# Patient Record
Sex: Male | Born: 1950 | Race: White | Hispanic: No | Marital: Married | Smoking: Never smoker
Health system: Southern US, Community
[De-identification: ages and names within clinical notes are randomized; demographics above are authoritative.]

## PROBLEM LIST (undated history)

## (undated) DIAGNOSIS — S069X9A Unspecified intracranial injury with loss of consciousness of unspecified duration, initial encounter: Secondary | ICD-10-CM

## (undated) DIAGNOSIS — H209 Unspecified iridocyclitis: Secondary | ICD-10-CM

## (undated) DIAGNOSIS — N138 Other obstructive and reflux uropathy: Principal | ICD-10-CM

## (undated) DIAGNOSIS — G3184 Mild cognitive impairment, so stated: Secondary | ICD-10-CM

## (undated) DIAGNOSIS — I219 Acute myocardial infarction, unspecified: Secondary | ICD-10-CM

## (undated) DIAGNOSIS — E782 Mixed hyperlipidemia: Secondary | ICD-10-CM

## (undated) DIAGNOSIS — N529 Male erectile dysfunction, unspecified: Secondary | ICD-10-CM

## (undated) DIAGNOSIS — Z79899 Other long term (current) drug therapy: Secondary | ICD-10-CM

## (undated) DIAGNOSIS — R41844 Frontal lobe and executive function deficit: Secondary | ICD-10-CM

## (undated) DIAGNOSIS — R6882 Decreased libido: Secondary | ICD-10-CM

## (undated) DIAGNOSIS — F068 Other specified mental disorders due to known physiological condition: Secondary | ICD-10-CM

## (undated) DIAGNOSIS — N509 Disorder of male genital organs, unspecified: Secondary | ICD-10-CM

## (undated) DIAGNOSIS — M659 Unspecified synovitis and tenosynovitis, unspecified site: Secondary | ICD-10-CM

## (undated) DIAGNOSIS — H2013 Chronic iridocyclitis, bilateral: Secondary | ICD-10-CM

## (undated) DIAGNOSIS — N508 Other specified disorders of male genital organs: Secondary | ICD-10-CM

## (undated) DIAGNOSIS — R569 Unspecified convulsions: Secondary | ICD-10-CM

## (undated) DIAGNOSIS — I7300 Raynaud's syndrome without gangrene: Secondary | ICD-10-CM

## (undated) DIAGNOSIS — S069X0S Unspecified intracranial injury without loss of consciousness, sequela: Secondary | ICD-10-CM

## (undated) DIAGNOSIS — R0600 Dyspnea, unspecified: Secondary | ICD-10-CM

## (undated) DIAGNOSIS — N400 Enlarged prostate without lower urinary tract symptoms: Secondary | ICD-10-CM

## (undated) DIAGNOSIS — H338 Other retinal detachments: Secondary | ICD-10-CM

## (undated) DIAGNOSIS — K219 Gastro-esophageal reflux disease without esophagitis: Secondary | ICD-10-CM

## (undated) DIAGNOSIS — F329 Major depressive disorder, single episode, unspecified: Secondary | ICD-10-CM

## (undated) DIAGNOSIS — E785 Hyperlipidemia, unspecified: Secondary | ICD-10-CM

## (undated) DIAGNOSIS — R296 Repeated falls: Secondary | ICD-10-CM

## (undated) DIAGNOSIS — I2510 Atherosclerotic heart disease of native coronary artery without angina pectoris: Secondary | ICD-10-CM

## (undated) DIAGNOSIS — I10 Essential (primary) hypertension: Secondary | ICD-10-CM

## (undated) DIAGNOSIS — N411 Chronic prostatitis: Secondary | ICD-10-CM

## (undated) DIAGNOSIS — E291 Testicular hypofunction: Secondary | ICD-10-CM

## (undated) DIAGNOSIS — I639 Cerebral infarction, unspecified: Secondary | ICD-10-CM

## (undated) DIAGNOSIS — F0390 Unspecified dementia without behavioral disturbance: Secondary | ICD-10-CM

## (undated) DIAGNOSIS — M25512 Pain in left shoulder: Secondary | ICD-10-CM

## (undated) DIAGNOSIS — M419 Scoliosis, unspecified: Secondary | ICD-10-CM

## (undated) DIAGNOSIS — S069XAA Unspecified intracranial injury with loss of consciousness status unknown, initial encounter: Secondary | ICD-10-CM

## (undated) DIAGNOSIS — E349 Endocrine disorder, unspecified: Secondary | ICD-10-CM

## (undated) DIAGNOSIS — H9190 Unspecified hearing loss, unspecified ear: Secondary | ICD-10-CM

## (undated) DIAGNOSIS — I509 Heart failure, unspecified: Secondary | ICD-10-CM

## (undated) DIAGNOSIS — R519 Headache, unspecified: Secondary | ICD-10-CM

## (undated) DIAGNOSIS — R51 Headache: Secondary | ICD-10-CM

## (undated) DIAGNOSIS — S0630AA Unspecified focal traumatic brain injury with loss of consciousness status unknown, initial encounter: Secondary | ICD-10-CM

## (undated) DIAGNOSIS — F339 Major depressive disorder, recurrent, unspecified: Secondary | ICD-10-CM

## (undated) DIAGNOSIS — Z961 Presence of intraocular lens: Secondary | ICD-10-CM

## (undated) DIAGNOSIS — F845 Asperger's syndrome: Secondary | ICD-10-CM

## (undated) DIAGNOSIS — F429 Obsessive-compulsive disorder, unspecified: Secondary | ICD-10-CM

## (undated) DIAGNOSIS — N401 Enlarged prostate with lower urinary tract symptoms: Principal | ICD-10-CM

## (undated) DIAGNOSIS — F32A Depression, unspecified: Secondary | ICD-10-CM

## (undated) DIAGNOSIS — G8929 Other chronic pain: Secondary | ICD-10-CM

## (undated) HISTORY — PX: OTHER SURGICAL HISTORY: SHX169

## (undated) HISTORY — DX: Chronic iridocyclitis, bilateral: H20.13

## (undated) HISTORY — DX: Major depressive disorder, single episode, unspecified: F32.9

## (undated) HISTORY — DX: Hyperlipidemia, unspecified: E78.5

## (undated) HISTORY — DX: Unspecified iridocyclitis: H20.9

## (undated) HISTORY — DX: Presence of intraocular lens: Z96.1

## (undated) HISTORY — PX: FOOT SURGERY: SHX648

## (undated) HISTORY — DX: Endocrine disorder, unspecified: E34.9

## (undated) HISTORY — DX: Disorder of male genital organs, unspecified: N50.9

## (undated) HISTORY — DX: Chronic prostatitis: N41.1

## (undated) HISTORY — DX: Other obstructive and reflux uropathy: N13.8

## (undated) HISTORY — DX: Other chronic pain: G89.29

## (undated) HISTORY — DX: Other specified disorders of male genital organs: N50.8

## (undated) HISTORY — DX: Other specified mental disorders due to known physiological condition: F06.8

## (undated) HISTORY — DX: Pain in left shoulder: M25.512

## (undated) HISTORY — DX: Acute myocardial infarction, unspecified: I21.9

## (undated) HISTORY — DX: Testicular hypofunction: E29.1

## (undated) HISTORY — PX: EYE SURGERY: SHX253

## (undated) HISTORY — DX: Decreased libido: R68.82

## (undated) HISTORY — DX: Gastro-esophageal reflux disease without esophagitis: K21.9

## (undated) HISTORY — DX: Frontal lobe and executive function deficit: R41.844

## (undated) HISTORY — DX: Essential (primary) hypertension: I10

## (undated) HISTORY — DX: Other long term (current) drug therapy: Z79.899

## (undated) HISTORY — DX: Unspecified hearing loss, unspecified ear: H91.90

## (undated) HISTORY — DX: Depression, unspecified: F32.A

## (undated) HISTORY — DX: Unspecified dementia, unspecified severity, without behavioral disturbance, psychotic disturbance, mood disturbance, and anxiety: F03.90

## (undated) HISTORY — DX: Mixed hyperlipidemia: E78.2

## (undated) HISTORY — DX: Atherosclerotic heart disease of native coronary artery without angina pectoris: I25.10

## (undated) HISTORY — PX: CORONARY ANGIOPLASTY: SHX604

## (undated) HISTORY — DX: Unspecified intracranial injury without loss of consciousness, sequela: S06.9X0S

## (undated) HISTORY — DX: Other retinal detachments: H33.8

## (undated) HISTORY — DX: Major depressive disorder, recurrent, unspecified: F33.9

## (undated) HISTORY — DX: Benign prostatic hyperplasia with lower urinary tract symptoms: N40.1

## (undated) HISTORY — DX: Male erectile dysfunction, unspecified: N52.9

---
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
## 1982-04-21 HISTORY — PX: CYSTOSCOPY: SUR368

## 2002-04-21 HISTORY — PX: SHOULDER SURGERY: SHX246

## 2004-05-17 ENCOUNTER — Emergency Department: Payer: Self-pay | Admitting: Emergency Medicine

## 2004-11-25 ENCOUNTER — Ambulatory Visit: Payer: Self-pay | Admitting: Ophthalmology

## 2005-02-10 ENCOUNTER — Ambulatory Visit: Payer: Self-pay | Admitting: Unknown Physician Specialty

## 2005-08-20 ENCOUNTER — Ambulatory Visit: Payer: Self-pay | Admitting: Gastroenterology

## 2006-04-21 HISTORY — PX: TONSILLECTOMY: SUR1361

## 2007-04-22 HISTORY — PX: TOE SURGERY: SHX1073

## 2009-04-21 HISTORY — PX: BACK SURGERY: SHX140

## 2009-12-31 ENCOUNTER — Ambulatory Visit: Payer: Self-pay | Admitting: Physical Medicine & Rehabilitation

## 2010-01-02 ENCOUNTER — Inpatient Hospital Stay (HOSPITAL_COMMUNITY): Admission: EM | Admit: 2010-01-02 | Discharge: 2010-01-10 | Payer: Self-pay

## 2010-01-07 ENCOUNTER — Ambulatory Visit: Payer: Self-pay | Admitting: Physical Medicine & Rehabilitation

## 2010-01-10 ENCOUNTER — Inpatient Hospital Stay (HOSPITAL_COMMUNITY)
Admission: RE | Admit: 2010-01-10 | Discharge: 2010-01-23 | Payer: Self-pay | Admitting: Physical Medicine & Rehabilitation

## 2010-01-23 ENCOUNTER — Encounter: Payer: Self-pay | Admitting: Internal Medicine

## 2010-02-12 ENCOUNTER — Encounter
Admission: RE | Admit: 2010-02-12 | Discharge: 2010-02-12 | Payer: Self-pay | Source: Home / Self Care | Admitting: Neurosurgery

## 2010-02-26 ENCOUNTER — Encounter
Admission: RE | Admit: 2010-02-26 | Discharge: 2010-04-05 | Payer: Self-pay | Source: Home / Self Care | Attending: Physical Medicine & Rehabilitation | Admitting: Physical Medicine & Rehabilitation

## 2010-03-05 ENCOUNTER — Ambulatory Visit: Payer: Self-pay | Admitting: Neurosurgery

## 2010-04-05 ENCOUNTER — Ambulatory Visit: Payer: Self-pay | Admitting: Physical Medicine & Rehabilitation

## 2010-04-19 ENCOUNTER — Ambulatory Visit: Payer: Self-pay | Admitting: Neurosurgery

## 2010-05-10 ENCOUNTER — Encounter
Admission: RE | Admit: 2010-05-10 | Discharge: 2010-05-21 | Payer: Self-pay | Source: Home / Self Care | Attending: Physical Medicine & Rehabilitation | Admitting: Physical Medicine & Rehabilitation

## 2010-05-15 ENCOUNTER — Encounter: Payer: Self-pay | Admitting: Neurosurgery

## 2010-05-17 ENCOUNTER — Ambulatory Visit: Admit: 2010-05-17 | Payer: Self-pay | Admitting: Physical Medicine & Rehabilitation

## 2010-05-17 ENCOUNTER — Ambulatory Visit
Admission: RE | Admit: 2010-05-17 | Discharge: 2010-05-17 | Payer: Self-pay | Source: Home / Self Care | Attending: Physical Medicine & Rehabilitation | Admitting: Physical Medicine & Rehabilitation

## 2010-05-22 ENCOUNTER — Encounter: Payer: Self-pay | Admitting: Neurosurgery

## 2010-06-20 ENCOUNTER — Encounter: Payer: Self-pay | Admitting: Neurosurgery

## 2010-07-04 LAB — COMPREHENSIVE METABOLIC PANEL
Albumin: 2.6 g/dL — ABNORMAL LOW (ref 3.5–5.2)
BUN: 7 mg/dL (ref 6–23)
Creatinine, Ser: 0.73 mg/dL (ref 0.4–1.5)
Total Protein: 5.8 g/dL — ABNORMAL LOW (ref 6.0–8.3)

## 2010-07-04 LAB — CULTURE, BLOOD (ROUTINE X 2)
Culture  Setup Time: 201109191738
Culture: NO GROWTH

## 2010-07-04 LAB — CARDIAC PANEL(CRET KIN+CKTOT+MB+TROPI)
CK, MB: 21.4 ng/mL (ref 0.3–4.0)
Relative Index: 1.9 (ref 0.0–2.5)
Troponin I: 0.02 ng/mL (ref 0.00–0.06)

## 2010-07-04 LAB — CBC
HCT: 24.5 % — ABNORMAL LOW (ref 39.0–52.0)
HCT: 25.5 % — ABNORMAL LOW (ref 39.0–52.0)
Hemoglobin: 11 g/dL — ABNORMAL LOW (ref 13.0–17.0)
Hemoglobin: 8.5 g/dL — ABNORMAL LOW (ref 13.0–17.0)
Hemoglobin: 8.6 g/dL — ABNORMAL LOW (ref 13.0–17.0)
MCH: 30.5 pg (ref 26.0–34.0)
MCH: 31.1 pg (ref 26.0–34.0)
MCH: 31.7 pg (ref 26.0–34.0)
MCHC: 33.2 g/dL (ref 30.0–36.0)
MCHC: 34 g/dL (ref 30.0–36.0)
MCHC: 34.5 g/dL (ref 30.0–36.0)
MCHC: 34.6 g/dL (ref 30.0–36.0)
MCV: 88.3 fL (ref 78.0–100.0)
MCV: 91.1 fL (ref 78.0–100.0)
MCV: 93.1 fL (ref 78.0–100.0)
MCV: 93.4 fL (ref 78.0–100.0)
Platelets: 182 10*3/uL (ref 150–400)
Platelets: 211 10*3/uL (ref 150–400)
Platelets: 480 10*3/uL — ABNORMAL HIGH (ref 150–400)
RBC: 2.73 MIL/uL — ABNORMAL LOW (ref 4.22–5.81)
RBC: 3.47 MIL/uL — ABNORMAL LOW (ref 4.22–5.81)
RBC: 3.64 MIL/uL — ABNORMAL LOW (ref 4.22–5.81)
RDW: 12.7 % (ref 11.5–15.5)
RDW: 12.9 % (ref 11.5–15.5)
RDW: 13 % (ref 11.5–15.5)
RDW: 13.4 % (ref 11.5–15.5)
WBC: 7.3 10*3/uL (ref 4.0–10.5)
WBC: 7.7 10*3/uL (ref 4.0–10.5)
WBC: 8.6 10*3/uL (ref 4.0–10.5)

## 2010-07-04 LAB — URINALYSIS, ROUTINE W REFLEX MICROSCOPIC
Bilirubin Urine: NEGATIVE
Glucose, UA: NEGATIVE mg/dL
Hgb urine dipstick: NEGATIVE
Ketones, ur: NEGATIVE mg/dL
Protein, ur: NEGATIVE mg/dL
Protein, ur: NEGATIVE mg/dL
Specific Gravity, Urine: 1.022 (ref 1.005–1.030)
Urobilinogen, UA: 1 mg/dL (ref 0.0–1.0)
pH: 6 (ref 5.0–8.0)

## 2010-07-04 LAB — BASIC METABOLIC PANEL
BUN: 7 mg/dL (ref 6–23)
CO2: 30 mEq/L (ref 19–32)
Calcium: 8.1 mg/dL — ABNORMAL LOW (ref 8.4–10.5)
Chloride: 107 mEq/L (ref 96–112)
Creatinine, Ser: 0.83 mg/dL (ref 0.4–1.5)
Creatinine, Ser: 0.89 mg/dL (ref 0.4–1.5)
Creatinine, Ser: 0.96 mg/dL (ref 0.4–1.5)
GFR calc Af Amer: >60 mL/min (ref >60)
GFR calc non Af Amer: >60 mL/min (ref >60)
GFR calc non Af Amer: >60 mL/min (ref >60)
Glucose, Bld: 112 mg/dL — ABNORMAL HIGH (ref 70–99)
Glucose, Bld: 139 mg/dL — ABNORMAL HIGH (ref 70–99)
Potassium: 3.9 mEq/L (ref 3.5–5.1)

## 2010-07-04 LAB — DIFFERENTIAL
Basophils Relative: 1 % (ref 0–1)
Eosinophils Absolute: 0.4 10*3/uL (ref 0.0–0.7)
Eosinophils Relative: 5 % (ref 0–5)
Lymphocytes Relative: 19 % (ref 12–46)
Neutrophils Relative %: 59 % (ref 43–77)

## 2010-07-04 LAB — URINALYSIS, MICROSCOPIC ONLY
Glucose, UA: NEGATIVE mg/dL
Ketones, ur: NEGATIVE mg/dL
Leukocytes, UA: NEGATIVE
pH: 5.5 (ref 5.0–8.0)

## 2010-07-04 LAB — LIPASE, BLOOD: Lipase: 24 U/L (ref 11–59)

## 2010-07-04 LAB — URINE CULTURE
Colony Count: NO GROWTH
Culture  Setup Time: 201110031358
Culture: NO GROWTH

## 2010-07-04 LAB — POCT I-STAT 4, (NA,K, GLUC, HGB,HCT)
Glucose, Bld: 124 mg/dL — ABNORMAL HIGH (ref 70–99)
Potassium: 4.3 meq/L (ref 3.5–5.1)
Sodium: 141 meq/L (ref 135–145)

## 2010-07-04 LAB — MRSA PCR SCREENING: MRSA by PCR: NEGATIVE

## 2010-07-04 LAB — PATHOLOGIST SMEAR REVIEW

## 2011-06-01 IMAGING — CR DG FEMUR 2+V*R*
4 series · 4 of 4 positions shown · non-contrast
Comparison: None.

CLINICAL DATA: Traumatic brain injury.  Evaluate for fracture.

RIGHT FEMUR - 2 VIEW

[t femur with hip  ap right]
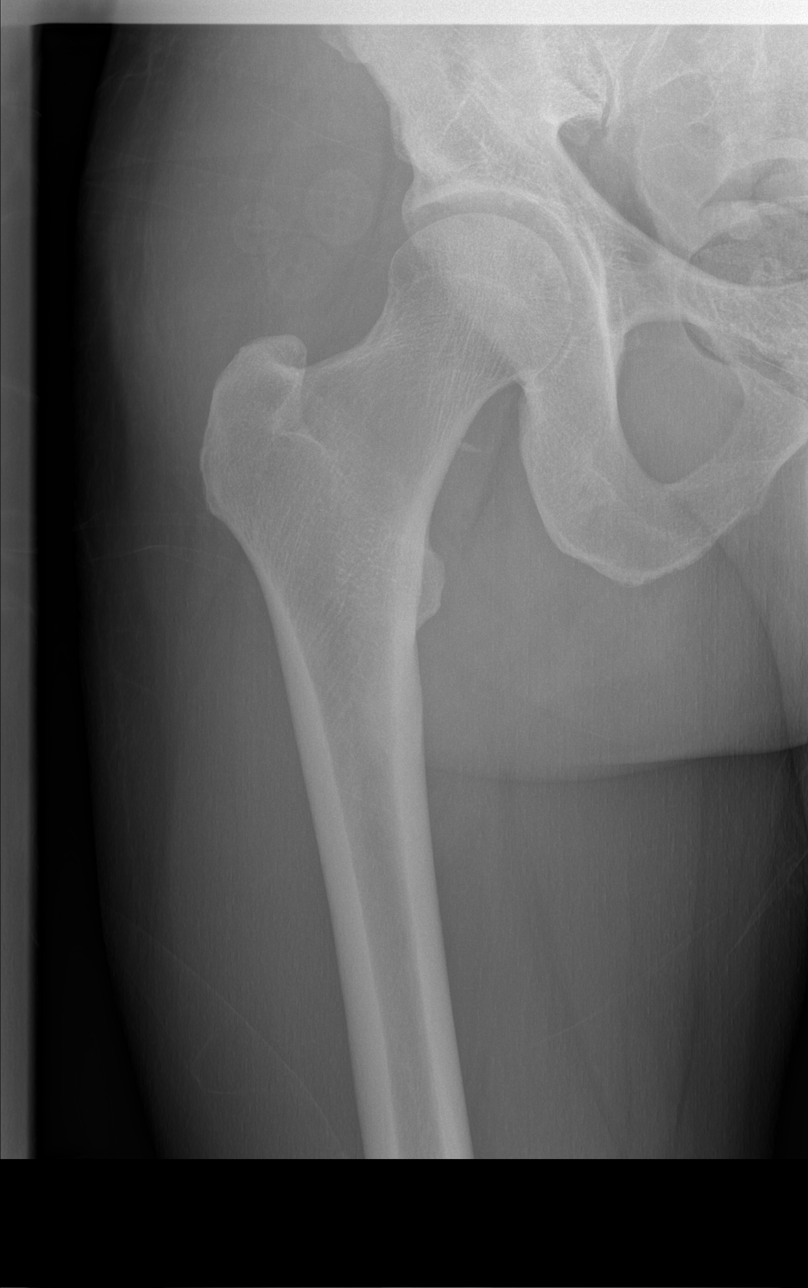

[t femur with knee ap right]
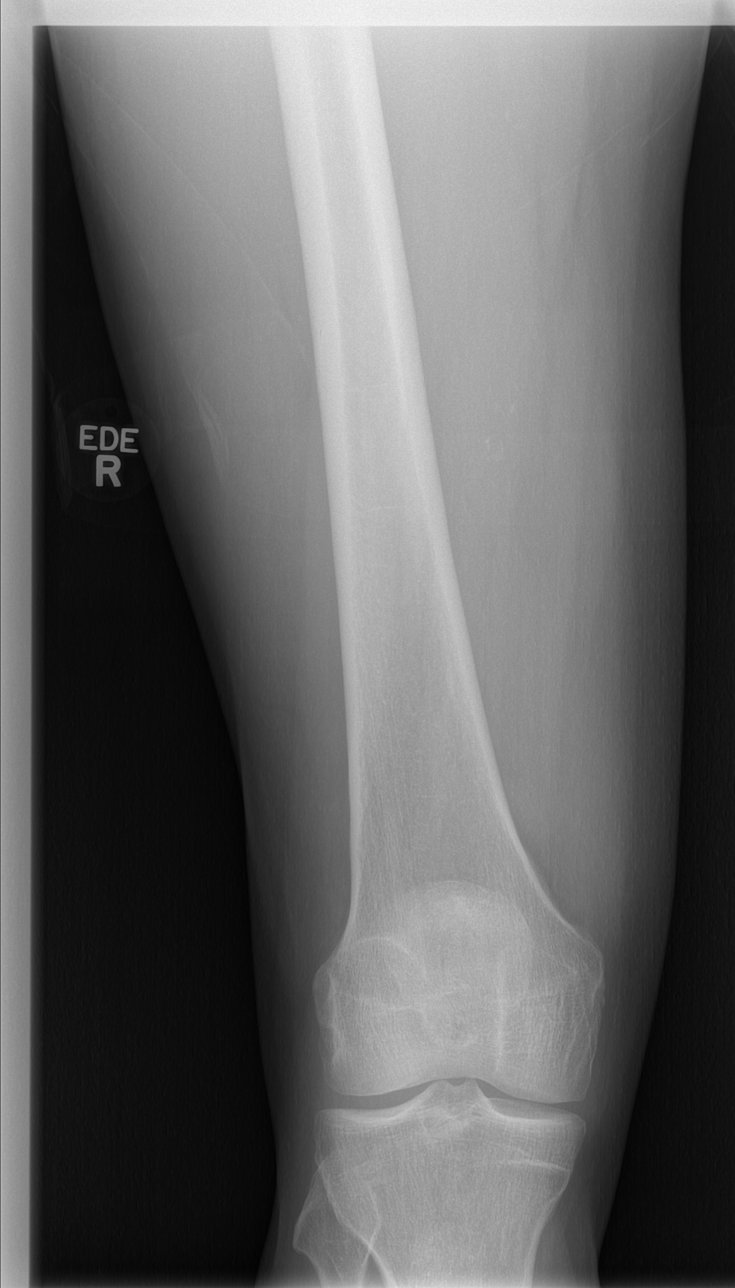

[t femur with hip lat right]
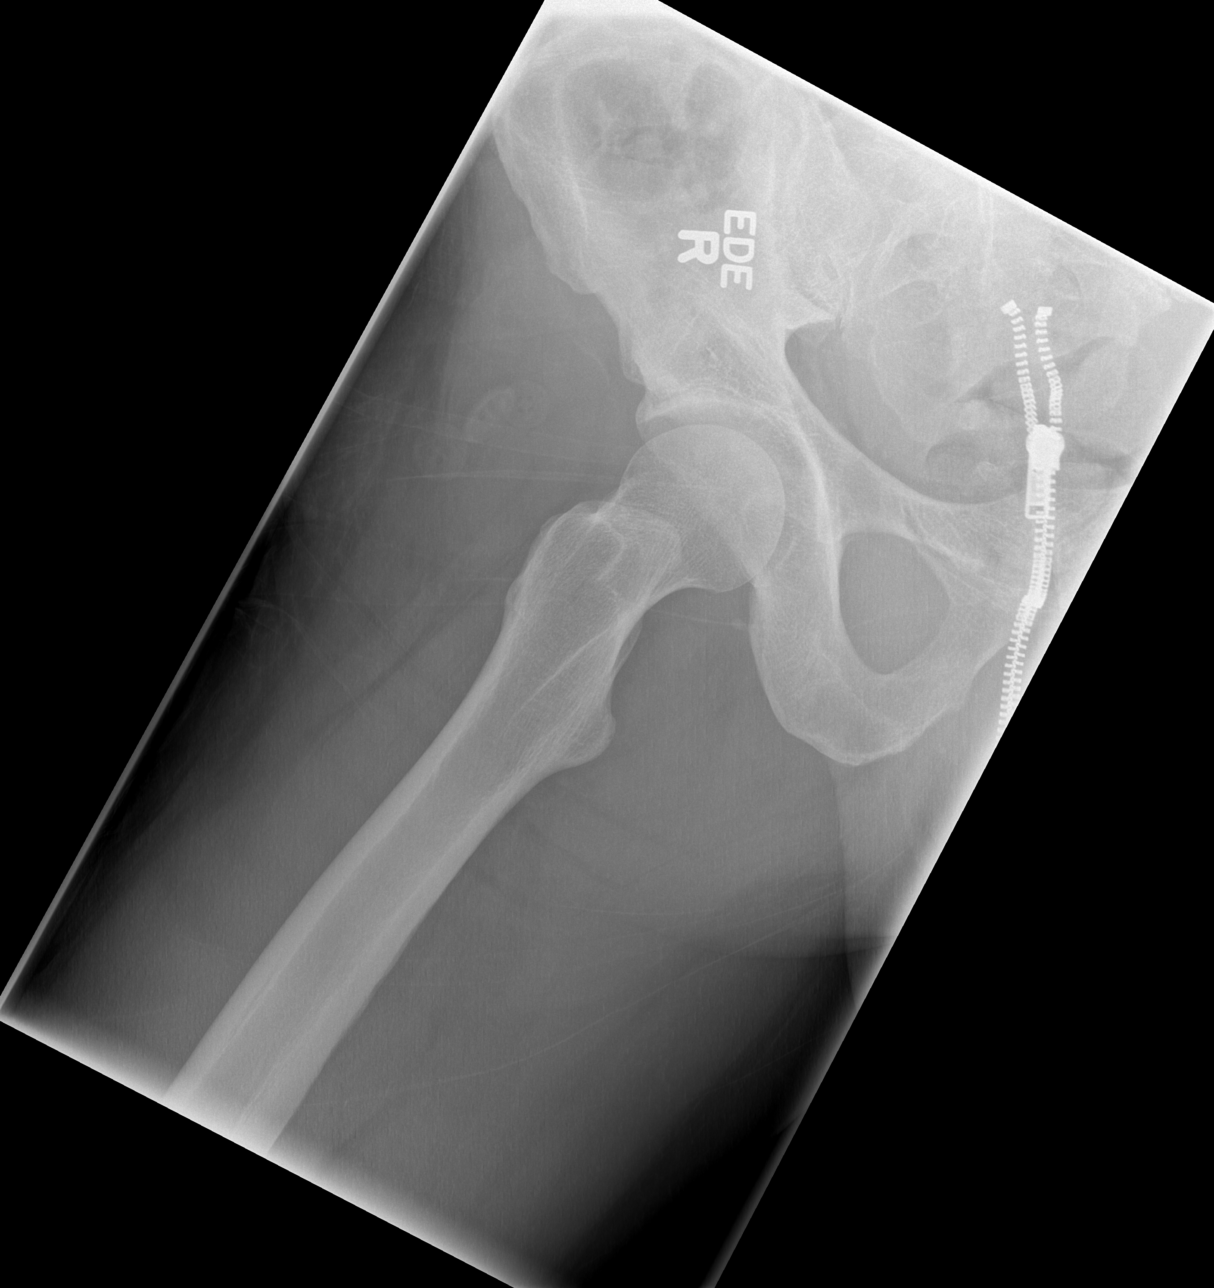

[t femur with knee lat right]
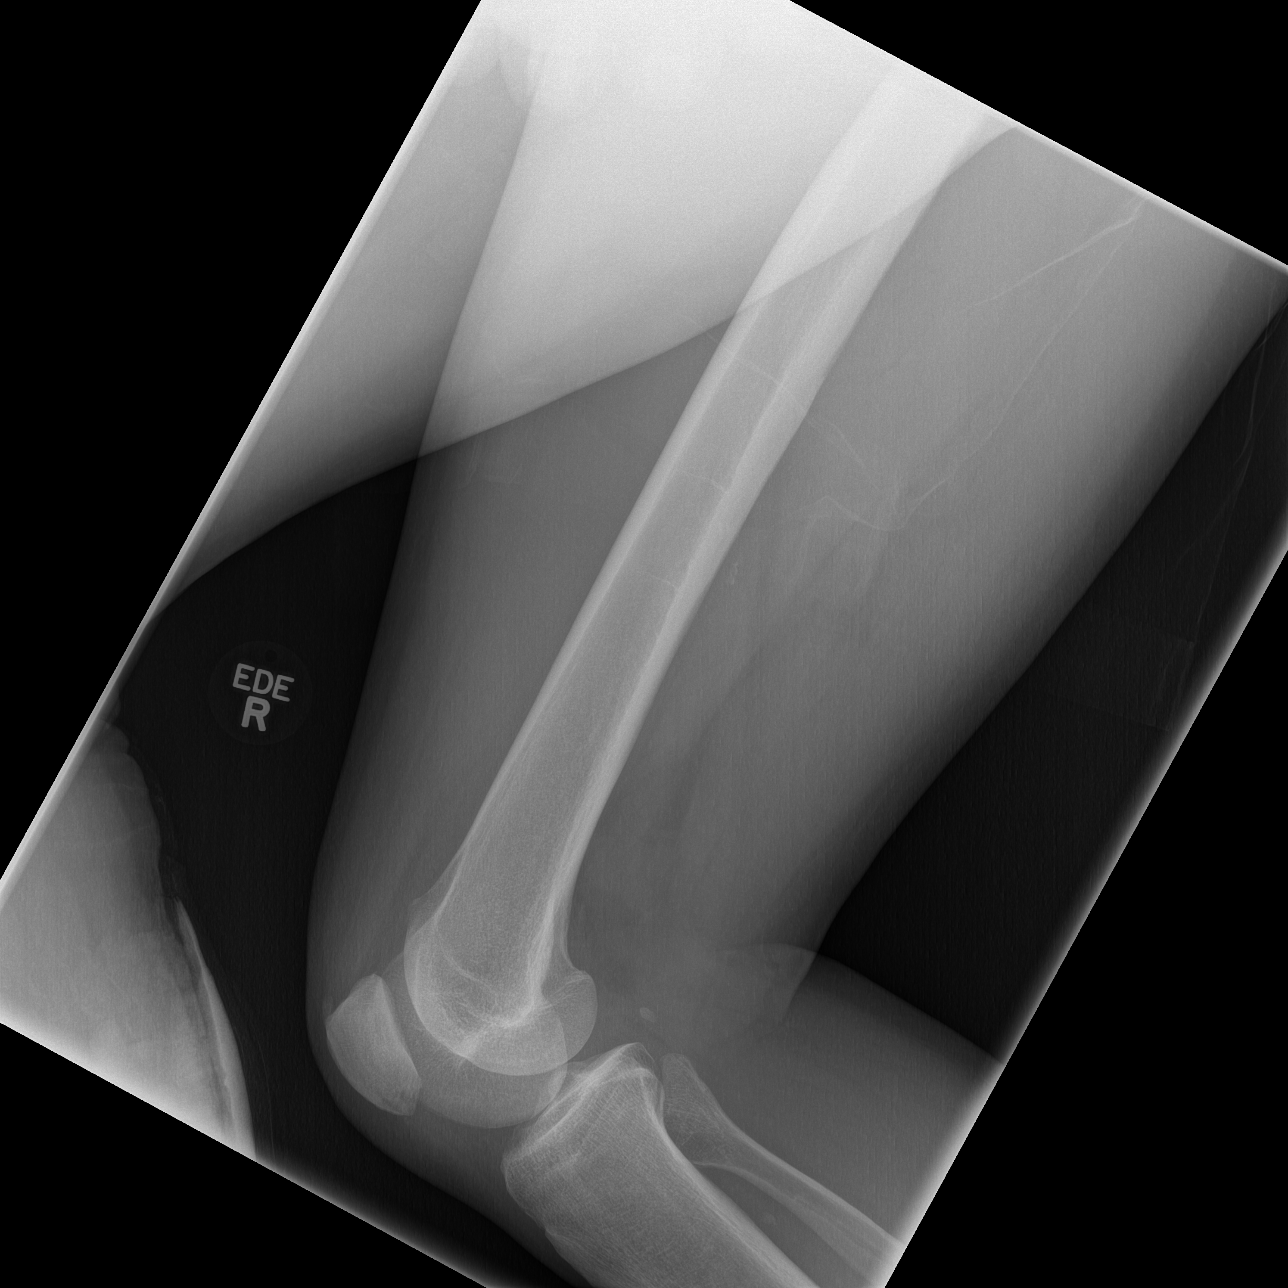

[4 of 4 positions shown; findings below may reference images not displayed]

FINDINGS: There is no evidence of acute fracture or malalignment.
The visualized portions of the knee, pelvis and hip are normal.  No
radiopaque foreign bodies are seen in the soft tissues.
IMPRESSION: No acute fracture.

## 2011-06-06 IMAGING — CR DG OS CALCIS 2+V*L*
2 series · 2 of 2 positions shown · non-contrast
Comparison: 01/02/2010

CLINICAL DATA: Follow up of calcaneus fracture.

LEFT OS CALCIS - 2+ VIEW

[t calcaneus lat left]
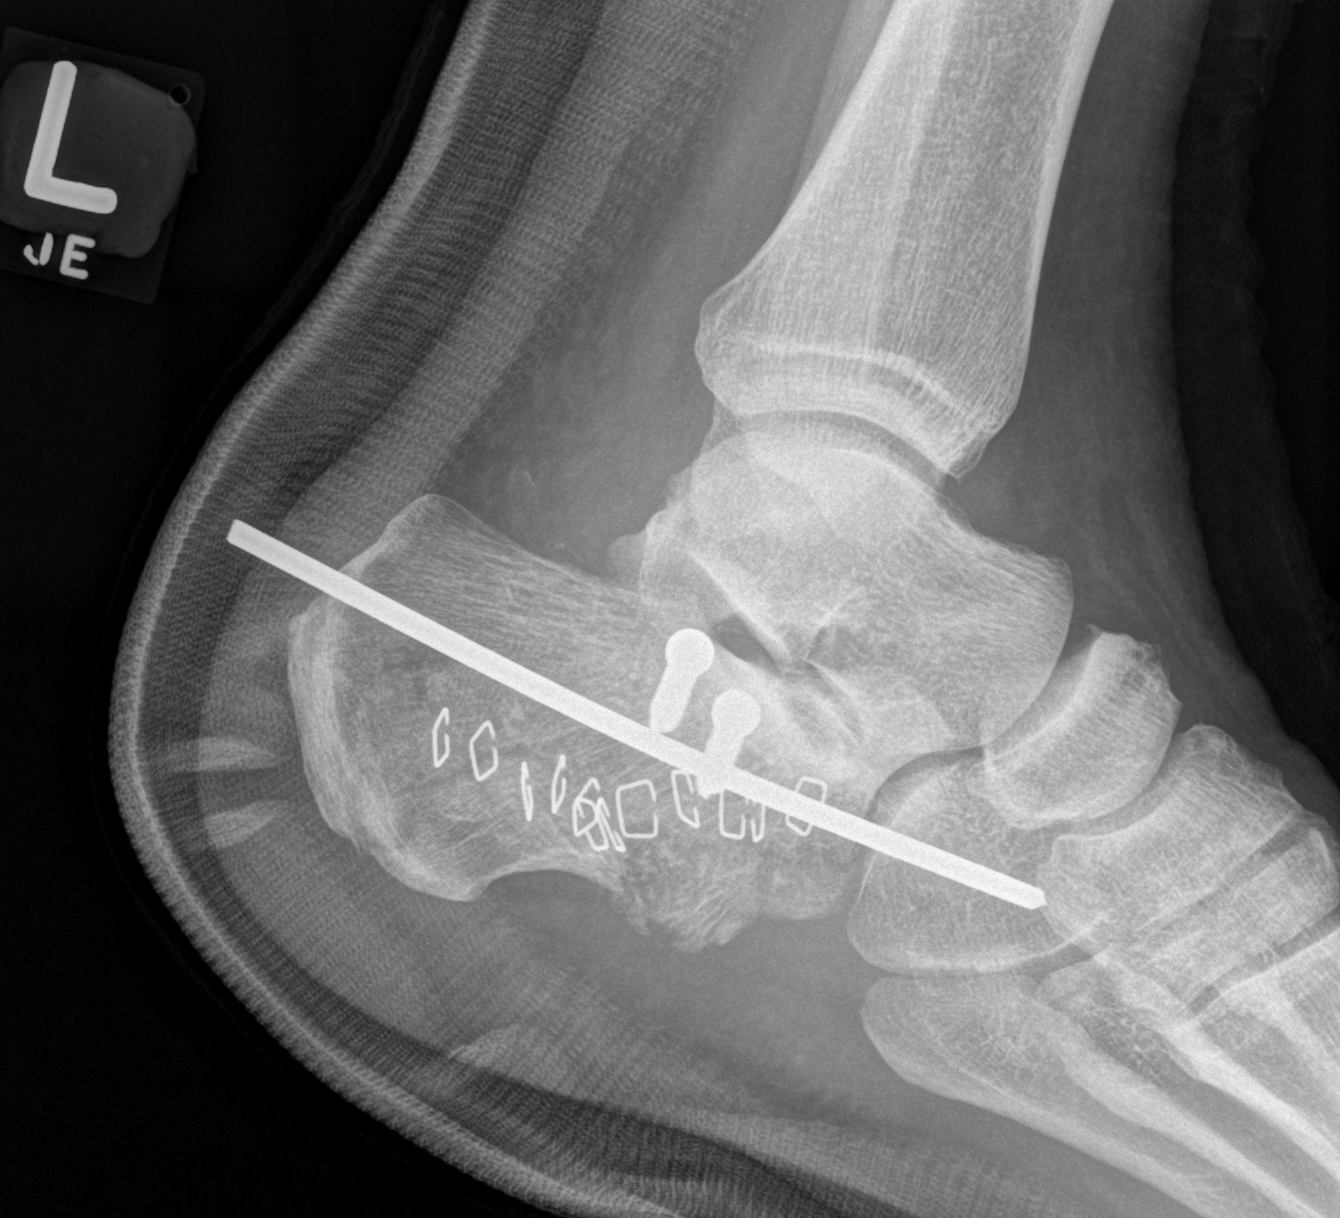

[t calcaneus axial left *]
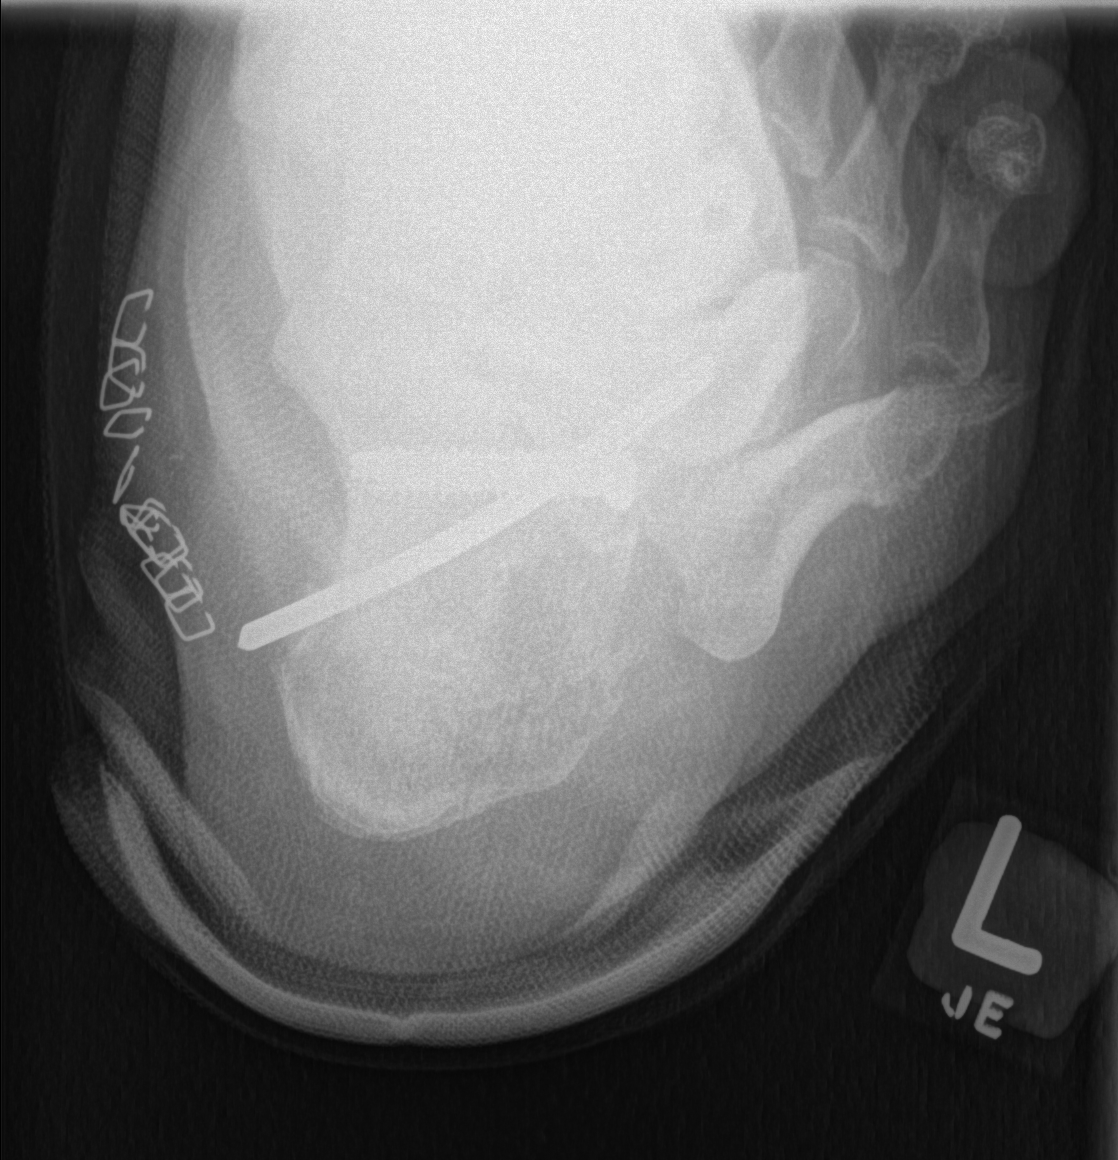

[2 of 2 positions shown; findings below may reference images not displayed]

FINDINGS: Screw and nail fixation of the previously described
comminuted intra-articular calcaneus fracture.  Decreased
visualization of fracture fragments, consistent with interval
partial healing.  Alignment is improved.  The talar dome
osteochondral lesion is possibly visualized posteriorly on the
lateral view.  No acute hardware complication.
IMPRESSION: Healing comminuted calcaneus fracture, status post fixation.

## 2012-09-08 ENCOUNTER — Other Ambulatory Visit: Payer: Self-pay | Admitting: Family Medicine

## 2012-09-08 LAB — CK-MB: CK-MB: 3.8 ng/mL — ABNORMAL HIGH (ref 0.5–3.6)

## 2013-01-11 DIAGNOSIS — K219 Gastro-esophageal reflux disease without esophagitis: Secondary | ICD-10-CM | POA: Insufficient documentation

## 2013-04-21 HISTORY — PX: COLONOSCOPY: SHX174

## 2013-06-14 ENCOUNTER — Ambulatory Visit: Payer: Self-pay | Admitting: Gastroenterology

## 2013-07-11 DIAGNOSIS — N401 Enlarged prostate with lower urinary tract symptoms: Principal | ICD-10-CM

## 2013-07-11 DIAGNOSIS — N529 Male erectile dysfunction, unspecified: Secondary | ICD-10-CM

## 2013-07-11 DIAGNOSIS — N411 Chronic prostatitis: Secondary | ICD-10-CM

## 2013-07-11 DIAGNOSIS — E291 Testicular hypofunction: Secondary | ICD-10-CM

## 2013-07-11 DIAGNOSIS — E349 Endocrine disorder, unspecified: Secondary | ICD-10-CM

## 2013-07-11 DIAGNOSIS — R6882 Decreased libido: Secondary | ICD-10-CM | POA: Insufficient documentation

## 2013-07-11 DIAGNOSIS — N138 Other obstructive and reflux uropathy: Secondary | ICD-10-CM

## 2013-07-11 DIAGNOSIS — N509 Disorder of male genital organs, unspecified: Secondary | ICD-10-CM | POA: Insufficient documentation

## 2013-07-11 DIAGNOSIS — N508 Other specified disorders of male genital organs: Secondary | ICD-10-CM

## 2013-07-11 HISTORY — DX: Benign prostatic hyperplasia with lower urinary tract symptoms: N13.8

## 2013-07-11 HISTORY — DX: Chronic prostatitis: N41.1

## 2013-07-11 HISTORY — DX: Disorder of male genital organs, unspecified: N50.9

## 2013-07-11 HISTORY — DX: Decreased libido: R68.82

## 2013-07-11 HISTORY — DX: Testicular hypofunction: E29.1

## 2013-07-11 HISTORY — DX: Other specified disorders of male genital organs: N50.8

## 2013-07-11 HISTORY — DX: Endocrine disorder, unspecified: E34.9

## 2013-07-11 HISTORY — DX: Male erectile dysfunction, unspecified: N52.9

## 2013-08-09 ENCOUNTER — Ambulatory Visit: Payer: Self-pay | Admitting: Gastroenterology

## 2013-08-19 DIAGNOSIS — I219 Acute myocardial infarction, unspecified: Secondary | ICD-10-CM

## 2013-08-19 HISTORY — DX: Acute myocardial infarction, unspecified: I21.9

## 2013-08-23 ENCOUNTER — Ambulatory Visit: Payer: Self-pay | Admitting: Gastroenterology

## 2013-09-05 ENCOUNTER — Encounter: Payer: Self-pay | Admitting: *Deleted

## 2013-09-12 ENCOUNTER — Inpatient Hospital Stay: Payer: Self-pay | Admitting: Specialist

## 2013-09-12 LAB — COMPREHENSIVE METABOLIC PANEL WITH GFR
Albumin: 4.2 g/dL
Alkaline Phosphatase: 94 U/L
Anion Gap: 9
BUN: 10 mg/dL
Bilirubin,Total: 0.8 mg/dL
Calcium, Total: 9.1 mg/dL
Chloride: 108 mmol/L — ABNORMAL HIGH
Co2: 24 mmol/L
Creatinine: 1.45 mg/dL — ABNORMAL HIGH
EGFR (African American): 58 — ABNORMAL LOW
EGFR (Non-African Amer.): 50 — ABNORMAL LOW
Glucose: 164 mg/dL — ABNORMAL HIGH
Osmolality: 284
Potassium: 3.8 mmol/L
SGOT(AST): 38 U/L — ABNORMAL HIGH
SGPT (ALT): 35 U/L
Sodium: 141 mmol/L
Total Protein: 7.3 g/dL

## 2013-09-12 LAB — CBC
HCT: 47.6 %
HGB: 15.7 g/dL
MCH: 31.1 pg
MCHC: 33 g/dL
MCV: 94 fL
Platelet: 258 x10 3/mm 3
RBC: 5.05 x10 6/mm 3
RDW: 13 %
WBC: 8.1 x10 3/mm 3

## 2013-09-12 LAB — APTT: Activated PTT: 29.2 secs (ref 23.6–35.9)

## 2013-09-12 LAB — PROTIME-INR
INR: 0.9
Prothrombin Time: 12.5 s

## 2013-09-12 LAB — TROPONIN I
Troponin-I: 0.04 ng/mL
Troponin-I: 1.5 ng/mL — ABNORMAL HIGH

## 2013-09-12 LAB — CK TOTAL AND CKMB (NOT AT ARMC)
CK, TOTAL: 270 U/L
CK-MB: 6.4 ng/mL — ABNORMAL HIGH (ref 0.5–3.6)

## 2013-09-12 LAB — CK-MB: CK-MB: 18.3 ng/mL — ABNORMAL HIGH (ref 0.5–3.6)

## 2013-09-13 DIAGNOSIS — R079 Chest pain, unspecified: Secondary | ICD-10-CM

## 2013-09-13 LAB — COMPREHENSIVE METABOLIC PANEL
ALT: 35 U/L (ref 12–78)
Albumin: 3.6 g/dL (ref 3.4–5.0)
Alkaline Phosphatase: 77 U/L
Anion Gap: 5 — ABNORMAL LOW (ref 7–16)
BUN: 8 mg/dL (ref 7–18)
Bilirubin,Total: 0.7 mg/dL (ref 0.2–1.0)
CALCIUM: 8.4 mg/dL — AB (ref 8.5–10.1)
CO2: 28 mmol/L (ref 21–32)
CREATININE: 1.17 mg/dL (ref 0.60–1.30)
Chloride: 108 mmol/L — ABNORMAL HIGH (ref 98–107)
EGFR (African American): >60
EGFR (Non-African Amer.): >60
Glucose: 102 mg/dL — ABNORMAL HIGH (ref 65–99)
Osmolality: 280 (ref 275–301)
Potassium: 3.8 mmol/L (ref 3.5–5.1)
SGOT(AST): 66 U/L — ABNORMAL HIGH (ref 15–37)
SODIUM: 141 mmol/L (ref 136–145)
Total Protein: 6.6 g/dL (ref 6.4–8.2)

## 2013-09-13 LAB — CBC WITH DIFFERENTIAL/PLATELET
Basophil #: 0.1 10*3/uL (ref 0.0–0.1)
Basophil %: 0.9 %
Eosinophil #: 0.1 10*3/uL (ref 0.0–0.7)
Eosinophil %: 1.4 %
HCT: 44.1 % (ref 40.0–52.0)
HGB: 14.6 g/dL (ref 13.0–18.0)
LYMPHS ABS: 1.9 10*3/uL (ref 1.0–3.6)
LYMPHS PCT: 24.5 %
MCH: 31.2 pg (ref 26.0–34.0)
MCHC: 33.1 g/dL (ref 32.0–36.0)
MCV: 94 fL (ref 80–100)
MONO ABS: 0.8 x10 3/mm (ref 0.2–1.0)
MONOS PCT: 10.8 %
NEUTROS ABS: 4.9 10*3/uL (ref 1.4–6.5)
Neutrophil %: 62.4 %
PLATELETS: 214 10*3/uL (ref 150–440)
RBC: 4.68 10*6/uL (ref 4.40–5.90)
RDW: 13.1 % (ref 11.5–14.5)
WBC: 7.8 10*3/uL (ref 3.8–10.6)

## 2013-09-13 LAB — TROPONIN I
TROPONIN-I: 9.1 ng/mL — AB
TROPONIN-I: 14 ng/mL — AB

## 2013-09-13 LAB — CK-MB
CK-MB: 69.3 ng/mL — ABNORMAL HIGH (ref 0.5–3.6)
CK-MB: 94.1 ng/mL — ABNORMAL HIGH (ref 0.5–3.6)

## 2013-09-13 LAB — APTT
ACTIVATED PTT: 44.2 s — AB (ref 23.6–35.9)
Activated PTT: 32.6 secs (ref 23.6–35.9)

## 2013-09-13 LAB — LIPID PANEL
CHOLESTEROL: 179 mg/dL (ref 0–200)
HDL: 45 mg/dL (ref 40–60)
Ldl Cholesterol, Calc: 124 mg/dL — ABNORMAL HIGH (ref 0–100)
Triglycerides: 51 mg/dL (ref 0–200)
VLDL Cholesterol, Calc: 10 mg/dL (ref 5–40)

## 2013-09-13 LAB — CK TOTAL AND CKMB (NOT AT ARMC)
CK, TOTAL: 496 U/L — AB
CK-MB: 56.7 ng/mL — AB (ref 0.5–3.6)

## 2013-09-13 LAB — MAGNESIUM: Magnesium: 1.9 mg/dL

## 2013-09-13 LAB — TSH: Thyroid Stimulating Horm: 3.62 u[IU]/mL

## 2013-09-14 LAB — BASIC METABOLIC PANEL
ANION GAP: 6 — AB (ref 7–16)
BUN: 8 mg/dL (ref 7–18)
CALCIUM: 9 mg/dL (ref 8.5–10.1)
CREATININE: 0.95 mg/dL (ref 0.60–1.30)
Chloride: 107 mmol/L (ref 98–107)
Co2: 26 mmol/L (ref 21–32)
EGFR (African American): >60
EGFR (Non-African Amer.): >60
GLUCOSE: 117 mg/dL — AB (ref 65–99)
Osmolality: 277 (ref 275–301)
POTASSIUM: 3.7 mmol/L (ref 3.5–5.1)
Sodium: 139 mmol/L (ref 136–145)

## 2013-09-21 ENCOUNTER — Ambulatory Visit (INDEPENDENT_AMBULATORY_CARE_PROVIDER_SITE_OTHER): Payer: Medicare Other | Admitting: General Surgery

## 2013-09-21 ENCOUNTER — Encounter: Payer: Self-pay | Admitting: General Surgery

## 2013-09-21 VITALS — BP 130/90 | Ht 67.0 in | Wt 188.0 lb

## 2013-09-21 DIAGNOSIS — K4090 Unilateral inguinal hernia, without obstruction or gangrene, not specified as recurrent: Secondary | ICD-10-CM

## 2013-09-21 DIAGNOSIS — I2510 Atherosclerotic heart disease of native coronary artery without angina pectoris: Secondary | ICD-10-CM

## 2013-09-21 HISTORY — DX: Atherosclerotic heart disease of native coronary artery without angina pectoris: I25.10

## 2013-09-21 NOTE — Progress Notes (Signed)
Patient ID: Blake [name], male   DOB: 10/01/1950, 63 y.o.   MRN: [id number]  Chief Complaint  Patient presents with  . Other    inguinal hernia    HPI Blake [name] is a 63 y.o. male here today for an evaluation of bilateral inguinal hernia.Patient states he noticed the area about four months. There is no pain with activity. Pt has recently been undergoing w/u for diarrhea which has improved.  CT scan done during this w/u reportedly showed inguinal hernias. More recently few weeks ago he had an MI and had stents placed.  HPI  Past Medical History  Diagnosis Date  . Hyperlipidemia   . GERD (gastroesophageal reflux disease)   . Dementia   . Hypertension   . Myocardial infarction 08/2013  . Depression   . Hearing loss     Past Surgical History  Procedure Laterality Date  . Hernia repair Right 1969    inguinal  . Cystoscopy  1984  . Skull surgery    . Toe surgery Right 2009  . Shoulder surgery Right 2004  . Colonoscopy  2015  . Tonsillectomy  2008  . Foot surgery Left   . Spine surgery    . Eye surgery Bilateral 2005,2006,2009    History reviewed. No pertinent family history.  Social History History  Substance Use Topics  . Smoking status: Never Smoker   . Smokeless tobacco: Never Used  . Alcohol Use: Yes    No Known Allergies  Current Outpatient Prescriptions  Medication Sig Dispense Refill  . aspirin 81 MG tablet Take 81 mg by mouth daily.      Marland Kitchen. donepezil (ARICEPT) 10 MG tablet Take 10 mg by mouth at bedtime.      . DULOXETINE HCL PO Take 90 mg by mouth daily.      Marland Kitchen. FIBER PO Take 1 tablet by mouth 3 (three) times daily.      . fluticasone (VERAMYST) 27.5 MCG/SPRAY nasal spray Place 1 spray into the nose daily.      Marland Kitchen. lisinopril (PRINIVIL,ZESTRIL) 5 MG tablet Take 5 mg by mouth daily.      . metoprolol succinate (TOPROL-XL) 25 MG 24 hr tablet Take 25 mg by mouth daily.      . RABEprazole (ACIPHEX) 20 MG tablet Take 20 mg by mouth daily.      .  silodosin (RAPAFLO) 8 MG CAPS capsule Take 8 mg by mouth daily with breakfast.      . tadalafil (CIALIS) 20 MG tablet Take 20 mg by mouth daily as needed for erectile dysfunction.      Marland Kitchen. terazosin (HYTRIN) 10 MG capsule Take 10 mg by mouth at bedtime.      . Testosterone 30 MG/ACT SOLN Place onto the skin.       No current facility-administered medications for this visit.    Review of Systems Review of Systems  Constitutional: Negative.   Respiratory: Negative.   Cardiovascular: Negative.     Blood pressure 130/90, height 5\' 7"  (1.702 m), weight 188 lb (85.276 kg).  Physical Exam Physical Exam  Constitutional: He is oriented to person, place, and time. He appears well-developed and well-nourished.  Eyes: Conjunctivae are normal. No scleral icterus.  Neck: Neck supple.  Cardiovascular: Normal rate, regular rhythm and normal heart sounds.   Pulmonary/Chest: Effort normal and breath sounds normal.  Abdominal: Soft. Normal appearance and bowel sounds are normal. There is no tenderness. A hernia (Bilateral small inguinal impulse with straining-lekely direct  hernias.) is present. Hernia confirmed positive in the right inguinal area and confirmed positive in the left inguinal area.  Neurological: He is alert and oriented to person, place, and time.  Skin: Skin is warm and dry.    Data Reviewed none  Assessment    Very small bilateral direct hernias. Do not feel these require repair. Periodic follow up recommended to assess any increase in size.    Plan    Patient to return in 4 mos        Seeplaputhur Wynona Luna 09/21/2013, 7:35 PM

## 2013-09-21 NOTE — Patient Instructions (Addendum)

## 2014-01-23 ENCOUNTER — Ambulatory Visit: Payer: Self-pay | Admitting: General Surgery

## 2014-02-02 DIAGNOSIS — I10 Essential (primary) hypertension: Secondary | ICD-10-CM | POA: Insufficient documentation

## 2014-02-02 DIAGNOSIS — E782 Mixed hyperlipidemia: Secondary | ICD-10-CM

## 2014-02-02 HISTORY — DX: Mixed hyperlipidemia: E78.2

## 2014-05-22 DIAGNOSIS — I639 Cerebral infarction, unspecified: Secondary | ICD-10-CM

## 2014-05-22 HISTORY — DX: Cerebral infarction, unspecified: I63.9

## 2014-05-23 ENCOUNTER — Inpatient Hospital Stay: Payer: Self-pay | Admitting: Internal Medicine

## 2014-05-23 DIAGNOSIS — I639 Cerebral infarction, unspecified: Secondary | ICD-10-CM

## 2014-05-23 LAB — TROPONIN I: Troponin-I: <0.02 ng/mL

## 2014-05-23 LAB — COMPREHENSIVE METABOLIC PANEL
ALK PHOS: 95 U/L (ref 46–116)
ALT: 47 U/L (ref 14–63)
AST: 33 U/L (ref 15–37)
Albumin: 3.7 g/dL (ref 3.4–5.0)
Anion Gap: 8 (ref 7–16)
BUN: 13 mg/dL (ref 7–18)
Bilirubin,Total: 0.6 mg/dL (ref 0.2–1.0)
CHLORIDE: 109 mmol/L — AB (ref 98–107)
Calcium, Total: 8.5 mg/dL (ref 8.5–10.1)
Co2: 26 mmol/L (ref 21–32)
Creatinine: 0.94 mg/dL (ref 0.60–1.30)
EGFR (Non-African Amer.): >60
GLUCOSE: 95 mg/dL (ref 65–99)
Osmolality: 285 (ref 275–301)
Potassium: 4.1 mmol/L (ref 3.5–5.1)
Sodium: 143 mmol/L (ref 136–145)
TOTAL PROTEIN: 6.7 g/dL (ref 6.4–8.2)

## 2014-05-23 LAB — CK-MB
CK-MB: 3.2 ng/mL (ref 0.5–3.6)
CK-MB: 2.6 ng/mL (ref 0.5–3.6)
CK-MB: 2.3 ng/mL (ref 0.5–3.6)

## 2014-05-23 LAB — CBC
HCT: 43.3 % (ref 40.0–52.0)
HGB: 14.3 g/dL (ref 13.0–18.0)
MCH: 30.9 pg (ref 26.0–34.0)
MCHC: 33.1 g/dL (ref 32.0–36.0)
MCV: 93 fL (ref 80–100)
Platelet: 223 10*3/uL (ref 150–440)
RBC: 4.63 10*6/uL (ref 4.40–5.90)
RDW: 13.5 % (ref 11.5–14.5)
WBC: 5.2 10*3/uL (ref 3.8–10.6)

## 2014-05-23 LAB — LIPID PANEL
CHOLESTEROL: 112 mg/dL (ref 0–200)
HDL Cholesterol: 41 mg/dL (ref 40–60)
LDL CHOLESTEROL, CALC: 55 mg/dL (ref 0–100)
TRIGLYCERIDES: 81 mg/dL (ref 0–200)
VLDL CHOLESTEROL, CALC: 16 mg/dL (ref 5–40)

## 2014-05-23 LAB — PROTIME-INR
INR: 0.9
PROTHROMBIN TIME: 12.2 s

## 2014-08-12 NOTE — H&P (Signed)
PATIENT NAME:  Blake [name], Blake [name] MR#:  [id number] DATE OF BIRTH:  02-12-1951  DATE OF ADMISSION:  09/13/2013  PRIMARY CARE PHYSICIAN: Dr. Dorothey Baseman.  REFERRING PHYSICIAN: Dr. Shaune Pollack.  CHIEF COMPLAINT: Bilateral arm pain.   HISTORY OF PRESENT ILLNESS: The patient is a 64 year old Caucasian male with past medical history of hypertension, depression, GERD, benign prostatic hypertrophy, dementia, and erectile dysfunction is presenting to the ER with a chief complaint of bilateral arm pain. The patient is reporting that he has been doing some yard work for the past 7 days and he was doing fine. He lifts some weights on a daily basis as well. Today after doing yard work at around 4:00 p.m., he started having left-sided palm pain. Eventually bilaterally forearms were hurting. The patient actually went to [employer] to read a newspaper at around 4:00 p.m. and as the pain has gotten worse and went up to the bilateral shoulders, he drove to the ER directly. Initially, the pain was 8/10. He felt a little chest discomfort also. In the ER, the patient became pale and diaphoretic. No loss of consciousness. Denies any nausea, vomiting, or headache. Initial troponin was negative, but the repeat troponin and is at 1.50 and CPK-MB went up to 18.3. The patient's creatinine is at 1.45. The patient has received aspirin and morphine in the ER. Subsequently his pain was significantly improved for 15 minutes but after 15 minutes again he started having bilateral arm pains. As the patient's second troponin is elevated but with no ST-T elevations, the patient is diagnosed non STEMI. He was given heparin bolus and the patient is started on heparin drip. During my examination, the patient is resting comfortably. Denies any arm pain, shoulder pain, palm pain which has completely resolved. Denies any chest discomfort either. Never had any heart attacks in the past and not seen by any cardiologist in the past. No other complaints. Wife  is at bedside. The patient was recently diagnosed with [facility] at [facility]. He also has some hearing difficulty.   PAST MEDICAL HISTORY: Hypertension, benign prostatic hypertrophy, erectile dysfunction on Cialis which is currently kept on hold, dementia, depression, GERD.   PAST SURGICAL HISTORY: Left foot fracture repair, left tibia fracture repair, lumbar fracture repair, tonsillectomy, right hernia repair, cataract extraction, right inguinal hernia repair.   ALLERGIES: No known drug allergies.  PSYCHOSOCIAL HISTORY: Lives at home with wife. No history of smoking. Occasional intake of alcohol, maybe 1 or 2 times in a year. Denies any illicit drug use.   FAMILY HISTORY: Hypertension runs in his family.   HOME MEDICATIONS:testosterone transdermally 60 mg for 3 days, terazosin 1 capsule p.o. once a day, Rapaflo 8 mg 1 capsule p.o. once a day, omeprazole  20 mg once daily, Percocet 1 to 2 tablets p.o. q. 4 hours as needed, lisinopril 5 mg once daily, Flonase  1 spray inhalation once daily, Flexeril 10 mg 1 tablet 3 times a day as needed for muscle spasms, fiber supplements 3 times a day, donepezil 10 mg 1 tablet p.o. once a day, duloxetine 90 mg once daily, Cialis 20 mg 1 tablet p.o. once daily.   REVIEW OF SYSTEMS:  CONSTITUTIONAL: Denies any fever, fatigue, weakness, weight loss, or weight gain.  EYES: Denies blurry vision, double vision, glaucoma, or cataracts.  ENT: Denies epistaxis, discharge, snoring, post nasal drip.  RESPIRATORY: Denies cough, COPD.  CARDIOVASCULAR: Denies any chest pain during my examination. Denies any palpitations, syncope.  GASTROINTESTINAL: Denies nausea, vomiting, diarrhea.  GENITOURINARY: No dysuria  or hematuria. Has erectile dysfunction.  ENDOCRINE: Denies polyuria, nocturia, thyroid problems.  HEMATOLOGIC AND LYMPHATIC: No anemia, easy bruising, or bleeding.  INTEGUMENTARY: No acne, rash, lesions.  MUSCULOSKELETAL: No joint pain in the neck and back. Denies gout.   NEUROLOGIC: No vertigo, ataxia. Has dementia.  PSYCHIATRIC: Has chronic depression. Denies any insomnia. Denies any ADD.   PHYSICAL EXAMINATION:  VITAL SIGNS: Temperature 98.2, pulse 67, respirations 20, blood pressure 148/80, pulse oximetry 98%. GENERAL: The patient is not in any acute distress. Moderately built and nourished.  HEENT: Normocephalic, atraumatic. Pupils are equally reacting to light and accommodation. No scleral icterus. No conjunctival injection. No sinus tenderness. No postnasal drip.  NECK: Supple. No JVD. No thyromegaly. Range of motion is intact.  LUNGS: Clear to auscultation bilaterally. No accessory muscle usage. No anterior chest wall tenderness on palpation.  CARDIAC: S1 and S2 normal. Regular rate and rhythm. No murmurs.  GASTROINTESTINAL: Soft. Bowel sounds are positive in all 4 quadrants. Nontender, nondistended. No hepatosplenomegaly. No masses felt.  NEUROLOGIC: Awake, alert, oriented x 3. Cranial nerves II-XII are grossly intact. Motor and sensory are intact. Reflexes are 2+.  EXTREMITIES: No edema. No cyanosis. No clubbing.  SKIN: Warm to touch. Normal turgor. No rashes. No lesions.  MUSCULOSKELETAL: No joint effusion, tenderness, or erythema.  PSYCHIATRIC: Normal mood and affect.   LABORATORIES AND IMAGING STUDIES: CK total 270. CPK-MB initially 6.4. Subsequently it is at 18.3. Troponin 0.04. Repeat 1.50 CBC is normal. LFTs are normal. Glucose 164, BUN is normal, creatinine 1.45, sodium and potassium are normal. Chloride 108, CO2 of 24, GFR 50. Anion gap, serum osmolality, and calcium are normal. A 12-lead EKG, normal sinus rhythm at 75 beats per minute, no acute ST-T wave changes. Chest x-ray: No acute abnormalities.   ASSESSMENT AND PLAN: A 64 year old Caucasian male presenting to the ER with a chief complaint of bilateral shoulder pains and arm pains after doing yard work will be admitted with the following assessment and plan. The patient's second troponin is  elevated with no acute ST-T wave changes on EKG. 1. Non-ST segment elevation myocardial infarction. We will admit him to CCU stepdown unit, will cycle cardiac biomarkers. We will get an echocardiogram. The patient will be on aspirin, Plavix, statin, beta blocker, and heparin drip. The heparin bolus was given in the ER and drip was  initiated in the ER by the ER physician. Consult on-call [facility]ology group.  2. Hypertension. Blood pressure is elevated this could be from stress. We will resume his home medications and up titrate as needed basis.  3. Gastroesophageal reflux disease. The patient will be on GI prophylaxis .  4. Depression. Resume his home medication.  5. Dementia. Resume his donepezil.  6. The patient has history of erectile dysfunction. We will hold Cialis and other hormonal supplements at this time.  7. Will provide him gastrointestinal prophylaxis. Deep vein thrombosis prophylaxis is not needed as the patient is on heparin drip. The side effects of heparin were discussed with the patient including bleeding. They are aware.  The diagnosis and plan of care was discussed in detail with the patient and his wife at bedside. They both verbalized understanding of the plan.   CODE STATUS: He is a full code. Wife is the medical power of attorney.  TOTAL CRITICAL CARE TIME SPENT: 60 minutes.   ____________________________ Ramonita Lab, MD ag:lt/am D: 09/13/2013 01:00:21 ET T: 09/13/2013 01:47:30 ET JOB#: [id number]  cc: Ramonita Lab, MD, <Dictator> Teena Irani. Terance Hart, MD [facility]   Ramonita Lab MD ELECTRONICALLY SIGNED 09/25/2013 0:45

## 2014-08-12 NOTE — Discharge Summary (Signed)
PATIENT NAME:  Blake [name], Blake [name] MR#:  [id number] DATE OF BIRTH:  [birth year]  DATE OF ADMISSION:  09/12/2013 DATE OF DISCHARGE:  09/14/2013  For a detailed note, please take a look at the history and physical done on admission by Dr. Amado Coe.   DIAGNOSES AT DISCHARGE: Acute non-ST elevation myocardial infarction, dementia, hypertension, gastroesophageal reflux disease, hyperlipidemia.   The patient is being discharged on a low-sodium, low-fat diet.   ACTIVITY: As tolerated.   Follow up with Dr. Dorothyann Peng in the next 1 to 2 weeks. Also follow up with Dr. Dorothey Baseman in the next 1 to 2 weeks.   DISCHARGE MEDICATIONS:  Cymbalta 90 mg daily, lisinopril 5 mg daily, terazosin 10 mg at bedtime, Rapaflo 8 mg daily, Aricept 10 mg at bedtime, rabeprazole 20 mg daily, Cialis 20 mg daily as needed, testosterone supplement 2 pumps transdermal daily, Flonase 1 spray to each nostril daily, Percocet 5/325, 1 to 2 tabs q.4 hours as needed; Flexeril 10 mg t.i.d. as needed, aspirin 81 mg daily, Brilinta 90 mg b.i.d., metoprolol tartrate 25 mg b.i.d. and Lipitor 80 mg at bedtime.   CONSULTANTS DURING THE HOSPITAL COURSE: Dr. Dorothyann Peng from cardiology.   PERTINENT STUDIES DONE DURING THE HOSPITAL COURSE: Are as follows: A cardiac catheterization done by Dr. Juliann Pares on 09/13/2013, showing 90% stenosis to the proximal circumflex to which a drug-eluting stent was placed.   HOSPITAL COURSE:  This is a 64 year old male with medical problems as mentioned above, presented to the hospital with chest pain.  1.  Non-ST-elevation myocardial infarction. This was likely the cause of the patient's chest pain given his elevated troponin and his clinical symptoms. His troponin went up as high as 14. He was admitted to the hospital to the intensive care unit, started on a heparin nomogram, along with aspirin, maintained on beta blocker, ACE inhibitor and a statin. A cardiology consult was obtained. The patient was seen by  Dr. Juliann Pares, who ended up performing a cardiac catheterization. The patient was noted to have single one-vessel coronary artery disease to the proximal circumflex and had a drug-eluting stent placed to that. Post cath and stent placement, the patient is now chest pain free and hemodynamically stable, therefore is being discharged on aspirin, Brilinta, beta blocker, ACE and a statin. The patient will follow up with Dr. Juliann Pares as an outpatient in the next 1 to 2 weeks.  2. Benign prostatic hypertrophy. The patient had no evidence of urinary obstruction. He will continue his Rapaflo and terazosin.  3.  Gastroesophageal reflux disease. The patient was maintained on his ranitidine. He will resume that.  4.  Hypertension. The patient was maintained on his ACE inhibitor. Metoprolol was added given his acute non-ST elevation MI.  He has remained hemodynamically stable on those medications.  5.  History of dementia. The patient was maintained on his Aricept and Cymbalta and he will resume that upon discharge too.   The patient is a FULL CODE.   Time spent on discharge is 40 minutes.   ____________________________ Rolly Pancake. Cherlynn Kaiser, MD vjs:dmm D: 09/14/2013 14:53:00 ET T: 09/14/2013 21:41:54 ET JOB#: [id number]  cc: Rolly Pancake. Cherlynn Kaiser, MD, <Dictator> Dwayne D. Juliann Pares, MD Teena Irani. Terance Hart, MD Houston Siren MD ELECTRONICALLY SIGNED 09/24/2013 21:47

## 2014-08-12 NOTE — Consult Note (Signed)
Chief Complaint:  Subjective/Chief Complaint Pt feels much better today. He deies further cp sice last night. No groi issues.   VITAL SIGNS/ANCILLARY NOTES: **Vital Signs.:   27-May-15 07:41  Vital Signs Type Routine  Temperature Temperature (F) 97.7  Celsius 36.5  Temperature Source oral  Pulse Pulse 80  Respirations Respirations 14  Systolic BP Systolic BP 756  Diastolic BP (mmHg) Diastolic BP (mmHg) 56  Mean BP 72  Pulse Ox % Pulse Ox % 95  Pulse Ox Activity Level  At rest  Oxygen Delivery Room Air/ 21 %  Pulse Ox Heart Rate 66  *Intake and Output.:   Shift 27-May-15 07:00  Grand Totals Intake:  688.8 Output:  1100    Net:  -411.2 24 Hr.:  -642.6  IV (Primary)      In:  88.8  IV (Primary)      In:  600  Urine ml     Out:  1100  Length of Stay Totals Intake:  4079.4 Output:  5150    Net:  -1070.6   Brief Assessment:  GEN well developed, well nourished, no acute distress   Cardiac Regular  murmur present  -- JVD   Respiratory normal resp effort  clear BS  no use of accessory muscles   Gastrointestinal Normal   Gastrointestinal details normal Soft  Nontender  Nondistended  Bowel sounds normal   EXTR negative cyanosis/clubbing, negative edema   Lab Results: Thyroid:  26-May-15 01:40   Thyroid Stimulating Hormone 3.62 (0.45-4.50 (International Unit)  ----------------------- Pregnant patients have  different reference  ranges for TSH:  - - - - - - - - - -  Pregnant, first trimetser:  0.36 - 2.50 uIU/mL)  Hepatic:  26-May-15 01:40   Bilirubin, Total 0.7  Alkaline Phosphatase 77 (45-117 NOTE: New Reference Range 03/11/13)  SGPT (ALT) 35  SGOT (AST)  66  Total Protein, Serum 6.6  Albumin, Serum 3.6  Cardiology:  26-May-15 08:35   Ventricular Rate 73  Atrial Rate 73  P-R Interval 170  QRS Duration 86  QT 410  QTc 451  P Axis 39  R Axis 21  T Axis 3  ECG interpretation Normal sinus rhythm Septal infarct (cited on or before  12-Sep-2013) Abnormal ECG When compared with ECG of 12-Sep-2013 22:42, Inverted T waves have replaced nonspecific T wave abnormality in Inferior leads Confirmed by Glenetta Hew ([id number]) on 09/14/2013 9:47:46 AM  Overreader: Glenetta Hew    11:28   Ventricular Rate 62  Atrial Rate 62  P-R Interval 166  QRS Duration 88  QT 456  QTc 462  P Axis 8  R Axis 63  T Axis 51  ECG interpretation Normal sinus rhythm Normal ECG When compared with ECG of 13-Sep-2013 08:35, Criteria for Septal infarct are no longer Present T wave inversion no longer evident in Inferior leads Confirmed by Glenetta Hew ([id number]) on 09/14/2013 9:40:58 AM  Overreader: Glenetta Hew  Routine Chem:  26-May-15 01:40   Glucose, Serum  102  BUN 8  Creatinine (comp) 1.17  Sodium, Serum 141  Potassium, Serum 3.8  Chloride, Serum  108  CO2, Serum 28  Calcium (Total), Serum  8.4  Anion Gap  5  Osmolality (calc) 280  eGFR (African American) >60  eGFR (Non-African American) >60 (eGFR values <57m/min/1.73 m2 may be an indication of chronic kidney disease (CKD). Calculated eGFR is useful in patients with stable renal function. The eGFR calculation will not be reliable in acutely ill patients when serum creatinine  is changing rapidly. It is not useful in  patients on dialysis. The eGFR calculation may not be applicable to patients at the low and high extremes of body sizes, pregnant women, and vegetarians.)  Result Comment TROPONIN - RESULTS VERIFIED BY REPEAT TESTING.  - READ-BACK PROCESS PERFORMED.  - C/FAITH HENRY 09-13-13 0315 [facility]  Result(s) reported on 13 Sep 2013 at 03:23AM.  Magnesium, Serum 1.9 (1.8-2.4 THERAPEUTIC RANGE: 4-7 mg/dL TOXIC: > 10 mg/dL  -----------------------)  Cholesterol, Serum 179  Triglycerides, Serum 51  HDL (INHOUSE) 45  VLDL Cholesterol Calculated 10  LDL Cholesterol Calculated  124 (Result(s) reported on 13 Sep 2013 at 02:39AM.)    06:18   Result Comment TROPONIN - RESULTS  VERIFIED BY REPEAT TESTING.  - RESULTS PREVIOUSLY CALLED TO [place] AT 2231 ON 09/12/13 BY SFJ...[facility]  Result(s) reported on 13 Sep 2013 at 07:41AM.  Cardiac:  26-May-15 01:40   CPK-MB, Serum  69.3 (Result(s) reported on 13 Sep 2013 at 02:19AM.)  Troponin I  9.10 (0.00-0.05 0.05 ng/mL or less: NEGATIVE  Repeat testing in 3-6 hrs  if clinically indicated. >0.05 ng/mL: POTENTIAL  MYOCARDIAL INJURY. Repeat  testing in 3-6 hrs if  clinically indicated. NOTE: An increase or decrease  of 30% or more on serial  testing suggests a  clinically important change)    06:18   Troponin I  14.00 (0.00-0.05 0.05 ng/mL or less: NEGATIVE  Repeat testing in 3-6 hrs  if clinically indicated. >0.05 ng/mL: POTENTIAL  MYOCARDIAL INJURY. Repeat  testing in 3-6 hrs if  clinically indicated. NOTE: An increase or decrease  of 30% or more on serial  testing suggests a  clinically important change)    06:19   CPK-MB, Serum  94.1 (Result(s) reported on 13 Sep 2013 at 06:49AM.)    19:57   CK, Total  496 (39-308 NOTE: NEW REFERENCE RANGE  05/23/2013)  CPK-MB, Serum  56.7 (Result(s) reported on 13 Sep 2013 at 08:38PM.)  Routine Coag:  26-May-15 06:18   Activated PTT (APTT)  44.2 (A HCT value >55% may artifactually increase the APTT. In one study, the increase was an average of 19%. Reference: "Effect on Routine and Special Coagulation Testing Values of Citrate Anticoagulant Adjustment in Patients with High HCT Values." American Journal of Clinical Pathology 2006;126:400-405.)    12:57   Activated PTT (APTT) 32.6 (A HCT value >55% may artifactually increase the APTT. In one study, the increase was an average of 19%. Reference: "Effect on Routine and Special Coagulation Testing Values of Citrate Anticoagulant Adjustment in Patients with High HCT Values." American Journal of Clinical Pathology 2006;126:400-405.)  Routine Hem:  26-May-15 01:40   WBC (CBC) 7.8  RBC (CBC) 4.68  Hemoglobin  (CBC) 14.6  Hematocrit (CBC) 44.1  Platelet Count (CBC) 214  MCV 94  MCH 31.2  MCHC 33.1  RDW 13.1  Neutrophil % 62.4  Lymphocyte % 24.5  Monocyte % 10.8  Eosinophil % 1.4  Basophil % 0.9  Neutrophil # 4.9  Lymphocyte # 1.9  Monocyte # 0.8  Eosinophil # 0.1  Basophil # 0.1 (Result(s) reported on 13 Sep 2013 at 02:03AM.)   Radiology Results: XRay:    25-May-15 17:40, Chest Portable Single View  Chest Portable Single View   REASON FOR EXAM:    Chest Pain  COMMENTS:       PROCEDURE: DXR - DXR PORTABLE CHEST SINGLE VIEW  - Sep 12 2013  5:40PM     CLINICAL DATA:  Chest pain, arm  pain    EXAM:  PORTABLE CHEST - 1 VIEW    COMPARISON:  None.    FINDINGS:  Cardiomediastinal silhouette is unremarkable. No acute infiltrate or  pleural effusion. No pulmonary edema. Mild degenerative changes  thoracic spine.     IMPRESSION:  No active disease.      Electronically Signed    By: Lahoma Crocker M.D.    On: 09/12/2013 18:02         Verified By: Ephraim Hamburger, M.D.,  Cardiology:    25-May-15 17:05, ED ECG  Ventricular Rate 74  Atrial Rate 74  P-R Interval 182  QRS Duration 90  QT 352  QTc 390  P Axis 41  R Axis 56  T Axis 79  ECG interpretation   Sinus rhythm with marked sinus arrhythmia  Septal infarct , age undetermined  Abnormal ECG  When compared with ECG of 04-Jul-2002 11:52,  T wave amplitude has decreased in Lateral leads  ----------unconfirmed----------  Confirmed by OVERREAD, NOT ([id number]), editor PEARSON, BARBARA ([id number]) on 09/14/2013 1:30:43 PM  ED ECG     25-May-15 22:42, ECG  Ventricular Rate 68  Atrial Rate 68  P-R Interval 152  QRS Duration 90  QT 414  QTc 440  P Axis 43  R Axis 49  T Axis 46  ECG interpretation   Sinus rhythm with marked sinus arrhythmia  Septal infarct , age undetermined  Abnormal ECG  No previous ECGs available  ----------unconfirmed----------  Confirmed by OVERREAD, NOT ([id number]), editor PEARSON, BARBARA ([id number]) on 09/14/2013 1:32:04  PM  ECG     26-May-15 08:35, ECG  Ventricular Rate 73  Atrial Rate 73  P-R Interval 170  QRS Duration 86  QT 410  QTc 451  P Axis 39  R Axis 21  T Axis 3  ECG interpretation   Normal sinus rhythm  Septal infarct (cited on or before 12-Sep-2013)  Abnormal ECG  When compared with ECG of 12-Sep-2013 22:42,  Inverted T waves have replaced nonspecific T wave abnormality in Inferior leads  Confirmed by Glenetta Hew ([id number]) on 09/14/2013 9:47:46 AM    Overreader: Glenetta Hew  ECG     26-May-15 11:28, ECG  Ventricular Rate 62  Atrial Rate 62  P-R Interval 166  QRS Duration 88  QT 456  QTc 462  P Axis 8  R Axis 63  T Axis 51  ECG interpretation   Normal sinus rhythm  Normal ECG  When compared with ECG of 13-Sep-2013 08:35,  Criteria for Septal infarct are no longer Present  T wave inversion no longer evident in Inferior leads  Confirmed by Glenetta Hew ([id number]) on 09/14/2013 9:40:58 AM    Overreader: Glenetta Hew  ECG     27-May-15 07:11, ECG  Ventricular Rate 70  Atrial Rate 70  P-R Interval 154  QRS Duration 72  QT 462  QTc 498  P Axis 36  R Axis 68  T Axis -170  ECG interpretation   Normal sinus rhythm  T wave abnormality, consider inferior ischemia  Prolonged QT  Abnormal ECG  When compared with ECG of 13-Sep-2013 11:28,  T wave inversion now evident in Inferior leads  T wave inversion now evident in Lateral leads  ----------unconfirmed----------  Confirmed by OVERREAD, NOT ([id number]), editor PEARSON, BARBARA ([id number]) on 09/14/2013 8:29:16 AM  ECG    Assessment/Plan:  Invasive Device Daily Assessment of Necessity:  Does the patient currently have any of the following indwelling devices? none   Assessment/Plan:  Assessment IMP NQMI [place] CAD S/P PCI DES Circ HTN Hyperlipidemia GERD .   Plan PLAN ASA 81 mg QD Brilita 90 mg bid x 45yrB-blocker therapy low dose Cotinue ACE lisinapril Lipitor 80 mg po daily Omeprazole for gerd OK to d/c home  today Refer to cardiac rehab F/U cardiology 1-2 weeks   Electronic Signatures: CLujean AmelD (MD)  (Signed 27-May-15 21:34)  Authored: Chief Complaint, VITAL SIGNS/ANCILLARY NOTES, Brief Assessment, Lab Results, Radiology Results, Assessment/Plan   Last Updated: 27-May-15 21:34 by CLujean AmelD (MD)

## 2014-08-12 NOTE — Consult Note (Signed)
PATIENT NAME:  Blake [name], Blake [name] MR#:  [id number] DATE OF BIRTH:  [birth year]  DATE OF CONSULTATION:  09/13/2013  CONSULTING PHYSICIAN:  Dwayne D. Juliann Pares, MD  REFERRING PHYSICIAN: Dr. Terance Hart.  INDICATION: Non-Q-wave myocardial infarction. Unstable angina.    HISTORY OF PRESENT ILLNESS: The patient is a 64 year old white male with history of hypertension, depression, GERD, benign prostatic hypertrophy, dementia, erectile dysfunction, was gardening and started having bilateral arm pain, which was persistent. Was felt weak and fatigued and went to [employer] and then the pain got worse in both arms, felt nauseous. Finally decided to go to the Emergency Room. Pain got to 8/10 treated in the Emergency Room with morphine, Valium, pain improved, but then came back again even worse. He states in  all he may have  had pain off and on for about six hours. The patient was found to have mild insufficiency and slightly elevated troponin. He was admitted, placed on nitrates, morphine and advised to be admitted. Subsequently, his troponins increased. A cardiology consultation was recommended for further evaluation.   REVIEW OF SYSTEMS: No blackout spells or syncope. He has had nausea, vomiting. Denies fever, chills, sweats. No weight loss. No weight gain, hemoptysis, hematemesis. Denies bright red blood per rectum.   PAST MEDICAL HISTORY: Hypertension, benign prostatic hypertrophy, erectile dysfunction, dementia, depression, GERD, decreased hearing.   PAST SURGICAL HISTORY: Left foot, left tibia, lumbar fracture, tonsillectomy, hernia repair, cataract, right inguinal hernia repair.   ALLERGIES: None.   SOCIAL HISTORY: Lives with his wife, retired Administrator, Civil Service. No smoking or alcohol consumption.   FAMILY HISTORY: Hypertension.   MEDICATIONS:  Testosterone, terazosin 1 capsule daily, Rapaflo 8 mg once a day, omeprazole 20 a day, Percocet 1 to 2 tablets q.4 p.r.n., lisinopril 5 mg a day, Flonase one spray daily,   Flexeril 10 mg times a day, donepezil 10 mg once a day, duloxetine 90 mg once a day, Cialis 20 mg p.r.n.   PHYSICAL EXAMINATION: VITAL SIGNS: Blood pressure was 130/80, pulse 65, respiratory rate 14, afebrile.  HEENT: Normocephalic, atraumatic. Pupils equal, reactive to light.  NECK: Supple. No significant JVD, bruits or adenopathy.  LUNGS: Clear to auscultation and percussion. No significant wheeze, rhonchi, or rale.  HEART: Regular rate and rhythm. Systolic ejection murmur at the apex 2/6. Positive S4. PMI nondisplaced.  ABDOMEN: Benign.  EXTREMITIES: Within normal limits.  NEUROLOGIC: Intact.  SKIN: Normal.   LABORATORY DATA: Total CK 270, MB 6.4. Troponin increased to 18.  CBC normal. LFTs normal. Glucose 164. BUN was normal. Creatinine 1.45, sodium and potassium were normal.  chloride 104, CO2 24.   EKG: Normal sinus rhythm,  75, nonspecific ST-T changes. Chest x-ray negative.   ASSESSMENT: Non-Q-wave myocardial infarction. Unstable angina, hypertension, reflux, depression, possible hyperlipidemia, mild dementia. Decreased hearing.   PLAN:  1. Agree with admit. Elevated troponins. Continue anticoagulation, aspirin and Plavix, nitrates. Would proceed with cardiac catheterization for evaluation of acute coronary syndrome or non-Q-wave myocardial infarction. Consider echocardiogram.  2. Hypertension. Continue current medications and add beta blocker. Continue ACE inhibitor, nitrates as well.  3. Hyperlipidemia. Consider statin therapy, possibly with Lipitor 80 mg a day.  4. For pain continue narcotics with morphine.  5. For depression. Resume his medications help with mild depression symptoms.  6. For erectile dysfunction would hold Cialis for now while he is on nitrates.  7. For gastroesophageal reflux disease, continue omeprazole therapy for now.    Again, plan, proceed with cardiac catheterization with intention to treat today.   ____________________________  Dwayne D. Juliann Pares,  MD ddc:sg D: 09/13/2013 09:56:24 ET T: 09/13/2013 10:08:44 ET JOB#: [id number]  cc: Dwayne D. Juliann Pares, MD, <Dictator> Alwyn Pea MD ELECTRONICALLY SIGNED 10/26/2013 16:41

## 2014-08-20 NOTE — Discharge Summary (Signed)
PATIENT NAME:  Blake [name], Blake [name] MR#:  [id number] DATE OF BIRTH:  1951-01-24  DATE OF ADMISSION:  05/23/2014 DATE OF DISCHARGE:  05/24/2014  For a detailed note, please take a look at the history and physical done on admission by Dr. Enedina Finner.   DIAGNOSES AT DISCHARGE: Right eye blurry vision, now resolved. Stroke ruled out, history of frontotemporal dementia, history of coronary artery disease, hypertension, hyperlipidemia, depression, benign prostatic hypertrophy.   DISCHARGE DIET: The patient is being discharged on a low-sodium, low-fat diet.   DISCHARGE ACTIVITY: As tolerated.   FOLLOWUP CARE: Follow up with Dr. Terance Hart in the next 1-2 weeks.   DISCHARGE MEDICATIONS: Rapaflo 8 mg daily, rabeprazole 20 mg daily, Cialis 20 mg daily, Aricept 10 mg daily, Cymbalta 60 mg daily, lisinopril 5 mg daily, metoprolol tartrate 12.5 mg b.i.d., clindamycin topical to be applied once daily as needed, atorvastatin 80 mg daily, aspirin 81 mg daily, Plavix 75 mg daily, vitamin D3 1 tab b.i.d., calcium carbonate 1 tab b.i.d.   CONSULTANTS DURING HOSPITAL COURSE: Dr. Loretha Brasil from neurology.   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 Evidence of what appears to be a prior infarct in a portion of the left posterior inferior cerebellar artery distribution. The patient's MRI of the brain and MRA of the brain showing extensive encephalomalacia in the anterior and inferior frontal lobes and anterior temporal lobes bilaterally. This pattern is most consistent with severe chronic head trauma. Negative for acute infarct. Negative MRA of the brain, and no evidence of any hemodynamically significant carotid artery stenosis. A 2-dimensional echocardiogram showing normal ejection fraction. No source of embolism or thrombus, EF 50% to 55%.   HOSPITAL COURSE: This is a 64 year old male with  medical problems as mentioned above, presented to the hospital due to a right eye blurry vision and headache.  1. Right eye blurry vision. The exact etiology of this is still unclear, although acute stroke has been ruled out. That was the primary suspicious diagnosis on admission. The patient's CT head was somewhat abnormal for a possible stroke, although the MRI of the brain showed just significant encephalomalacia consistent with head trauma, which the patient has had in the past. The patient's visual symptoms have significantly improved and now are back to baseline. Therefore, the patient is being discharged home. He will continue his Plavix and statin as mentioned.  2. History of frontotemporal dementia. The patient was seen by neurology. They did think that the patient still had that. The patient, apparently, has had neuropsychiatric testing done in [facility], and they claimed that he no longer has this type of dementia. This is to be further followed by his neurologist and psychiatrist as an outpatient. At this point, he will continue his Aricept and Cymbalta.  3. History of coronary artery disease, status post stent. The patient had no acute chest pain. His cardiac markers x 3 were negative. He will continue his aspirin, Plavix, beta blocker, and statin.  4. Benign prostatic hypertrophy. The patient was maintained on his lateral Rapaflo. He will resume that.  5. Depression. The patient was maintained with Cymbalta. He will resume that.  6. Hyperlipidemia. The patient was maintained on his atorvastatin. He will also resume that upon discharge.   CODE STATUS: The patient is a full code.   TIME SPENT: 40 minutes    ____________________________ Rolly Pancake. Cherlynn Kaiser, MD vjs:mw D: 05/24/2014 15:55:37 ET T:  05/24/2014 19:36:02 ET JOB#: [id number]  cc: Rolly Pancake. Cherlynn Kaiser, MD, <Dictator> Teena Irani. Terance Hart, MD [place] Siren MD ELECTRONICALLY SIGNED 06/03/2014 12:54

## 2014-08-20 NOTE — Consult Note (Signed)
PATIENT NAME:  Blake [name], Blake [name] MR#:  [id number] DATE OF BIRTH:  [birth year]  DATE OF CONSULTATION:  05/23/2014  REFERRING PHYSICIAN:   CONSULTING PHYSICIAN:  Pauletta Browns, MD   REASON FOR CONSULTATION:  Strokes.   HISTORY OF PRESENT ILLNESS: This is a 64 year old gentleman with past medical history of traumas, depression, hypertension, presenting at outside facility with suspicion of amaurosis fugax where he had transient loss of vision that has resolved status post CAT scan of the head that showed questionable bilateral infarcts bilaterally. The patient is status post MI, status post stenting.  At baseline, he is on aspirin and Brilinta.  Currently, states he feels back to his baseline. No headaches. No visual changes. No shortness of breath. No weakness on one side of the body compared to the other.    HOME MEDICATIONS: Reviewed.   REVIEW OF SYSTEMS:  No shortness of breath. No chest pain. Currently, no visual changes. No heat or cold intolerance. No weakness on one side of the body compared to the other.    SOCIAL HISTORY:  Lives with his wife, not a smoker. No drug or alcohol use. Used to work as a International aid/development worker.   NEUROLOGICAL EVALUATION:  The patient is awake, alert and oriented in time, place, location and the reason why he is in the hospital. He is hard of hearing.  Speech appears to be fluent. No signs of dysarthria. No signs of aphasia. Extraocular movements are intact. Facial sensation intact. Facial motor is intact. Tongue is midline. Uvula elevates symmetrically. Shoulder shrug is intact. Motor strength 5/5 bilateral upper and lower extremities. Sensation intact to light touch and temperature. Coordination intact. Reflexes symmetrical. Gait not assessed.   IMPRESSION: A 64 year old gentleman with past medical history of hypertension, depression, questionable diagnosis of dementia, status post neurocognitive testing at [facility] presenting with questionable amaurosis fugax that has resolved.  Currently, back to his baseline.  CT suggestion questionable bilateral infarcts of undetermined age. The patient was sent for an MRI/MRA to make sure there are no signs of vasculitis which could be the cause of strokes of different  age. MRI showing extensive encephalomalacia in the bilateral frontal and temporal lobes consistent with history of trauma. No acute ischemia is brown. MRA: No signs of vasculitis. No signs of any intracranial pathology.   PLAN: We will continue the aspirin and Brilinta the patient was on prior to discharge.  The patient is to follow up with neurology as outpatient. Based on imaging and the patient's symptoms, likely frontotemporal dementia. 2-D echo, physical therapy.  We will follow with cardiology as an outpatient for possibility of TEE and possibility of a Holter monitor.   Thank you. It was a pleasure seeing this patient. This case was discussed with the patient's wife at bedside and the patient himself.     ____________________________ Pauletta Browns, MD yz:by D: 05/23/2014 16:23:57 ET T: 05/23/2014 20:00:57 ET JOB#: [id number]  cc: Pauletta Browns, MD, <Dictator> Pauletta Browns MD ELECTRONICALLY SIGNED 05/30/2014 12:08

## 2014-08-20 NOTE — H&P (Signed)
PATIENT NAME:  Blake [name], Blake [name] MR#:  [id number] DATE OF BIRTH:  April 03, 1951  DATE OF ADMISSION:  05/23/2014  PRIMARY CARE PHYSICIAN: Nonlocal.  PRIMARY CARDIOLOGIST: Dwayne D. Callwood, MD    CHIEF COMPLAINT: Difficult vision in the right eye.   HISTORY OF PRESENT ILLNESS: Blake [name] is a 64 year old Caucasian gentleman with past medical history of coronary artery disease, status post stent, history of TIA, depression, hypertension, and history of head trauma, comes to the Emergency Room after he was seen by his ophthalmologist for complaint of on and off decreased vision in his right eye with blurred images for 2 days. The patient was seen by Dr. Oren Bracket, at [facility] and was sent here to the Emergency Room for further evaluation and management. During my evaluation, the patient's visual symptoms had improved remarkably. He does take aspirin and Plavix as part of his cardiac medications. The patient denies any other focal weakness or any dysarthria, or dysphagia. According to the patient's wife, the patient had a cognitive evaluation done about 2-3 weeks ago at [facility] and is awaiting to follow up with his physicians at [facility] to discuss the results.   PAST MEDICAL HISTORY:  1.  Head trauma secondary to a fall in 2011.  2.  History of MI, status post stent in 08/2013.  3.  History of dementia; however, per wife, workup at [facility] was negative for dementia.  4.  Left tibia fracture.  5.  History of left foot fracture.  6.  Hypertension.  7.  Depression.   ALLERGIES: No known drug allergies.   MEDICATIONS:  1.  Vitamin D3 one tablet daily.  2.  Rapaflo 8 mg daily.  3.  Rabeprazole 20 mg p.o. daily.  4.  Plavix 75 mg daily.  5.  Metoprolol 25 mg 1/2 tablet daily b.i.d.  6.  Lisinopril 10 mg 1/2 tablet daily.  7.  Duloxetine 60 mg p.o. daily.  8.  Donepezil 10 mg daily.  9.  Clindamycin topical 1% apply to affected area.  10.  Cialis 20 mg daily.  11.  Calcium carbonate 600 mg b.i.d.   12.  Atorvastatin 80 mg daily at bedtime.  13.  Aspirin 81 mg daily.   FAMILY HISTORY: Hypertension.   SOCIAL HISTORY: Lives at home with wife. No history of smoking, occasionally takes alcohol. Denies any illicit drug use.   REVIEW OF SYSTEMS: CONSTITUTIONAL: No fever, fatigue, weakness.  EYES: Positive for blurred vision earlier, improved now.  EARS, NOSE, AND THROAT: No tinnitus, ear pain, hearing loss, or postnasal drip.  RESPIRATORY: No cough, wheeze, hemoptysis, or dyspnea.  CARDIOVASCULAR: No chest pain, orthopnea, edema, or dyspnea on exertion.  GASTROINTESTINAL: No nausea, vomiting, diarrhea, abdominal pain. No GERD.  GENITOURINARY: No dysuria, hematuria, or frequency.  ENDOCRINE: No polyuria, nocturia, or thyroid problems.  HEMATOLOGY: No anemia, easy bruising, or bleeding.  SKIN: No acne, rash, or lesion.  MUSCULOSKELETAL: Positive for arthritis. No swelling, gout. NEUROLOGIC: No CVA, TIA, or seizures.  PSYCHIATRIC: No anxiety. Positive for history of depression.   All other systems reviewed and negative.   PHYSICAL EXAMINATION:  GENERAL: The patient is awake, alert, oriented x 3, not in acute distress.  VITAL SIGNS: He is afebrile, pulse is 54, blood pressure 130/80, saturations are 100% on room air.  HEENT: Atraumatic, normocephalic. PERRLA. EOM intact. Oral mucosa is moist.  NECK: Supple. No JVD. No carotid bruit.  LUNGS: Clear to auscultation bilaterally. No rales, rhonchi, respiratory distress or labored breathing.  HEART: Both  the heart sounds are normal. Rate, rhythm regular. PMI not lateralized. Chest is nontender.  EXTREMITIES: Good pedal pulses, good femoral pulses. No lower extremity edema.  ABDOMEN: Soft, benign, nontender. No organomegaly. Positive bowel sounds.  NEUROLOGIC: The patient is right-handed. Cranial III-XII appear intact. There is no nystagmus, no facial droop. Speech is clear. Reflexes 1+ in both upper and lower extremity, 4+/strength in both  upper and lower extremities. Sensory examination appears intact.  SKIN: Warm and dry.  PSYCHIATRIC: The patient is awake, alert, oriented x 3.   RADIOLOGIC DATA: MRA of the neck with and without contrast shows no significant disease at either carotid bifurcation. Normal appearance posterior circulation in the neck. Proximal circle of Willis within normal limits.   MRI of the brain shows extensive encephalomalacia in the anterior inferior frontal lobes and anterior temporal lobes bilaterally. This pattern is consistent with severe chronic head trauma. Negative acute infarct. Negative MRA head.   LABORATORY DATA: CBC within normal limits. Comprehensive metabolic panel within normal limits. Lipid profile within normal limits.   ASSESSMENT: A 64 year old, Mr. Kapur, with history of myocardial infarction, transient ischemic attack, depression, comes in with:  1.  Blurred vision in the right eye. The patient's workup essentially is negative for acute stroke. Unclear etiology at this time. He has already been seen by [facility] this morning. The patient was seen by Dr. Loretha Brasil. We will await his recommendations for possible workup on vasculitis if appropriate. We will order an echocardiogram of the heart. Continue aspirin and Plavix for now, which the patient already is on at home along with statins. Watch for neurological checks and further evaluation thereafter.  2.  History of coronary artery disease, status post stent. Continue statins, aspirin, Plavix, and other cardiac medications.  3.  History of dementia; however, the patient's wife states he had an extensive workup done and evaluation so far does not incline towards dementia. He follows up with [facility] for the same.  4.  Hypertension. We will continue his home medications.   Further workup according to the patient's clinical course. Hospital admission plan was discussed with the patient and the patient's wife.   TIME SPENT: Fifty  minutes.    ____________________________ Wylie Hail Allena Katz, MD sap:TT D: 05/23/2014 17:20:31 ET T: 05/23/2014 17:36:06 ET JOB#: [id number]  cc: Gustin Zobrist A. Allena Katz, MD, <Dictator> Willow Ora MD ELECTRONICALLY SIGNED 06/08/2014 23:20

## 2014-11-21 DIAGNOSIS — Z79899 Other long term (current) drug therapy: Secondary | ICD-10-CM

## 2014-11-21 HISTORY — DX: Other long term (current) drug therapy: Z79.899

## 2015-01-21 ENCOUNTER — Encounter: Payer: Self-pay | Admitting: Emergency Medicine

## 2015-01-21 ENCOUNTER — Emergency Department
Admission: EM | Admit: 2015-01-21 | Discharge: 2015-01-21 | Discharge status: Against Medical Advice | Payer: Medicare Other | Attending: Emergency Medicine | Admitting: Emergency Medicine

## 2015-01-21 DIAGNOSIS — I10 Essential (primary) hypertension: Secondary | ICD-10-CM | POA: Diagnosis not present

## 2015-01-21 DIAGNOSIS — R112 Nausea with vomiting, unspecified: Secondary | ICD-10-CM | POA: Insufficient documentation

## 2015-01-21 DIAGNOSIS — R197 Diarrhea, unspecified: Secondary | ICD-10-CM | POA: Diagnosis not present

## 2015-01-21 LAB — LIPASE, BLOOD: Lipase: 19 U/L — ABNORMAL LOW (ref 22–51)

## 2015-01-21 LAB — COMPREHENSIVE METABOLIC PANEL
ALBUMIN: 4.8 g/dL (ref 3.5–5.0)
ALK PHOS: 75 U/L (ref 38–126)
ALT: 27 U/L (ref 17–63)
ANION GAP: 9 (ref 5–15)
AST: 32 U/L (ref 15–41)
BUN: 14 mg/dL (ref 6–20)
CALCIUM: 9.1 mg/dL (ref 8.9–10.3)
CO2: 21 mmol/L — AB (ref 22–32)
Chloride: 107 mmol/L (ref 101–111)
Creatinine, Ser: 1.1 mg/dL (ref 0.61–1.24)
GFR calc Af Amer: >60 mL/min (ref >60)
GFR calc non Af Amer: >60 mL/min (ref >60)
GLUCOSE: 183 mg/dL — AB (ref 65–99)
Potassium: 4.3 mmol/L (ref 3.5–5.1)
SODIUM: 137 mmol/L (ref 135–145)
Total Bilirubin: 1 mg/dL (ref 0.3–1.2)
Total Protein: 7.6 g/dL (ref 6.5–8.1)

## 2015-01-21 LAB — CBC
HEMATOCRIT: 50.9 % (ref 40.0–52.0)
HEMOGLOBIN: 17.1 g/dL (ref 13.0–18.0)
MCH: 31.6 pg (ref 26.0–34.0)
MCHC: 33.5 g/dL (ref 32.0–36.0)
MCV: 94.2 fL (ref 80.0–100.0)
Platelets: 272 10*3/uL (ref 150–440)
RBC: 5.4 MIL/uL (ref 4.40–5.90)
RDW: 13.4 % (ref 11.5–14.5)
WBC: 9.9 10*3/uL (ref 3.8–10.6)

## 2015-01-21 MED ORDER — SODIUM CHLORIDE 0.9 % IV BOLUS (SEPSIS)
1000.0000 mL | Freq: Once | INTRAVENOUS | Status: AC
  Administered 2015-01-21: 1000 mL via INTRAVENOUS

## 2015-01-21 MED ORDER — ONDANSETRON HCL 4 MG/2ML IJ SOLN
4.0000 mg | Freq: Once | INTRAMUSCULAR | Status: AC | PRN
  Administered 2015-01-21: 4 mg via INTRAVENOUS
  Filled 2015-01-21: qty 2

## 2015-01-21 NOTE — ED Notes (Signed)
Pt states vomiting has been projectile.

## 2015-01-21 NOTE — ED Notes (Signed)
Pt states vomiting and diarrhea began early this am, also c/o feeling achy. Pt appears pale.

## 2015-02-09 ENCOUNTER — Ambulatory Visit
Admission: RE | Admit: 2015-02-09 | Discharge: 2015-02-09 | Disposition: A | Discharge status: Home / Self Care | Payer: Medicare Other | Source: Ambulatory Visit | Attending: Ophthalmology | Admitting: Ophthalmology

## 2015-02-09 DIAGNOSIS — H209 Unspecified iridocyclitis: Secondary | ICD-10-CM | POA: Insufficient documentation

## 2015-02-09 LAB — CBC WITH DIFFERENTIAL/PLATELET
BASOS ABS: 0.1 10*3/uL (ref 0–0.1)
Basophils Relative: 1 %
Eosinophils Absolute: 0.1 10*3/uL (ref 0–0.7)
Eosinophils Relative: 2 %
HEMATOCRIT: 43.2 % (ref 40.0–52.0)
Hemoglobin: 14.7 g/dL (ref 13.0–18.0)
LYMPHS ABS: 1.6 10*3/uL (ref 1.0–3.6)
LYMPHS PCT: 27 %
MCH: 31.5 pg (ref 26.0–34.0)
MCHC: 33.9 g/dL (ref 32.0–36.0)
MCV: 92.9 fL (ref 80.0–100.0)
MONO ABS: 0.6 10*3/uL (ref 0.2–1.0)
Monocytes Relative: 11 %
NEUTROS ABS: 3.4 10*3/uL (ref 1.4–6.5)
Neutrophils Relative %: 59 %
Platelets: 272 10*3/uL (ref 150–440)
RBC: 4.65 MIL/uL (ref 4.40–5.90)
RDW: 12.6 % (ref 11.5–14.5)
WBC: 5.9 10*3/uL (ref 3.8–10.6)

## 2015-02-09 LAB — ACETAMINOPHEN LEVEL: Acetaminophen (Tylenol), Serum: <10 ug/mL — ABNORMAL LOW (ref 10–30)

## 2015-02-12 LAB — ANA COMPREHENSIVE PANEL
DS DNA AB: 2 [IU]/mL (ref 0–9)
ENA SM Ab Ser-aCnc: 0.2 AI (ref 0.0–0.9)
Ribonucleic Protein: <0.2 AI (ref 0.0–0.9)
SSA (Ro) (ENA) Antibody, IgG: <0.2 AI (ref 0.0–0.9)
SSB (La) (ENA) Antibody, IgG: <0.2 AI (ref 0.0–0.9)
Scleroderma (Scl-70) (ENA) Antibody, IgG: <0.2 AI (ref 0.0–0.9)

## 2015-02-12 LAB — FLUORESCENT TREPONEMAL AB(FTA)-IGG-BLD: FLUORESCENT TREPONEMAL AB, IGG: NONREACTIVE

## 2015-02-12 LAB — B. BURGDORFI ANTIBODIES: B burgdorferi Ab IgG+IgM: <0.91 {ISR} (ref 0.00–0.90)

## 2015-02-12 LAB — RPR: RPR Ser Ql: NONREACTIVE

## 2015-02-14 ENCOUNTER — Ambulatory Visit
Admission: RE | Admit: 2015-02-14 | Discharge: 2015-02-14 | Disposition: A | Discharge status: Home / Self Care | Payer: Medicare Other | Source: Ambulatory Visit | Attending: Ophthalmology | Admitting: Ophthalmology

## 2015-02-14 ENCOUNTER — Other Ambulatory Visit: Payer: Self-pay | Admitting: Ophthalmology

## 2015-02-14 DIAGNOSIS — H209 Unspecified iridocyclitis: Secondary | ICD-10-CM | POA: Diagnosis not present

## 2015-04-20 DIAGNOSIS — H209 Unspecified iridocyclitis: Secondary | ICD-10-CM

## 2015-04-20 DIAGNOSIS — H2013 Chronic iridocyclitis, bilateral: Secondary | ICD-10-CM | POA: Insufficient documentation

## 2015-04-20 DIAGNOSIS — Z961 Presence of intraocular lens: Secondary | ICD-10-CM

## 2015-04-20 DIAGNOSIS — H338 Other retinal detachments: Secondary | ICD-10-CM | POA: Insufficient documentation

## 2015-04-20 HISTORY — DX: Other retinal detachments: H33.8

## 2015-04-20 HISTORY — DX: Presence of intraocular lens: Z96.1

## 2015-04-20 HISTORY — DX: Unspecified iridocyclitis: H20.9

## 2015-04-20 HISTORY — DX: Chronic iridocyclitis, bilateral: H20.13

## 2015-05-24 ENCOUNTER — Encounter: Payer: Self-pay | Admitting: *Deleted

## 2015-05-30 ENCOUNTER — Encounter: Payer: Self-pay | Admitting: *Deleted

## 2015-05-30 ENCOUNTER — Ambulatory Visit
Admission: RE | Admit: 2015-05-30 | Discharge: 2015-05-30 | Disposition: A | Discharge status: Home / Self Care | Payer: Medicare Other | Source: Ambulatory Visit | Attending: Podiatry | Admitting: Podiatry

## 2015-05-30 ENCOUNTER — Ambulatory Visit: Payer: Medicare Other | Admitting: Anesthesiology

## 2015-05-30 ENCOUNTER — Encounter
Admission: RE | Disposition: A | Discharge status: Home / Self Care | Payer: Self-pay | Source: Ambulatory Visit | Attending: Podiatry

## 2015-05-30 DIAGNOSIS — M19172 Post-traumatic osteoarthritis, left ankle and foot: Secondary | ICD-10-CM | POA: Insufficient documentation

## 2015-05-30 DIAGNOSIS — Z9889 Other specified postprocedural states: Secondary | ICD-10-CM | POA: Diagnosis not present

## 2015-05-30 DIAGNOSIS — Z961 Presence of intraocular lens: Secondary | ICD-10-CM | POA: Insufficient documentation

## 2015-05-30 DIAGNOSIS — Z8639 Personal history of other endocrine, nutritional and metabolic disease: Secondary | ICD-10-CM | POA: Diagnosis not present

## 2015-05-30 DIAGNOSIS — Z79899 Other long term (current) drug therapy: Secondary | ICD-10-CM | POA: Diagnosis not present

## 2015-05-30 DIAGNOSIS — Z9842 Cataract extraction status, left eye: Secondary | ICD-10-CM | POA: Diagnosis not present

## 2015-05-30 DIAGNOSIS — N529 Male erectile dysfunction, unspecified: Secondary | ICD-10-CM | POA: Insufficient documentation

## 2015-05-30 DIAGNOSIS — Z981 Arthrodesis status: Secondary | ICD-10-CM | POA: Insufficient documentation

## 2015-05-30 DIAGNOSIS — K219 Gastro-esophageal reflux disease without esophagitis: Secondary | ICD-10-CM | POA: Diagnosis not present

## 2015-05-30 DIAGNOSIS — Z7982 Long term (current) use of aspirin: Secondary | ICD-10-CM | POA: Diagnosis not present

## 2015-05-30 DIAGNOSIS — N138 Other obstructive and reflux uropathy: Secondary | ICD-10-CM | POA: Insufficient documentation

## 2015-05-30 DIAGNOSIS — I2510 Atherosclerotic heart disease of native coronary artery without angina pectoris: Secondary | ICD-10-CM | POA: Insufficient documentation

## 2015-05-30 DIAGNOSIS — Z8261 Family history of arthritis: Secondary | ICD-10-CM | POA: Insufficient documentation

## 2015-05-30 DIAGNOSIS — Z8249 Family history of ischemic heart disease and other diseases of the circulatory system: Secondary | ICD-10-CM | POA: Diagnosis not present

## 2015-05-30 DIAGNOSIS — F0390 Unspecified dementia without behavioral disturbance: Secondary | ICD-10-CM | POA: Diagnosis not present

## 2015-05-30 DIAGNOSIS — Z7952 Long term (current) use of systemic steroids: Secondary | ICD-10-CM | POA: Diagnosis not present

## 2015-05-30 DIAGNOSIS — Z7902 Long term (current) use of antithrombotics/antiplatelets: Secondary | ICD-10-CM | POA: Insufficient documentation

## 2015-05-30 DIAGNOSIS — F329 Major depressive disorder, single episode, unspecified: Secondary | ICD-10-CM | POA: Insufficient documentation

## 2015-05-30 DIAGNOSIS — Z792 Long term (current) use of antibiotics: Secondary | ICD-10-CM | POA: Insufficient documentation

## 2015-05-30 DIAGNOSIS — Z9181 History of falling: Secondary | ICD-10-CM | POA: Insufficient documentation

## 2015-05-30 DIAGNOSIS — H2013 Chronic iridocyclitis, bilateral: Secondary | ICD-10-CM | POA: Insufficient documentation

## 2015-05-30 DIAGNOSIS — Z9841 Cataract extraction status, right eye: Secondary | ICD-10-CM | POA: Insufficient documentation

## 2015-05-30 DIAGNOSIS — H9193 Unspecified hearing loss, bilateral: Secondary | ICD-10-CM | POA: Diagnosis not present

## 2015-05-30 DIAGNOSIS — E785 Hyperlipidemia, unspecified: Secondary | ICD-10-CM | POA: Diagnosis not present

## 2015-05-30 DIAGNOSIS — I10 Essential (primary) hypertension: Secondary | ICD-10-CM | POA: Insufficient documentation

## 2015-05-30 DIAGNOSIS — N401 Enlarged prostate with lower urinary tract symptoms: Secondary | ICD-10-CM | POA: Insufficient documentation

## 2015-05-30 DIAGNOSIS — I252 Old myocardial infarction: Secondary | ICD-10-CM | POA: Diagnosis not present

## 2015-05-30 HISTORY — DX: Raynaud's syndrome without gangrene: I73.00

## 2015-05-30 HISTORY — PX: FOOT ARTHRODESIS: SHX1655

## 2015-05-30 HISTORY — DX: Cerebral infarction, unspecified: I63.9

## 2015-05-30 HISTORY — DX: Repeated falls: R29.6

## 2015-05-30 HISTORY — DX: Asperger's syndrome: F84.5

## 2015-05-30 HISTORY — DX: Benign prostatic hyperplasia without lower urinary tract symptoms: N40.0

## 2015-05-30 HISTORY — DX: Unspecified focal traumatic brain injury with loss of consciousness status unknown, initial encounter: S06.30AA

## 2015-05-30 HISTORY — DX: Scoliosis, unspecified: M41.9

## 2015-05-30 HISTORY — DX: Unspecified intracranial injury with loss of consciousness of unspecified duration, initial encounter: S06.9X9A

## 2015-05-30 HISTORY — DX: Synovitis and tenosynovitis, unspecified: M65.9

## 2015-05-30 HISTORY — DX: Headache: R51

## 2015-05-30 HISTORY — DX: Obsessive-compulsive disorder, unspecified: F42.9

## 2015-05-30 HISTORY — DX: Atherosclerotic heart disease of native coronary artery without angina pectoris: I25.10

## 2015-05-30 HISTORY — DX: Mild cognitive impairment of uncertain or unknown etiology: G31.84

## 2015-05-30 HISTORY — DX: Unspecified synovitis and tenosynovitis, unspecified site: M65.90

## 2015-05-30 HISTORY — DX: Headache, unspecified: R51.9

## 2015-05-30 SURGERY — FUSION, JOINT, FOOT
Anesthesia: Monitor Anesthesia Care | Laterality: Left | Wound class: Clean

## 2015-05-30 MED ORDER — OXYCODONE HCL 5 MG/5ML PO SOLN: 5.0000 mg | Freq: Once | ORAL | Status: DC | PRN

## 2015-05-30 MED ORDER — LABETALOL HCL 5 MG/ML IV SOLN: 5.0000 mg | INTRAVENOUS | Status: DC | PRN

## 2015-05-30 MED ORDER — BUPIVACAINE HCL (PF) 0.25 % IJ SOLN
INTRAMUSCULAR | Status: DC | PRN
  Administered 2015-05-30: 10 mL

## 2015-05-30 MED ORDER — FENTANYL CITRATE (PF) 100 MCG/2ML IJ SOLN
INTRAMUSCULAR | Status: DC | PRN
  Administered 2015-05-30: 100 ug via INTRAVENOUS

## 2015-05-30 MED ORDER — OXYCODONE-ACETAMINOPHEN 5-325 MG PO TABS: 1.0000 | ORAL_TABLET | ORAL | Status: DC | PRN

## 2015-05-30 MED ORDER — DEXAMETHASONE SODIUM PHOSPHATE 4 MG/ML IJ SOLN
INTRAMUSCULAR | Status: DC | PRN
  Administered 2015-05-30: 4 mg via INTRAVENOUS

## 2015-05-30 MED ORDER — CEFAZOLIN SODIUM-DEXTROSE 2-3 GM-% IV SOLR
2.0000 g | Freq: Once | INTRAVENOUS | Status: AC
  Administered 2015-05-30: 2 g via INTRAVENOUS

## 2015-05-30 MED ORDER — ONDANSETRON HCL 4 MG/2ML IJ SOLN: 4.0000 mg | Freq: Once | INTRAMUSCULAR | Status: DC | PRN

## 2015-05-30 MED ORDER — LACTATED RINGERS IV SOLN
INTRAVENOUS | Status: DC
  Administered 2015-05-30: 12:00:00 via INTRAVENOUS

## 2015-05-30 MED ORDER — OXYCODONE HCL 5 MG PO TABS: 5.0000 mg | ORAL_TABLET | Freq: Once | ORAL | Status: DC | PRN

## 2015-05-30 MED ORDER — LACTATED RINGERS IV SOLN: 500.0000 mL | INTRAVENOUS | Status: DC

## 2015-05-30 MED ORDER — PROPOFOL 500 MG/50ML IV EMUL
INTRAVENOUS | Status: DC | PRN
  Administered 2015-05-30: 75 ug/kg/min via INTRAVENOUS

## 2015-05-30 MED ORDER — ROPIVACAINE HCL 5 MG/ML IJ SOLN
INTRAMUSCULAR | Status: DC | PRN
  Administered 2015-05-30: 15 mL via PERINEURAL
  Administered 2015-05-30: 35 mL via PERINEURAL

## 2015-05-30 MED ORDER — FENTANYL CITRATE (PF) 100 MCG/2ML IJ SOLN: 25.0000 ug | INTRAMUSCULAR | Status: DC | PRN

## 2015-05-30 SURGICAL SUPPLY — 60 items
BENZOIN TINCTURE PRP APPL 2/3 (GAUZE/BANDAGES/DRESSINGS) IMPLANT
BIOMET CANNULATED DRILL WITH ZH ×2 IMPLANT
BIOMET GUIDE PIN DRILL ×2 IMPLANT
BIT DRILL WIRE PASS (DRILL) ×1 IMPLANT
BLADE MED AGGRESSIVE (BLADE) IMPLANT
BLADE OSC/SAGITTAL 5.5X25 (BLADE) ×2 IMPLANT
BLADE OSC/SAGITTAL MD 5.5X18 (BLADE) IMPLANT
BLADE OSC/SAGITTAL MD 9X18.5 (BLADE) IMPLANT
BLADE SURG 15 STRL LF DISP TIS (BLADE) IMPLANT
BLADE SURG 15 STRL SS (BLADE)
BNDG ESMARK 4X12 TAN STRL LF (GAUZE/BANDAGES/DRESSINGS) ×2 IMPLANT
BNDG GAUZE 4.5X4.1 6PLY STRL (MISCELLANEOUS) ×2 IMPLANT
BNDG STRETCH 4X75 STRL LF (GAUZE/BANDAGES/DRESSINGS) ×2 IMPLANT
CANISTER SUCT 1200ML W/VALVE (MISCELLANEOUS) ×2 IMPLANT
COVER LIGHT HANDLE UNIVERSAL (MISCELLANEOUS) ×4 IMPLANT
COVER PIN YLW 0.028-062 (MISCELLANEOUS) IMPLANT
CUFF TOURN SGL QUICK 18 (TOURNIQUET CUFF) ×2 IMPLANT
DRAPE FLUOR MINI C-ARM 54X84 (DRAPES) ×2 IMPLANT
DRILL WIRE PASS (DRILL) ×2
DURAPREP 26ML APPLICATOR (WOUND CARE) ×2 IMPLANT
GAUZE PETRO XEROFOAM 1X8 (MISCELLANEOUS) ×2 IMPLANT
GAUZE SPONGE 4X4 12PLY STRL (GAUZE/BANDAGES/DRESSINGS) ×2 IMPLANT
GLOVE BIO SURGEON STRL SZ7.5 (GLOVE) ×2 IMPLANT
GLOVE INDICATOR 8.0 STRL GRN (GLOVE) ×2 IMPLANT
GOWN STRL REUS W/ TWL LRG LVL3 (GOWN DISPOSABLE) ×2 IMPLANT
GOWN STRL REUS W/TWL LRG LVL3 (GOWN DISPOSABLE) ×2
GRAFT TRINITY ELITE LGE HUMAN (Tissue) ×2 IMPLANT
K-WIRE DBL END TROCAR 6X.045 (WIRE)
K-WIRE DBL END TROCAR 6X.062 (WIRE) ×2
KIT ROOM TURNOVER OR (KITS) ×2 IMPLANT
KWIRE DBL END TROCAR 6X.045 (WIRE) IMPLANT
KWIRE DBL END TROCAR 6X.062 (WIRE) ×1 IMPLANT
NS IRRIG 500ML POUR BTL (IV SOLUTION) ×2 IMPLANT
PACK EXTREMITY ARMC (MISCELLANEOUS) ×2 IMPLANT
PAD GROUND ADULT SPLIT (MISCELLANEOUS) ×2 IMPLANT
RASP SM TEAR CROSS CUT (RASP) ×4 IMPLANT
SCREW CANN 6.5 75MM (Screw) ×1 IMPLANT
SCREW CANN 6.5 80MM (Screw) ×1 IMPLANT
SCREW CANN LG 6.5 FLT 75X22 (Screw) ×1 IMPLANT
SCREW CANN LG 6.5 FLT 80X22 (Screw) ×1 IMPLANT
SPLINT CAST 1 STEP 4X30 (MISCELLANEOUS) IMPLANT
STOCKINETTE STRL 6IN 960660 (GAUZE/BANDAGES/DRESSINGS) ×2 IMPLANT
STRAP BODY AND KNEE 60X3 (MISCELLANEOUS) ×2 IMPLANT
STRIP CLOSURE SKIN 1/4X4 (GAUZE/BANDAGES/DRESSINGS) IMPLANT
SUT ETHILON 4-0 (SUTURE) ×1
SUT ETHILON 4-0 FS2 18XMFL BLK (SUTURE) ×1
SUT ETHILON 5-0 FS-2 18 BLK (SUTURE) IMPLANT
SUT MNCRL 4-0 (SUTURE)
SUT MNCRL 4-0 27XMFL (SUTURE)
SUT MNCRL 5-0+ PC-1 (SUTURE) ×1 IMPLANT
SUT MONOCRYL 5-0 (SUTURE) ×1
SUT VIC AB 0 CT1 36 (SUTURE) IMPLANT
SUT VIC AB 2-0 SH 27 (SUTURE)
SUT VIC AB 2-0 SH 27XBRD (SUTURE) IMPLANT
SUT VIC AB 3-0 SH 27 (SUTURE)
SUT VIC AB 3-0 SH 27X BRD (SUTURE) IMPLANT
SUT VIC AB 4-0 FS2 27 (SUTURE) ×2 IMPLANT
SUT VICRYL AB 3-0 FS1 BRD 27IN (SUTURE) ×2 IMPLANT
SUTURE ETHLN 4-0 FS2 18XMF BLK (SUTURE) ×1 IMPLANT
SUTURE MNCRL 4-0 27XMF (SUTURE) IMPLANT

## 2015-05-30 NOTE — Transfer of Care (Signed)
Immediate Anesthesia Transfer of Care Note  Patient: Blake [name]  Procedure(s) Performed: Procedure(s) with comments: ARTHRODESIS FOOT STJ LT FOOT (Left) - WITH POPLITEAL BLOCK  Patient Location: PACU  Anesthesia Type: Regional, MAC  Level of Consciousness: awake, alert  and patient cooperative  Airway and Oxygen Therapy: Patient Spontanous Breathing and Patient connected to supplemental oxygen  Post-op Assessment: Post-op Vital signs reviewed, Patient's Cardiovascular Status Stable, Respiratory Function Stable, Patent Airway and No signs of Nausea or vomiting  Post-op Vital Signs: Reviewed and stable  Complications: No apparent anesthesia complications

## 2015-05-30 NOTE — Progress Notes (Signed)
Assisted Blake [name] ANMD with left, ultrasound guided, popliteal/saphenous block. Side rails up, monitors on throughout procedure. See vital signs in flow sheet. Tolerated Procedure well.   

## 2015-05-30 NOTE — H&P (Signed)
  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  The various methods of treatment have been discussed with the patient.  No guarantees were given.  After consideration of risks, benefits and other options for treatment, the patient has consented to surgery.  I have reviewed the patients' chart and labs.    Patient Vitals for the past 24 hrs:  BP Temp Pulse Resp SpO2 Height Weight  05/30/15 1136 (!) 153/99 mmHg 97.7 F (36.5 C) 74 16 99 % 5\' 7"  (1.702 m) 86.183 kg (190 lb)    A history and physical examination was performed in my office.  The patient was reexamined.  There have been no changes to this history and physical examination.Left subtalar joint fusion  Blake [name] A

## 2015-05-30 NOTE — Discharge Instructions (Signed)
[facility]  POST OPERATIVE INSTRUCTIONS FOR DR. TROXLER AND DR. Genevieve Norlander CLINIC PODIATRY DEPARTMENT   1. Take your medication as prescribed.  Pain medication should be taken only as needed.  2. Keep the dressing clean, dry and intact.  3. Keep your foot elevated above the heart level for the first 48 hours.  4. Walking to the bathroom and brief periods of walking are acceptable, unless we have instructed you to be non-weight bearing.  5. Always wear your post-op shoe when walking.  Always use your crutches if you are to be non-weight bearing.  6. Do not take a shower. Do not get bandage or equalizer walker boot wet.  7. Every hour you are awake:  - Bend your knee 15 times. - Massage calf 15 times  8. Call [facility] [phone]) if any of the following problems occur: - You develop a temperature or fever. - The bandage becomes saturated with blood. - Medication does not stop your pain. - Injury of the foot occurs. - Any symptoms of infection including redness, odor, or red streaks running from wound.  General Anesthesia, Adult, Care After Refer to this sheet in the next few weeks. These instructions provide you with information on caring for yourself after your procedure. Your health care provider may also give you more specific instructions. Your treatment has been planned according to current medical practices, but problems sometimes occur. Call your health care provider if you have any problems or questions after your procedure. WHAT TO EXPECT AFTER THE PROCEDURE After the procedure, it is typical to experience:  Sleepiness.  Nausea and vomiting. HOME CARE INSTRUCTIONS  For the first 24 hours after general anesthesia:  Have a responsible person with you.  Do not drive a car. If you are alone, do not take public transportation.  Do not drink alcohol.  Do not take medicine that has not been prescribed by your  health care provider.  Do not sign important papers or make important decisions.  You may resume a normal diet and activities as directed by your health care provider.  Change bandages (dressings) as directed.  If you have questions or problems that seem related to general anesthesia, call the hospital and ask for the anesthetist or anesthesiologist on call. SEEK MEDICAL CARE IF:  You have nausea and vomiting that continue the day after anesthesia.  You develop a rash. SEEK IMMEDIATE MEDICAL CARE IF:   You have difficulty breathing.  You have chest pain.  You have any allergic problems.   This information is not intended to replace advice given to you by your health care provider. Make sure you discuss any questions you have with your health care provider.   Document Released: 07/14/2000 Document Revised: 04/28/2014 Document Reviewed: 08/06/2011 Elsevier Interactive Patient Education [employer]

## 2015-05-30 NOTE — Anesthesia Procedure Notes (Addendum)
Anesthesia Regional Block:  Popliteal block  Pre-Anesthetic Checklist: ,, timeout performed, Correct Patient, Correct Site, Correct Laterality, Correct Procedure, Correct Position, site marked, Risks and benefits discussed,  Surgical consent,  Pre-op evaluation,  At surgeon's request and post-op pain management  Laterality: Left  Prep: chloraprep       Needles:  Injection technique: Single-shot     Needle Length: 4cm 4 cm Needle Gauge: 21 and 21 G    Additional Needles:  Procedures: ultrasound guided (picture in chart) Popliteal block Narrative:  Start time: 05/30/2015 12:15 PM End time: 05/30/2015 12:30 PM Injection made incrementally with aspirations every 35 mL.  Performed by: Personally  Anesthesiologist: Johny Blamer D   Anesthesia Regional Block:  Adductor canal block  Pre-Anesthetic Checklist: ,, timeout performed, Correct Patient, Correct Site, Correct Laterality, Correct Procedure, Correct Position, site marked, Risks and benefits discussed,  Surgical consent,  Pre-op evaluation,  At surgeon's request and post-op pain management  Laterality: Left  Prep: chloraprep       Needles:   Needle Type: Stimiplex     Needle Length: 4cm 4 cm Needle Gauge: 21 and 21 G    Additional Needles:  Procedures: ultrasound guided (picture in chart) Adductor canal block Narrative:  Start time: 05/30/2015 12:15 PM End time: 05/30/2015 12:30 PM Injection made incrementally with aspirations every 15 mL.  Performed by: Personally  Anesthesiologist: Johny Blamer D   Procedure Name: MAC Performed by: Jimmy Picket Pre-anesthesia Checklist: Patient identified, Emergency Drugs available, Suction available, Timeout performed and Patient being monitored Patient Re-evaluated:Patient Re-evaluated prior to inductionOxygen Delivery Method: Simple face mask Placement Confirmation: positive ETCO2

## 2015-05-30 NOTE — Anesthesia Preprocedure Evaluation (Addendum)
Anesthesia Evaluation  Patient identified by MRN, date of birth, ID band Patient awake    Reviewed: Allergy & Precautions, H&P , NPO status , Patient's Chart, lab work & pertinent test results, reviewed documented beta blocker date and time   Airway Mallampati: II  TM Distance: >3 FB Neck ROM: full    Dental no notable dental hx.    Pulmonary neg pulmonary ROS,    Pulmonary exam normal breath sounds clear to auscultation       Cardiovascular Exercise Tolerance: Good hypertension, + CAD, + Past MI and + Peripheral Vascular Disease   Rhythm:regular Rate:Normal     Neuro/Psych  Headaches, PSYCHIATRIC DISORDERS CVA, Residual Symptoms    GI/Hepatic Neg liver ROS, GERD  Medicated,  Endo/Other  negative endocrine ROS  Renal/GU negative Renal ROS  negative genitourinary   Musculoskeletal  (+) Arthritis , Rheumatoid disorders,    Abdominal   Peds  Hematology negative hematology ROS (+)   Anesthesia Other Findings   Reproductive/Obstetrics negative OB ROS                            Anesthesia Physical Anesthesia Plan  ASA: II  Anesthesia Plan: Regional and MAC   Post-op Pain Management:    Induction:   Airway Management Planned:   Additional Equipment:   Intra-op Plan:   Post-operative Plan:   Informed Consent: I have reviewed the patients History and Physical, chart, labs and discussed the procedure including the risks, benefits and alternatives for the proposed anesthesia with the patient or authorized representative who has indicated his/her understanding and acceptance.     Plan Discussed with: CRNA  Anesthesia Plan Comments:        Anesthesia Quick Evaluation

## 2015-05-30 NOTE — Op Note (Signed)
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 that presents today for surgical intervention.  The risks/benefits/alternatives/complications have been discussed and consent has been given.    Procedure:  Patient was brought into the OR and placed on the operating table in thesupine position. After anesthesia was obtained theleft lower extremity was prepped and draped in usual sterile fashion.  Attention was directed to the lateral aspect of the subtalar joint where after inflation of the tourniquet a lateral subtalar joint sinus tarsi incision was performed. Sharp and blunt dissection was carried down to the deeper layers. The EDB muscle belly was noted and retracted distally. At this time the subtalar joint was entered. There was noted be a large amount of scar tissue with small areas of cartilaginous material. With a combination of curettes I was able to remove all the articular cartilage remaining. Next area was burred with a 4.0 mm a. Finally the subtalar joint was drilled with a 1.5 mm drill bit through the subchondral bone plate. This was taken down to good healthy bleeding tissue. At this time the subtalar joint was then packed with Trinity bone graft. 10 cc of bone graft was used. Attention was then directed to the posterior aspect of the calcaneus where the guidewire was driven from the posterior calcaneus crossing the subtalar joint. Multiple views of fluoroscopy revealed good alignment. At this time a 6.5 mm screw was driven across the subtalar joint with excellent compression and stability. Next a second  subtalar joint fusion screw was placed just medial to the previous screw. This was placed across the subtalar joint in a good aligned position. Multiple views of fluoroscopy revealed good placement of the screws. All areas then flushed with irrigation. Closure was performed to the posterior heel with a 3-0 nylon. Layered closure was performed with a 3-0 Vicryl to the lateral subtalar joint and 3-0 nylon for skin. 0.25% Marcaine was placed around all areas. He was then placed in a well compressive sterile dressing.    Patient tolerated the procedure and anesthesia well.  Was transported from the OR to the PACU with all vital signs stable and vascular status intact. To be discharged per routine protocol.  Will follow up in approximately 1 week in the outpatient clinic.

## 2015-05-30 NOTE — Anesthesia Postprocedure Evaluation (Signed)
Anesthesia Post Note  Patient: Blake [name]  Procedure(s) Performed: Procedure(s) (LRB): ARTHRODESIS FOOT STJ LT FOOT (Left)  Patient location during evaluation: PACU Anesthesia Type: Regional and MAC Level of consciousness: awake and alert Pain management: pain level controlled Vital Signs Assessment: post-procedure vital signs reviewed and stable Respiratory status: spontaneous breathing, nonlabored ventilation and respiratory function stable Cardiovascular status: blood pressure returned to baseline and stable Postop Assessment: no signs of nausea or vomiting Anesthetic complications: no    DANIEL D KOVACS

## 2015-05-31 ENCOUNTER — Encounter: Payer: Self-pay | Admitting: Podiatry

## 2015-11-23 ENCOUNTER — Other Ambulatory Visit: Payer: Self-pay | Admitting: Podiatry

## 2015-11-23 DIAGNOSIS — M25572 Pain in left ankle and joints of left foot: Secondary | ICD-10-CM

## 2015-12-06 ENCOUNTER — Ambulatory Visit
Admission: RE | Admit: 2015-12-06 | Discharge: 2015-12-06 | Disposition: A | Discharge status: Home / Self Care | Payer: Medicare Other | Source: Ambulatory Visit | Attending: Podiatry | Admitting: Podiatry

## 2015-12-06 DIAGNOSIS — M25572 Pain in left ankle and joints of left foot: Secondary | ICD-10-CM

## 2015-12-06 DIAGNOSIS — M19072 Primary osteoarthritis, left ankle and foot: Secondary | ICD-10-CM | POA: Diagnosis not present

## 2016-03-06 DIAGNOSIS — R4189 Other symptoms and signs involving cognitive functions and awareness: Secondary | ICD-10-CM

## 2016-03-06 DIAGNOSIS — S069XAS Unspecified intracranial injury with loss of consciousness status unknown, sequela: Secondary | ICD-10-CM | POA: Insufficient documentation

## 2016-03-06 DIAGNOSIS — R41844 Frontal lobe and executive function deficit: Secondary | ICD-10-CM

## 2016-03-06 DIAGNOSIS — F068 Other specified mental disorders due to known physiological condition: Secondary | ICD-10-CM | POA: Insufficient documentation

## 2016-03-06 DIAGNOSIS — S069X0S Unspecified intracranial injury without loss of consciousness, sequela: Secondary | ICD-10-CM

## 2016-03-06 DIAGNOSIS — F339 Major depressive disorder, recurrent, unspecified: Secondary | ICD-10-CM

## 2016-03-06 HISTORY — DX: Major depressive disorder, recurrent, unspecified: F33.9

## 2016-03-06 HISTORY — DX: Other specified mental disorders due to known physiological condition: F06.8

## 2016-03-06 HISTORY — DX: Frontal lobe and executive function deficit: R41.844

## 2016-03-06 HISTORY — DX: Unspecified intracranial injury without loss of consciousness, sequela: S06.9X0S

## 2016-03-06 HISTORY — DX: Other symptoms and signs involving cognitive functions and awareness: R41.89

## 2016-03-19 ENCOUNTER — Emergency Department
Admission: EM | Admit: 2016-03-19 | Discharge: 2016-03-19 | Disposition: A | Discharge status: Home / Self Care | Payer: Medicare Other | Attending: Emergency Medicine | Admitting: Emergency Medicine

## 2016-03-19 DIAGNOSIS — I2510 Atherosclerotic heart disease of native coronary artery without angina pectoris: Secondary | ICD-10-CM | POA: Diagnosis not present

## 2016-03-19 DIAGNOSIS — Z79899 Other long term (current) drug therapy: Secondary | ICD-10-CM | POA: Diagnosis not present

## 2016-03-19 DIAGNOSIS — Z7982 Long term (current) use of aspirin: Secondary | ICD-10-CM | POA: Insufficient documentation

## 2016-03-19 DIAGNOSIS — I10 Essential (primary) hypertension: Secondary | ICD-10-CM | POA: Diagnosis not present

## 2016-03-19 LAB — CBC
HCT: 46.6 % (ref 40.0–52.0)
HEMOGLOBIN: 15.7 g/dL (ref 13.0–18.0)
MCH: 32.2 pg (ref 26.0–34.0)
MCHC: 33.6 g/dL (ref 32.0–36.0)
MCV: 95.8 fL (ref 80.0–100.0)
Platelets: 228 10*3/uL (ref 150–440)
RBC: 4.87 MIL/uL (ref 4.40–5.90)
RDW: 14.5 % (ref 11.5–14.5)
WBC: 5 10*3/uL (ref 3.8–10.6)

## 2016-03-19 LAB — BASIC METABOLIC PANEL
Anion gap: 6 (ref 5–15)
BUN: 16 mg/dL (ref 6–20)
CALCIUM: 9.5 mg/dL (ref 8.9–10.3)
CHLORIDE: 107 mmol/L (ref 101–111)
CO2: 27 mmol/L (ref 22–32)
CREATININE: 1.14 mg/dL (ref 0.61–1.24)
GFR calc non Af Amer: >60 mL/min (ref >60)
Glucose, Bld: 101 mg/dL — ABNORMAL HIGH (ref 65–99)
Potassium: 4 mmol/L (ref 3.5–5.1)
SODIUM: 140 mmol/L (ref 135–145)

## 2016-03-19 LAB — TROPONIN I

## 2016-03-19 MED ORDER — LISINOPRIL 40 MG PO TABS: 40.0000 mg | ORAL_TABLET | Freq: Every day | ORAL | 0 refills | Status: DC

## 2016-03-19 NOTE — ED Triage Notes (Signed)
Pt wife checked BP today and was high multiple times. Pt dose of lisinopril adjusted last week due to high readings. Pt alert and oriented X4, active, cooperative, pt in NAD. RR even and unlabored, color WNL.

## 2016-03-19 NOTE — ED Notes (Signed)
Pt verbalized understanding of discharge instructions. NAD at this time. 

## 2016-03-19 NOTE — ED Provider Notes (Signed)
[facility] Emergency Department Provider Note  ____________________________________________  Time seen: Approximately 3:57 PM  I have reviewed the triage vital signs and the nursing notes.   HISTORY  Chief Complaint Hypertension    HPI Blake [name] is a 65 y.o. male who presents emergency Department with significant medical history of Asperger's, mild dementia, CAD, GERD, hypertension, TBI, MI, CVA/TIA. Patient presents emergency department with his wife for complaint of increased hypertension over the past 2 weeks. Patient does have a diagnosis of hypertension and has been on lisinopril 5 mg tablets daily. Per the wife, the patient has had some mild changes from baseline over the last 2 weeks to include some increase in rate and on symptoms, increasing fatigue, increased hypertension. Patient and wife deny any headache, acute vision changes, chest pain, shortness of breath, abdominal pain, nausea or vomiting.Patient has had some mild dyspnea on exertion. The wife reports that she noticed the blood pressure had been elevated over a week. In the contacted primary care. Patient's dose was increased to 10 mg lisinopril daily. Per the wife, initially this corrected blood pressure and he had normal readings of approximately 120s over 70s. The wife reports over the last 2-3 days blood pressure has elevated and the highest reading today was 160/106. They attempted to contact primary care's office by primary care was unavailable at the time and office staff advised them to present to the emergency department for evaluation. While here in the emergency department, primary care call patient back and advised the patient to continue evaluation here in the emergency department but he may increase his blood pressure medication to 40 mg daily. Patient denies any recent illnesses. No other complaints today.   Past Medical History:  Diagnosis Date  . Asperger's syndrome    (per  wife)  . Coronary artery disease   . Dementia   . Depression   . GERD (gastroesophageal reflux disease)   . Headache    stress  . Hearing loss   . Hyperlipidemia   . Hypertension   . 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 Myocardial infarction 08/2013  . OCD (obsessive compulsive disorder)   . Prostatic hypertrophy   . Raynaud's disease   . Repeated falls    weak left ankle  . Scoliosis   . Stroke (HCC) 2/16   "light"  . Synovitis    knees, ankles    There are no active problems to display for this patient.   Past Surgical History:  Procedure Laterality Date  . BACK SURGERY  2011   rods and screws  . COLONOSCOPY  2015  . CYSTOSCOPY  1984  . EYE SURGERY Bilateral 2005,2006,2009   detached retina, cataract  . FOOT ARTHRODESIS Left 05/30/2015   Procedure: ARTHRODESIS FOOT STJ LT FOOT;  Surgeon: [name], DPM;  Location: [facility];  Service: Podiatry;  Laterality: Left;  WITH POPLITEAL BLOCK  . FOOT SURGERY Left   . HERNIA REPAIR Right 1969   inguinal  . SHOULDER SURGERY Right 2004  . skull surgery    . TOE SURGERY Right 2009  . TONSILLECTOMY  2008    Prior to Admission medications   Medication Sig Start Date End Date Taking? Authorizing Provider  aspirin 81 MG tablet Take 81 mg by mouth daily.    Historical Provider, MD  calcium-vitamin D (OSCAL WITH D)  500-200 MG-UNIT tablet Take 1 tablet by mouth.    Historical Provider, MD  clindamycin (CLEOCIN T) 1 % lotion Apply topically 2 (two) times daily as needed.    Historical Provider, MD  clopidogrel (PLAVIX) 75 MG tablet Take 75 mg by mouth daily.    Historical Provider, MD  donepezil (ARICEPT) 10 MG tablet Take 10 mg by mouth at bedtime.    Historical Provider, MD  DULOXETINE HCL PO Take 60 mg by mouth daily.     Historical Provider, MD  FIBER PO Take 1 tablet by mouth 3 (three) times daily.    Historical Provider, MD   fluticasone (VERAMYST) 27.5 MCG/SPRAY nasal spray Place 1 spray into the nose daily.    Historical Provider, MD  folic acid (FOLVITE) 1 MG tablet Take 1 mg by mouth daily.    Historical Provider, MD  lisinopril (PRINIVIL,ZESTRIL) 40 MG tablet Take 1 tablet (40 mg total) by mouth daily. 03/19/16 04/02/16  Christiane Ha D Sybella Harnish, PA-C  methotrexate (RHEUMATREX) 2.5 MG tablet Take 20 mg by mouth once a week.     Historical Provider, MD  methylphenidate (RITALIN) 10 MG tablet Take 10 mg by mouth 2 (two) times daily. 27 mg AM, 10 mg afternoon    Historical Provider, MD  oxyCODONE-acetaminophen (ROXICET) 5-325 MG tablet Take 1-2 tablets by mouth every 4 (four) hours as needed for severe pain. 05/30/15   [name], DPM  prednisoLONE acetate (PRED FORTE) 1 % ophthalmic suspension Place 1 drop into the right eye 4 (four) times daily.    Historical Provider, MD  Probiotic Product (PROBIOTIC DAILY PO) Take by mouth daily.    Historical Provider, MD  RABEprazole (ACIPHEX) 20 MG tablet Take 20 mg by mouth daily.    Historical Provider, MD  silodosin (RAPAFLO) 8 MG CAPS capsule Take 8 mg by mouth daily with breakfast.    Historical Provider, MD  tadalafil (CIALIS) 20 MG tablet Take 20 mg by mouth daily as needed for erectile dysfunction.    Historical Provider, MD  terazosin (HYTRIN) 1 MG capsule Take 1 mg by mouth at bedtime.    Historical Provider, MD  Testosterone 30 MG/ACT SOLN Place onto the skin. Reported on 05/23/2015    Historical Provider, MD  testosterone cypionate (DEPOTESTOTERONE CYPIONATE) 100 MG/ML injection Inject into the muscle every 14 (fourteen) days. For IM use only    Historical Provider, MD    Allergies Patient has no known allergies.  No family history on file.  Social History Social History  Substance Use Topics  . Smoking status: Never Smoker  . Smokeless tobacco: Never Used  . Alcohol use No     Review of Systems  Constitutional: No fever/chills. Positive for increased  blood pressure Eyes: No visual changes.  ENT: No upper respiratory complaints. Cardiovascular: no chest pain. Respiratory: no cough. No SOB. Gastrointestinal: No abdominal pain.  No nausea, no vomiting.  No diarrhea.  No constipation. Genitourinary: Negative for dysuria. No hematuria Musculoskeletal: Negative for musculoskeletal pain. Skin: Negative for rash, abrasions, lacerations, ecchymosis. Neurological: Negative for headaches, focal weakness or numbness. 10-point ROS otherwise negative.  ____________________________________________   PHYSICAL EXAM:  VITAL SIGNS: ED Triage Vitals  Enc Vitals Group     BP 03/19/16 1332 (!) 152/95     Pulse Rate 03/19/16 1332 65     Resp 03/19/16 1332 18     Temp 03/19/16 1332 97.7 F (36.5 C)     Temp Source 03/19/16 1332 Oral     SpO2 03/19/16  1332 99 %     Weight 03/19/16 1333 190 lb (86.2 kg)     Height --      Head Circumference --      Peak Flow --      Pain Score 03/19/16 1333 6     Pain Loc --      Pain Edu? --      Excl. in GC? --      Constitutional: Alert and oriented. Well appearing and in no acute distress. Eyes: Conjunctivae are normal. PERRL. EOMI. Head: Atraumatic. ENT:      Ears:       Nose: No congestion/rhinnorhea.      Mouth/Throat: Mucous membranes are moist.  Neck: No stridor. Neck is supple with full range of motion Hematological/Lymphatic/Immunilogical: No cervical lymphadenopathy. Cardiovascular: Normal rate, regular rhythm. Normal S1 and S2.  Good peripheral circulation. Respiratory: Normal respiratory effort without tachypnea or retractions. Lungs CTAB. Good air entry to the bases with no decreased or absent breath sounds. Gastrointestinal: Bowel sounds 4 quadrants. Soft and nontender to palpation. No guarding or rigidity. No palpable masses. No distention.  Musculoskeletal: Full range of motion to all extremities. No gross deformities appreciated. Neurologic:  Normal speech and language. No gross focal  neurologic deficits are appreciated. Radial nerves II through XII are grossly intact Skin:  Skin is warm, dry and intact. No rash noted. Psychiatric: Mood and affect are normal. Speech and behavior are normal. Patient exhibits appropriate insight and judgement.   ____________________________________________   LABS (all labs ordered are listed, but only abnormal results are displayed)  Labs Reviewed  BASIC METABOLIC PANEL - Abnormal; Notable for the following:       Result Value   Glucose, Bld 101 (*)    All other components within normal limits  CBC  TROPONIN I   ____________________________________________  EKG  EKG reveals normal sinus rhythm at a rate of 72 bpm. No acute ST elevations or depressions noted. Mild elevation noted in septal leads which is consistent with previous EKG. No significant changes. PR, QRS, QT intervals within normal limits. Normal axis. No Q waves are delta waves identified. ____________________________________________  RADIOLOGY   No results found.  ____________________________________________    PROCEDURES  Procedure(s) performed:    Procedures    Medications - No data to display   ____________________________________________   INITIAL IMPRESSION / ASSESSMENT AND PLAN / ED COURSE  Pertinent labs & imaging results that were available during my care of the patient were reviewed by me and considered in my medical decision making (see chart for details).  Review of the [place] CSRS was performed in accordance of the [facility] prior to dispensing any controlled drugs.  Clinical Course     Patient's diagnosis is consistent with Increased hypertension. Patient has had problems with increased hypertension over the last 2 weeks. Lisinopril was increased to 10 mg daily from primary care office. Initially this has corrected symptoms but patient has had increased hypertension over the last 2-3 days. Patient does not have any concerning symptoms to  include headaches, visual changes, chest pain, shortness of breath. Patient has had some mild dyspnea on exertion over the last month 2 months. EKG returns with no acute findings/changes from previous EKG. Patient's exam is reassuring with no acute findings. Labs returned with reassuring results... Patient will be discharged home with instructions to increase his lisinopril dose to 40 mg daily. Patient has appointment with PCP in 3 days and will follow up with primary care that time.  Patient is given ED precautions to return to the ED for any worsening or new symptoms.     ____________________________________________  FINAL CLINICAL IMPRESSION(S) / ED DIAGNOSES  Final diagnoses:  Essential hypertension      NEW MEDICATIONS STARTED DURING THIS VISIT:  New Prescriptions   LISINOPRIL (PRINIVIL,ZESTRIL) 40 MG TABLET    Take 1 tablet (40 mg total) by mouth daily.        This chart was dictated using voice recognition software/Dragon. Despite best efforts to proofread, errors can occur which can change the meaning. Any change was purely unintentional.    Racheal Patches, PA-C 03/19/16 1714    Charlynne Pander, MD 03/19/16 2326

## 2016-04-03 DIAGNOSIS — G8929 Other chronic pain: Secondary | ICD-10-CM

## 2016-04-03 DIAGNOSIS — M25512 Pain in left shoulder: Secondary | ICD-10-CM

## 2016-04-03 HISTORY — DX: Other chronic pain: G89.29

## 2016-05-01 ENCOUNTER — Ambulatory Visit: Payer: Medicare Other | Attending: Family Medicine

## 2016-05-01 DIAGNOSIS — R2681 Unsteadiness on feet: Secondary | ICD-10-CM | POA: Insufficient documentation

## 2016-05-01 DIAGNOSIS — R262 Difficulty in walking, not elsewhere classified: Secondary | ICD-10-CM | POA: Diagnosis not present

## 2016-05-01 DIAGNOSIS — R278 Other lack of coordination: Secondary | ICD-10-CM | POA: Insufficient documentation

## 2016-05-01 NOTE — Therapy (Signed)
[facility] MAIN [facility] SERVICES [address] [place], [place], [address] Phone: [phone]   Fax:  [phone]  Physical Therapy Evaluation  Patient Details  Name: Blake [name] MRN: [id number] Date of Birth: 1951/03/09 Referring Provider: Dorothey Baseman MD  Encounter Date: 05/01/2016      PT End of Session - 05/01/16 1552    Visit Number 1   Number of Visits 12   Date for PT Re-Evaluation 06/12/16   Authorization Type 1 /10 G code   PT Start Time 1308   PT Stop Time 1400   PT Time Calculation (min) 52 min   Equipment Utilized During Treatment Gait belt   Activity Tolerance Patient tolerated treatment well   Behavior During Therapy [facility] for tasks assessed/performed      Past Medical History:  Diagnosis Date  . Asperger's syndrome    (per wife)  . Coronary artery disease   . Dementia   . Depression   . GERD (gastroesophageal reflux disease)   . Headache    stress  . Hearing loss   . Hyperlipidemia   . Hypertension   . 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 Myocardial infarction 08/2013  . OCD (obsessive compulsive disorder)   . Prostatic hypertrophy   . Raynaud's disease   . Repeated falls    weak left ankle  . Scoliosis   . Stroke (HCC) 2/16   "light"  . Synovitis    knees, ankles    Past Surgical History:  Procedure Laterality Date  . BACK SURGERY  2011   rods and screws  . COLONOSCOPY  2015  . CYSTOSCOPY  1984  . EYE SURGERY Bilateral 2005,2006,2009   detached retina, cataract  . FOOT ARTHRODESIS Left 05/30/2015   Procedure: ARTHRODESIS FOOT STJ LT FOOT;  Surgeon: [name], DPM;  Location: [facility];  Service: Podiatry;  Laterality: Left;  WITH POPLITEAL BLOCK  . FOOT SURGERY Left   . HERNIA REPAIR Right 1969   inguinal  . SHOULDER SURGERY Right 2004  . skull surgery    . TOE SURGERY Right 2009  .  TONSILLECTOMY  2008    There were no vitals filed for this visit.       Subjective Assessment - 05/01/16 1328    Subjective Difficulty with walking (wide BOS and "limps" to perform and patient reports increased ankle external rotation), balancing, ascending/descending stairs (requires step to gait pattern), transfering in and out of the shower/tub, requires leaning against a stable object to take on and off pants. Patient reports when he's standing his leg "gives out" on the L LE, which he reports happens randomly (monthly). Patient reports he stands for 20% of the time on his R LE.    Pertinent History Hx of TBI (2011); L ankle fx 1 year prior   Limitations Standing;Walking   How long can you walk comfortably? 2 miles    Patient Stated Goals Reduction of pain when walking.    Currently in Pain? Yes   Pain Score 2    Pain Location Ankle   Pain Orientation Left   Pain Descriptors / Indicators Aching;Constant   Pain Type Chronic pain   Pain Onset More than a month ago   Pain Frequency Constant   Aggravating Factors  weight bearing exercises  [facility] PT Assessment - 05/01/16 1323      Assessment   Medical Diagnosis Gait instability   Referring Provider Dorothey Baseman MD   Onset Date/Surgical Date 12/20/09   Hand Dominance Right   Next MD Visit unknown   Prior Therapy yes     Precautions   Precautions Fall     Balance Screen   Has the patient fallen in the past 6 months Yes   How many times? 3   Has the patient had a decrease in activity level because of a fear of falling?  Yes   Is the patient reluctant to leave their home because of a fear of falling?  No     Prior Function   Level of Independence Independent   Vocation Retired   Gaffer N/A   Leisure read newspaper, walk to [employer]   Overall Cognitive Status Within Functional Limits for tasks assessed     ROM / Strength   AROM / PROM / Strength Strength     Strength    Strength Assessment Site Hip;Knee;Ankle   Right/Left Hip Right;Left   Right Hip Flexion 5/5   Right Hip ABduction 4/5   Right Hip ADduction 5/5   Left Hip Flexion 5/5   Left Hip ABduction 4/5   Left Hip ADduction 5/5   Right/Left Knee Right;Left   Right Knee Flexion 4+/5   Right Knee Extension 5/5   Left Knee Flexion 4/5   Left Knee Extension 5/5   Right/Left Ankle Right;Left   Right Ankle Dorsiflexion 5/5   Right Ankle Plantar Flexion 5/5   Left Ankle Dorsiflexion 4-/5   Left Ankle Plantar Flexion 4-/5  Performed in sitting position     Ambulation/Gait   Gait Pattern Decreased stance time - left;Decreased step length - right;Decreased dorsiflexion - left;Decreased weight shift to left;Left hip hike  Wide BOS   Gait velocity 1.59m/s   Gait Comments Patient ambulates with an antalgic gait with decreased time on the L LE     Standardized Balance Assessment   Standardized Balance Assessment Berg Balance Test;Dynamic Gait Index;Timed Up and Go Test;Five Times Sit to Stand;10 meter walk test   Five times sit to stand comments  9sec   10 Meter Walk 1.25 m/s     Berg Balance Test   Sit to Stand Able to stand without using hands and stabilize independently   Standing Unsupported Able to stand safely 2 minutes   Sitting with Back Unsupported but Feet Supported on Floor or Stool Able to sit safely and securely 2 minutes   Stand to Sit Sits safely with minimal use of hands   Transfers Able to transfer safely, minor use of hands   Standing Unsupported with Eyes Closed Able to stand 10 seconds safely   Standing Ubsupported with Feet Together Able to place feet together independently and stand 1 minute safely   From Standing, Reach Forward with Outstretched Arm Can reach confidently >25 cm (10")   From Standing Position, Pick up Object from Floor Able to pick up shoe safely and easily   From Standing Position, Turn to Look Behind Over each Shoulder Looks behind from both sides and  weight shifts well   Turn 360 Degrees Able to turn 360 degrees safely but slowly   Standing Unsupported, Alternately Place Feet on Step/Stool Able to stand independently and complete 8 steps >20 seconds   Standing Unsupported, One Foot in Front Able to take small step independently  and hold 30 seconds   Standing on One Leg Able to lift leg independently and hold 5-10 seconds   Total Score 50     Dynamic Gait Index   Level Surface Normal   Change in Gait Speed Normal   Gait with Horizontal Head Turns Normal   Gait with Vertical Head Turns Normal   Gait and Pivot Turn Mild Impairment   Step Over Obstacle Mild Impairment   Step Around Obstacles Mild Impairment   Steps Moderate Impairment   Total Score 19     Timed Up and Go Test   Normal TUG (seconds) 6.5      TREATMENT: *Single leg stance -- 3 x 20 sec performed B *tandem stance -- 3 x 20sec performed B  *added to HEP and given handout on technique and how to perform each exercise         PT Education - 05/01/16 1551    Education provided Yes   Education Details HEP: single leg stance, tandem stance B   Person(s) Educated Patient   Methods Explanation;Demonstration;Handout   Comprehension Verbalized understanding;Returned demonstration             PT Long Term Goals - 05/01/16 1602      PT LONG TERM GOAL #1   Title Patient will improve BERG to 56/56 to demonstrate significant improvement in static balance and improved ability to donn pants in standing   Baseline BERG 50/56   Time 6   Period Weeks   Status New     PT LONG TERM GOAL #2   Title Patient will improve DGI to 24/24 to demonstrate significant improvement in dynamic balance and ability to walk without falling   Baseline DGI: 19/24   Time 6   Period Weeks   Status New     PT LONG TERM GOAL #3   Title Patient will improve L ankle pain to the worse pain being 2/10 on the VAS to demonstrate significant improvement in pain.    Baseline VAS: 7/10    Time 6   Period Weeks   Status New     PT LONG TERM GOAL #4   Title Patient will be independent with HEP to continues benefits of therapy after discharge.    Baseline dependent with exercise progression and performacne   Time 6   Period Weeks   Status New               Plan - 05/01/16 1553    Clinical Impression Statement Patient is a 66 yo pleasant right hand dominant male presenting with balance difficulties, difficulties walking, and increased L ankle pain. Patient demonstrates higher fall risk and decreased static/dynamic balance as indicated by his BERG and DGI. Patient demonstrates antalgic gait pattern with decreased weight shifting onto the L LE and decreased step length on R LE. Patient will benefit from further skilled therapy focused on improving current limitations to return to prior level of function.    Rehab Potential Fair   Clinical Impairments Affecting Rehab Potential (+) highly motivated, family support (-) chronicity of impairment, previous TBI   PT Frequency 2x / week   PT Duration 6 weeks   PT Treatment/Interventions Manual techniques;Neuromuscular re-education;Gait training;Stair training;Therapeutic activities;Therapeutic exercise;Balance training;Patient/family education;Functional mobility training;Moist Heat;Ultrasound;Electrical Stimulation   PT Next Visit Plan Progress strengthening and balancing techniques   PT Home Exercise Plan Single leg stance, Tandem stance   Consulted and Agree with Plan of Care Patient      Patient will  benefit from skilled therapeutic intervention in order to improve the following deficits and impairments:  Abnormal gait, Decreased balance, Pain, Impaired sensation, Decreased mobility, Increased muscle spasms, Decreased endurance, Decreased strength, Decreased range of motion, Difficulty walking, Decreased activity tolerance  Visit Diagnosis: Difficulty in walking, not elsewhere classified  Unsteadiness on feet  Other lack  of coordination      G-Codes - 2016/05/22 1623    Functional Assessment Tool Used BERG, DGI, clinical judgement   Functional Limitation Mobility: Walking and moving around   Mobility: Walking and Moving Around Current Status [phone]) At least 1 percent but less than 20 percent impaired, limited or restricted   Mobility: Walking and Moving Around Goal Status [phone]) At least 1 percent but less than 20 percent impaired, limited or restricted       Problem List There are no active problems to display for this patient.   Myrene Galas, PT DPT 05-22-16, 4:25 PM  [facility] MAIN [facility] SERVICES [address] [place], [place], [address] Phone: [phone]   Fax:  [phone]  Name: CHRISTY ARQUILLA MRN: [id number] Date of Birth: [birth year]

## 2016-05-05 ENCOUNTER — Ambulatory Visit: Payer: Medicare Other

## 2016-05-05 DIAGNOSIS — R262 Difficulty in walking, not elsewhere classified: Secondary | ICD-10-CM

## 2016-05-05 DIAGNOSIS — R2681 Unsteadiness on feet: Secondary | ICD-10-CM

## 2016-05-05 DIAGNOSIS — R278 Other lack of coordination: Secondary | ICD-10-CM

## 2016-05-06 NOTE — Therapy (Signed)
[place] [facility] MAIN [facility] SERVICES [address] [place], [place], [address] Phone: [phone]   Fax:  [phone]  Physical Therapy Treatment  Patient Details  Name: Blake [name] MRN: [id number] Date of Birth: 04-Dec-1950 Referring Provider: Dorothey Baseman MD  Encounter Date: 05/05/2016      PT End of Session - 05/06/16 0933    Visit Number 2   Number of Visits 12   Date for PT Re-Evaluation 06/12/16   Authorization Type 2 /10 G code   PT Start Time 1655   PT Stop Time 1735   PT Time Calculation (min) 40 min   Equipment Utilized During Treatment Gait belt   Activity Tolerance Patient tolerated treatment well   Behavior During Therapy [facility] for tasks assessed/performed      Past Medical History:  Diagnosis Date  . Asperger's syndrome    (per wife)  . Coronary artery disease   . Dementia   . Depression   . GERD (gastroesophageal reflux disease)   . Headache    stress  . Hearing loss   . Hyperlipidemia   . Hypertension   . 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 Myocardial infarction 08/2013  . OCD (obsessive compulsive disorder)   . Prostatic hypertrophy   . Raynaud's disease   . Repeated falls    weak left ankle  . Scoliosis   . Stroke (HCC) 2/16   "light"  . Synovitis    knees, ankles    Past Surgical History:  Procedure Laterality Date  . BACK SURGERY  2011   rods and screws  . COLONOSCOPY  2015  . CYSTOSCOPY  1984  . EYE SURGERY Bilateral 2005,2006,2009   detached retina, cataract  . FOOT ARTHRODESIS Left 05/30/2015   Procedure: ARTHRODESIS FOOT STJ LT FOOT;  Surgeon: [name], DPM;  Location: [facility];  Service: Podiatry;  Laterality: Left;  WITH POPLITEAL BLOCK  . FOOT SURGERY Left   . HERNIA REPAIR Right 1969   inguinal  . SHOULDER SURGERY Right 2004  . skull surgery    . TOE SURGERY Right 2009  .  TONSILLECTOMY  2008    There were no vitals filed for this visit.      Subjective Assessment - 05/05/16 1858    Subjective Patient reports the same difficulties as mentioned the previous visit. States he continues to have ankle pain at rest.   Pertinent History Hx of TBI (2011); L ankle fx 1 year prior   Limitations Standing;Walking   How long can you walk comfortably? 2 miles    Patient Stated Goals Reduction of pain when walking.    Currently in Pain? Yes   Pain Score 2    Pain Location Ankle   Pain Orientation Left   Pain Descriptors / Indicators Aching;Constant   Pain Type Chronic pain   Pain Onset More than a month ago     Observation: DF: 0deg (Improved to 5 deg after manual therapy), PF: 45 deg, Inversion: 15deg, eversion: 15deg  TREATMENT: Manual Therapy: Mobilizations - Ant/post Talocrual for improving DF, subtalar for improved inversion, eversion,  talocrual distraction - 3 x 30 sec each movement  Therapeutic Exercise: Single leg stance -- 3 x 20 sec performed B Tandem stance -- 3 x 20sec performed B AROM dorsiflexion, plantarflexion, inversion, eversion - 10 with 5sec resisted holds [employer]  wipers with feet flat on the towel - 2 x 2 min Towel scrunches with feet flat on the towel - 2 x      PT Education - 05/05/16 1904    Education provided Yes   Education Details HEP: windshield wipers, towel scrunches   Person(s) Educated Patient   Methods Explanation;Demonstration;Handout   Comprehension Returned demonstration;Verbalized understanding             PT Long Term Goals - 05/01/16 1602      PT LONG TERM GOAL #1   Title Patient will improve BERG to 56/56 to demonstrate significant improvement in static balance and improved ability to donn pants in standing   Baseline BERG 50/56   Time 6   Period Weeks   Status New     PT LONG TERM GOAL #2   Title Patient will improve DGI to 24/24 to demonstrate significant improvement in dynamic balance  and ability to walk without falling   Baseline DGI: 19/24   Time 6   Period Weeks   Status New     PT LONG TERM GOAL #3   Title Patient will improve L ankle pain to the worse pain being 2/10 on the VAS to demonstrate significant improvement in pain.    Baseline VAS: 7/10   Time 6   Period Weeks   Status New     PT LONG TERM GOAL #4   Title Patient will be independent with HEP to continues benefits of therapy after discharge.    Baseline dependent with exercise progression and performacne   Time 6   Period Weeks   Status New               Plan - 05/05/16 0951    Clinical Impression Statement Focused on improving L foot/ankle pain secondary to pain contributing to antalgic gait and performed shorter session today secondary to patient being late for scheduled appointment. Patient demonstrates decreased movement and pain in all ankle movement indicating decreased  muscular coordination, ROM, and dysfunction. Patient demonstrates improved mobility after performing manual therapy but demonstrates increased pain in standing indicating decreased ankle stablization. Patient will benefit from further skilled therapy to improve balance and return to prior level of function.    Rehab Potential Fair   Clinical Impairments Affecting Rehab Potential (+) highly motivated, family support (-) chronicity of impairment, previous TBI   PT Frequency 2x / week   PT Duration 6 weeks   PT Treatment/Interventions Manual techniques;Neuromuscular re-education;Gait training;Stair training;Therapeutic activities;Therapeutic exercise;Balance training;Patient/family education;Functional mobility training;Moist Heat;Ultrasound;Electrical Stimulation   PT Next Visit Plan Progress strengthening and balancing techniques   PT Home Exercise Plan Single leg stance, Tandem stance   Consulted and Agree with Plan of Care Patient      Patient will benefit from skilled therapeutic intervention in order to  improve the following deficits and impairments:  Abnormal gait, Decreased balance, Pain, Impaired sensation, Decreased mobility, Increased muscle spasms, Decreased endurance, Decreased strength, Decreased range of motion, Difficulty walking, Decreased activity tolerance  Visit Diagnosis: Difficulty in walking, not elsewhere classified  Unsteadiness on feet  Other lack of coordination     Problem List There are no active problems to display for this patient.   Myrene Galas, PT DPT 05/06/2016, 10:18 AM  [place] [facility] MAIN [facility] SERVICES [address] [place], [place], [address] Phone: [phone]   Fax:  [phone]  Name: Blake [name] MRN: [id number] Date of Birth: 06/26/1950

## 2016-05-08 ENCOUNTER — Ambulatory Visit: Payer: Medicare Other

## 2016-05-13 ENCOUNTER — Ambulatory Visit: Payer: Medicare Other

## 2016-05-13 DIAGNOSIS — R2681 Unsteadiness on feet: Secondary | ICD-10-CM

## 2016-05-13 DIAGNOSIS — R262 Difficulty in walking, not elsewhere classified: Secondary | ICD-10-CM

## 2016-05-13 DIAGNOSIS — R278 Other lack of coordination: Secondary | ICD-10-CM

## 2016-05-13 NOTE — Therapy (Signed)
[place] [facility] MAIN [facility] SERVICES [address] [place], [place], [address] Phone: [phone]   Fax:  [phone]  Physical Therapy Treatment  Patient Details  Name: Blake [name] MRN: [id number] Date of Birth: 07-Feb-1951 Referring Provider: Dorothey Baseman MD  Encounter Date: 05/13/2016      PT End of Session - 05/13/16 1455    Visit Number 3   Number of Visits 12   Date for PT Re-Evaluation 06/12/16   Authorization Type 3 /10 G code   PT Start Time 1359   PT Stop Time 1429   PT Time Calculation (min) 30 min   Equipment Utilized During Treatment Gait belt   Activity Tolerance Patient tolerated treatment well   Behavior During Therapy [facility] for tasks assessed/performed      Past Medical History:  Diagnosis Date  . Asperger's syndrome    (per wife)  . Coronary artery disease   . Dementia   . Depression   . GERD (gastroesophageal reflux disease)   . Headache    stress  . Hearing loss   . Hyperlipidemia   . Hypertension   . 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 Myocardial infarction 08/2013  . OCD (obsessive compulsive disorder)   . Prostatic hypertrophy   . Raynaud's disease   . Repeated falls    weak left ankle  . Scoliosis   . Stroke (HCC) 2/16   "light"  . Synovitis    knees, ankles    Past Surgical History:  Procedure Laterality Date  . BACK SURGERY  2011   rods and screws  . COLONOSCOPY  2015  . CYSTOSCOPY  1984  . EYE SURGERY Bilateral 2005,2006,2009   detached retina, cataract  . FOOT ARTHRODESIS Left 05/30/2015   Procedure: ARTHRODESIS FOOT STJ LT FOOT;  Surgeon: [name], DPM;  Location: [facility];  Service: Podiatry;  Laterality: Left;  WITH POPLITEAL BLOCK  . FOOT SURGERY Left   . HERNIA REPAIR Right 1969   inguinal  . SHOULDER SURGERY Right 2004  . skull surgery    . TOE SURGERY Right 2009  .  TONSILLECTOMY  2008    There were no vitals filed for this visit.      Subjective Assessment - 05/13/16 1450    Subjective Patient reports his balance is becoming better when walking and the exercises are becoming easier to perform.    Pertinent History Hx of TBI (2011); L ankle fx 1 year prior   Limitations Standing;Walking   How long can you walk comfortably? 2 miles    Patient Stated Goals Reduction of pain when walking.    Currently in Pain? Yes   Pain Score 2    Pain Location Ankle   Pain Orientation Left   Pain Descriptors / Indicators Aching   Pain Onset More than a month ago        TREATMENT: Manual therapy: STM with patient positioned in sitting with focus on addressing increased pain and spasms along 4th-5th metatarsal and plantar fascia. Mobilizations along 3-4-5th digits grade III to improve joint movement and decrease pain.  Therapeutic exercise: Ankle dorsiflexion/plantar with 2 seconds holds performing for 2 min with patient in sitting Ankle inversion/eversion with 2 second holds performing for 2 min with patient in sitting  Windshield wipers with patient positioned in standing and foot flat on towel -  performed for 2 min Towel scrunches with patient positioned in standing - performed for 2 min Seated with GTB ankle dorsiflexion/plantarflexion/inversion/eversion - 2 min for each direction Educated on technique/form with exercise technique throughout session, added exercises to HEP        PT Education - 05/13/16 1453    Education provided Yes   Education Details HEP: resisted ankle band exercises GTB, ankle pumps    Person(s) Educated Patient   Methods Explanation;Demonstration;Handout   Comprehension Verbalized understanding;Returned demonstration             PT Long Term Goals - 05/01/16 1602      PT LONG TERM GOAL #1   Title Patient will improve BERG to 56/56 to demonstrate significant improvement in static balance and improved ability to donn  pants in standing   Baseline BERG 50/56   Time 6   Period Weeks   Status New     PT LONG TERM GOAL #2   Title Patient will improve DGI to 24/24 to demonstrate significant improvement in dynamic balance and ability to walk without falling   Baseline DGI: 19/24   Time 6   Period Weeks   Status New     PT LONG TERM GOAL #3   Title Patient will improve L ankle pain to the worse pain being 2/10 on the VAS to demonstrate significant improvement in pain.    Baseline VAS: 7/10   Time 6   Period Weeks   Status New     PT LONG TERM GOAL #4   Title Patient will be independent with HEP to continues benefits of therapy after discharge.    Baseline dependent with exercise progression and performacne   Time 6   Period Weeks   Status New               Plan - 05/13/16 1456    Clinical Impression Statement Patient demonstrated improved pain with ankle movements today indicating functional carryover between visits and improved motor control. Patient required frequent cueing for motor control for seperating ankle and hip movements indicating decreased coordination and patient will benefit from further skilled therapy to return to PLOF.    Rehab Potential Fair   Clinical Impairments Affecting Rehab Potential (+) highly motivated, family support (-) chronicity of impairment, previous TBI   PT Frequency 2x / week   PT Duration 6 weeks   PT Treatment/Interventions Manual techniques;Neuromuscular re-education;Gait training;Stair training;Therapeutic activities;Therapeutic exercise;Balance training;Patient/family education;Functional mobility training;Moist Heat;Ultrasound;Electrical Stimulation   PT Next Visit Plan Progress strengthening and balancing techniques   PT Home Exercise Plan Single leg stance, Tandem stance   Consulted and Agree with Plan of Care Patient      Patient will benefit from skilled therapeutic intervention in order to improve the following deficits and impairments:   Abnormal gait, Decreased balance, Pain, Impaired sensation, Decreased mobility, Increased muscle spasms, Decreased endurance, Decreased strength, Decreased range of motion, Difficulty walking, Decreased activity tolerance  Visit Diagnosis: Difficulty in walking, not elsewhere classified  Unsteadiness on feet  Other lack of coordination     Problem List There are no active problems to display for this patient.   Myrene Galas, PT DPT 05/13/2016, 3:07 PM  [place] [facility] MAIN [facility] SERVICES [address] [place], [place], [address] Phone: [phone]   Fax:  [phone]  Name: RENWICK PIERE MRN: [id number] Date of Birth: Nov 11, 1950

## 2016-05-14 ENCOUNTER — Ambulatory Visit: Payer: Medicare Other

## 2016-05-14 DIAGNOSIS — R278 Other lack of coordination: Secondary | ICD-10-CM

## 2016-05-14 DIAGNOSIS — R262 Difficulty in walking, not elsewhere classified: Secondary | ICD-10-CM | POA: Diagnosis not present

## 2016-05-14 DIAGNOSIS — R2681 Unsteadiness on feet: Secondary | ICD-10-CM

## 2016-05-14 NOTE — Therapy (Signed)
[place] [facility] MAIN [facility] SERVICES [address] [place], [place], [address] Phone: [phone]   Fax:  [phone]  Physical Therapy Treatment  Patient Details  Name: Blake [name] MRN: [id number] Date of Birth: May 08, 1950 Referring Provider: Dorothey Baseman MD  Encounter Date: 05/14/2016      PT End of Session - 05/14/16 1529    Visit Number 4   Number of Visits 12   Date for PT Re-Evaluation 06/12/16   Authorization Type 4 /10 G code   PT Start Time 1303   PT Stop Time 1345   PT Time Calculation (min) 42 min   Equipment Utilized During Treatment Gait belt   Activity Tolerance Patient tolerated treatment well   Behavior During Therapy [facility] for tasks assessed/performed      Past Medical History:  Diagnosis Date  . Asperger's syndrome    (per wife)  . Coronary artery disease   . Dementia   . Depression   . GERD (gastroesophageal reflux disease)   . Headache    stress  . Hearing loss   . Hyperlipidemia   . Hypertension   . 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 Myocardial infarction 08/2013  . OCD (obsessive compulsive disorder)   . Prostatic hypertrophy   . Raynaud's disease   . Repeated falls    weak left ankle  . Scoliosis   . Stroke (HCC) 2/16   "light"  . Synovitis    knees, ankles    Past Surgical History:  Procedure Laterality Date  . BACK SURGERY  2011   rods and screws  . COLONOSCOPY  2015  . CYSTOSCOPY  1984  . EYE SURGERY Bilateral 2005,2006,2009   detached retina, cataract  . FOOT ARTHRODESIS Left 05/30/2015   Procedure: ARTHRODESIS FOOT STJ LT FOOT;  Surgeon: [name], DPM;  Location: [facility];  Service: Podiatry;  Laterality: Left;  WITH POPLITEAL BLOCK  . FOOT SURGERY Left   . HERNIA REPAIR Right 1969   inguinal  . SHOULDER SURGERY Right 2004  . skull surgery    . TOE SURGERY Right 2009  .  TONSILLECTOMY  2008    There were no vitals filed for this visit.      Subjective Assessment - 05/14/16 1335    Subjective Patient reports decreased ankle pain when waking up today.    Pertinent History Hx of TBI (2011); L ankle fx 1 year prior   Limitations Standing;Walking   How long can you walk comfortably? 2 miles    Patient Stated Goals Reduction of pain when walking.    Currently in Pain? Yes   Pain Score 2    Pain Location Ankle   Pain Orientation Left   Pain Descriptors / Indicators Aching   Pain Onset More than a month ago   Pain Frequency Constant      TREATMENT: Manual therapy: STM with patient positioned in sitting with focus on addressing increased pain and spasms along 4th-5th metatarsal and plantar fascia. Mobilizations along 3-4-5th digits grade III to improve joint movement and decrease pain.  Therapeutic exercise: Ankle dorsiflexion/plantar with 2 seconds holds performing for 2 min with patient in sitting Ankle inversion/eversion with 2 second holds performing for 2 min with patient in sitting  Seated with GTB ankle dorsiflexion/plantarflexion/inversion/eversion - 2 min for each direction Tandem Stance with intermittent UE support -  3 x 1 min Hip abduction in standing - x20 B Heel/toe raises in standing - x 20 with B LE Educated on technique/form with exercise technique throughout session, added exercises to HEP       PT Education - 05/14/16 1529    Education provided Yes   Education Details Form/technique throughout exercise session   Person(s) Educated Patient   Methods Explanation;Demonstration   Comprehension Verbalized understanding;Returned demonstration             PT Long Term Goals - 05/01/16 1602      PT LONG TERM GOAL #1   Title Patient will improve BERG to 56/56 to demonstrate significant improvement in static balance and improved ability to donn pants in standing   Baseline BERG 50/56   Time 6   Period Weeks   Status New      PT LONG TERM GOAL #2   Title Patient will improve DGI to 24/24 to demonstrate significant improvement in dynamic balance and ability to walk without falling   Baseline DGI: 19/24   Time 6   Period Weeks   Status New     PT LONG TERM GOAL #3   Title Patient will improve L ankle pain to the worse pain being 2/10 on the VAS to demonstrate significant improvement in pain.    Baseline VAS: 7/10   Time 6   Period Weeks   Status New     PT LONG TERM GOAL #4   Title Patient will be independent with HEP to continues benefits of therapy after discharge.    Baseline dependent with exercise progression and performacne   Time 6   Period Weeks   Status New               Plan - 05/14/16 1531    Clinical Impression Statement Patient demonstrated improved pain with exercises and non weight bearing ankle movements indicating functional carryover between sessions. Although patient is improving, he continues to have increased ankle/foot pain leading to antalgic gait and will benefit from furhter skilled therapy to return to prior level of function.   Rehab Potential Fair   Clinical Impairments Affecting Rehab Potential (+) highly motivated, family support (-) chronicity of impairment, previous TBI   PT Frequency 2x / week   PT Duration 6 weeks   PT Treatment/Interventions Manual techniques;Neuromuscular re-education;Gait training;Stair training;Therapeutic activities;Therapeutic exercise;Balance training;Patient/family education;Functional mobility training;Moist Heat;Ultrasound;Electrical Stimulation   PT Next Visit Plan Progress strengthening and balancing techniques   PT Home Exercise Plan Single leg stance, Tandem stance   Consulted and Agree with Plan of Care Patient      Patient will benefit from skilled therapeutic intervention in order to improve the following deficits and impairments:  Abnormal gait, Decreased balance, Pain, Impaired sensation, Decreased mobility, Increased muscle  spasms, Decreased endurance, Decreased strength, Decreased range of motion, Difficulty walking, Decreased activity tolerance  Visit Diagnosis: Difficulty in walking, not elsewhere classified  Unsteadiness on feet  Other lack of coordination     Problem List There are no active problems to display for this patient.   Myrene Galas, PT DPT 05/14/2016, 3:55 PM  [place] [facility] MAIN [facility] SERVICES [address] [place], [place], [address] Phone: [phone]   Fax:  [phone]  Name: NATHANEIL FEAGANS MRN: [id number] Date of Birth: 09/17/1950

## 2016-05-20 ENCOUNTER — Ambulatory Visit: Payer: Medicare Other

## 2016-05-20 DIAGNOSIS — R262 Difficulty in walking, not elsewhere classified: Secondary | ICD-10-CM

## 2016-05-20 DIAGNOSIS — R2681 Unsteadiness on feet: Secondary | ICD-10-CM

## 2016-05-20 DIAGNOSIS — R278 Other lack of coordination: Secondary | ICD-10-CM

## 2016-05-20 NOTE — Therapy (Signed)
[facility] MAIN [facility] SERVICES [address] [place], [place], [address] Phone: [phone]   Fax:  [phone]  Physical Therapy Treatment  Patient Details  Name: Blake [name] MRN: [id number] Date of Birth: 1951-03-12 Referring Provider: Dorothey Baseman MD  Encounter Date: 05/20/2016      PT End of Session - 05/20/16 1447    Visit Number 5   Number of Visits 12   Date for PT Re-Evaluation 06/12/16   Authorization Type 5 /10 G code   PT Start Time 1300   PT Stop Time 1345   PT Time Calculation (min) 45 min   Equipment Utilized During Treatment Gait belt   Activity Tolerance Patient tolerated treatment well   Behavior During Therapy [facility] for tasks assessed/performed      Past Medical History:  Diagnosis Date  . Asperger's syndrome    (per wife)  . Coronary artery disease   . Dementia   . Depression   . GERD (gastroesophageal reflux disease)   . Headache    stress  . Hearing loss   . Hyperlipidemia   . Hypertension   . 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 Myocardial infarction 08/2013  . OCD (obsessive compulsive disorder)   . Prostatic hypertrophy   . Raynaud's disease   . Repeated falls    weak left ankle  . Scoliosis   . Stroke (HCC) 2/16   "light"  . Synovitis    knees, ankles    Past Surgical History:  Procedure Laterality Date  . BACK SURGERY  2011   rods and screws  . COLONOSCOPY  2015  . CYSTOSCOPY  1984  . EYE SURGERY Bilateral 2005,2006,2009   detached retina, cataract  . FOOT ARTHRODESIS Left 05/30/2015   Procedure: ARTHRODESIS FOOT STJ LT FOOT;  Surgeon: [name], DPM;  Location: [facility];  Service: Podiatry;  Laterality: Left;  WITH POPLITEAL BLOCK  . FOOT SURGERY Left   . HERNIA REPAIR Right 1969   inguinal  . SHOULDER SURGERY Right 2004  . skull surgery    . TOE SURGERY Right 2009  .  TONSILLECTOMY  2008    There were no vitals filed for this visit.      Subjective Assessment - 05/20/16 1445    Subjective Patient reports he continues to have decreased ankle pain and reports he has increased ease with walking   Pertinent History Hx of TBI (2011); L ankle fx 1 year prior   Limitations Standing;Walking   How long can you walk comfortably? 2 miles    Patient Stated Goals Reduction of pain when walking.    Currently in Pain? Yes   Pain Score 1    Pain Location Ankle   Pain Orientation Left   Pain Descriptors / Indicators Aching   Pain Type Chronic pain   Pain Onset More than a month ago      TREATMENT: Manual therapy: STM with patient positioned in sitting with focus on addressing increased pain and spasms along 4th-5th metatarsal and plantar fascia. Mobilizations along 3-4-5th digits grade III to improve joint movement and decrease pain.   Therapeutic exercise: Ankle dorsiflexion/plantar with 2 seconds holds performing for 1 min with patient in sitting Ankle inversion/eversion with 2 second holds performing for 1 min with patient in sitting  Seated with GTB ankle dorsiflexion/plantarflexion/inversion/eversion - 2 min  for each direction Wobbleboard left/right with UE support - 2 min BAPS board level 2 with UE support - left/right; forward/backward 2 min Ambulation with focus on taking larger steps ( on L LE versus R) - 6 x 53ft  Side stepping with GTB - 48ft x 4  Heel/toe raises in standing on airex - x 20 with B LE Educated on technique/form with exercise technique throughout session, added exercises to HEP       PT Education - 05/20/16 1446    Education provided Yes   Education Details Maintaining Exercise home program; Form/technique   Person(s) Educated Patient   Methods Explanation;Demonstration   Comprehension Verbalized understanding;Returned demonstration             PT Long Term Goals - 05/01/16 1602      PT LONG TERM GOAL #1   Title  Patient will improve BERG to 56/56 to demonstrate significant improvement in static balance and improved ability to donn pants in standing   Baseline BERG 50/56   Time 6   Period Weeks   Status New     PT LONG TERM GOAL #2   Title Patient will improve DGI to 24/24 to demonstrate significant improvement in dynamic balance and ability to walk without falling   Baseline DGI: 19/24   Time 6   Period Weeks   Status New     PT LONG TERM GOAL #3   Title Patient will improve L ankle pain to the worse pain being 2/10 on the VAS to demonstrate significant improvement in pain.    Baseline VAS: 7/10   Time 6   Period Weeks   Status New     PT LONG TERM GOAL #4   Title Patient will be independent with HEP to continues benefits of therapy after discharge.    Baseline dependent with exercise progression and performacne   Time 6   Period Weeks   Status New               Plan - 05/20/16 1448    Clinical Impression Statement Patient continues to demonstrate improved ambulation with gait speed and improved step length. Patient also demonstrates improved pain with movement, but conitnues to demonstrate increased postural sway and pain with ankle movements; patient would benefit from further skilled therapy to return to prior level of function.    Rehab Potential Fair   Clinical Impairments Affecting Rehab Potential (+) highly motivated, family support (-) chronicity of impairment, previous TBI   PT Frequency 2x / week   PT Duration 6 weeks   PT Treatment/Interventions Manual techniques;Neuromuscular re-education;Gait training;Stair training;Therapeutic activities;Therapeutic exercise;Balance training;Patient/family education;Functional mobility training;Moist Heat;Ultrasound;Electrical Stimulation   PT Next Visit Plan Progress strengthening and balancing techniques   PT Home Exercise Plan Single leg stance, Tandem stance   Consulted and Agree with Plan of Care Patient      Patient will  benefit from skilled therapeutic intervention in order to improve the following deficits and impairments:  Abnormal gait, Decreased balance, Pain, Impaired sensation, Decreased mobility, Increased muscle spasms, Decreased endurance, Decreased strength, Decreased range of motion, Difficulty walking, Decreased activity tolerance  Visit Diagnosis: Difficulty in walking, not elsewhere classified  Unsteadiness on feet  Other lack of coordination     Problem List There are no active problems to display for this patient.   Myrene Galas, PT DPT 05/20/2016, 2:52 PM  [facility] MAIN [facility] SERVICES [address] [place], [place], [address] Phone: [phone]  Fax:  [phone]  Name: CREWS BUCZEK MRN: [id number] Date of Birth: [birth year]

## 2016-05-22 ENCOUNTER — Ambulatory Visit: Payer: Medicare Other | Attending: Family Medicine

## 2016-05-22 DIAGNOSIS — R278 Other lack of coordination: Secondary | ICD-10-CM

## 2016-05-22 DIAGNOSIS — R262 Difficulty in walking, not elsewhere classified: Secondary | ICD-10-CM | POA: Insufficient documentation

## 2016-05-22 DIAGNOSIS — R2681 Unsteadiness on feet: Secondary | ICD-10-CM | POA: Insufficient documentation

## 2016-05-22 NOTE — Therapy (Signed)
[facility] MAIN [facility] SERVICES [address] [place], [place], [address] Phone: [phone]   Fax:  [phone]  Physical Therapy Treatment  Patient Details  Name: Blake [name] MRN: [id number] Date of Birth: 1950-06-21 Referring Provider: Dorothey Baseman MD  Encounter Date: 05/22/2016      PT End of Session - 05/22/16 1438    Visit Number 6   Number of Visits 12   Date for PT Re-Evaluation 06/12/16   Authorization Type 6 /10 G code   PT Start Time 1355   PT Stop Time 1430   PT Time Calculation (min) 35 min   Equipment Utilized During Treatment Gait belt   Activity Tolerance Patient tolerated treatment well   Behavior During Therapy [facility] for tasks assessed/performed      Past Medical History:  Diagnosis Date  . Asperger's syndrome    (per wife)  . Coronary artery disease   . Dementia   . Depression   . GERD (gastroesophageal reflux disease)   . Headache    stress  . Hearing loss   . Hyperlipidemia   . Hypertension   . 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 Myocardial infarction 08/2013  . OCD (obsessive compulsive disorder)   . Prostatic hypertrophy   . Raynaud's disease   . Repeated falls    weak left ankle  . Scoliosis   . Stroke (HCC) 2/16   "light"  . Synovitis    knees, ankles    Past Surgical History:  Procedure Laterality Date  . BACK SURGERY  2011   rods and screws  . COLONOSCOPY  2015  . CYSTOSCOPY  1984  . EYE SURGERY Bilateral 2005,2006,2009   detached retina, cataract  . FOOT ARTHRODESIS Left 05/30/2015   Procedure: ARTHRODESIS FOOT STJ LT FOOT;  Surgeon: [name], DPM;  Location: [facility];  Service: Podiatry;  Laterality: Left;  WITH POPLITEAL BLOCK  . FOOT SURGERY Left   . HERNIA REPAIR Right 1969   inguinal  . SHOULDER SURGERY Right 2004  . skull surgery    . TOE SURGERY Right 2009  .  TONSILLECTOMY  2008    There were no vitals filed for this visit.      Subjective Assessment - 05/22/16 1437    Subjective Patient reports decreased pain and states he's doing better.   Pertinent History Hx of TBI (2011); L ankle fx 1 year prior   Limitations Standing;Walking   How long can you walk comfortably? 2 miles    Patient Stated Goals Reduction of pain when walking.    Currently in Pain? Yes   Pain Score 1    Pain Location Ankle   Pain Orientation Left   Pain Descriptors / Indicators Aching   Pain Type Chronic pain   Pain Onset More than a month ago   Pain Frequency Constant        TREATMENT: Manual therapy: STM with patient positioned in sitting with focus on addressing increased pain and spasms along 4th-5th metatarsal and plantar fascia. Mobilizations along 3-4-5th digits grade III to improve joint movement and decrease pain.    Therapeutic exercise: BAPS board level 2 with UE support - left/right; forward/backward 2 min Side stepping with the Bosu - x15 up and over Single leg stance on Bosu - 1 min x 3 B Feet together balance on  bosu (black side) - x3 B  Step ups onto Bosu ball - x 20  Educated on technique/form with exercise technique throughout session, added exercises to HEP        PT Education - 05/22/16 1438    Education provided Yes   Education Details Form/technique with exercise   Person(s) Educated Patient   Methods Explanation;Demonstration   Comprehension Verbalized understanding;Returned demonstration             PT Long Term Goals - 05/01/16 1602      PT LONG TERM GOAL #1   Title Patient will improve BERG to 56/56 to demonstrate significant improvement in static balance and improved ability to donn pants in standing   Baseline BERG 50/56   Time 6   Period Weeks   Status New     PT LONG TERM GOAL #2   Title Patient will improve DGI to 24/24 to demonstrate significant improvement in dynamic balance and ability to walk  without falling   Baseline DGI: 19/24   Time 6   Period Weeks   Status New     PT LONG TERM GOAL #3   Title Patient will improve L ankle pain to the worse pain being 2/10 on the VAS to demonstrate significant improvement in pain.    Baseline VAS: 7/10   Time 6   Period Weeks   Status New     PT LONG TERM GOAL #4   Title Patient will be independent with HEP to continues benefits of therapy after discharge.    Baseline dependent with exercise progression and performacne   Time 6   Period Weeks   Status New               Plan - 05/22/16 1440    Clinical Impression Statement Patient continues to improve demonstrating decreased pain with exercises indicating functional carryover between visits. Although patient is improving he continues to demonstrate  antalgic gait and pain with higher level activities and patient will benefit from further skilled therapy to return to prior level of function.    Rehab Potential Fair   Clinical Impairments Affecting Rehab Potential (+) highly motivated, family support (-) chronicity of impairment, previous TBI   PT Frequency 2x / week   PT Duration 6 weeks   PT Treatment/Interventions Manual techniques;Neuromuscular re-education;Gait training;Stair training;Therapeutic activities;Therapeutic exercise;Balance training;Patient/family education;Functional mobility training;Moist Heat;Ultrasound;Electrical Stimulation   PT Next Visit Plan Progress strengthening and balancing techniques   PT Home Exercise Plan Single leg stance, Tandem stance   Consulted and Agree with Plan of Care Patient      Patient will benefit from skilled therapeutic intervention in order to improve the following deficits and impairments:  Abnormal gait, Decreased balance, Pain, Impaired sensation, Decreased mobility, Increased muscle spasms, Decreased endurance, Decreased strength, Decreased range of motion, Difficulty walking, Decreased activity tolerance  Visit  Diagnosis: Difficulty in walking, not elsewhere classified  Unsteadiness on feet  Other lack of coordination     Problem List There are no active problems to display for this patient.   Myrene Galas, PT DPT 05/22/2016, 2:44 PM  [facility] MAIN [facility] SERVICES [address] [place], [place], [address] Phone: [phone]   Fax:  [phone]  Name: Blake [name] MRN: [id number] Date of Birth: July 31, 1950

## 2016-05-27 ENCOUNTER — Ambulatory Visit: Payer: Medicare Other

## 2016-05-27 DIAGNOSIS — R278 Other lack of coordination: Secondary | ICD-10-CM

## 2016-05-27 DIAGNOSIS — R2681 Unsteadiness on feet: Secondary | ICD-10-CM

## 2016-05-27 DIAGNOSIS — R262 Difficulty in walking, not elsewhere classified: Secondary | ICD-10-CM

## 2016-05-27 NOTE — Therapy (Signed)
[facility] MAIN [facility] SERVICES [address] [place], [place], [address] Phone: [phone]   Fax:  [phone]  Physical Therapy Treatment  Patient Details  Name: Blake [name] MRN: [id number] Date of Birth: 12-Oct-1950 Referring Provider: Dorothey Baseman MD  Encounter Date: 05/27/2016      PT End of Session - 05/27/16 1625    Visit Number 7   Number of Visits 12   Date for PT Re-Evaluation 06/12/16   Authorization Type 7 /10 G code   PT Start Time 1515   PT Stop Time 1600   PT Time Calculation (min) 45 min   Equipment Utilized During Treatment Gait belt   Activity Tolerance Patient tolerated treatment well   Behavior During Therapy [facility] for tasks assessed/performed      Past Medical History:  Diagnosis Date  . Asperger's syndrome    (per wife)  . Coronary artery disease   . Dementia   . Depression   . GERD (gastroesophageal reflux disease)   . Headache    stress  . Hearing loss   . Hyperlipidemia   . Hypertension   . 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 Myocardial infarction 08/2013  . OCD (obsessive compulsive disorder)   . Prostatic hypertrophy   . Raynaud's disease   . Repeated falls    weak left ankle  . Scoliosis   . Stroke (HCC) 2/16   "light"  . Synovitis    knees, ankles    Past Surgical History:  Procedure Laterality Date  . BACK SURGERY  2011   rods and screws  . COLONOSCOPY  2015  . CYSTOSCOPY  1984  . EYE SURGERY Bilateral 2005,2006,2009   detached retina, cataract  . FOOT ARTHRODESIS Left 05/30/2015   Procedure: ARTHRODESIS FOOT STJ LT FOOT;  Surgeon: [name], DPM;  Location: [facility];  Service: Podiatry;  Laterality: Left;  WITH POPLITEAL BLOCK  . FOOT SURGERY Left   . HERNIA REPAIR Right 1969   inguinal  . SHOULDER SURGERY Right 2004  . skull surgery    . TOE SURGERY Right 2009  .  TONSILLECTOMY  2008    There were no vitals filed for this visit.      Subjective Assessment - 05/27/16 1554    Subjective Patients reports decreased pain when walking and is having less pain in the morning.    Pertinent History Hx of TBI (2011); L ankle fx 1 year prior   Limitations Standing;Walking   How long can you walk comfortably? 2 miles    Patient Stated Goals Reduction of pain when walking.    Currently in Pain? Yes   Pain Score 1    Pain Location Ankle   Pain Orientation Left   Pain Descriptors / Indicators Aching   Pain Type Chronic pain   Pain Onset More than a month ago      TREATMENT: Manual therapy: STM with patient positioned in sitting with focus on addressing increased pain and spasms along 4th-5th metatarsal and plantar fascia. Mobilizations along 3-4-5th digits grade III to improve joint movement and decrease pain.    Therapeutic exercise: BAPS board level 2 with UE support - left/right; forward/backward 2 min Side stepping with the Bosu - x15 up and over Black side bosu balancing feet together - weight shifts left/right; forward/backward 2 min Feet together balance  on bosu (black side) - x3 B         Heel/toe raises on bosu - x20 Hip abduction with balance stone - x20    Educated on technique/form with exercise technique throughout session, added exercises to HEP       PT Education - 05/27/16 1624    Education provided Yes   Education Details Educated on form/technique; instructed on HEP   Person(s) Educated Patient   Methods Explanation;Demonstration   Comprehension Verbalized understanding;Returned demonstration             PT Long Term Goals - 05/01/16 1602      PT LONG TERM GOAL #1   Title Patient will improve BERG to 56/56 to demonstrate significant improvement in static balance and improved ability to donn pants in standing   Baseline BERG 50/56   Time 6   Period Weeks   Status New     PT LONG TERM GOAL #2   Title Patient  will improve DGI to 24/24 to demonstrate significant improvement in dynamic balance and ability to walk without falling   Baseline DGI: 19/24   Time 6   Period Weeks   Status New     PT LONG TERM GOAL #3   Title Patient will improve L ankle pain to the worse pain being 2/10 on the VAS to demonstrate significant improvement in pain.    Baseline VAS: 7/10   Time 6   Period Weeks   Status New     PT LONG TERM GOAL #4   Title Patient will be independent with HEP to continues benefits of therapy after discharge.    Baseline dependent with exercise progression and performacne   Time 6   Period Weeks   Status New               Plan - 05/27/16 1627    Clinical Impression Statement Patient demonstrates decreased pain with exercises indicating improved muscular endurance and coordination. Although patient is improving, he continues to demonstrate decreased ankle inversion in both Closed and open kinetic chain, patient will benefit from further skilled therapy to return to prior level of function.    Rehab Potential Fair   Clinical Impairments Affecting Rehab Potential (+) highly motivated, family support (-) chronicity of impairment, previous TBI   PT Frequency 2x / week   PT Duration 6 weeks   PT Treatment/Interventions Manual techniques;Neuromuscular re-education;Gait training;Stair training;Therapeutic activities;Therapeutic exercise;Balance training;Patient/family education;Functional mobility training;Moist Heat;Ultrasound;Electrical Stimulation   PT Next Visit Plan Progress strengthening and balancing techniques   PT Home Exercise Plan Single leg stance, Tandem stance   Consulted and Agree with Plan of Care Patient      Patient will benefit from skilled therapeutic intervention in order to improve the following deficits and impairments:  Abnormal gait, Decreased balance, Pain, Impaired sensation, Decreased mobility, Increased muscle spasms, Decreased endurance, Decreased  strength, Decreased range of motion, Difficulty walking, Decreased activity tolerance  Visit Diagnosis: Difficulty in walking, not elsewhere classified  Unsteadiness on feet  Other lack of coordination     Problem List There are no active problems to display for this patient.   Myrene Galas, PT DPT 05/27/2016, 4:43 PM  [facility] MAIN [facility] SERVICES [address] [place], [place], [address] Phone: [phone]   Fax:  [phone]  Name: Blake [name] MRN: [id number] Date of Birth: 1950/11/21

## 2016-05-29 ENCOUNTER — Ambulatory Visit: Payer: Medicare Other

## 2016-05-29 DIAGNOSIS — R262 Difficulty in walking, not elsewhere classified: Secondary | ICD-10-CM

## 2016-05-29 DIAGNOSIS — R2681 Unsteadiness on feet: Secondary | ICD-10-CM

## 2016-05-29 NOTE — Therapy (Signed)
[place] [facility] MAIN [facility] SERVICES [address] [place], [place], [address] Phone: [phone]   Fax:  [phone]  Physical Therapy Treatment  Patient Details  Name: Blake [name] MRN: [id number] Date of Birth: 1950/12/30 Referring Provider: Dorothey Baseman MD  Encounter Date: 05/29/2016      PT End of Session - 05/29/16 1633    Visit Number 8   Number of Visits 12   Date for PT Re-Evaluation 06/12/16   Authorization Type 8/10 G code   PT Start Time 1515   PT Stop Time 1600   PT Time Calculation (min) 45 min   Equipment Utilized During Treatment Gait belt   Activity Tolerance Patient tolerated treatment well   Behavior During Therapy [facility] for tasks assessed/performed      Past Medical History:  Diagnosis Date  . Asperger's syndrome    (per wife)  . Coronary artery disease   . Dementia   . Depression   . GERD (gastroesophageal reflux disease)   . Headache    stress  . Hearing loss   . Hyperlipidemia   . Hypertension   . 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 Myocardial infarction 08/2013  . OCD (obsessive compulsive disorder)   . Prostatic hypertrophy   . Raynaud's disease   . Repeated falls    weak left ankle  . Scoliosis   . Stroke (HCC) 2/16   "light"  . Synovitis    knees, ankles    Past Surgical History:  Procedure Laterality Date  . BACK SURGERY  2011   rods and screws  . COLONOSCOPY  2015  . CYSTOSCOPY  1984  . EYE SURGERY Bilateral 2005,2006,2009   detached retina, cataract  . FOOT ARTHRODESIS Left 05/30/2015   Procedure: ARTHRODESIS FOOT STJ LT FOOT;  Surgeon: [name], DPM;  Location: [facility];  Service: Podiatry;  Laterality: Left;  WITH POPLITEAL BLOCK  . FOOT SURGERY Left   . HERNIA REPAIR Right 1969   inguinal  . SHOULDER SURGERY Right 2004  . skull surgery    . TOE SURGERY Right 2009  .  TONSILLECTOMY  2008    There were no vitals filed for this visit.      Subjective Assessment - 05/29/16 1625    Subjective Patient reports his ankle continues to feel better with movement and when walking   Pertinent History Hx of TBI (2011); L ankle fx 1 year prior   Limitations Standing;Walking   How long can you walk comfortably? 2 miles    Patient Stated Goals Reduction of pain when walking.    Currently in Pain? Yes   Pain Score 1    Pain Location Ankle   Pain Orientation Left   Pain Descriptors / Indicators Aching   Pain Type Chronic pain   Pain Onset More than a month ago        TREATMENT: Manual therapy: STM with patient positioned in sitting with focus on addressing increased pain and spasms along 4th-5th metatarsal and plantar fascia. Mobilizations along 3-4-5th digits grade III to improve joint movement and decrease pain.    Therapeutic exercise: Heel/toe raises on ground - x20 Heel/toe ant/post weight shift on half foam - x25 SLS on dynadisc on L LE - Ant/post, laterally - x25 B Tandem stance B on half foam - 2 min B with intermittent  UE support        PT Education - 05/29/16 1627    Education provided Yes   Education Details Educated on ankle positioning when balancing   Person(s) Educated Patient   Methods Explanation;Demonstration   Comprehension Verbalized understanding;Returned demonstration             PT Long Term Goals - 05/01/16 1602      PT LONG TERM GOAL #1   Title Patient will improve BERG to 56/56 to demonstrate significant improvement in static balance and improved ability to donn pants in standing   Baseline BERG 50/56   Time 6   Period Weeks   Status New     PT LONG TERM GOAL #2   Title Patient will improve DGI to 24/24 to demonstrate significant improvement in dynamic balance and ability to walk without falling   Baseline DGI: 19/24   Time 6   Period Weeks   Status New     PT LONG TERM GOAL #3   Title Patient will  improve L ankle pain to the worse pain being 2/10 on the VAS to demonstrate significant improvement in pain.    Baseline VAS: 7/10   Time 6   Period Weeks   Status New     PT LONG TERM GOAL #4   Title Patient will be independent with HEP to continues benefits of therapy after discharge.    Baseline dependent with exercise progression and performacne   Time 6   Period Weeks   Status New               Plan - 05/29/16 1633    Clinical Impression Statement Patient demonstrates increased posutral sway and increased ankle/foot pain when performing single leg exercises today indicating decreased motor control and weight tolerance onto the affected LE. Patient continues to demonstrate minor increase in pain when walking but has significantly decreased since initial visits. Patient will benefit from futher skilled therapy to return to prior level of function.   Rehab Potential Fair   Clinical Impairments Affecting Rehab Potential (+) highly motivated, family support (-) chronicity of impairment, previous TBI   PT Frequency 2x / week   PT Duration 6 weeks   PT Treatment/Interventions Manual techniques;Neuromuscular re-education;Gait training;Stair training;Therapeutic activities;Therapeutic exercise;Balance training;Patient/family education;Functional mobility training;Moist Heat;Ultrasound;Electrical Stimulation   PT Next Visit Plan Progress strengthening and balancing techniques   PT Home Exercise Plan Single leg stance, Tandem stance   Consulted and Agree with Plan of Care Patient      Patient will benefit from skilled therapeutic intervention in order to improve the following deficits and impairments:  Abnormal gait, Decreased balance, Pain, Impaired sensation, Decreased mobility, Increased muscle spasms, Decreased endurance, Decreased strength, Decreased range of motion, Difficulty walking, Decreased activity tolerance  Visit Diagnosis: Difficulty in walking, not elsewhere  classified  Unsteadiness on feet     Problem List There are no active problems to display for this patient.   Myrene Galas, PT DPT 05/29/2016, 4:41 PM  [place] [facility] MAIN [facility] SERVICES [address] [place], [place], [address] Phone: [phone]   Fax:  [phone]  Name: TYKEE HEIDEMAN MRN: [id number] Date of Birth: 01/18/1951

## 2016-06-03 ENCOUNTER — Ambulatory Visit: Payer: Medicare Other

## 2016-06-03 DIAGNOSIS — R278 Other lack of coordination: Secondary | ICD-10-CM

## 2016-06-03 DIAGNOSIS — R262 Difficulty in walking, not elsewhere classified: Secondary | ICD-10-CM

## 2016-06-03 DIAGNOSIS — R2681 Unsteadiness on feet: Secondary | ICD-10-CM

## 2016-06-03 NOTE — Therapy (Signed)
[facility] MAIN [facility] SERVICES [address] [place], [place], [address] Phone: [phone]   Fax:  [phone]  Physical Therapy Treatment  Patient Details  Name: Blake [name] MRN: [id number] Date of Birth: 12/14/1950 Referring Provider: Dorothey Baseman MD  Encounter Date: 06/03/2016      PT End of Session - 06/03/16 1624    Visit Number 9   Number of Visits 12   Date for PT Re-Evaluation 06/12/16   Authorization Type 9/10 G code   PT Start Time 1521   PT Stop Time 1603   PT Time Calculation (min) 42 min   Equipment Utilized During Treatment Gait belt   Activity Tolerance Patient tolerated treatment well   Behavior During Therapy [facility] for tasks assessed/performed      Past Medical History:  Diagnosis Date  . Asperger's syndrome    (per wife)  . Coronary artery disease   . Dementia   . Depression   . GERD (gastroesophageal reflux disease)   . Headache    stress  . Hearing loss   . Hyperlipidemia   . Hypertension   . 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 Myocardial infarction 08/2013  . OCD (obsessive compulsive disorder)   . Prostatic hypertrophy   . Raynaud's disease   . Repeated falls    weak left ankle  . Scoliosis   . Stroke (HCC) 2/16   "light"  . Synovitis    knees, ankles    Past Surgical History:  Procedure Laterality Date  . BACK SURGERY  2011   rods and screws  . COLONOSCOPY  2015  . CYSTOSCOPY  1984  . EYE SURGERY Bilateral 2005,2006,2009   detached retina, cataract  . FOOT ARTHRODESIS Left 05/30/2015   Procedure: ARTHRODESIS FOOT STJ LT FOOT;  Surgeon: [name], DPM;  Location: [facility];  Service: Podiatry;  Laterality: Left;  WITH POPLITEAL BLOCK  . FOOT SURGERY Left   . HERNIA REPAIR Right 1969   inguinal  . SHOULDER SURGERY Right 2004  . skull surgery    . TOE SURGERY Right 2009  .  TONSILLECTOMY  2008    There were no vitals filed for this visit.      Subjective Assessment - 06/03/16 1618    Subjective Patient reports he has not gone on any walks since the last visit secondary to the rain. Patient reports his ankle is feeling better compared to the previous visit.    Pertinent History Hx of TBI (2011); L ankle fx 1 year prior   Limitations Standing;Walking   How long can you walk comfortably? 2 miles    Patient Stated Goals Reduction of pain when walking.    Currently in Pain? Yes   Pain Score 1    Pain Orientation Left   Pain Descriptors / Indicators Aching   Pain Type Chronic pain   Pain Onset More than a month ago   Pain Frequency Constant        TREATMENT: Manual therapy: STM with patient positioned in sitting with focus on addressing increased pain and spasms along 4th-5th metatarsal and plantar fascia. Mobilizations along 3-4-5th digits grade III to improve joint movement and decrease pain.    Therapeutic exercise: Heel/toe raises on ground - x25 Tandem stance with intermittent UE support - 2 x 30sec  Marches on balance stones -  x25 Recumbent bike resistance level 13 with cueing on speed and time to perform - x23min  SLS on dynadisc on L LE - Ant/post, laterally - x25 B Elliptical trial - 2 min; no resistance initial pain 7/10: cued to decrease speed with movement and pain decreased to 5/10; educated patient to stop not perform exercise at home secondary to increased pain       PT Education - 06/03/16 1621    Education provided Yes   Education Details Viewed form/technique  with HEP exercises   Person(s) Educated Patient   Methods Explanation;Demonstration   Comprehension Verbalized understanding;Returned demonstration             PT Long Term Goals - 05/01/16 1602      PT LONG TERM GOAL #1   Title Patient will improve BERG to 56/56 to demonstrate significant improvement in static balance and improved ability to donn pants in  standing   Baseline BERG 50/56   Time 6   Period Weeks   Status New     PT LONG TERM GOAL #2   Title Patient will improve DGI to 24/24 to demonstrate significant improvement in dynamic balance and ability to walk without falling   Baseline DGI: 19/24   Time 6   Period Weeks   Status New     PT LONG TERM GOAL #3   Title Patient will improve L ankle pain to the worse pain being 2/10 on the VAS to demonstrate significant improvement in pain.    Baseline VAS: 7/10   Time 6   Period Weeks   Status New     PT LONG TERM GOAL #4   Title Patient will be independent with HEP to continues benefits of therapy after discharge.    Baseline dependent with exercise progression and performacne   Time 6   Period Weeks   Status New               Plan - 06/03/16 1626    Clinical Impression Statement Trialed elipitical machine today to test ankle pain tolerance; patient demonstrated increased pain during use which decreased after cueing to decrease speed indicating decreased motor control. Patient conitnues to have increased pain with ankle stabilization activities and will benefit from further skilled therapy focused on improving pain to allow for greater performance of balance activities and decrease in fall risk.   Rehab Potential Fair   Clinical Impairments Affecting Rehab Potential (+) highly motivated, family support (-) chronicity of impairment, previous TBI   PT Frequency 2x / week   PT Duration 6 weeks   PT Treatment/Interventions Manual techniques;Neuromuscular re-education;Gait training;Stair training;Therapeutic activities;Therapeutic exercise;Balance training;Patient/family education;Functional mobility training;Moist Heat;Ultrasound;Electrical Stimulation   PT Next Visit Plan Progress strengthening,  balancing, further decrease in ankle pain   PT Home Exercise Plan Single leg stance, Tandem stance   Consulted and Agree with Plan of Care Patient      Patient will benefit from  skilled therapeutic intervention in order to improve the following deficits and impairments:  Abnormal gait, Decreased balance, Pain, Impaired sensation, Decreased mobility, Increased muscle spasms, Decreased endurance, Decreased strength, Decreased range of motion, Difficulty walking, Decreased activity tolerance  Visit Diagnosis: Difficulty in walking, not elsewhere classified  Unsteadiness on feet  Other lack of coordination     Problem List There are no active problems to display for this patient.   Myrene Galas, PT DPT 06/03/2016, 4:34 PM  [place] [facility] MAIN [facility] SERVICES [address]  [place], [place], [address] Phone: [phone]   Fax:  [phone]  Name: Blake [name] MRN: [id number] Date of Birth: 08-08-1950

## 2016-06-05 ENCOUNTER — Ambulatory Visit: Payer: Medicare Other

## 2016-06-09 ENCOUNTER — Ambulatory Visit: Payer: Medicare Other

## 2016-06-09 DIAGNOSIS — R278 Other lack of coordination: Secondary | ICD-10-CM

## 2016-06-09 DIAGNOSIS — R2681 Unsteadiness on feet: Secondary | ICD-10-CM

## 2016-06-09 DIAGNOSIS — R262 Difficulty in walking, not elsewhere classified: Secondary | ICD-10-CM

## 2016-06-09 NOTE — Therapy (Signed)
[facility] MAIN [facility] SERVICES [address] [place], [place], [address] Phone: [phone]   Fax:  [phone]  Physical Therapy Treatment  Patient Details  Name: Blake [name] MRN: [id number] Date of Birth: 1951-01-12 Referring Provider: Dorothey Baseman MD  Encounter Date: 06/09/2016      PT End of Session - 06/09/16 1709    Visit Number 10   Number of Visits 12   Date for PT Re-Evaluation 06/12/16   Authorization Type 10/10 G code   PT Start Time 1522   PT Stop Time 1600   PT Time Calculation (min) 38 min   Equipment Utilized During Treatment Gait belt   Activity Tolerance Patient tolerated treatment well   Behavior During Therapy [facility] for tasks assessed/performed      Past Medical History:  Diagnosis Date  . Asperger's syndrome    (per wife)  . Coronary artery disease   . Dementia   . Depression   . GERD (gastroesophageal reflux disease)   . Headache    stress  . Hearing loss   . Hyperlipidemia   . Hypertension   . 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 Myocardial infarction 08/2013  . OCD (obsessive compulsive disorder)   . Prostatic hypertrophy   . Raynaud's disease   . Repeated falls    weak left ankle  . Scoliosis   . Stroke (HCC) 2/16   "light"  . Synovitis    knees, ankles    Past Surgical History:  Procedure Laterality Date  . BACK SURGERY  2011   rods and screws  . COLONOSCOPY  2015  . CYSTOSCOPY  1984  . EYE SURGERY Bilateral 2005,2006,2009   detached retina, cataract  . FOOT ARTHRODESIS Left 05/30/2015   Procedure: ARTHRODESIS FOOT STJ LT FOOT;  Surgeon: [name], DPM;  Location: [facility];  Service: Podiatry;  Laterality: Left;  WITH POPLITEAL BLOCK  . FOOT SURGERY Left   . HERNIA REPAIR Right 1969   inguinal  . SHOULDER SURGERY Right 2004  . skull surgery    . TOE SURGERY Right 2009  .  TONSILLECTOMY  2008    There were no vitals filed for this visit.      Subjective Assessment - 06/09/16 1627    Subjective Patient reports he feels the ankle is improving everyday.    Pertinent History Hx of TBI (2011); L ankle fx 1 year prior   Limitations Standing;Walking   How long can you walk comfortably? 2 miles    Patient Stated Goals Reduction of pain when walking.    Currently in Pain? Yes   Pain Score 3    Pain Location Ankle   Pain Orientation Left   Pain Descriptors / Indicators Aching   Pain Type Chronic pain   Pain Onset More than a month ago   Pain Frequency Constant            [facility] PT Assessment - 06/09/16 0001      Berg Balance Test   Sit to Stand Able to stand without using hands and stabilize independently   Standing Unsupported Able to stand safely 2 minutes   Sitting with Back Unsupported but Feet Supported on Floor or Stool Able to sit safely and securely 2 minutes   Stand to Sit Sits safely with minimal use of hands   Transfers  Able to transfer safely, minor use of hands   Standing Unsupported with Eyes Closed Able to stand 10 seconds safely   Standing Ubsupported with Feet Together Able to place feet together independently and stand 1 minute safely   From Standing, Reach Forward with Outstretched Arm Can reach confidently >25 cm (10")   From Standing Position, Pick up Object from Floor Able to pick up shoe safely and easily   From Standing Position, Turn to Look Behind Over each Shoulder Looks behind from both sides and weight shifts well   Turn 360 Degrees Able to turn 360 degrees safely one side only in 4 seconds or less   Standing Unsupported, Alternately Place Feet on Step/Stool Able to stand independently and safely and complete 8 steps in 20 seconds   Standing Unsupported, One Foot in Front Able to plae foot ahead of the other independently and hold 30 seconds   Standing on One Leg Able to lift leg independently and hold 5-10 seconds   Total  Score 53     Dynamic Gait Index   Level Surface Normal   Change in Gait Speed Normal   Gait with Horizontal Head Turns Normal   Gait with Vertical Head Turns Normal   Gait and Pivot Turn Normal   Step Over Obstacle Mild Impairment   Step Around Obstacles Normal   Steps Mild Impairment   Total Score 22         TREATMENT: Manual therapy: STM with patient positioned in sitting with focus on addressing increased pain and spasms along 4th-5th metatarsal and plantar fascia. Mobilizations along 3-4-5th digits grade III to improve joint movement and decrease pain.    Therapeutic exercise: Heel/toe raises on ground - x25 Tandem stance with intermittent UE support - 2 x 30sec  Obstacle course stepping over box - x3 with zig-zagging around cones Single leg stance balance - 2 x 30sec B Feet together Balance on half blue foam - x 20 with intermittent UE support Stairs performance up and down (step over step) - x5 without UE support        PT Education - 06/09/16 1630    Education provided Yes   Education Details form/technique with HEP    Person(s) Educated Patient   Methods Explanation;Demonstration   Comprehension Verbalized understanding;Returned demonstration             PT Long Term Goals - 06/09/16 1654      PT LONG TERM GOAL #1   Title Patient will improve BERG to 56/56 to demonstrate significant improvement in static balance and improved ability to donn pants in standing   Baseline BERG 50/56; 06/09/16: 53/56   Time 6   Period Weeks   Status On-going     PT LONG TERM GOAL #2   Title Patient will improve DGI to 24/24 to demonstrate significant improvement in dynamic balance and ability to walk without falling   Baseline DGI: 19/24; 06/09/16: 22/24   Time 6   Period Weeks   Status On-going     PT LONG TERM GOAL #3   Title Patient will improve L ankle pain to the worse pain being 2/10 on the VAS to demonstrate significant improvement in pain.    Baseline VAS:  7/10; 06/09/16: 3/10   Time 6   Period Weeks   Status On-going     PT LONG TERM GOAL #4   Title Patient will be independent with HEP to continues benefits of therapy after discharge.    Baseline  dependent with exercise progression and performacne; 07-04-16: Moderate cueing required for proper technique   Time 6   Period Weeks   Status On-going               Plan - Jul 04, 2016 1721    Clinical Impression Statement Patient demonstrates improvement in long term goals with improvements in DGI, resting pain levels, and BERG balance scores indicating improvement in ankle dysfunction and static/dynamic balance. Although pt is improving, he continues to demonstrate decreased SLS stance time and pain with weight bearing. Patient will benefit from further skilled therapy to return to prior level of function.    Rehab Potential Fair   Clinical Impairments Affecting Rehab Potential (+) highly motivated, family support (-) chronicity of impairment, previous TBI   PT Frequency 2x / week   PT Duration 6 weeks   PT Treatment/Interventions Manual techniques;Neuromuscular re-education;Gait training;Stair training;Therapeutic activities;Therapeutic exercise;Balance training;Patient/family education;Functional mobility training;Moist Heat;Ultrasound;Electrical Stimulation   PT Next Visit Plan Progress strengthening,  balancing, further decrease in ankle pain   PT Home Exercise Plan Single leg stance, Tandem stance   Consulted and Agree with Plan of Care Patient      Patient will benefit from skilled therapeutic intervention in order to improve the following deficits and impairments:  Abnormal gait, Decreased balance, Pain, Impaired sensation, Decreased mobility, Increased muscle spasms, Decreased endurance, Decreased strength, Decreased range of motion, Difficulty walking, Decreased activity tolerance  Visit Diagnosis: Difficulty in walking, not elsewhere classified  Unsteadiness on feet  Other lack  of coordination       G-Codes - 2016-07-04 1710    Functional Assessment Tool Used BERG, DGI, clinical judgement   Functional Limitation Mobility: Walking and moving around   Mobility: Walking and Moving Around Current Status [phone]) At least 1 percent but less than 20 percent impaired, limited or restricted   Mobility: Walking and Moving Around Goal Status [phone]) At least 1 percent but less than 20 percent impaired, limited or restricted      Problem List There are no active problems to display for this patient.   Myrene Galas, PT DPT 07/04/2016, 5:28 PM  [place] [facility] MAIN [facility] SERVICES [address] [place], [place], [address] Phone: [phone]   Fax:  [phone]  Name: LUQMAN PERRELLI MRN: [id number] Date of Birth: 1950-05-28

## 2016-06-11 ENCOUNTER — Ambulatory Visit: Payer: Medicare Other

## 2016-06-11 DIAGNOSIS — R262 Difficulty in walking, not elsewhere classified: Secondary | ICD-10-CM

## 2016-06-11 DIAGNOSIS — R2681 Unsteadiness on feet: Secondary | ICD-10-CM

## 2016-06-11 DIAGNOSIS — R278 Other lack of coordination: Secondary | ICD-10-CM

## 2016-06-11 NOTE — Therapy (Signed)
[place] [facility] MAIN [facility] SERVICES [address] [place], [place], [address] Phone: [phone]   Fax:  [phone]  Physical Therapy Treatment  Patient Details  Name: Blake [name] MRN: [id number] Date of Birth: 11/18/1950 Referring Provider: Dorothey Baseman MD  Encounter Date: 06/11/2016      PT End of Session - 06/11/16 1555    Visit Number 11   Number of Visits 20   Date for PT Re-Evaluation 07/07/16   Authorization Type 1/10 G code   PT Start Time 1522   PT Stop Time 1600   PT Time Calculation (min) 38 min   Equipment Utilized During Treatment Gait belt   Activity Tolerance Patient tolerated treatment well   Behavior During Therapy [facility] for tasks assessed/performed      Past Medical History:  Diagnosis Date  . Asperger's syndrome    (per wife)  . Coronary artery disease   . Dementia   . Depression   . GERD (gastroesophageal reflux disease)   . Headache    stress  . Hearing loss   . Hyperlipidemia   . Hypertension   . 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 Myocardial infarction 08/2013  . OCD (obsessive compulsive disorder)   . Prostatic hypertrophy   . Raynaud's disease   . Repeated falls    weak left ankle  . Scoliosis   . Stroke (HCC) 2/16   "light"  . Synovitis    knees, ankles    Past Surgical History:  Procedure Laterality Date  . BACK SURGERY  2011   rods and screws  . COLONOSCOPY  2015  . CYSTOSCOPY  1984  . EYE SURGERY Bilateral 2005,2006,2009   detached retina, cataract  . FOOT ARTHRODESIS Left 05/30/2015   Procedure: ARTHRODESIS FOOT STJ LT FOOT;  Surgeon: [name], DPM;  Location: [facility];  Service: Podiatry;  Laterality: Left;  WITH POPLITEAL BLOCK  . FOOT SURGERY Left   . HERNIA REPAIR Right 1969   inguinal  . SHOULDER SURGERY Right 2004  . skull surgery    . TOE SURGERY Right 2009  .  TONSILLECTOMY  2008    There were no vitals filed for this visit.      Subjective Assessment - 06/11/16 1550    Subjective Patient reports he cannot tell a difference between wearing the brace when walking.    Pertinent History Hx of TBI (2011); L ankle fx 1 year prior   Limitations Standing;Walking   How long can you walk comfortably? 2 miles    Patient Stated Goals Reduction of pain when walking.    Currently in Pain? Yes   Pain Score 2    Pain Location Ankle   Pain Orientation Left   Pain Descriptors / Indicators Aching   Pain Type Chronic pain   Pain Onset More than a month ago   Pain Frequency Constant      TREATMENT: Manual therapy: STM with patient positioned in sitting with focus on addressing increased pain and spasms along 4th-5th metatarsal and plantar fascia & mobilizations along 3-4-5th digits grade IV to improve joint movement and decrease pain.    Therapeutic exercise: Dorsiflexion/plantarflexion on green dynadisc - forward/backward x2 min B Inversion/Eversion on green dynadisc -x26min B Heel/toe raises on green dynadisc  - x25 with B UE Support Single leg stance on black side of  Bosu - 2 x 1 min Lateral weight shifts on black side of Bosu - x20 intermittent UE support       PT Education - 06/11/16 1554    Education provided Yes   Education Details form/technique with HEP   Person(s) Educated Patient   Methods Explanation;Demonstration   Comprehension Verbalized understanding;Returned demonstration             PT Long Term Goals - 06/09/16 1654      PT LONG TERM GOAL #1   Title Patient will improve BERG to 56/56 to demonstrate significant improvement in static balance and improved ability to donn pants in standing   Baseline BERG 50/56; 06/09/16: 53/56   Time 6   Period Weeks   Status On-going     PT LONG TERM GOAL #2   Title Patient will improve DGI to 24/24 to demonstrate significant improvement in dynamic balance and ability to walk without  falling   Baseline DGI: 19/24; 06/09/16: 22/24   Time 6   Period Weeks   Status On-going     PT LONG TERM GOAL #3   Title Patient will improve L ankle pain to the worse pain being 2/10 on the VAS to demonstrate significant improvement in pain.    Baseline VAS: 7/10; 06/09/16: 3/10   Time 6   Period Weeks   Status On-going     PT LONG TERM GOAL #4   Title Patient will be independent with HEP to continues benefits of therapy after discharge.    Baseline dependent with exercise progression and performacne; 06/09/16: Moderate cueing required for proper technique   Time 6   Period Weeks   Status On-going               Plan - 06/11/16 1558    Clinical Impression Statement Patient demonstrates improvement in ankle stabilization demonstrating less requirement of UE support to perform exercises and less postural sway. Patient continues to demonstrate decreased strength througout ankle movement and will benefit from further skilled therapy to return to prior level of function.    Rehab Potential Fair   Clinical Impairments Affecting Rehab Potential (+) highly motivated, family support (-) chronicity of impairment, previous TBI   PT Frequency 2x / week   PT Duration 6 weeks   PT Treatment/Interventions Manual techniques;Neuromuscular re-education;Gait training;Stair training;Therapeutic activities;Therapeutic exercise;Balance training;Patient/family education;Functional mobility training;Moist Heat;Ultrasound;Electrical Stimulation   PT Next Visit Plan Progress strengthening,  balancing, further decrease in ankle pain   PT Home Exercise Plan Single leg stance, Tandem stance   Consulted and Agree with Plan of Care Patient      Patient will benefit from skilled therapeutic intervention in order to improve the following deficits and impairments:  Abnormal gait, Decreased balance, Pain, Impaired sensation, Decreased mobility, Increased muscle spasms, Decreased endurance, Decreased strength,  Decreased range of motion, Difficulty walking, Decreased activity tolerance  Visit Diagnosis: Difficulty in walking, not elsewhere classified  Unsteadiness on feet  Other lack of coordination     Problem List There are no active problems to display for this patient.   Myrene Galas, PT DPT 06/11/2016, 4:15 PM  [place] [facility] MAIN [facility] SERVICES [address] [place], [place], [address] Phone: [phone]   Fax:  [phone]  Name: PETAR REDDER MRN: [id number] Date of Birth: [birth year]

## 2016-06-16 ENCOUNTER — Ambulatory Visit: Payer: Medicare Other

## 2016-06-16 DIAGNOSIS — R262 Difficulty in walking, not elsewhere classified: Secondary | ICD-10-CM | POA: Diagnosis not present

## 2016-06-16 DIAGNOSIS — R2681 Unsteadiness on feet: Secondary | ICD-10-CM

## 2016-06-16 DIAGNOSIS — R278 Other lack of coordination: Secondary | ICD-10-CM

## 2016-06-16 NOTE — Therapy (Signed)
[address] [facility] MAIN [facility] SERVICES [address] [place], [place], [address] Phone: [phone]   Fax:  [phone]  Physical Therapy Treatment  Patient Details  Name: Blake [name] MRN: [id number] Date of Birth: 1950/05/10 Referring Provider: Dorothey Baseman MD  Encounter Date: 06/16/2016      PT End of Session - 06/16/16 1422    Visit Number 12   Number of Visits 20   Date for PT Re-Evaluation 07/07/16   Authorization Type 2/10 G code   PT Start Time 1351   PT Stop Time 1430   PT Time Calculation (min) 39 min   Equipment Utilized During Treatment Gait belt   Activity Tolerance Patient tolerated treatment well   Behavior During Therapy [facility] for tasks assessed/performed      Past Medical History:  Diagnosis Date  . Asperger's syndrome    (per wife)  . Coronary artery disease   . Dementia   . Depression   . GERD (gastroesophageal reflux disease)   . Headache    stress  . Hearing loss   . Hyperlipidemia   . Hypertension   . 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 Myocardial infarction 08/2013  . OCD (obsessive compulsive disorder)   . Prostatic hypertrophy   . Raynaud's disease   . Repeated falls    weak left ankle  . Scoliosis   . Stroke (HCC) 2/16   "light"  . Synovitis    knees, ankles    Past Surgical History:  Procedure Laterality Date  . BACK SURGERY  2011   rods and screws  . COLONOSCOPY  2015  . CYSTOSCOPY  1984  . EYE SURGERY Bilateral 2005,2006,2009   detached retina, cataract  . FOOT ARTHRODESIS Left 05/30/2015   Procedure: ARTHRODESIS FOOT STJ LT FOOT;  Surgeon: [name], DPM;  Location: [facility];  Service: Podiatry;  Laterality: Left;  WITH POPLITEAL BLOCK  . FOOT SURGERY Left   . HERNIA REPAIR Right 1969   inguinal  . SHOULDER SURGERY Right 2004  . skull surgery    . TOE SURGERY Right 2009  .  TONSILLECTOMY  2008    There were no vitals filed for this visit.      Subjective Assessment - 06/16/16 1355    Subjective Patient reports he fell when trying to step over a ditch in his yard. His wife reports through a note that she woulud like him not to carry items down the stairs or walk up hill.    Pertinent History Hx of TBI (2011); L ankle fx 1 year prior   Limitations Standing;Walking   How long can you walk comfortably? 2 miles    Patient Stated Goals Reduction of pain when walking.    Currently in Pain? Yes   Pain Score 2    Pain Location Ankle   Pain Orientation Left   Pain Descriptors / Indicators Aching   Pain Type Chronic pain   Pain Onset More than a month ago   Pain Frequency Constant      TREATMENT: Manual therapy: STM with patient positioned in sitting with focus on addressing increased pain and spasms along 4th-5th metatarsal and plantar fascia & mobilizations along 3-4-5th digits grade IV to improve joint movement and decrease pain.    Therapeutic exercise: Dorsiflexion/plantarflexion on green dynadisc - forward/backward x2 min B Inversion/Eversion on  green dynadisc -x31min B Tandem stance on half foam roller - B Feet together balance on half foam roller  -- 1 min Marches on bosu ball - x20 with minimal Heel raises on the ground - x25       PT Education - 06/16/16 1421    Education provided Yes   Education Details form/technique with HEP   Person(s) Educated Patient   Methods Explanation;Demonstration   Comprehension Verbalized understanding;Returned demonstration             PT Long Term Goals - 06/09/16 1654      PT LONG TERM GOAL #1   Title Patient will improve BERG to 56/56 to demonstrate significant improvement in static balance and improved ability to donn pants in standing   Baseline BERG 50/56; 06/09/16: 53/56   Time 6   Period Weeks   Status On-going     PT LONG TERM GOAL #2   Title Patient will improve DGI to 24/24 to  demonstrate significant improvement in dynamic balance and ability to walk without falling   Baseline DGI: 19/24; 06/09/16: 22/24   Time 6   Period Weeks   Status On-going     PT LONG TERM GOAL #3   Title Patient will improve L ankle pain to the worse pain being 2/10 on the VAS to demonstrate significant improvement in pain.    Baseline VAS: 7/10; 06/09/16: 3/10   Time 6   Period Weeks   Status On-going     PT LONG TERM GOAL #4   Title Patient will be independent with HEP to continues benefits of therapy after discharge.    Baseline dependent with exercise progression and performacne; 06/09/16: Moderate cueing required for proper technique   Time 6   Period Weeks   Status On-going               Plan - 06/16/16 1437    Clinical Impression Statement Educated patient on decreasing fall risk with activities at home as patient experienced 2 falls over the weekend. Patient demonstrates decreased pain with ankle mobility and coordination exercises indicating functional carryover between visits and patient will benefit from further skilled therapy to return to prior level of function.    Rehab Potential Fair   Clinical Impairments Affecting Rehab Potential (+) highly motivated, family support (-) chronicity of impairment, previous TBI   PT Frequency 2x / week   PT Duration 6 weeks   PT Treatment/Interventions Manual techniques;Neuromuscular re-education;Gait training;Stair training;Therapeutic activities;Therapeutic exercise;Balance training;Patient/family education;Functional mobility training;Moist Heat;Ultrasound;Electrical Stimulation   PT Next Visit Plan Progress strengthening,  balancing, further decrease in ankle pain   PT Home Exercise Plan Single leg stance, Tandem stance   Consulted and Agree with Plan of Care Patient      Patient will benefit from skilled therapeutic intervention in order to improve the following deficits and impairments:  Abnormal gait, Decreased balance,  Pain, Impaired sensation, Decreased mobility, Increased muscle spasms, Decreased endurance, Decreased strength, Decreased range of motion, Difficulty walking, Decreased activity tolerance  Visit Diagnosis: Difficulty in walking, not elsewhere classified  Unsteadiness on feet  Other lack of coordination     Problem List There are no active problems to display for this patient.   Myrene Galas, PT DPT 06/16/2016, 2:42 PM  [place] [facility] MAIN [facility] SERVICES [address] [place], [place], [address] Phone: [phone]   Fax:  [phone]  Name: Blake [name] MRN: [id number] Date of Birth: 09-09-1950

## 2016-06-18 ENCOUNTER — Ambulatory Visit: Payer: Medicare Other

## 2016-06-18 DIAGNOSIS — R2681 Unsteadiness on feet: Secondary | ICD-10-CM

## 2016-06-18 DIAGNOSIS — R262 Difficulty in walking, not elsewhere classified: Secondary | ICD-10-CM

## 2016-06-18 DIAGNOSIS — R278 Other lack of coordination: Secondary | ICD-10-CM

## 2016-06-18 NOTE — Therapy (Signed)
[facility] MAIN [facility] SERVICES [address] [place], [place], [address] Phone: [phone]   Fax:  [phone]  Physical Therapy Treatment  Patient Details  Name: Blake [name] MRN: [id number] Date of Birth: 09-09-1950 Referring Provider: Dorothey Baseman MD  Encounter Date: 06/18/2016      PT End of Session - 06/18/16 1344    Visit Number 13   Number of Visits 20   Date for PT Re-Evaluation 07/07/16   Authorization Type 3/10 G code   PT Start Time 1303   PT Stop Time 1345   PT Time Calculation (min) 42 min   Equipment Utilized During Treatment Gait belt   Activity Tolerance Patient tolerated treatment well   Behavior During Therapy [facility] for tasks assessed/performed      Past Medical History:  Diagnosis Date  . Asperger's syndrome    (per wife)  . Coronary artery disease   . Dementia   . Depression   . GERD (gastroesophageal reflux disease)   . Headache    stress  . Hearing loss   . Hyperlipidemia   . Hypertension   . 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 Myocardial infarction 08/2013  . OCD (obsessive compulsive disorder)   . Prostatic hypertrophy   . Raynaud's disease   . Repeated falls    weak left ankle  . Scoliosis   . Stroke (HCC) 2/16   "light"  . Synovitis    knees, ankles    Past Surgical History:  Procedure Laterality Date  . BACK SURGERY  2011   rods and screws  . COLONOSCOPY  2015  . CYSTOSCOPY  1984  . EYE SURGERY Bilateral 2005,2006,2009   detached retina, cataract  . FOOT ARTHRODESIS Left 05/30/2015   Procedure: ARTHRODESIS FOOT STJ LT FOOT;  Surgeon: [name], DPM;  Location: [facility];  Service: Podiatry;  Laterality: Left;  WITH POPLITEAL BLOCK  . FOOT SURGERY Left   . HERNIA REPAIR Right 1969   inguinal  . SHOULDER SURGERY Right 2004  . skull surgery    . TOE SURGERY Right 2009  .  TONSILLECTOMY  2008    There were no vitals filed for this visit.      Subjective Assessment - 06/18/16 1337    Subjective Patient reports he tripped when walking into the [employer] and again once inside the restaurant. Patient reports no falls when he stumbled.    Pertinent History Hx of TBI (2011); L ankle fx 1 year prior   Limitations Standing;Walking   How long can you walk comfortably? 2 miles    Patient Stated Goals Reduction of pain when walking.    Currently in Pain? Yes   Pain Score 2    Pain Location Ankle   Pain Orientation Left   Pain Descriptors / Indicators Aching   Pain Type Chronic pain   Pain Onset More than a month ago   Pain Frequency Constant      TREATMENT: Manual therapy: STM with patient positioned in sitting with focus on addressing increased pain and spasms along 4th-5th metatarsal and plantar fascia & mobilizations along 3-4-5th digits grade IV to improve joint movement and decrease pain. A->P grade IV mobilizations to TC joint for 3 x 30secs   Therapeutic exercise: Amb with focus on improving heel strike bilaterally - 2 x 66ft pt demonstrates slow  movement throughout cycle to maintain balance Side stepping monster walks with GTB - 2 x 5ft B  Semi tandem ambulation with frequent side stepping to maintain balance - 2 x 80ft Dorsiflexion/plantarflexion on green dynadisc - forward/backward x2 min B Inversion/Eversion on green dynadisc -x3min B      PT Education - 06/18/16 1342    Education provided Yes   Education Details Educated to check surrounding when ambulating   Person(s) Educated Patient   Methods Explanation   Comprehension Verbalized understanding             PT Long Term Goals - 06/09/16 1654      PT LONG TERM GOAL #1   Title Patient will improve BERG to 56/56 to demonstrate significant improvement in static balance and improved ability to donn pants in standing   Baseline BERG 50/56; 06/09/16: 53/56   Time 6   Period Weeks    Status On-going     PT LONG TERM GOAL #2   Title Patient will improve DGI to 24/24 to demonstrate significant improvement in dynamic balance and ability to walk without falling   Baseline DGI: 19/24; 06/09/16: 22/24   Time 6   Period Weeks   Status On-going     PT LONG TERM GOAL #3   Title Patient will improve L ankle pain to the worse pain being 2/10 on the VAS to demonstrate significant improvement in pain.    Baseline VAS: 7/10; 06/09/16: 3/10   Time 6   Period Weeks   Status On-going     PT LONG TERM GOAL #4   Title Patient will be independent with HEP to continues benefits of therapy after discharge.    Baseline dependent with exercise progression and performacne; 06/09/16: Moderate cueing required for proper technique   Time 6   Period Weeks   Status On-going               Plan - 06/18/16 1356    Clinical Impression Statement Educated patient on checking surroundings when ambulating to decrease fall risk. Patient demonstrates increased postural sway when ambulating indicating decreased dynamic gait and increased ankle dysfunction. Patient will benefit from further skilled therapy to return to prior level of function.     Rehab Potential Fair   Clinical Impairments Affecting Rehab Potential (+) highly motivated, family support (-) chronicity of impairment, previous TBI   PT Frequency 2x / week   PT Duration 6 weeks   PT Treatment/Interventions Manual techniques;Neuromuscular re-education;Gait training;Stair training;Therapeutic activities;Therapeutic exercise;Balance training;Patient/family education;Functional mobility training;Moist Heat;Ultrasound;Electrical Stimulation   PT Next Visit Plan Progress strengthening,  balancing, further decrease in ankle pain   PT Home Exercise Plan Single leg stance, Tandem stance   Consulted and Agree with Plan of Care Patient      Patient will benefit from skilled therapeutic intervention in order to improve the following deficits  and impairments:  Abnormal gait, Decreased balance, Pain, Impaired sensation, Decreased mobility, Increased muscle spasms, Decreased endurance, Decreased strength, Decreased range of motion, Difficulty walking, Decreased activity tolerance  Visit Diagnosis: Difficulty in walking, not elsewhere classified  Unsteadiness on feet  Other lack of coordination     Problem List There are no active problems to display for this patient.   Myrene Galas, PT DPT 06/18/2016, 2:14 PM  [facility] MAIN [facility] SERVICES [address] [place], [place], [address] Phone: [phone]   Fax:  [phone]  Name: Blake [name] MRN: [id number] Date of Birth: 03/29/1951

## 2016-06-24 ENCOUNTER — Ambulatory Visit: Payer: Medicare Other | Attending: Family Medicine

## 2016-06-24 DIAGNOSIS — R2681 Unsteadiness on feet: Secondary | ICD-10-CM | POA: Insufficient documentation

## 2016-06-24 DIAGNOSIS — R278 Other lack of coordination: Secondary | ICD-10-CM | POA: Diagnosis present

## 2016-06-24 DIAGNOSIS — R262 Difficulty in walking, not elsewhere classified: Secondary | ICD-10-CM | POA: Diagnosis not present

## 2016-06-24 DIAGNOSIS — M6281 Muscle weakness (generalized): Secondary | ICD-10-CM | POA: Diagnosis present

## 2016-06-24 NOTE — Therapy (Signed)
[place] [facility] MAIN [facility] SERVICES [address] [place], [place], [address] Phone: [phone]   Fax:  [phone]  Physical Therapy Treatment  Patient Details  Name: Blake [name] MRN: [id number] Date of Birth: 1950/09/07 Referring Provider: Dorothey Baseman MD  Encounter Date: 06/24/2016      PT End of Session - 06/24/16 1251    Visit Number 14   Number of Visits 20   Date for PT Re-Evaluation 07/07/16   Authorization Type 4/10 G code   PT Start Time 1120   PT Stop Time 1202   PT Time Calculation (min) 42 min   Equipment Utilized During Treatment Gait belt   Activity Tolerance Patient tolerated treatment well   Behavior During Therapy [facility] for tasks assessed/performed      Past Medical History:  Diagnosis Date  . Asperger's syndrome    (per wife)  . Coronary artery disease   . Dementia   . Depression   . GERD (gastroesophageal reflux disease)   . Headache    stress  . Hearing loss   . Hyperlipidemia   . Hypertension   . 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 Myocardial infarction 08/2013  . OCD (obsessive compulsive disorder)   . Prostatic hypertrophy   . Raynaud's disease   . Repeated falls    weak left ankle  . Scoliosis   . Stroke (HCC) 2/16   "light"  . Synovitis    knees, ankles    Past Surgical History:  Procedure Laterality Date  . BACK SURGERY  2011   rods and screws  . COLONOSCOPY  2015  . CYSTOSCOPY  1984  . EYE SURGERY Bilateral 2005,2006,2009   detached retina, cataract  . FOOT ARTHRODESIS Left 05/30/2015   Procedure: ARTHRODESIS FOOT STJ LT FOOT;  Surgeon: [name], DPM;  Location: [facility];  Service: Podiatry;  Laterality: Left;  WITH POPLITEAL BLOCK  . FOOT SURGERY Left   . HERNIA REPAIR Right 1969   inguinal  . SHOULDER SURGERY Right 2004  . skull surgery    . TOE SURGERY Right 2009  .  TONSILLECTOMY  2008    There were no vitals filed for this visit.      Subjective Assessment - 06/24/16 1124    Subjective Patient reports no major changes since the previous visit. Patient reports the same amount of pain when walking with the ankle brace on or off.    Pertinent History Hx of TBI (2011); L ankle fx 1 year prior   Limitations Standing;Walking   How long can you walk comfortably? 2 miles    Patient Stated Goals Reduction of pain when walking.    Currently in Pain? Yes   Pain Score 1    Pain Location Ankle   Pain Orientation Left   Pain Descriptors / Indicators Aching   Pain Type Chronic pain   Pain Onset More than a month ago      TREATMENT: Manual therapy: STM with patient positioned in sitting with focus on addressing increased pain and spasms along 4th-5thmetatarsal and plantar fascia & mobilizations along 3-4-5thdigits grade Iv, focus on the lateral  to improve joint movement and decrease pain. A->p; P->A grade IV mobilizations to TC joint for 3 x 30secs.   Therapeutic exercise: Amb with focus on improving heel strike bilaterally - 2 x 21ft pt  demonstrates slow movement throughout cycle to maintain balance Resisted Dorsiflexion -- 5 sec x15 Elliptical in standing -- 2 min  Nustep level 6 with cueing on speed - 2 min         PT Education - 06/24/16 1250    Education provided Yes   Education Details Educated to continue wearing his ankle brace when ambulating   Person(s) Educated Patient   Methods Explanation   Comprehension Verbalized understanding             PT Long Term Goals - 06/09/16 1654      PT LONG TERM GOAL #1   Title Patient will improve BERG to 56/56 to demonstrate significant improvement in static balance and improved ability to donn pants in standing   Baseline BERG 50/56; 06/09/16: 53/56   Time 6   Period Weeks   Status On-going     PT LONG TERM GOAL #2   Title Patient will improve DGI to 24/24 to demonstrate significant  improvement in dynamic balance and ability to walk without falling   Baseline DGI: 19/24; 06/09/16: 22/24   Time 6   Period Weeks   Status On-going     PT LONG TERM GOAL #3   Title Patient will improve L ankle pain to the worse pain being 2/10 on the VAS to demonstrate significant improvement in pain.    Baseline VAS: 7/10; 06/09/16: 3/10   Time 6   Period Weeks   Status On-going     PT LONG TERM GOAL #4   Title Patient will be independent with HEP to continues benefits of therapy after discharge.    Baseline dependent with exercise progression and performacne; 06/09/16: Moderate cueing required for proper technique   Time 6   Period Weeks   Status On-going               Plan - 06/24/16 1251    Clinical Impression Statement Patient demonstrates increased muscle spasms and joint limitations along his 4th and 5th digits most notably when ambulating. Patient demonstrates difficulty with elipital exercise as indicated by increased pain and instructed patient to use seated recumbent bike or Nustep for HEP at the [facility]. Patient will benefit from further skilled therapy to return to prior level of function.    Rehab Potential Fair   Clinical Impairments Affecting Rehab Potential (+) highly motivated, family support (-) chronicity of impairment, previous TBI   PT Frequency 2x / week   PT Duration 6 weeks   PT Treatment/Interventions Manual techniques;Neuromuscular re-education;Gait training;Stair training;Therapeutic activities;Therapeutic exercise;Balance training;Patient/family education;Functional mobility training;Moist Heat;Ultrasound;Electrical Stimulation   PT Next Visit Plan Progress strengthening,  balancing, further decrease in ankle pain   PT Home Exercise Plan Single leg stance, Tandem stance   Consulted and Agree with Plan of Care Patient      Patient will benefit from skilled therapeutic intervention in order to improve the following deficits and impairments:  Abnormal  gait, Decreased balance, Pain, Impaired sensation, Decreased mobility, Increased muscle spasms, Decreased endurance, Decreased strength, Decreased range of motion, Difficulty walking, Decreased activity tolerance  Visit Diagnosis: Difficulty in walking, not elsewhere classified  Unsteadiness on feet     Problem List There are no active problems to display for this patient.   Myrene Galas, PT DPT 06/24/2016, 1:23 PM  [place] [facility] MAIN [facility] SERVICES [address] [place], [place], [address] Phone: [phone]   Fax:  [phone]  Name: OSBON SVAY MRN: [id number] Date of Birth:  03/11/1951   

## 2016-06-26 ENCOUNTER — Ambulatory Visit: Payer: Medicare Other

## 2016-06-26 DIAGNOSIS — R262 Difficulty in walking, not elsewhere classified: Secondary | ICD-10-CM

## 2016-06-26 DIAGNOSIS — R2681 Unsteadiness on feet: Secondary | ICD-10-CM

## 2016-06-26 NOTE — Therapy (Signed)
[place] [facility] MAIN [facility] SERVICES [address] [place], [place], [address] Phone: [phone]   Fax:  [phone]  Physical Therapy Treatment  Patient Details  Name: Blake [name] MRN: [id number] Date of Birth: 1951/01/01 Referring Provider: Dorothey Baseman MD  Encounter Date: 06/26/2016      PT End of Session - 06/26/16 1243    Visit Number 15   Number of Visits 20   Date for PT Re-Evaluation 07/07/16   Authorization Type 5/10 G code   PT Start Time 1120   PT Stop Time 1200   PT Time Calculation (min) 40 min   Equipment Utilized During Treatment Gait belt   Activity Tolerance Patient tolerated treatment well   Behavior During Therapy [facility] for tasks assessed/performed      Past Medical History:  Diagnosis Date  . Asperger's syndrome    (per wife)  . Coronary artery disease   . Dementia   . Depression   . GERD (gastroesophageal reflux disease)   . Headache    stress  . Hearing loss   . Hyperlipidemia   . Hypertension   . 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 Myocardial infarction 08/2013  . OCD (obsessive compulsive disorder)   . Prostatic hypertrophy   . Raynaud's disease   . Repeated falls    weak left ankle  . Scoliosis   . Stroke (HCC) 2/16   "light"  . Synovitis    knees, ankles    Past Surgical History:  Procedure Laterality Date  . BACK SURGERY  2011   rods and screws  . COLONOSCOPY  2015  . CYSTOSCOPY  1984  . EYE SURGERY Bilateral 2005,2006,2009   detached retina, cataract  . FOOT ARTHRODESIS Left 05/30/2015   Procedure: ARTHRODESIS FOOT STJ LT FOOT;  Surgeon: [name], DPM;  Location: [facility];  Service: Podiatry;  Laterality: Left;  WITH POPLITEAL BLOCK  . FOOT SURGERY Left   . HERNIA REPAIR Right 1969   inguinal  . SHOULDER SURGERY Right 2004  . skull surgery    . TOE SURGERY Right 2009  .  TONSILLECTOMY  2008    There were no vitals filed for this visit.      Subjective Assessment - 06/26/16 1241    Subjective Patient reports he had less pain when walking to the mailbox this morning. Patient states no major changes otherwise.    Pertinent History Hx of TBI (2011); L ankle fx 1 year prior   Limitations Standing;Walking   How long can you walk comfortably? 2 miles    Patient Stated Goals Reduction of pain when walking.    Currently in Pain? Yes   Pain Score 2    Pain Location Ankle   Pain Orientation Left   Pain Descriptors / Indicators Aching   Pain Type Chronic pain   Pain Onset More than a month ago   Pain Frequency Constant      TREATMENT: Manual therapy: STM with patient positioned in sitting with focus on addressing increased pain and spasms along 4th-5th metatarsal and plantar fascia & mobilizations along 3-4-5th digits grade Iv, focus on the lateral  to improve joint movement and decrease pain. A->p; P->A grade IV mobilizations to TC joint for 3 x 30secs.    Therapeutic exercise: Amb with focus on improving heel strike bilaterally - x 107ft  pt demonstrates slow movement throughout cycle to maintain balance Resisted toe flexion in long sitting against manual resistance - 2 x 10  Resisted toe flexion in long sitting against GTB - x 20  Leg Press for quadriceps and foot strengthening - 120# x 20 (Performed in plantarflexion to improve mobility and stability) Recumbent Bike -- 5 min with level 15 resistance and cueing on speed and performance.        PT Education - 06/26/16 1242    Education provided Yes   Education Details Educated on form/technique throguhout treatment session   Person(s) Educated Patient   Methods Explanation;Demonstration   Comprehension Verbalized understanding;Returned demonstration             PT Long Term Goals - 06/09/16 1654      PT LONG TERM GOAL #1   Title Patient will improve BERG to 56/56 to demonstrate significant  improvement in static balance and improved ability to donn pants in standing   Baseline BERG 50/56; 06/09/16: 53/56   Time 6   Period Weeks   Status On-going     PT LONG TERM GOAL #2   Title Patient will improve DGI to 24/24 to demonstrate significant improvement in dynamic balance and ability to walk without falling   Baseline DGI: 19/24; 06/09/16: 22/24   Time 6   Period Weeks   Status On-going     PT LONG TERM GOAL #3   Title Patient will improve L ankle pain to the worse pain being 2/10 on the VAS to demonstrate significant improvement in pain.    Baseline VAS: 7/10; 06/09/16: 3/10   Time 6   Period Weeks   Status On-going     PT LONG TERM GOAL #4   Title Patient will be independent with HEP to continues benefits of therapy after discharge.    Baseline dependent with exercise progression and performacne; 06/09/16: Moderate cueing required for proper technique   Time 6   Period Weeks   Status On-going               Plan - 06/26/16 1248    Clinical Impression Statement Patient demonstrates increased tenderness along plantar aspect of his foot most notebaly along the 4th and 5th digit. Patient's pain decreased after performing toe flexion and manual therapy indicating improved muscular spasms and pain. Patient will benefit from further skilled therapy to return to prior level of function.    Rehab Potential Fair   Clinical Impairments Affecting Rehab Potential (+) highly motivated, family support (-) chronicity of impairment, previous TBI   PT Frequency 2x / week   PT Duration 6 weeks   PT Treatment/Interventions Manual techniques;Neuromuscular re-education;Gait training;Stair training;Therapeutic activities;Therapeutic exercise;Balance training;Patient/family education;Functional mobility training;Moist Heat;Ultrasound;Electrical Stimulation   PT Next Visit Plan Progress strengthening,  balancing, further decrease in ankle pain   PT Home Exercise Plan Single leg stance,  Tandem stance   Consulted and Agree with Plan of Care Patient      Patient will benefit from skilled therapeutic intervention in order to improve the following deficits and impairments:  Abnormal gait, Decreased balance, Pain, Impaired sensation, Decreased mobility, Increased muscle spasms, Decreased endurance, Decreased strength, Decreased range of motion, Difficulty walking, Decreased activity tolerance  Visit Diagnosis: Difficulty in walking, not elsewhere classified  Unsteadiness on feet     Problem List There are no active problems to display for this patient.   Myrene Galas, PT DPT 06/26/2016, 12:51 PM  [place] [facility] MAIN REHAB SERVICES  [address] [place], [place], [address] Phone: [phone]   Fax:  [phone]  Name: Blake [name] MRN: [id number] Date of Birth: December 28, 1950

## 2016-07-01 ENCOUNTER — Ambulatory Visit: Payer: Medicare Other

## 2016-07-01 DIAGNOSIS — R262 Difficulty in walking, not elsewhere classified: Secondary | ICD-10-CM

## 2016-07-01 DIAGNOSIS — R278 Other lack of coordination: Secondary | ICD-10-CM

## 2016-07-01 DIAGNOSIS — R2681 Unsteadiness on feet: Secondary | ICD-10-CM

## 2016-07-01 NOTE — Therapy (Signed)
[place] [facility] MAIN [facility] SERVICES [address] [place], [place], [address] Phone: [phone]   Fax:  [phone]  Physical Therapy Treatment  Patient Details  Name: Blake [name] MRN: [id number] Date of Birth: 05/22/1950 Referring Provider: Dorothey Baseman MD  Encounter Date: 07/01/2016      PT End of Session - 07/01/16 1216    Visit Number 16   Number of Visits 20   Date for PT Re-Evaluation 07/07/16   Authorization Type 6/10 G code   PT Start Time 1115   PT Stop Time 1200   PT Time Calculation (min) 45 min   Equipment Utilized During Treatment Gait belt   Activity Tolerance Patient tolerated treatment well   Behavior During Therapy [facility] for tasks assessed/performed      Past Medical History:  Diagnosis Date  . Asperger's syndrome    (per wife)  . Coronary artery disease   . Dementia   . Depression   . GERD (gastroesophageal reflux disease)   . Headache    stress  . Hearing loss   . Hyperlipidemia   . Hypertension   . 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 Myocardial infarction 08/2013  . OCD (obsessive compulsive disorder)   . Prostatic hypertrophy   . Raynaud's disease   . Repeated falls    weak left ankle  . Scoliosis   . Stroke (HCC) 2/16   "light"  . Synovitis    knees, ankles    Past Surgical History:  Procedure Laterality Date  . BACK SURGERY  2011   rods and screws  . COLONOSCOPY  2015  . CYSTOSCOPY  1984  . EYE SURGERY Bilateral 2005,2006,2009   detached retina, cataract  . FOOT ARTHRODESIS Left 05/30/2015   Procedure: ARTHRODESIS FOOT STJ LT FOOT;  Surgeon: [name], DPM;  Location: [facility];  Service: Podiatry;  Laterality: Left;  WITH POPLITEAL BLOCK  . FOOT SURGERY Left   . HERNIA REPAIR Right 1969   inguinal  . SHOULDER SURGERY Right 2004  . skull surgery    . TOE SURGERY Right 2009  .  TONSILLECTOMY  2008    There were no vitals filed for this visit.      Subjective Assessment - 07/01/16 1214    Subjective Patient demonstrates decreased pain after performing mobility exercises in the morning. Patient states the foot feels about the same overall.    Pertinent History Hx of TBI (2011); L ankle fx 1 year prior   Limitations Standing;Walking   How long can you walk comfortably? 2 miles    Patient Stated Goals Reduction of pain when walking.    Currently in Pain? Yes   Pain Score 3    Pain Location Ankle   Pain Orientation Left   Pain Descriptors / Indicators Aching   Pain Type Chronic pain   Pain Onset More than a month ago      TREATMENT: Manual therapy: STM with patient positioned in sitting with focus on addressing increased pain and spasms along 4th-5th metatarsal fascia/muscular and plantar fascia & mobilizations along 3-4-5th digits grade Iv, focus on the lateral  to improve joint movement and decrease pain. A->p; P->A grade IV mobilizations to TC joint for 3 x 30secs.    Therapeutic exercise: Amb with focus on improving heel strike and taking of larger steps bilaterally -  x 86ft pt demonstrates slow movement throughout cycle to maintain balance Resisted toe flexion in long sitting against manual resistance - 2 x 10  Resisted plantarflexion, inversion, eversion in long sitting - 2 x 10  Leg Press for calve and foot strengthening - 70# x on L LE only to decrease pain and spasms Heel raises on half blue foam in standing - with B LE's x20 (Patient demonstrates decreased pain after performance of exercise       PT Education - 07/01/16 1215    Education provided Yes   Education Details Educated on heel strike and ambulation for exercise   Person(s) Educated Patient   Methods Demonstration;Explanation   Comprehension Verbalized understanding;Returned demonstration             PT Long Term Goals - 06/09/16 1654      PT LONG TERM GOAL #1   Title  Patient will improve BERG to 56/56 to demonstrate significant improvement in static balance and improved ability to donn pants in standing   Baseline BERG 50/56; 06/09/16: 53/56   Time 6   Period Weeks   Status On-going     PT LONG TERM GOAL #2   Title Patient will improve DGI to 24/24 to demonstrate significant improvement in dynamic balance and ability to walk without falling   Baseline DGI: 19/24; 06/09/16: 22/24   Time 6   Period Weeks   Status On-going     PT LONG TERM GOAL #3   Title Patient will improve L ankle pain to the worse pain being 2/10 on the VAS to demonstrate significant improvement in pain.    Baseline VAS: 7/10; 06/09/16: 3/10   Time 6   Period Weeks   Status On-going     PT LONG TERM GOAL #4   Title Patient will be independent with HEP to continues benefits of therapy after discharge.    Baseline dependent with exercise progression and performacne; 06/09/16: Moderate cueing required for proper technique   Time 6   Period Weeks   Status On-going               Plan - 07/01/16 1216    Clinical Impression Statement Patient demonstrates increased tenderness and pain along extensor bundle in L ankle and along interphalgeal tissue between 4th and 5th digit. Patient demonstrates decreased pain in affected area after mobility based exercises indicating improved ROM, coordination, and decreased spasms. Patient continues to require frequent cueing on foot/ankle positioning with ambulation demonstrating decreased carryover between sessions. Patient reports he will be prepared for discharge [place] and will benefit from further skilled therapy to return to prior level of function.    Rehab Potential Fair   Clinical Impairments Affecting Rehab Potential (+) highly motivated, family support (-) chronicity of impairment, previous TBI   PT Frequency 2x / week   PT Duration 6 weeks   PT Treatment/Interventions Manual techniques;Neuromuscular re-education;Gait training;Stair  training;Therapeutic activities;Therapeutic exercise;Balance training;Patient/family education;Functional mobility training;Moist Heat;Ultrasound;Electrical Stimulation   PT Next Visit Plan Progress strengthening,  balancing, further decrease in ankle pain   PT Home Exercise Plan Single leg stance, Tandem stance   Consulted and Agree with Plan of Care Patient      Patient will benefit from skilled therapeutic intervention in order to improve the following deficits and impairments:  Abnormal gait, Decreased balance, Pain, Impaired sensation, Decreased mobility, Increased muscle spasms, Decreased endurance, Decreased strength, Decreased range of motion, Difficulty walking, Decreased activity tolerance  Visit Diagnosis: Unsteadiness on feet  Difficulty  in walking, not elsewhere classified  Other lack of coordination     Problem List There are no active problems to display for this patient.   Myrene Galas, PT DPT 07/01/2016, 12:19 PM  [place] [facility] MAIN [facility] SERVICES [address] [place], [place], [address] Phone: [phone]   Fax:  [phone]  Name: AMALIO LOE MRN: [id number] Date of Birth: October 06, 1950

## 2016-07-03 ENCOUNTER — Ambulatory Visit: Payer: Medicare Other

## 2016-07-03 DIAGNOSIS — R2681 Unsteadiness on feet: Secondary | ICD-10-CM

## 2016-07-03 DIAGNOSIS — M6281 Muscle weakness (generalized): Secondary | ICD-10-CM

## 2016-07-03 DIAGNOSIS — R262 Difficulty in walking, not elsewhere classified: Secondary | ICD-10-CM

## 2016-07-03 DIAGNOSIS — R278 Other lack of coordination: Secondary | ICD-10-CM

## 2016-07-03 NOTE — Therapy (Addendum)
[place] MAIN [facility] SERVICES [place], [place], [address] Phone: [phone]   Fax:  [phone]  Physical Therapy Treatment/Discharge Summary  Patient Details  Name: Blake [name] MRN: [id number] Date of Birth: 26-May-1950 Referring Provider: Juluis Pitch MD  Encounter Date: 07/03/2016  Patient has attended 17 out of 17 visits and have met or partially met all long term goals. Patient is to be D/C'ed from PT.      PT End of Session - 07/03/16 1251    Visit Number 17   Number of Visits 20   Date for PT Re-Evaluation 07/07/16   Authorization Type 7/10 G code   PT Start Time 1115   PT Stop Time 1200   PT Time Calculation (min) 45 min   Equipment Utilized During Treatment Gait belt   Activity Tolerance Patient tolerated treatment well   Behavior During Therapy WFL for tasks assessed/performed      Past Medical History:  Diagnosis Date  . Asperger's syndrome    (per wife)  . Coronary artery disease   . Dementia   . Depression   . GERD (gastroesophageal reflux disease)   . Headache    stress  . Hearing loss   . Hyperlipidemia   . Hypertension   . 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 Myocardial infarction 08/2013  . OCD (obsessive compulsive disorder)   . Prostatic hypertrophy   . Raynaud's disease   . Repeated falls    weak left ankle  . Scoliosis   . Stroke ([place]) 2/16   "light"  . Synovitis    knees, ankles    Past Surgical History:  Procedure Laterality Date  . BACK SURGERY  2011   rods and screws  . COLONOSCOPY  2015  . CYSTOSCOPY  1984  . EYE SURGERY Bilateral 2005,2006,2009   detached retina, cataract  . FOOT ARTHRODESIS Left 05/30/2015   Procedure: ARTHRODESIS FOOT STJ LT FOOT;  Surgeon: Samara Deist, DPM;  Location: [facility];  Service: Podiatry;  Laterality: Left;  WITH POPLITEAL BLOCK  . FOOT  SURGERY Left   . HERNIA REPAIR Right 1969   inguinal  . SHOULDER SURGERY Right 2004  . skull surgery    . TOE SURGERY Right 2009  . TONSILLECTOMY  2008    There were no vitals filed for this visit.      Subjective Assessment - 07/03/16 1249    Subjective Patient demonstrates no major changes in the foot/ankle since the previous visit. Patient reports the worse pain in his ankle is around a 3/10 during the past week.    Pertinent History Hx of TBI (2011); L ankle fx 1 year prior   Limitations Standing;Walking   How long can you walk comfortably? 2 miles    Patient Stated Goals Reduction of pain when walking.    Currently in Pain? Yes   Pain Score 3    Pain Location Ankle   Pain Orientation Left   Pain Descriptors / Indicators Aching   Pain Type Chronic pain   Pain Onset More than a month ago            [facility] PT Assessment - 07/03/16 1140      Berg Balance Test   Sit to Stand Able to stand without using hands and stabilize independently   Standing Unsupported Able  to stand safely 2 minutes   Sitting with Back Unsupported but Feet Supported on Floor or Stool Able to sit safely and securely 2 minutes   Stand to Sit Sits safely with minimal use of hands   Transfers Able to transfer safely, minor use of hands   Standing Unsupported with Eyes Closed Able to stand 10 seconds safely   Standing Ubsupported with Feet Together Able to place feet together independently and stand 1 minute safely   From Standing, Reach Forward with Outstretched Arm Can reach confidently >25 cm (10")   From Standing Position, Pick up Object from Floor Able to pick up shoe safely and easily   From Standing Position, Turn to Look Behind Over each Shoulder Looks behind from both sides and weight shifts well   Turn 360 Degrees Able to turn 360 degrees safely in 4 seconds or less   Standing Unsupported, Alternately Place Feet on Step/Stool Able to stand independently and safely and complete 8 steps in 20  seconds   Standing Unsupported, One Foot in Front Able to plae foot ahead of the other independently and hold 30 seconds   Standing on One Leg Able to lift leg independently and hold 5-10 seconds   Total Score 54     Dynamic Gait Index   Level Surface Normal   Change in Gait Speed Normal   Gait with Horizontal Head Turns Normal   Gait with Vertical Head Turns Normal   Gait and Pivot Turn Normal   Step Over Obstacle Normal   Step Around Obstacles Normal   Steps Normal   Total Score 24       TREATMENT: Manual therapy: STM with patient positioned in sitting with focus on addressing increased pain and spasms along 4th-5th metatarsal fascia/muscular and plantar fascia  focus on the lateral aspect  to improve joint movement and decrease pain   Therapeutic exercise: Heel raises with UE support - 2 x 15  Amb with focus on improving heel strike and taking of larger steps bilaterally - x 78f pt demonstrates slow movement throughout cycle to maintain balance Resisted toe flexion in long sitting against manual resistance - 2 x 10  Resisted plantarflexion, inversion, eversion in long sitting - 2 x 10  Tandem stance B - 2 x 30sec Stairs performance - x 5 (4 steps up/down) Single leg stance 2 x 20sec B       PT Education - 07/03/16 1250    Education provided Yes   Education Details HEP: heel raises   Person(s) Educated Patient   Methods Explanation;Demonstration   Comprehension Verbalized understanding;Returned demonstration             PT Long Term Goals - 07/03/16 1256      PT LONG TERM GOAL #1   Title Patient will improve BERG to 56/56 to demonstrate significant improvement in static balance and improved ability to donn pants in standing   Baseline BERG 50/56; 06/09/16: 53/56 07/03/16: 54/56   Time 6   Period Weeks   Status Partially Met     PT LONG TERM GOAL #2   Title Patient will improve DGI to 24/24 to demonstrate significant improvement in dynamic balance and ability  to walk without falling   Baseline DGI: 19/24; 06/09/16: 22/24 07/03/16: 24/24   Time 6   Period Weeks   Status Achieved     PT LONG TERM GOAL #3   Title Patient will improve L ankle pain to the worse pain being 2/10 on  the VAS to demonstrate significant improvement in pain.    Baseline VAS: 7/10; 07/03/16: 3/10   Time 6   Period Weeks   Status Partially Met     PT LONG TERM GOAL #4   Title Patient will be independent with HEP to continues benefits of therapy after discharge.    Baseline dependent with exercise progression and performacne; 06/09/16: Moderate cueing required for proper technique 07/03/16: minimal cueing but independent with HEP   Time 6   Period Weeks   Status Achieved               Plan - 07/03/16 1251    Clinical Impression Statement Patient demonstrates improvement in ithe DGI and BERG balance scores today indicating functional improvement in the ankle and improvement in static/dynamic balance. Patient reports his ankle has improved considerably since the beginning of therapy and is prepared for D/C. Patient is to be D/C'ed from physical therapy.    Rehab Potential Fair   Clinical Impairments Affecting Rehab Potential (+) highly motivated, family support (-) chronicity of impairment, previous TBI   PT Frequency 2x / week   PT Duration 6 weeks   PT Treatment/Interventions Manual techniques;Neuromuscular re-education;Gait training;Stair training;Therapeutic activities;Therapeutic exercise;Balance training;Patient/family education;Functional mobility training;Moist Heat;Ultrasound;Electrical Stimulation   PT Next Visit Plan D/C   PT Home Exercise Plan Single leg stance, Tandem stance   Consulted and Agree with Plan of Care Patient      Patient will benefit from skilled therapeutic intervention in order to improve the following deficits and impairments:  Abnormal gait, Decreased balance, Pain, Impaired sensation, Decreased mobility, Increased muscle spasms,  Decreased endurance, Decreased strength, Decreased range of motion, Difficulty walking, Decreased activity tolerance  Visit Diagnosis: Unsteadiness on feet  Difficulty in walking, not elsewhere classified  Muscle weakness (generalized)  Other lack of coordination     Problem List There are no active problems to display for this patient.   Blythe Stanford, PT DPT 07/03/2016, 1:30 PM  [place] MAIN [facility] SERVICES [address] [place], [place], [address] Phone: [phone]   Fax:  [phone]  Name: Blake [name] MRN: [id number] Date of Birth: 08-20-1950

## 2017-02-09 ENCOUNTER — Telehealth: Payer: Self-pay | Admitting: Urology

## 2017-02-09 NOTE — Telephone Encounter (Signed)
Made in error

## 2017-02-12 ENCOUNTER — Encounter: Payer: Self-pay | Admitting: Urology

## 2017-02-12 ENCOUNTER — Ambulatory Visit (INDEPENDENT_AMBULATORY_CARE_PROVIDER_SITE_OTHER): Payer: Medicare Other | Admitting: Urology

## 2017-02-12 VITALS — BP 128/79 | HR 88 | Ht 67.0 in | Wt 194.4 lb

## 2017-02-12 DIAGNOSIS — E291 Testicular hypofunction: Secondary | ICD-10-CM

## 2017-02-12 DIAGNOSIS — N138 Other obstructive and reflux uropathy: Secondary | ICD-10-CM

## 2017-02-12 DIAGNOSIS — N411 Chronic prostatitis: Secondary | ICD-10-CM | POA: Diagnosis not present

## 2017-02-12 DIAGNOSIS — N401 Enlarged prostate with lower urinary tract symptoms: Secondary | ICD-10-CM

## 2017-02-12 MED ORDER — TERAZOSIN HCL 1 MG PO CAPS: 1.0000 mg | ORAL_CAPSULE | Freq: Every day | ORAL | 3 refills | Status: DC

## 2017-02-12 NOTE — Progress Notes (Signed)
02/12/2017 4:55 PM   Blake SangJohn E Crescenzo [name] 03/21/1951 [id number]  Referring provider: Dorothey BasemanBronstein, David, MD [name] [place], [place] [id number]  Chief Complaint  Patient presents with  . Follow-up    HPI: He is followed for hypogonadism, BPH and chronic prostatitis/chronic pelvic pain syndrome.  At his last visit July 2018 he was complaining of decreased libido however his testosterone level was greater than 2000.  The testosterone was briefly discontinued and restarted at a lower dose.  His last level in September 2018 was 324 ng/dL.  He has had worsening libido.  He also complains of painful ejaculation however has been off his Terazosin for at least 3 weeks and the combination of this medication along with tamsulosin seems to control this symptom.   PMH: Past Medical History:  Diagnosis Date  . 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 Asperger's syndrome    (per wife)  . 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 CAD (coronary artery disease) 09/21/2013  . 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 Chronic left shoulder pain 04/03/2016  . 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 Coronary artery disease   . Dementia   . Depression   . Disorder of male genital organs 07/11/2013  . Encounter for long-term (current) use of medications 11/21/2014  . Erectile dysfunction 07/11/2013  . Executive function deficit 03/06/2016  . GERD (gastroesophageal reflux disease)   . Headache    stress  . Hearing loss   . Hyperlipidemia   . Hypertension   . Hypogonadism, 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  . 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  . 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 Major depression, recurrent, chronic (HCC) 03/06/2016  . 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 Mixed hyperlipidemia 02/02/2014  . Myocardial infarction (HCC) 08/2013  . OCD (obsessive compulsive disorder)    . 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  . Other specified disorder of male genital organs(608.89) 07/11/2013  . Prostatic hypertrophy   . Pseudophakia of both eyes 04/20/2015  . Raynaud's disease   . Reduced libido 07/11/2013  . Repeated falls    weak left ankle  . Scoliosis   . Stroke (HCC) 2/16   "light"  . Synovitis    knees, ankles    Surgical History: Past Surgical History:  Procedure Laterality Date  . BACK SURGERY  2011   rods and screws  . COLONOSCOPY  2015  . CYSTOSCOPY  1984  . EYE SURGERY Bilateral 2005,2006,2009   detached retina, cataract  . FOOT ARTHRODESIS Left 05/30/2015   Procedure: ARTHRODESIS FOOT STJ LT FOOT;  Surgeon: Gwyneth RevelsJustin Fowler, DPM;  Location[facility];  Service: Podiatry;  Laterality: Left;  WITH POPLITEAL BLOCK  . FOOT SURGERY Left   . HERNIA REPAIR Right 1969   inguinal  . SHOULDER SURGERY Right 2004  . skull surgery    . TOE SURGERY Right 2009  . TONSILLECTOMY  2008    Home Medications:  Allergies as of 02/12/2017   No Known Allergies     Medication List       Accurate as of 02/12/17  4:55 PM. Always use your most recent med list.          aspirin 81 MG tablet Take 81 mg by mouth daily.   calcium-vitamin D 500-200 MG-UNIT tablet Commonly known as:  OSCAL WITH D Take 1 tablet  by mouth.   clindamycin 1 % lotion Commonly known as:  CLEOCIN T Apply topically 2 (two) times daily as needed.   clopidogrel 75 MG tablet Commonly known as:  PLAVIX Take 75 mg by mouth daily.   dexlansoprazole 60 MG capsule Commonly known as:  DEXILANT Take 60 mg by mouth daily.   donepezil 10 MG tablet Commonly known as:  ARICEPT Take 10 mg by mouth at bedtime.   DULOXETINE HCL PO Take 60 mg by mouth daily.   FIBER PO Take 1 tablet by mouth 2 (two) times daily.   fluticasone 27.5 MCG/SPRAY nasal spray Commonly known as:  VERAMYST Place 1 spray into the nose daily.   folic acid 1 MG tablet Commonly known as:  FOLVITE Take 1  mg by mouth daily.   lisinopril 40 MG tablet Commonly known as:  PRINIVIL,ZESTRIL Take 1 tablet (40 mg total) by mouth daily.   methotrexate 2.5 MG tablet Commonly known as:  RHEUMATREX Take 20 mg by mouth once a week.   methylphenidate 10 MG tablet Commonly known as:  RITALIN Take 10 mg by mouth 2 (two) times daily. 27 mg AM, 10 mg afternoon   prednisoLONE acetate 1 % ophthalmic suspension Commonly known as:  PRED FORTE Place 1 drop into the right eye 4 (four) times daily.   PROBIOTIC DAILY PO Take by mouth daily.   RABEprazole 20 MG tablet Commonly known as:  ACIPHEX Take 20 mg by mouth daily.   tadalafil 20 MG tablet Commonly known as:  CIALIS Take 20 mg by mouth daily as needed for erectile dysfunction.   tamsulosin 0.4 MG Caps capsule Commonly known as:  FLOMAX Take 0.4 mg by mouth daily.   terazosin 1 MG capsule Commonly known as:  HYTRIN Take 1 capsule (1 mg total) by mouth at bedtime.   testosterone cypionate 100 MG/ML injection Commonly known as:  DEPOTESTOTERONE CYPIONATE Inject into the muscle every 14 (fourteen) days. For IM use only       Allergies: No Known Allergies  Family History: History reviewed. No pertinent family history.  Social History:  reports that he has never smoked. He has never used smokeless tobacco. He reports that he does not drink alcohol or use drugs.  ROS: UROLOGY Frequent Urination?: No Hard to postpone urination?: No Burning/pain with urination?: No Get up at night to urinate?: Yes Leakage of urine?: Yes Urine stream starts and stops?: Yes Trouble starting stream?: Yes Do you have to strain to urinate?: No Blood in urine?: No Urinary tract infection?: No Sexually transmitted disease?: No Injury to kidneys or bladder?: No Painful intercourse?: Yes Weak stream?: No Erection problems?: Yes Penile pain?: No  Gastrointestinal Nausea?: No Vomiting?: No Indigestion/heartburn?: Yes Diarrhea?: No Constipation?:  No  Constitutional Fever: No Night sweats?: Yes Weight loss?: No Fatigue?: No  Skin Skin rash/lesions?: No Itching?: No  Eyes Blurred vision?: Yes Double vision?: No  Ears/Nose/Throat Sore throat?: No Sinus problems?: No  Hematologic/Lymphatic Swollen glands?: No Easy bruising?: No  Cardiovascular Leg swelling?: No Chest pain?: No  Respiratory Cough?: No Shortness of breath?: No  Endocrine Excessive thirst?: No  Musculoskeletal Back pain?: No Joint pain?: Yes  Neurological Headaches?: No Dizziness?: No  Psychologic Depression?: No Anxiety?: No  Physical Exam: BP 128/79 (BP Location: Right Arm, Patient Position: Sitting, Cuff Size: Normal)   Pulse 88   Ht 5\' 7"  (1.702 m)   Wt 194 lb 6.4 oz (88.2 kg)   BMI 30.45 kg/m   Constitutional:  Alert  and oriented, No acute distress. HEENT: [place] AT, moist mucus membranes.  Trachea midline, no masses. Cardiovascular: No clubbing, cyanosis, or edema. Respiratory: Normal respiratory effort, no increased work of breathing. GI: Abdomen is soft, nontender, nondistended, no abdominal masses GU: No CVA tenderness. Skin: No rashes, bruises or suspicious lesions. Lymph: No cervical or inguinal adenopathy. Neurologic: Grossly intact, no focal deficits, moving all 4 extremities. Psychiatric: Normal mood and affect.  Laboratory Data: Lab Results  Component Value Date   WBC 5.0 03/19/2016   HGB 15.7 03/19/2016   HCT 46.6 03/19/2016   MCV 95.8 03/19/2016   PLT 228 03/19/2016    Lab Results  Component Value Date   CREATININE 1.14 03/19/2016    Urinalysis Lab Results  Component Value Date   APPEARANCEUR CLEAR 01/21/2010   LEUKOCYTESUR NEGATIVE 01/21/2010   PROTEINUR NEGATIVE 01/21/2010   GLUCOSEU NEGATIVE 01/21/2010   BILIRUBINUR NEGATIVE 01/21/2010   NITRITE NEGATIVE 01/21/2010    Lab Results  Component Value Date   BACTERIA RARE 01/21/2010    Assessment & Plan:   1. BPH with obstruction/lower  urinary tract symptoms No significant PVR by bladder scan.  Continue tamsulosin - Urinalysis, Complete - Bladder Scan (Post Void Residual) in office   2. Hypogonadism, male Recheck testosterone level and if stable follow-up testosterone level in 3 months and clinic visit in 6 months  - Testosterone  3. Chronic prostatitis Terazosin was refilled.   Return in about 6 months (around 08/13/2017) for Recheck.  Riki Altes, MD  [facility] [address], [address] [place], [place] [address] [phone]

## 2017-02-13 ENCOUNTER — Other Ambulatory Visit: Payer: Self-pay | Admitting: Urology

## 2017-02-13 LAB — URINALYSIS, COMPLETE
BILIRUBIN UA: NEGATIVE
GLUCOSE, UA: NEGATIVE
Leukocytes, UA: NEGATIVE
NITRITE UA: NEGATIVE
Protein, UA: NEGATIVE
RBC UA: NEGATIVE
SPEC GRAV UA: 1.015 (ref 1.005–1.030)
UUROB: 0.2 mg/dL (ref 0.2–1.0)
pH, UA: 5.5 (ref 5.0–7.5)

## 2017-02-13 LAB — TESTOSTERONE: Testosterone: 393 ng/dL (ref 264–916)

## 2017-02-13 NOTE — Telephone Encounter (Signed)
LMOM for patient to call back about labs. 

## 2017-02-13 NOTE — Telephone Encounter (Signed)
-----   Message from Riki Altes, MD sent at 02/13/2017  7:59 AM EDT ----- Testosterone level was 393.  Can increase dose to 0.7cc weekly.  Repeat level as discussed in 2-3 months.

## 2017-02-13 NOTE — Telephone Encounter (Signed)
Patient called back and I gave him message. Patient understands about new dosage and will call back to follow up with labs in 2-3 months. Patient ok with plan.

## 2017-03-10 ENCOUNTER — Telehealth: Payer: Self-pay

## 2017-03-10 NOTE — Telephone Encounter (Signed)
Pt daughter left a message on triage line that pt needs a refill of testosterone as you increased his dose to .7. Please advise.

## 2017-03-11 MED ORDER — TESTOSTERONE CYPIONATE 200 MG/ML IM SOLN: 140.0000 mg | INTRAMUSCULAR | 0 refills | Status: DC

## 2017-03-11 NOTE — Telephone Encounter (Signed)
Spoke with wife in reference to testosterone. Made aware script was faxed to pharmacy.

## 2017-03-11 NOTE — Telephone Encounter (Signed)
Rx was printed.

## 2017-04-17 ENCOUNTER — Telehealth: Payer: Self-pay

## 2017-04-17 ENCOUNTER — Other Ambulatory Visit: Payer: Self-pay

## 2017-04-17 NOTE — Telephone Encounter (Signed)
Pt called requesting more testosterone medication. Please advise.

## 2017-04-17 NOTE — Progress Notes (Signed)
Error

## 2017-04-23 MED ORDER — TESTOSTERONE CYPIONATE 200 MG/ML IM SOLN: 140.0000 mg | INTRAMUSCULAR | 0 refills | Status: DC

## 2017-04-23 NOTE — Telephone Encounter (Signed)
rx was sent- he needs a repeat T level this month

## 2017-04-27 ENCOUNTER — Telehealth: Payer: Self-pay | Admitting: Urology

## 2017-04-27 NOTE — Telephone Encounter (Signed)
Patient called the office today and is requesting a new prescription for Cialis 20mg  (as needed basis) sent to [facility] [address] in [place].

## 2017-04-28 MED ORDER — TADALAFIL 20 MG PO TABS: 20.0000 mg | ORAL_TABLET | Freq: Every day | ORAL | 3 refills | Status: DC | PRN

## 2017-04-28 NOTE — Telephone Encounter (Signed)
Refills sent to pharmacy. 

## 2017-05-04 NOTE — Telephone Encounter (Signed)
[name] wanted it noted that he has been off of his medication for 3 weeks now. We finally got the prior auth and it was done today and faxed to the pharmacy. Patient has been notified of this. They just wanted you to be aware.  Blake [name]

## 2017-05-14 ENCOUNTER — Other Ambulatory Visit: Payer: Medicare Other

## 2017-05-15 ENCOUNTER — Other Ambulatory Visit: Payer: Medicare Other

## 2017-05-15 ENCOUNTER — Other Ambulatory Visit: Payer: Self-pay

## 2017-05-15 DIAGNOSIS — E291 Testicular hypofunction: Secondary | ICD-10-CM

## 2017-05-16 LAB — TESTOSTERONE

## 2017-05-18 ENCOUNTER — Telehealth: Payer: Self-pay | Admitting: Family Medicine

## 2017-05-18 NOTE — Telephone Encounter (Signed)
-----   Message from Riki Altes, MD sent at 05/17/2017  2:44 PM EST ----- Testosterone level was greater than 1500.  Please verify his dose and his last injection.  He needs to hold his injection for 3 weeks then start back at 0.5 mg weekly.  He will need a repeat testosterone level in 3-4 weeks after restarting.

## 2017-05-18 NOTE — Telephone Encounter (Signed)
LMOM for patient to call back.

## 2017-05-19 NOTE — Telephone Encounter (Signed)
Spoke with pt wife in reference to testosterone results. Made wife aware to hold injection x3 weeks and restart injections at 0.5mg  and have a lab draw 4 weeks after restarting injections. Wife voiced understanding. Wife will call back when pt restarts injections.

## 2017-06-05 DIAGNOSIS — F901 Attention-deficit hyperactivity disorder, predominantly hyperactive type: Secondary | ICD-10-CM | POA: Insufficient documentation

## 2017-06-16 ENCOUNTER — Telehealth: Payer: Self-pay | Admitting: Urology

## 2017-06-16 NOTE — Telephone Encounter (Signed)
Patient called the office today requesting a prescription from Dr. Lonna Cobb for 1 inch 22G needles and 3cc syringes to [facility] [address]. For his testosterone injections.

## 2017-06-19 MED ORDER — "SYRINGE/NEEDLE (DISP) 22G X 1-1/2"" 3 ML MISC": 1 refills | Status: DC

## 2017-06-19 NOTE — Telephone Encounter (Signed)
Spoke with pt and made aware syringes were sent to pharmacy. Pt voiced understanding.

## 2017-06-22 ENCOUNTER — Other Ambulatory Visit: Payer: Self-pay

## 2017-06-22 MED ORDER — TAMSULOSIN HCL 0.4 MG PO CAPS: 0.4000 mg | ORAL_CAPSULE | Freq: Every day | ORAL | 3 refills | Status: DC

## 2017-07-01 ENCOUNTER — Other Ambulatory Visit: Payer: Medicare Other

## 2017-07-01 ENCOUNTER — Other Ambulatory Visit: Payer: Self-pay

## 2017-07-01 DIAGNOSIS — E291 Testicular hypofunction: Secondary | ICD-10-CM

## 2017-07-02 LAB — TESTOSTERONE: TESTOSTERONE: 833 ng/dL (ref 264–916)

## 2017-07-06 ENCOUNTER — Telehealth: Payer: Self-pay

## 2017-07-06 NOTE — Telephone Encounter (Signed)
Pt wife states pt's last injection was 4 days (Saturday) Prior to the blood draw.  Please advise. Thanks.

## 2017-07-06 NOTE — Telephone Encounter (Signed)
-----   Message from Riki Altes, MD sent at 07/05/2017  8:10 PM EDT ----- Testosterone level was 833.  When was his last testosterone injection in relation to this blood draw?

## 2017-07-06 NOTE — Telephone Encounter (Signed)
LMOM

## 2017-07-07 NOTE — Telephone Encounter (Signed)
Continue the present dose.  Follow-up as scheduled

## 2017-07-09 NOTE — Telephone Encounter (Signed)
Spoke with pt wife and made aware to continue current dose. Wife voiced understanding.

## 2017-08-13 ENCOUNTER — Encounter: Payer: Self-pay | Admitting: Urology

## 2017-08-13 ENCOUNTER — Ambulatory Visit (INDEPENDENT_AMBULATORY_CARE_PROVIDER_SITE_OTHER): Payer: Medicare Other | Admitting: Urology

## 2017-08-13 VITALS — BP 105/72 | HR 89 | Resp 16 | Ht 67.0 in | Wt 198.8 lb

## 2017-08-13 DIAGNOSIS — N138 Other obstructive and reflux uropathy: Secondary | ICD-10-CM | POA: Diagnosis not present

## 2017-08-13 DIAGNOSIS — E291 Testicular hypofunction: Secondary | ICD-10-CM | POA: Diagnosis not present

## 2017-08-13 DIAGNOSIS — N401 Enlarged prostate with lower urinary tract symptoms: Secondary | ICD-10-CM

## 2017-08-13 MED ORDER — TAMSULOSIN HCL 0.4 MG PO CAPS: 0.4000 mg | ORAL_CAPSULE | Freq: Two times a day (BID) | ORAL | 3 refills | Status: DC

## 2017-08-13 MED ORDER — TADALAFIL 20 MG PO TABS: 20.0000 mg | ORAL_TABLET | Freq: Every day | ORAL | 3 refills | Status: DC | PRN

## 2017-08-13 NOTE — Progress Notes (Signed)
08/13/2017 4:02 PM   Toney Sang III 1950/08/29 [id number]  Referring provider: Dorothey Baseman, MD [phone] [address] [place], [place] [address]  Chief Complaint  Patient presents with  . Follow-up   Urologic problem list: -BPH with lower urinary tract symptoms -Chronic prostatitis/chronic pelvic pain syndrome (painful ejaculation) -Hypogonadism -Erectile dysfunction  HPI: 67 year old male with the above problem list.  He was last seen on 02/12/2017.  His testosterone dose was decreased as his level was greater than 1500.  He had a repeat testosterone level last month which was 863.  He complains of decreased libido.  He remains on tamsulosin 0.4 mg daily with stable lower urinary tract symptoms.  He is also on a minimal dose of terazosin at 1 mg however states this controls his painful ejaculation.  Since his last visit he has noted some worsening urinary hesitancy and decreased force and caliber of his stream along with nocturia x2-3.  Denies dysuria or gross hematuria.   PMH: Past Medical History:  Diagnosis Date  . 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 Asperger's syndrome    (per wife)  . 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 CAD (coronary artery disease) 09/21/2013  . 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 Chronic left shoulder pain 04/03/2016  . 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 Coronary artery disease   . Dementia   . Depression   . Disorder of male genital organs 07/11/2013  . Encounter for long-term (current) use of medications 11/21/2014  . Erectile dysfunction 07/11/2013  . Executive function deficit 03/06/2016  . GERD (gastroesophageal reflux disease)   . Headache    stress  . Hearing loss   . Hyperlipidemia   . Hypertension   . Hypogonadism, 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  . 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  . 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 Major  depression, recurrent, chronic (HCC) 03/06/2016  . 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 Mixed hyperlipidemia 02/02/2014  . Myocardial infarction (HCC) 08/2013  . OCD (obsessive compulsive disorder)   . 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  . Other specified disorder of male genital organs(608.89) 07/11/2013  . Prostatic hypertrophy   . Pseudophakia of both eyes 04/20/2015  . Raynaud's disease   . Reduced libido 07/11/2013  . Repeated falls    weak left ankle  . Scoliosis   . Stroke (HCC) 2/16   "light"  . Synovitis    knees, ankles    Surgical History: Past Surgical History:  Procedure Laterality Date  . BACK SURGERY  2011   rods and screws  . COLONOSCOPY  2015  . CYSTOSCOPY  1984  . EYE SURGERY Bilateral 2005,2006,2009   detached retina, cataract  . FOOT ARTHRODESIS Left 05/30/2015   Procedure: ARTHRODESIS FOOT STJ LT FOOT;  Surgeon: [name], DPM;  Location: [facility];  Service: Podiatry;  Laterality: Left;  WITH POPLITEAL BLOCK  . FOOT SURGERY Left   . HERNIA REPAIR Right 1969   inguinal  . SHOULDER SURGERY Right 2004  . skull surgery    . TOE SURGERY Right 2009  . TONSILLECTOMY  2008    Home Medications:  Allergies as of 08/13/2017   No Known Allergies     Medication List        Accurate as of 08/13/17  4:02 PM. Always use your most recent med list.  aspirin 81 MG tablet Take 81 mg by mouth daily.   calcium-vitamin D 500-200 MG-UNIT tablet Commonly known as:  OSCAL WITH D Take 1 tablet by mouth.   clindamycin 1 % lotion Commonly known as:  CLEOCIN T Apply topically 2 (two) times daily as needed.   clopidogrel 75 MG tablet Commonly known as:  PLAVIX Take 75 mg by mouth daily.   dexlansoprazole 60 MG capsule Commonly known as:  DEXILANT Take 60 mg by mouth daily.   donepezil 10 MG tablet Commonly known as:  ARICEPT Take 10 mg by mouth at bedtime.   DULOXETINE HCL PO Take 60 mg by mouth  daily.   FIBER PO Take 1 tablet by mouth 2 (two) times daily.   fluticasone 27.5 MCG/SPRAY nasal spray Commonly known as:  VERAMYST Place 1 spray into the nose daily.   folic acid 1 MG tablet Commonly known as:  FOLVITE Take 1 mg by mouth daily.   lisinopril 40 MG tablet Commonly known as:  PRINIVIL,ZESTRIL Take 1 tablet (40 mg total) by mouth daily.   methotrexate 2.5 MG tablet Commonly known as:  RHEUMATREX Take 20 mg by mouth once a week.   methylphenidate 10 MG tablet Commonly known as:  RITALIN Take 10 mg by mouth 2 (two) times daily. 27 mg AM, 10 mg afternoon   prednisoLONE acetate 1 % ophthalmic suspension Commonly known as:  PRED FORTE Place 1 drop into the right eye 4 (four) times daily.   PROBIOTIC DAILY PO Take by mouth daily.   RABEprazole 20 MG tablet Commonly known as:  ACIPHEX Take 20 mg by mouth daily.   SYRINGE-NEEDLE (DISP) 3 ML 22G X 1-1/2" 3 ML Misc Use as directed with testosterone   tadalafil 20 MG tablet Commonly known as:  CIALIS Take 1 tablet (20 mg total) by mouth daily as needed for erectile dysfunction.   tamsulosin 0.4 MG Caps capsule Commonly known as:  FLOMAX Take 0.4 mg by mouth daily.   tamsulosin 0.4 MG Caps capsule Commonly known as:  FLOMAX Take 1 capsule (0.4 mg total) by mouth daily.   terazosin 1 MG capsule Commonly known as:  HYTRIN Take 1 capsule (1 mg total) by mouth at bedtime.   testosterone cypionate 200 MG/ML injection Commonly known as:  DEPOTESTOSTERONE CYPIONATE Inject 0.7 mLs (140 mg total) into the muscle once a week.       Allergies: No Known Allergies  Family History: History reviewed. No pertinent family history.  Social History:  reports that he has never smoked. He has never used smokeless tobacco. He reports that he does not drink alcohol or use drugs.  ROS: UROLOGY Frequent Urination?: No Hard to postpone urination?: No Burning/pain with urination?: No Get up at night to urinate?:  Yes Leakage of urine?: No Urine stream starts and stops?: Yes Trouble starting stream?: Yes Do you have to strain to urinate?: No Blood in urine?: No Urinary tract infection?: No Sexually transmitted disease?: No Injury to kidneys or bladder?: No Painful intercourse?: Yes Weak stream?: No Erection problems?: Yes Penile pain?: No  Gastrointestinal Nausea?: No Vomiting?: No Indigestion/heartburn?: No Diarrhea?: No Constipation?: No  Constitutional Fever: No Night sweats?: Yes Weight loss?: No Fatigue?: No  Skin Skin rash/lesions?: No Itching?: No  Eyes Blurred vision?: Yes Double vision?: No  Ears/Nose/Throat Sore throat?: No Sinus problems?: No  Hematologic/Lymphatic Swollen glands?: No Easy bruising?: No  Cardiovascular Leg swelling?: No Chest pain?: No  Respiratory Cough?: No Shortness of breath?: Yes  Endocrine  Excessive thirst?: No  Musculoskeletal Back pain?: No Joint pain?: Yes  Neurological Headaches?: No Dizziness?: No  Psychologic Depression?: Yes Anxiety?: No  Physical Exam: BP 105/72   Pulse 89   Resp 16   Ht 5\' 7"  (1.702 m)   Wt 198 lb 12.8 oz (90.2 kg)   SpO2 98%   BMI 31.14 kg/m   Constitutional:  Alert, No acute distress. HEENT: [place] AT, moist mucus membranes.  Trachea midline, no masses. Cardiovascular: No clubbing, cyanosis, or edema. Respiratory: Normal respiratory effort, no increased work of breathing. GI: Abdomen is soft, nontender, nondistended, no abdominal masses GU: No CVA tenderness.  Prostate 45 g, smooth without nodules Lymph: No cervical or inguinal lymphadenopathy. Skin: No rashes, bruises or suspicious lesions. Neurologic: Grossly intact, no focal deficits, moving all 4 extremities. Psychiatric: Normal mood and affect.  Laboratory Data: Lab Results  Component Value Date   TESTOSTERONE 833 07/01/2017    Assessment & Plan:    1. Hypogonadism in male Continue present dose of 100 mg weekly.  Repeat  testosterone level was drawn today along with a PSA and hematocrit.  Follow-up blood work 6 months and visit in 1 year.  2. Benign prostatic hyperplasia with urinary obstruction He has noted worsening lower urinary tract symptoms.  Will increase his tamsulosin to 0.8 mg daily and have him discontinue the terazosin  3.  Erectile dysfunction He states tadalafil has worked best however has been cost prohibitive.  They were informed that generic tadalafil is available and Rx was sent to his pharmacy.    Riki Altes, MD  [facility] [address] [place], [place] [address] [phone]

## 2017-08-14 ENCOUNTER — Encounter: Payer: Self-pay | Admitting: Urology

## 2017-08-14 LAB — PSA: Prostate Specific Ag, Serum: 2.9 ng/mL (ref 0.0–4.0)

## 2017-08-14 LAB — HEMATOCRIT: Hematocrit: 46.7 % (ref 37.5–51.0)

## 2017-08-14 LAB — TESTOSTERONE: Testosterone: 506 ng/dL (ref 264–916)

## 2017-08-17 ENCOUNTER — Telehealth: Payer: Self-pay | Admitting: Urology

## 2017-08-17 ENCOUNTER — Telehealth: Payer: Self-pay

## 2017-08-17 NOTE — Telephone Encounter (Signed)
Patient is requesting Testosterone refill. His last ov was 08/13/17. Last filled 04/23/2017. Please advise

## 2017-08-17 NOTE — Telephone Encounter (Signed)
Left pt mess to call 

## 2017-08-17 NOTE — Telephone Encounter (Signed)
Pt wife called office stating that pt was seen last week by Dr. Lonna Cobb, a Rx for his testosterone was supposed to be sent in to his pharmacy [facility] on [address], however, the pharmacy doesn't have this Rx. Pt is completely out of testosterone.  Please advise. Thanks.

## 2017-08-17 NOTE — Telephone Encounter (Signed)
-----   Message from Riki Altes, MD sent at 08/16/2017 10:39 AM EDT ----- Testosterone level looks good at 506.  Hematocrit was normal.  PSA was elevated above baseline at 2.9.  Recommend a lab visit with repeat PSA in 3 months

## 2017-08-18 MED ORDER — TESTOSTERONE CYPIONATE 200 MG/ML IM SOLN: 100.0000 mg | INTRAMUSCULAR | 0 refills | Status: DC

## 2017-08-24 NOTE — Telephone Encounter (Signed)
RX sent to pharmacy  

## 2017-11-10 ENCOUNTER — Other Ambulatory Visit: Payer: Self-pay

## 2017-11-10 MED ORDER — TAMSULOSIN HCL 0.4 MG PO CAPS: 0.4000 mg | ORAL_CAPSULE | Freq: Two times a day (BID) | ORAL | 3 refills | Status: DC

## 2017-12-02 ENCOUNTER — Telehealth: Payer: Self-pay | Admitting: Urology

## 2017-12-02 DIAGNOSIS — N138 Other obstructive and reflux uropathy: Secondary | ICD-10-CM

## 2017-12-02 DIAGNOSIS — N401 Enlarged prostate with lower urinary tract symptoms: Principal | ICD-10-CM

## 2017-12-02 DIAGNOSIS — E291 Testicular hypofunction: Secondary | ICD-10-CM

## 2017-12-02 NOTE — Telephone Encounter (Signed)
Pt lmom asking when is a good date for him to come to office to get another testosterone level drawn? Please advise pt @ [phone]. Thank you.

## 2017-12-03 NOTE — Addendum Note (Signed)
Addended by: Martha Clan on: 12/03/2017 10:42 AM   Modules accepted: Orders

## 2017-12-03 NOTE — Telephone Encounter (Signed)
Patient notified he is due for labs now orders placed and apt made for lab draw tomorrow

## 2017-12-04 ENCOUNTER — Other Ambulatory Visit: Payer: Self-pay

## 2017-12-04 ENCOUNTER — Other Ambulatory Visit: Payer: Medicare Other

## 2017-12-04 DIAGNOSIS — E291 Testicular hypofunction: Secondary | ICD-10-CM

## 2017-12-04 DIAGNOSIS — N401 Enlarged prostate with lower urinary tract symptoms: Secondary | ICD-10-CM

## 2017-12-04 DIAGNOSIS — N138 Other obstructive and reflux uropathy: Secondary | ICD-10-CM

## 2017-12-05 LAB — PSA: Prostate Specific Ag, Serum: 3.1 ng/mL (ref 0.0–4.0)

## 2017-12-05 LAB — HEMATOCRIT: Hematocrit: 43.1 % (ref 37.5–51.0)

## 2017-12-05 LAB — TESTOSTERONE: TESTOSTERONE: 585 ng/dL (ref 264–916)

## 2017-12-07 ENCOUNTER — Telehealth: Payer: Self-pay

## 2017-12-07 NOTE — Telephone Encounter (Signed)
-----   Message from Riki Altes, MD sent at 12/06/2017  9:56 AM EDT ----- Testosterone level looks good at 585.  Hematocrit was normal and PSA stable at 3.1

## 2017-12-07 NOTE — Telephone Encounter (Signed)
Called pt no answer, LM for pt informing him of lab results.

## 2018-02-23 ENCOUNTER — Other Ambulatory Visit: Payer: Self-pay | Admitting: Family Medicine

## 2018-02-24 MED ORDER — TESTOSTERONE CYPIONATE 200 MG/ML IM SOLN: 100.0000 mg | INTRAMUSCULAR | 0 refills | Status: DC

## 2018-02-26 ENCOUNTER — Other Ambulatory Visit: Payer: Self-pay

## 2018-02-26 DIAGNOSIS — N401 Enlarged prostate with lower urinary tract symptoms: Principal | ICD-10-CM

## 2018-02-26 DIAGNOSIS — N138 Other obstructive and reflux uropathy: Secondary | ICD-10-CM

## 2018-02-26 MED ORDER — TAMSULOSIN HCL 0.4 MG PO CAPS: 0.4000 mg | ORAL_CAPSULE | Freq: Two times a day (BID) | ORAL | 6 refills | Status: DC

## 2018-04-07 ENCOUNTER — Telehealth: Payer: Self-pay | Admitting: Urology

## 2018-04-07 DIAGNOSIS — E291 Testicular hypofunction: Secondary | ICD-10-CM

## 2018-04-07 NOTE — Telephone Encounter (Signed)
Does patient need an appointment or can he just have lab work?

## 2018-04-07 NOTE — Telephone Encounter (Signed)
Pt called office states he isn't satisfied with his libido, pt wants to know if he can have labs done every 3 mos. Pt also states he is irritable.  Please advise his wife Cyndi on her cell as she coordinates his appts [phone].

## 2018-04-08 NOTE — Telephone Encounter (Signed)
Informed patient he can have lab work done.  Appointment made.

## 2018-04-08 NOTE — Telephone Encounter (Signed)
He can have a lab visit/testosterone level.  Order was entered

## 2018-04-09 ENCOUNTER — Other Ambulatory Visit: Payer: Medicare Other

## 2018-04-09 DIAGNOSIS — E291 Testicular hypofunction: Secondary | ICD-10-CM

## 2018-04-10 LAB — TESTOSTERONE

## 2018-04-12 ENCOUNTER — Telehealth: Payer: Self-pay | Admitting: Family Medicine

## 2018-04-12 DIAGNOSIS — E291 Testicular hypofunction: Secondary | ICD-10-CM

## 2018-04-12 NOTE — Telephone Encounter (Signed)
He did his injection on 12/18 and had lab drawn on 12/20. I informed him for future reference he needs have blood drawn on the 7th day after having injection, he does his injections every 14 days.

## 2018-04-12 NOTE — Telephone Encounter (Signed)
-----   Message from Riki Altes, MD sent at 04/11/2018 10:35 AM EST ----- Testosterone level was >1500 which is significantly elevated.  When was his last injection in relation to this blood draw?

## 2018-04-12 NOTE — Telephone Encounter (Signed)
Recommend his testosterone level be repeated just prior to next scheduled injection however do not administer the injection until he has heard from our office with the results.

## 2018-04-12 NOTE — Telephone Encounter (Signed)
LM with wife for patient to call back.

## 2018-04-15 NOTE — Telephone Encounter (Signed)
Spoke to patients wife regarding testosterone level. Patient scheduled to come in Mon 04/19/18 to have lab drawn. Advised to hold injection until after hearing back from office on result. Wife understands.

## 2018-04-19 ENCOUNTER — Other Ambulatory Visit: Payer: Medicare Other

## 2018-04-19 DIAGNOSIS — E291 Testicular hypofunction: Secondary | ICD-10-CM

## 2018-04-20 LAB — TESTOSTERONE: Testosterone: 67 ng/dL — ABNORMAL LOW (ref 264–916)

## 2018-04-22 ENCOUNTER — Telehealth: Payer: Self-pay | Admitting: Family Medicine

## 2018-04-22 ENCOUNTER — Other Ambulatory Visit: Payer: Self-pay | Admitting: Urology

## 2018-04-22 MED ORDER — TESTOSTERONE CYPIONATE 200 MG/ML IM SOLN: 50.0000 mg | INTRAMUSCULAR | 0 refills | Status: DC

## 2018-04-22 NOTE — Telephone Encounter (Signed)
Patient notified and voiced understanding. [place], he will call and schedule lab appointment when he comes back.

## 2018-04-22 NOTE — Telephone Encounter (Signed)
-----   Message from Riki Altes, MD sent at 04/22/2018  9:01 AM EST ----- Trough testosterone level was low at 67.  In order to lessen the variation in his levels would recommend changing his testosterone dose to 0.25 mg weekly and repeating a midcycle testosterone level in 4-6 weeks.

## 2018-06-05 ENCOUNTER — Emergency Department
Admission: EM | Admit: 2018-06-05 | Discharge: 2018-06-05 | Disposition: A | Discharge status: Home / Self Care | Payer: Medicare Other | Attending: Emergency Medicine | Admitting: Emergency Medicine

## 2018-06-05 ENCOUNTER — Emergency Department: Payer: Medicare Other

## 2018-06-05 ENCOUNTER — Encounter: Payer: Self-pay | Admitting: Emergency Medicine

## 2018-06-05 ENCOUNTER — Other Ambulatory Visit: Payer: Self-pay

## 2018-06-05 DIAGNOSIS — S0083XA Contusion of other part of head, initial encounter: Secondary | ICD-10-CM | POA: Insufficient documentation

## 2018-06-05 DIAGNOSIS — Y92018 Other place in single-family (private) house as the place of occurrence of the external cause: Secondary | ICD-10-CM | POA: Insufficient documentation

## 2018-06-05 DIAGNOSIS — F0390 Unspecified dementia without behavioral disturbance: Secondary | ICD-10-CM | POA: Diagnosis not present

## 2018-06-05 DIAGNOSIS — W01198A Fall on same level from slipping, tripping and stumbling with subsequent striking against other object, initial encounter: Secondary | ICD-10-CM | POA: Diagnosis not present

## 2018-06-05 DIAGNOSIS — Z8673 Personal history of transient ischemic attack (TIA), and cerebral infarction without residual deficits: Secondary | ICD-10-CM | POA: Diagnosis not present

## 2018-06-05 DIAGNOSIS — Y9301 Activity, walking, marching and hiking: Secondary | ICD-10-CM | POA: Diagnosis not present

## 2018-06-05 DIAGNOSIS — I2510 Atherosclerotic heart disease of native coronary artery without angina pectoris: Secondary | ICD-10-CM | POA: Insufficient documentation

## 2018-06-05 DIAGNOSIS — Y998 Other external cause status: Secondary | ICD-10-CM | POA: Diagnosis not present

## 2018-06-05 DIAGNOSIS — I10 Essential (primary) hypertension: Secondary | ICD-10-CM | POA: Insufficient documentation

## 2018-06-05 DIAGNOSIS — S022XXA Fracture of nasal bones, initial encounter for closed fracture: Secondary | ICD-10-CM

## 2018-06-05 DIAGNOSIS — W19XXXA Unspecified fall, initial encounter: Secondary | ICD-10-CM

## 2018-06-05 DIAGNOSIS — Z79899 Other long term (current) drug therapy: Secondary | ICD-10-CM | POA: Insufficient documentation

## 2018-06-05 DIAGNOSIS — Z7982 Long term (current) use of aspirin: Secondary | ICD-10-CM | POA: Diagnosis not present

## 2018-06-05 DIAGNOSIS — S0990XA Unspecified injury of head, initial encounter: Secondary | ICD-10-CM | POA: Diagnosis present

## 2018-06-05 MED ORDER — OXYMETAZOLINE HCL 0.05 % NA SOLN
1.0000 | Freq: Once | NASAL | Status: AC
  Administered 2018-06-05: 1 via NASAL
  Filled 2018-06-05: qty 30

## 2018-06-05 MED ORDER — AMOXICILLIN-POT CLAVULANATE 875-125 MG PO TABS
1.0000 | ORAL_TABLET | Freq: Once | ORAL | Status: AC
  Administered 2018-06-05: 1 via ORAL
  Filled 2018-06-05: qty 1

## 2018-06-05 MED ORDER — AMOXICILLIN-POT CLAVULANATE 875-125 MG PO TABS: 1.0000 | ORAL_TABLET | Freq: Two times a day (BID) | ORAL | 0 refills | Status: AC

## 2018-06-05 MED ORDER — ACETAMINOPHEN 500 MG PO TABS
1000.0000 mg | ORAL_TABLET | Freq: Once | ORAL | Status: AC
  Administered 2018-06-05: 1000 mg via ORAL
  Filled 2018-06-05: qty 2

## 2018-06-05 MED ORDER — TRAMADOL HCL 50 MG PO TABS
50.0000 mg | ORAL_TABLET | Freq: Once | ORAL | Status: AC
  Administered 2018-06-05: 50 mg via ORAL
  Filled 2018-06-05: qty 1

## 2018-06-05 MED ORDER — TRAMADOL HCL 50 MG PO TABS: 50.0000 mg | ORAL_TABLET | Freq: Four times a day (QID) | ORAL | 0 refills | Status: AC | PRN

## 2018-06-05 MED ORDER — LIDOCAINE VISCOUS HCL 2 % MT SOLN
15.0000 mL | Freq: Once | OROMUCOSAL | Status: AC
  Administered 2018-06-05: 15 mL via OROMUCOSAL

## 2018-06-05 NOTE — ED Notes (Addendum)
No peripheral IV placed this visit.    Discharge instructions reviewed with patient. Questions fielded by this RN. Patient verbalizes understanding of instructions. Patient discharged home in stable condition per kinner. No acute distress noted at time of discharge.   Bandaged applied to bridge of nose and sub nare area for bleeding control

## 2018-06-05 NOTE — ED Triage Notes (Addendum)
Pt presents to ED c/o fall. States he tripped over plastic partition in his basement and fell face first on brick flooring. On plavix and 81mg  ASA. Denies LOC. Tetanus up to date.

## 2018-06-05 NOTE — ED Provider Notes (Signed)
[facility] Emergency Department Provider Note   ____________________________________________    I have reviewed the triage vital signs and the nursing notes.   HISTORY  Chief Complaint Fall     HPI Blake [name] is a 68 y.o. male who presents after a fall.  Patient reports he tripped over a partition in his basement and fell onto his face.  He does take Plavix and aspirin, no other blood thinners.  Complains of pain and bleeding of the nose.  No LOC.  No neck pain.  No chest wall pain.  No abdominal or extremity injuries.  No dizziness.  Primary complaint now is epistaxis   Past Medical History:  Diagnosis Date  . 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 Asperger's syndrome    (per wife)  . 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 CAD (coronary artery disease) 09/21/2013  . 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 Chronic left shoulder pain 04/03/2016  . 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 Coronary artery disease   . Dementia (HCC)   . Depression   . Disorder of male genital organs 07/11/2013  . Encounter for long-term (current) use of medications 11/21/2014  . Erectile dysfunction 07/11/2013  . Executive function deficit 03/06/2016  . GERD (gastroesophageal reflux disease)   . Headache    stress  . Hearing loss   . Hyperlipidemia   . Hypertension   . Hypogonadism, 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  . 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  . 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 Major depression, recurrent, chronic (HCC) 03/06/2016  . 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 Mixed hyperlipidemia 02/02/2014  . Myocardial infarction (HCC) 08/2013  . OCD (obsessive compulsive disorder)   . 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  . Other specified disorder of male genital organs(608.89) 07/11/2013  . Prostatic hypertrophy   .  Pseudophakia of both eyes 04/20/2015  . Raynaud's disease   . Reduced libido 07/11/2013  . Repeated falls    weak left ankle  . Scoliosis   . Stroke (HCC) 2/16   "light"  . Synovitis    knees, ankles    Patient Active Problem List   Diagnosis Date Noted  . 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 Chronic left shoulder pain 04/03/2016  . 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 Executive function deficit 03/06/2016  . Major depression, recurrent, chronic (HCC) 03/06/2016  . 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  . Pseudophakia of both eyes 04/20/2015  . Encounter for long-term (current) use of medications 11/21/2014  . Hypertension 02/02/2014  . Mixed hyperlipidemia 02/02/2014  . CAD (coronary artery disease) 09/21/2013  . 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 Reduced libido 07/11/2013  . Disorder of male genital organs 07/11/2013  . Erectile dysfunction 07/11/2013  . Hypogonadism, 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  . Other specified disorder of male genital organs(608.89) 07/11/2013  . GERD (gastroesophageal reflux disease) 01/11/2013    Past Surgical History:  Procedure Laterality Date  . BACK SURGERY  2011   rods and screws  . COLONOSCOPY  2015  . CYSTOSCOPY  1984  . EYE SURGERY Bilateral 2005,2006,2009   detached retina, cataract  . FOOT ARTHRODESIS Left  05/30/2015   Procedure: ARTHRODESIS FOOT STJ LT FOOT;  Surgeon: [name], DPM;  Location: [facility];  Service: Podiatry;  Laterality: Left;  WITH POPLITEAL BLOCK  . FOOT SURGERY Left   . HERNIA REPAIR Right 1969   inguinal  . SHOULDER SURGERY Right 2004  . skull surgery    . TOE SURGERY Right 2009  . TONSILLECTOMY  2008    Prior to Admission medications   Medication Sig Start Date End Date Taking?  Authorizing Provider  amoxicillin-clavulanate (AUGMENTIN) 875-125 MG tablet Take 1 tablet by mouth 2 (two) times daily for 7 days. 06/05/18 06/12/18  Jene Every, MD  aspirin 81 MG tablet Take 81 mg by mouth daily.    [provider]  calcium-vitamin D (OSCAL WITH D) 500-200 MG-UNIT tablet Take 1 tablet by mouth.    [provider]  clindamycin (CLEOCIN T) 1 % lotion Apply topically 2 (two) times daily as needed.    [provider]  clopidogrel (PLAVIX) 75 MG tablet Take 75 mg by mouth daily.    [provider]  dexlansoprazole (DEXILANT) 60 MG capsule Take 60 mg by mouth daily. 05/15/15   [provider]  donepezil (ARICEPT) 10 MG tablet Take 10 mg by mouth at bedtime.    [provider]  DULOXETINE HCL PO Take 60 mg by mouth daily.     [provider]  FIBER PO Take 1 tablet by mouth 2 (two) times daily.     [provider]  fluticasone (VERAMYST) 27.5 MCG/SPRAY nasal spray Place 1 spray into the nose daily.    [provider]  folic acid (FOLVITE) 1 MG tablet Take 1 mg by mouth daily.    [provider]  lisinopril (PRINIVIL,ZESTRIL) 40 MG tablet Take 1 tablet (40 mg total) by mouth daily. 03/19/16 04/02/16  Cuthriell, Delorise Royals, PA-C  methotrexate (RHEUMATREX) 2.5 MG tablet Take 20 mg by mouth once a week.     [provider]  methylphenidate (RITALIN) 10 MG tablet Take 10 mg by mouth 2 (two) times daily. 27 mg AM, 10 mg afternoon    [provider]  prednisoLONE acetate (PRED FORTE) 1 % ophthalmic suspension Place 1 drop into the right eye 4 (four) times daily.    [provider]  Probiotic Product (PROBIOTIC DAILY PO) Take by mouth daily.    [provider]  RABEprazole (ACIPHEX) 20 MG tablet Take 20 mg by mouth daily.    [provider]  SYRINGE-NEEDLE, DISP, 3 ML 22G X 1-1/2" 3 ML MISC Use as directed with testosterone 06/19/17   Stoioff, Verna Czech, MD    tadalafil (CIALIS) 20 MG tablet Take 1 tablet (20 mg total) by mouth daily as needed for erectile dysfunction. 08/13/17   Stoioff, Verna Czech, MD  tamsulosin (FLOMAX) 0.4 MG CAPS capsule Take 1 capsule (0.4 mg total) by mouth 2 (two) times daily. 02/26/18   Stoioff, Verna Czech, MD  terazosin (HYTRIN) 1 MG capsule Take 1 capsule (1 mg total) by mouth at bedtime. 02/12/17   Stoioff, Verna Czech, MD  testosterone cypionate (DEPOTESTOSTERONE CYPIONATE) 200 MG/ML injection Inject 0.25 mLs (50 mg total) into the muscle once a week. 04/22/18   Stoioff, Verna Czech, MD  traMADol (ULTRAM) 50 MG tablet Take 1 tablet (50 mg total) by mouth every 6 (six) hours as needed. 06/05/18 06/05/19  Jene Every, MD     Allergies Patient has no known allergies.  History reviewed. No pertinent family history.  Social History Social History   Tobacco Use  . Smoking status: Never Smoker  . Smokeless tobacco: Never Used  Substance Use Topics  . Alcohol use: No  . Drug use: No    Review of Systems  Constitutional: No fever/chills Eyes: No visual changes.  ENT: As above Cardiovascular: Denies chest pain. Respiratory: Denies shortness of breath. Gastrointestinal: No abdominal pain.  Genitourinary: Negative for dysuria. Musculoskeletal: Negative for back pain. Skin: Negative for rash. Neurological: Negative for weakness   ____________________________________________   PHYSICAL EXAM:  VITAL SIGNS: ED Triage Vitals  Enc Vitals Group     BP 06/05/18 1648 (!) 157/93     Pulse Rate 06/05/18 1641 87     Resp 06/05/18 1641 18     Temp 06/05/18 1641 98 F (36.7 C)     Temp src --      SpO2 06/05/18 1641 95 %     Weight 06/05/18 1643 86.2 kg (190 lb)     Height 06/05/18 1643 1.715 m (5' 7.5")     Head Circumference --      Peak Flow --      Pain Score 06/05/18 1643 5     Pain Loc --      Pain Edu? --      Excl. in GC? --     Constitutional: Alert and oriented.  Eyes: Conjunctivae are normal.  Head:  Atraumatic. Nose: Significant swelling and bruising to the nose, clots removed, no septal hematoma.  Abrasion to the bridge of the nose Mouth/Throat: Mucous membranes are moist.   Neck:  Painless ROM, no vertebral tenderness to palpation Cardiovascular:   Good peripheral circulation. Respiratory: Normal respiratory effort.  No retractions.    Musculoskeletal:   Warm and well perfused Neurologic:  Normal speech and language. No gross focal neurologic deficits are appreciated.  Skin:  Skin is warm, dry and intact. No rash noted. Psychiatric: Mood and affect are normal. Speech and behavior are normal.  ____________________________________________   LABS (all labs ordered are listed, but only abnormal results are displayed)  Labs Reviewed - No data to display ____________________________________________  EKG  None ____________________________________________  RADIOLOGY  None ____________________________________________   PROCEDURES  Procedure(s) performed:yes  .Epistaxis Management Date/Time: 06/05/2018 7:29 PM Performed by: Jene Every, MD Authorized by: Jene Every, MD   Consent:    Consent obtained:  Verbal   Consent given by:  Patient   Risks discussed:  Bleeding, infection and nasal injury Anesthesia (see MAR for exact dosages):    Anesthesia method:  Topical application   Topical anesthetic:  Lidocaine gel Procedure details:    Treatment site:  L anterior   Treatment method:  Merocel sponge Post-procedure details:    Assessment:  Bleeding decreased   Patient tolerance of procedure:  Tolerated with difficulty     Critical Care performed: No ____________________________________________   INITIAL IMPRESSION / ASSESSMENT AND PLAN / ED COURSE  Pertinent labs & imaging results that were available during my care of the patient were reviewed by me and considered in my medical decision making (see chart for details).  Patient presents after fall,  mechanical.  CT demonstrates nasal fracture, nasal septal fracture.  Assess for septal hematoma, none seen.  Unable to control left nare bleeding with gauze packing so with patient's agreement inserted nasal tampon being careful to keep the orientation horizontal.  ----------------------------------------- 8:53 PM on 06/05/2018 -----------------------------------------  Nasal tampon has stopped the bleeding from his nose, patient had small mount of bleeding from  abrasion to the bridge of the nose, covered in Vaseline gauze, given dose of tramadol and Augmentin here, appropriate for discharge at this time    ____________________________________________   FINAL CLINICAL IMPRESSION(S) / ED DIAGNOSES  Final diagnoses:  Closed fracture of nasal bone, initial encounter  Closed fracture of nasal septum, initial encounter  Fall, initial encounter  Contusion of face, initial encounter        Note:  This document was prepared using Dragon voice recognition software and may include unintentional dictation errors.   Jene EveryKinner, Cressie Betzler, MD 06/05/18 [phone]

## 2018-06-05 NOTE — ED Notes (Signed)
Pt wheeled to lobby for wife to get car

## 2018-06-05 NOTE — ED Notes (Signed)
ED Provider at bedside. 

## 2018-06-08 ENCOUNTER — Other Ambulatory Visit: Payer: Self-pay

## 2018-06-08 ENCOUNTER — Encounter
Admission: RE | Admit: 2018-06-08 | Discharge: 2018-06-08 | Disposition: A | Discharge status: Home / Self Care | Payer: Medicare Other | Source: Ambulatory Visit | Attending: Otolaryngology | Admitting: Otolaryngology

## 2018-06-08 DIAGNOSIS — N138 Other obstructive and reflux uropathy: Secondary | ICD-10-CM | POA: Diagnosis not present

## 2018-06-08 DIAGNOSIS — F329 Major depressive disorder, single episode, unspecified: Secondary | ICD-10-CM | POA: Diagnosis not present

## 2018-06-08 DIAGNOSIS — W1830XA Fall on same level, unspecified, initial encounter: Secondary | ICD-10-CM | POA: Diagnosis not present

## 2018-06-08 DIAGNOSIS — J343 Hypertrophy of nasal turbinates: Secondary | ICD-10-CM | POA: Diagnosis not present

## 2018-06-08 DIAGNOSIS — Z01818 Encounter for other preprocedural examination: Secondary | ICD-10-CM | POA: Insufficient documentation

## 2018-06-08 DIAGNOSIS — Z01810 Encounter for preprocedural cardiovascular examination: Secondary | ICD-10-CM

## 2018-06-08 DIAGNOSIS — I252 Old myocardial infarction: Secondary | ICD-10-CM | POA: Diagnosis not present

## 2018-06-08 DIAGNOSIS — I2510 Atherosclerotic heart disease of native coronary artery without angina pectoris: Secondary | ICD-10-CM | POA: Insufficient documentation

## 2018-06-08 DIAGNOSIS — Z8673 Personal history of transient ischemic attack (TIA), and cerebral infarction without residual deficits: Secondary | ICD-10-CM | POA: Diagnosis not present

## 2018-06-08 DIAGNOSIS — K219 Gastro-esophageal reflux disease without esophagitis: Secondary | ICD-10-CM | POA: Diagnosis not present

## 2018-06-08 DIAGNOSIS — R9431 Abnormal electrocardiogram [ECG] [EKG]: Secondary | ICD-10-CM

## 2018-06-08 DIAGNOSIS — S022XXA Fracture of nasal bones, initial encounter for closed fracture: Secondary | ICD-10-CM | POA: Diagnosis not present

## 2018-06-08 DIAGNOSIS — F0390 Unspecified dementia without behavioral disturbance: Secondary | ICD-10-CM | POA: Diagnosis not present

## 2018-06-08 DIAGNOSIS — J342 Deviated nasal septum: Secondary | ICD-10-CM | POA: Diagnosis not present

## 2018-06-08 DIAGNOSIS — Z7982 Long term (current) use of aspirin: Secondary | ICD-10-CM | POA: Diagnosis not present

## 2018-06-08 DIAGNOSIS — Z79899 Other long term (current) drug therapy: Secondary | ICD-10-CM | POA: Diagnosis not present

## 2018-06-08 DIAGNOSIS — I10 Essential (primary) hypertension: Secondary | ICD-10-CM | POA: Diagnosis not present

## 2018-06-08 DIAGNOSIS — R040 Epistaxis: Secondary | ICD-10-CM | POA: Diagnosis present

## 2018-06-08 DIAGNOSIS — N401 Enlarged prostate with lower urinary tract symptoms: Secondary | ICD-10-CM | POA: Diagnosis not present

## 2018-06-08 DIAGNOSIS — Z955 Presence of coronary angioplasty implant and graft: Secondary | ICD-10-CM | POA: Diagnosis not present

## 2018-06-08 HISTORY — DX: Dyspnea, unspecified: R06.00

## 2018-06-08 LAB — CBC
HCT: 43.7 % (ref 39.0–52.0)
Hemoglobin: 15.1 g/dL (ref 13.0–17.0)
MCH: 33 pg (ref 26.0–34.0)
MCHC: 34.6 g/dL (ref 30.0–36.0)
MCV: 95.4 fL (ref 80.0–100.0)
PLATELETS: 293 10*3/uL (ref 150–400)
RBC: 4.58 MIL/uL (ref 4.22–5.81)
RDW: 14.5 % (ref 11.5–15.5)
WBC: 6.8 10*3/uL (ref 4.0–10.5)
nRBC: 0 % (ref 0.0–0.2)

## 2018-06-08 LAB — BASIC METABOLIC PANEL
Anion gap: 5 (ref 5–15)
BUN: 17 mg/dL (ref 8–23)
CALCIUM: 9 mg/dL (ref 8.9–10.3)
CO2: 27 mmol/L (ref 22–32)
Chloride: 107 mmol/L (ref 98–111)
Creatinine, Ser: 1.09 mg/dL (ref 0.61–1.24)
GFR calc Af Amer: >60 mL/min (ref >60)
GFR calc non Af Amer: >60 mL/min (ref >60)
Glucose, Bld: 120 mg/dL — ABNORMAL HIGH (ref 70–99)
Potassium: 3.9 mmol/L (ref 3.5–5.1)
Sodium: 139 mmol/L (ref 135–145)

## 2018-06-08 NOTE — Patient Instructions (Signed)
Your procedure is scheduled on:06/11/18 Report to Day Surgery. [facility] second floor To find out your arrival time please call [phone] between 1PM - 3PM on 06/10/18  Remember: Instructions that are not followed completely may result in serious medical risk,  up to and including death, or upon the discretion of your surgeon and anesthesiologist your  surgery may need to be rescheduled.     _X__ 1. Do not eat food after midnight the night before your procedure.                 No gum chewing or hard candies. You may drink clear liquids up to 2 hours                 before you are scheduled to arrive for your surgery- DO not drink clear                 liquids within 2 hours of the start of your surgery.                 Clear Liquids include:  water, apple juice without pulp, clear carbohydrate                 drink such as Clearfast of Gatorade, Black Coffee or Tea (Do not add                 anything to coffee or tea).  __X__2.  On the morning of surgery brush your teeth with toothpaste and water, you                may rinse your mouth with mouthwash if you wish.  Do not swallow any toothpaste of mouthwash.     _X__ 3.  No Alcohol for 24 hours before or after surgery.   _X__ 4.  Do Not Smoke or use e-cigarettes For 24 Hours Prior to Your Surgery.                 Do not use any chewable tobacco products for at least 6 hours prior to                 surgery.  _x___  5.  Bring all medications with you on the day of surgery if instructed.  Just bring eye drops to verify doses  _x___  6.  Notify your doctor if there is any change in your medical condition      (cold, fever, infections).     Do not wear jewelry, make-up, hairpins, clips or nail polish. Do not wear lotions, powders, or perfumes. You may wear deodorant. Do not shave 48 hours prior to surgery. Men may shave face and neck. Do not bring valuables to the hospital.    [facility] is not  responsible for any belongings or valuables.  Contacts, dentures or bridgework may not be worn into surgery. Leave your suitcase in the car. After surgery it may be brought to your room. For patients admitted to the hospital, discharge time is determined by your treatment team.   Patients discharged the day of surgery will not be allowed to drive home.            X____ Take these medicines the morning of surgery with A SIP OF WATER:    1. DULOXETINE  2. TAMSULOSIN  3. EYE DROPS  4. PAIN MEDICATION AS NEEDED  5.  6.  ____ Fleet Enema (as directed)   ____ Use CHG Soap as directed  ____ Use inhalers on the day of surgery  ____ Stop metformin 2 days prior to surgery    ____ Take 1/2 of usual insulin dose the night before surgery. No insulin the morning          of surgery.   _X___ Stop Coumadin/Plavix/aspirin on  ALREADY STOPPED ASPIRIN AND PLAVIX  LAST DOSE 06/07/18 ____ Stop Anti-inflammatories on    ____ Stop supplements until after surgery.    ____ Bring C-Pap to the hospital.

## 2018-06-09 NOTE — Pre-Procedure Instructions (Signed)
CLEARED MOD RISK BY DR CALLWOOD ON CHART

## 2018-06-09 NOTE — Pre-Procedure Instructions (Signed)
RECEIVED EKG BACK FROM DR CALLWOOD. DR Elenore Rota ALREADY REQUESTED [facility] AND STILL NEEDS. FAXED REQUEST FOR [facility] DR HAWES TO OK GENERAL ANESTHESIA FOR SURGERY. WIFE STATED YESTERDAY AT PREOP SHE SPOKE WITH DR HAWES WHO SAID OK. REQUESTED WRITTEN RESPONSE. ALSO LM ON HER NURSE TRIAGE LINE

## 2018-06-10 MED ORDER — CEFAZOLIN SODIUM-DEXTROSE 2-4 GM/100ML-% IV SOLN
2.0000 g | Freq: Once | INTRAVENOUS | Status: AC
  Administered 2018-06-11: 2 g via INTRAVENOUS

## 2018-06-10 NOTE — Pre-Procedure Instructions (Signed)
CLEARED LOW RISK BY NEUROLOGIST AND ON CHART

## 2018-06-10 NOTE — Pre-Procedure Instructions (Signed)
SPOKE WITH KRISIN AT [facility]. SHE SPOKE WITH MANDY, DR HAWES MEDICAL ASSISTANT WHO HAS CLEARANCE FORM AND WILL GET SIGNED AND FAXED TO [facility] AS SOON AS DR ARRIVES AT CLINIC TODAY

## 2018-06-11 ENCOUNTER — Other Ambulatory Visit: Payer: Self-pay

## 2018-06-11 ENCOUNTER — Encounter
Admission: RE | Disposition: A | Discharge status: Home / Self Care | Payer: Self-pay | Source: Ambulatory Visit | Attending: Otolaryngology

## 2018-06-11 ENCOUNTER — Ambulatory Visit: Payer: Medicare Other | Admitting: Anesthesiology

## 2018-06-11 ENCOUNTER — Ambulatory Visit
Admission: RE | Admit: 2018-06-11 | Discharge: 2018-06-11 | Disposition: A | Discharge status: Home / Self Care | Payer: Medicare Other | Source: Ambulatory Visit | Attending: Otolaryngology | Admitting: Otolaryngology

## 2018-06-11 ENCOUNTER — Encounter: Payer: Self-pay | Admitting: *Deleted

## 2018-06-11 DIAGNOSIS — J342 Deviated nasal septum: Secondary | ICD-10-CM | POA: Diagnosis not present

## 2018-06-11 DIAGNOSIS — Z7982 Long term (current) use of aspirin: Secondary | ICD-10-CM | POA: Insufficient documentation

## 2018-06-11 DIAGNOSIS — Z955 Presence of coronary angioplasty implant and graft: Secondary | ICD-10-CM | POA: Insufficient documentation

## 2018-06-11 DIAGNOSIS — Z79899 Other long term (current) drug therapy: Secondary | ICD-10-CM | POA: Insufficient documentation

## 2018-06-11 DIAGNOSIS — K219 Gastro-esophageal reflux disease without esophagitis: Secondary | ICD-10-CM | POA: Insufficient documentation

## 2018-06-11 DIAGNOSIS — F0390 Unspecified dementia without behavioral disturbance: Secondary | ICD-10-CM | POA: Insufficient documentation

## 2018-06-11 DIAGNOSIS — S022XXA Fracture of nasal bones, initial encounter for closed fracture: Secondary | ICD-10-CM | POA: Insufficient documentation

## 2018-06-11 DIAGNOSIS — Z8673 Personal history of transient ischemic attack (TIA), and cerebral infarction without residual deficits: Secondary | ICD-10-CM | POA: Insufficient documentation

## 2018-06-11 DIAGNOSIS — J343 Hypertrophy of nasal turbinates: Secondary | ICD-10-CM | POA: Insufficient documentation

## 2018-06-11 DIAGNOSIS — I2510 Atherosclerotic heart disease of native coronary artery without angina pectoris: Secondary | ICD-10-CM | POA: Insufficient documentation

## 2018-06-11 DIAGNOSIS — I252 Old myocardial infarction: Secondary | ICD-10-CM | POA: Insufficient documentation

## 2018-06-11 DIAGNOSIS — F329 Major depressive disorder, single episode, unspecified: Secondary | ICD-10-CM | POA: Insufficient documentation

## 2018-06-11 DIAGNOSIS — W1830XA Fall on same level, unspecified, initial encounter: Secondary | ICD-10-CM | POA: Insufficient documentation

## 2018-06-11 DIAGNOSIS — N138 Other obstructive and reflux uropathy: Secondary | ICD-10-CM | POA: Insufficient documentation

## 2018-06-11 DIAGNOSIS — N401 Enlarged prostate with lower urinary tract symptoms: Secondary | ICD-10-CM | POA: Insufficient documentation

## 2018-06-11 DIAGNOSIS — I10 Essential (primary) hypertension: Secondary | ICD-10-CM | POA: Insufficient documentation

## 2018-06-11 HISTORY — PX: SEPTOPLASTY: SHX2393

## 2018-06-11 HISTORY — PX: CLOSED REDUCTION NASAL FRACTURE: SHX5365

## 2018-06-11 SURGERY — SEPTOPLASTY, NOSE
Anesthesia: General | Site: Nose | Laterality: Bilateral

## 2018-06-11 MED ORDER — AMOXICILLIN-POT CLAVULANATE 875-125 MG PO TABS: 1.0000 | ORAL_TABLET | Freq: Two times a day (BID) | ORAL | 0 refills | Status: AC

## 2018-06-11 MED ORDER — EPHEDRINE SULFATE 50 MG/ML IJ SOLN
INTRAMUSCULAR | Status: DC | PRN
  Administered 2018-06-11: 10 mg via INTRAVENOUS

## 2018-06-11 MED ORDER — PROPOFOL 10 MG/ML IV BOLUS
INTRAVENOUS | Status: AC
  Filled 2018-06-11: qty 20

## 2018-06-11 MED ORDER — OXYMETAZOLINE HCL 0.05 % NA SOLN
1.0000 | Freq: Once | NASAL | Status: AC
  Administered 2018-06-11: 1 via NASAL

## 2018-06-11 MED ORDER — SUCCINYLCHOLINE CHLORIDE 20 MG/ML IJ SOLN
INTRAMUSCULAR | Status: DC | PRN
  Administered 2018-06-11: 100 mg via INTRAVENOUS

## 2018-06-11 MED ORDER — LIDOCAINE-EPINEPHRINE 1 %-1:100000 IJ SOLN
INTRAMUSCULAR | Status: AC
  Filled 2018-06-11: qty 2

## 2018-06-11 MED ORDER — MIDAZOLAM HCL 2 MG/2ML IJ SOLN
INTRAMUSCULAR | Status: DC | PRN
  Administered 2018-06-11: 2 mg via INTRAVENOUS

## 2018-06-11 MED ORDER — PREDNISONE 10 MG PO TABS: ORAL_TABLET | ORAL | 0 refills | Status: DC

## 2018-06-11 MED ORDER — OXYCODONE HCL 5 MG PO TABS
5.0000 mg | ORAL_TABLET | Freq: Once | ORAL | Status: AC | PRN
  Administered 2018-06-11: 5 mg via ORAL

## 2018-06-11 MED ORDER — PROPOFOL 10 MG/ML IV BOLUS
INTRAVENOUS | Status: DC | PRN
  Administered 2018-06-11: 150 mg via INTRAVENOUS

## 2018-06-11 MED ORDER — LIDOCAINE HCL (PF) 1 % IJ SOLN
INTRAMUSCULAR | Status: AC
  Filled 2018-06-11: qty 30

## 2018-06-11 MED ORDER — OXYCODONE HCL 5 MG/5ML PO SOLN: 5.0000 mg | Freq: Once | ORAL | Status: AC | PRN

## 2018-06-11 MED ORDER — LIDOCAINE HCL (PF) 4 % IJ SOLN
INTRAMUSCULAR | Status: AC
  Filled 2018-06-11: qty 10

## 2018-06-11 MED ORDER — ACETAMINOPHEN 500 MG PO TABS
1000.0000 mg | ORAL_TABLET | Freq: Once | ORAL | Status: AC
  Administered 2018-06-11: 1000 mg via ORAL

## 2018-06-11 MED ORDER — FENTANYL CITRATE (PF) 100 MCG/2ML IJ SOLN
25.0000 ug | INTRAMUSCULAR | Status: DC | PRN
  Administered 2018-06-11: 25 ug via INTRAVENOUS
  Administered 2018-06-11 (×2): 50 ug via INTRAVENOUS
  Administered 2018-06-11 (×2): 25 ug via INTRAVENOUS

## 2018-06-11 MED ORDER — OXYMETAZOLINE HCL 0.05 % NA SOLN
NASAL | Status: AC
  Filled 2018-06-11: qty 30

## 2018-06-11 MED ORDER — MIDAZOLAM HCL 2 MG/2ML IJ SOLN
INTRAMUSCULAR | Status: AC
  Filled 2018-06-11: qty 2

## 2018-06-11 MED ORDER — LIDOCAINE HCL (CARDIAC) PF 100 MG/5ML IV SOSY
PREFILLED_SYRINGE | INTRAVENOUS | Status: DC | PRN
  Administered 2018-06-11: 100 mg via INTRAVENOUS

## 2018-06-11 MED ORDER — HYDROCODONE-ACETAMINOPHEN 5-325 MG PO TABS: 1.0000 | ORAL_TABLET | Freq: Four times a day (QID) | ORAL | 0 refills | Status: AC | PRN

## 2018-06-11 MED ORDER — FENTANYL CITRATE (PF) 100 MCG/2ML IJ SOLN
INTRAMUSCULAR | Status: AC
  Filled 2018-06-11: qty 2

## 2018-06-11 MED ORDER — ROCURONIUM BROMIDE 100 MG/10ML IV SOLN
INTRAVENOUS | Status: DC | PRN
  Administered 2018-06-11: 20 mg via INTRAVENOUS

## 2018-06-11 MED ORDER — FENTANYL CITRATE (PF) 100 MCG/2ML IJ SOLN
INTRAMUSCULAR | Status: AC
  Administered 2018-06-11: 25 ug via INTRAVENOUS
  Filled 2018-06-11: qty 2

## 2018-06-11 MED ORDER — DEXAMETHASONE SODIUM PHOSPHATE 10 MG/ML IJ SOLN
10.0000 mg | Freq: Once | INTRAMUSCULAR | Status: AC
  Administered 2018-06-11: 10 mg via INTRAVENOUS

## 2018-06-11 MED ORDER — PHENYLEPHRINE HCL 10 MG/ML IJ SOLN
INTRAMUSCULAR | Status: DC | PRN
  Administered 2018-06-11 (×2): 50 ug via INTRAVENOUS

## 2018-06-11 MED ORDER — FAMOTIDINE 20 MG PO TABS
ORAL_TABLET | ORAL | Status: AC
  Administered 2018-06-11: 20 mg via ORAL
  Filled 2018-06-11: qty 1

## 2018-06-11 MED ORDER — FENTANYL CITRATE (PF) 100 MCG/2ML IJ SOLN
INTRAMUSCULAR | Status: DC | PRN
  Administered 2018-06-11 (×2): 50 ug via INTRAVENOUS

## 2018-06-11 MED ORDER — LIDOCAINE-EPINEPHRINE (PF) 1 %-1:200000 IJ SOLN
INTRAMUSCULAR | Status: DC | PRN
  Administered 2018-06-11: 6 mL

## 2018-06-11 MED ORDER — OXYCODONE HCL 5 MG PO TABS
ORAL_TABLET | ORAL | Status: AC
  Administered 2018-06-11: 5 mg via ORAL
  Filled 2018-06-11: qty 1

## 2018-06-11 MED ORDER — LACTATED RINGERS IV SOLN
INTRAVENOUS | Status: DC
  Administered 2018-06-11 (×2): via INTRAVENOUS

## 2018-06-11 MED ORDER — ONDANSETRON HCL 4 MG/2ML IJ SOLN
INTRAMUSCULAR | Status: DC | PRN
  Administered 2018-06-11: 4 mg via INTRAVENOUS

## 2018-06-11 MED ORDER — FAMOTIDINE 20 MG PO TABS
20.0000 mg | ORAL_TABLET | Freq: Once | ORAL | Status: AC
  Administered 2018-06-11: 20 mg via ORAL

## 2018-06-11 MED ORDER — ACETAMINOPHEN 500 MG PO TABS
ORAL_TABLET | ORAL | Status: AC
  Administered 2018-06-11: 1000 mg via ORAL
  Filled 2018-06-11: qty 2

## 2018-06-11 MED ORDER — PHENYLEPHRINE HCL 10 % OP SOLN
OPHTHALMIC | Status: DC | PRN
  Administered 2018-06-11: 08:00:00 via TOPICAL

## 2018-06-11 MED ORDER — OXYMETAZOLINE HCL 0.05 % NA SOLN
NASAL | Status: AC
  Administered 2018-06-11: 1 via NASAL
  Filled 2018-06-11: qty 30

## 2018-06-11 MED ORDER — DEXAMETHASONE SODIUM PHOSPHATE 10 MG/ML IJ SOLN
INTRAMUSCULAR | Status: AC
  Administered 2018-06-11: 10 mg via INTRAVENOUS
  Filled 2018-06-11: qty 1

## 2018-06-11 MED ORDER — SUGAMMADEX SODIUM 500 MG/5ML IV SOLN
INTRAVENOUS | Status: DC | PRN
  Administered 2018-06-11: 150 mg via INTRAVENOUS

## 2018-06-11 MED ORDER — CEFAZOLIN SODIUM-DEXTROSE 2-4 GM/100ML-% IV SOLN
INTRAVENOUS | Status: AC
  Filled 2018-06-11: qty 100

## 2018-06-11 SURGICAL SUPPLY — 30 items
AQUAPLAST 3X3 FLAT (MISCELLANEOUS) ×2
BLADE SURG 15 STRL LF DISP TIS (BLADE) ×1 IMPLANT
BLADE SURG 15 STRL SS (BLADE) ×1
CANISTER SUCT 1200ML W/VALVE (MISCELLANEOUS) ×2 IMPLANT
COAG SUCT 10F 3.5MM HAND CTRL (MISCELLANEOUS) ×1 IMPLANT
COVER WAND RF STERILE (DRAPES) ×1 IMPLANT
ELECT REM PT RETURN 9FT ADLT (ELECTROSURGICAL) ×2
ELECTRODE REM PT RTRN 9FT ADLT (ELECTROSURGICAL) ×1 IMPLANT
GLOVE PROTEXIS LATEX SZ 7.5 (GLOVE) ×8 IMPLANT
GLOVE SURG LATEX 7.5 PF (GLOVE) ×2 IMPLANT
GOWN STRL REUS W/ TWL LRG LVL3 (GOWN DISPOSABLE) ×2 IMPLANT
GOWN STRL REUS W/TWL LRG LVL3 (GOWN DISPOSABLE) ×2
LABEL OR SOLS (LABEL) ×1 IMPLANT
NDL ANESTHESIA 27G X 3.5 (NEEDLE) ×1 IMPLANT
NEEDLE ANESTHESIA  27G X 3.5 (NEEDLE)
NEEDLE ANESTHESIA 27G X 3.5 (NEEDLE) IMPLANT
NS IRRIG 500ML POUR BTL (IV SOLUTION) ×1 IMPLANT
PACK HEAD/NECK (MISCELLANEOUS) ×2 IMPLANT
PATTIES SURGICAL .5 X3 (DISPOSABLE) ×2 IMPLANT
SPLINT AQUAPLAST 3X3 FLAT (MISCELLANEOUS) IMPLANT
SPLINT NASAL REUTER .5MM BIVLV (MISCELLANEOUS) ×2 IMPLANT
SPONGE NEURO XRAY DETECT 1X3 (DISPOSABLE) ×1 IMPLANT
STRIP CLOSURE SKIN 1/2X4 (GAUZE/BANDAGES/DRESSINGS) ×2 IMPLANT
SUT CHROMIC 3-0 (SUTURE) ×1
SUT CHROMIC 3-0 KS 27XMFL CR (SUTURE) ×1
SUT ETHILON 3-0 KS 30 BLK (SUTURE) ×2 IMPLANT
SUT PLAIN GUT 4-0 (SUTURE) ×2 IMPLANT
SUTURE CHRMC 3-0 KS 27XMFL CR (SUTURE) ×1 IMPLANT
SYR 3ML LL SCALE MARK (SYRINGE) ×2 IMPLANT
WATER STERILE IRR 1000ML POUR (IV SOLUTION) ×1 IMPLANT

## 2018-06-11 NOTE — Discharge Instructions (Signed)

## 2018-06-11 NOTE — Transfer of Care (Signed)
Immediate Anesthesia Transfer of Care Note  Patient: Blake [name]  Procedure(s) Performed: SEPTOPLASTY (Bilateral Nose) CLOSED REDUCTION NASAL FRACTURE (Bilateral Nose)  Patient Location: PACU  Anesthesia Type:General  Level of Consciousness: awake and alert   Airway & Oxygen Therapy: Patient Spontanous Breathing and Patient connected to face mask oxygen  Post-op Assessment: Report given to RN  Post vital signs: Reviewed and stable  Last Vitals:  Vitals Value Taken Time  BP    Temp    Pulse 81 06/11/2018  9:05 AM  Resp 16 06/11/2018  9:05 AM  SpO2 91 % 06/11/2018  9:05 AM  Vitals shown include unvalidated device data.  Last Pain:  Vitals:   06/11/18 0618  TempSrc: Tympanic  PainSc: 2       Patients Stated Pain Goal: 2 (06/11/18 7680)  Complications: No apparent anesthesia complications

## 2018-06-11 NOTE — OR Nursing (Signed)
Anesthesia in to speak with pt/family postop 1054 am

## 2018-06-11 NOTE — Anesthesia Postprocedure Evaluation (Signed)
Anesthesia Post Note  Patient: Toney Sang III  Procedure(s) Performed: SEPTOPLASTY (Bilateral Nose) CLOSED REDUCTION NASAL FRACTURE (Bilateral Nose)  Patient location during evaluation: PACU Anesthesia Type: General Level of consciousness: awake and alert Pain management: pain level controlled Vital Signs Assessment: post-procedure vital signs reviewed and stable Respiratory status: spontaneous breathing, nonlabored ventilation, respiratory function stable and patient connected to nasal cannula oxygen Cardiovascular status: blood pressure returned to baseline and stable Postop Assessment: no apparent nausea or vomiting Anesthetic complications: no     Last Vitals:  Vitals:   06/11/18 1011 06/11/18 1021  BP:  (!) 126/107  Pulse: 87 89  Resp: (!) 7 (!) 6  Temp:  36.4 C  SpO2: (!) 89% 93%    Last Pain:  Vitals:   06/11/18 1021  TempSrc:   PainSc: 8                  Joseph K Piscitello

## 2018-06-11 NOTE — H&P (Signed)
H&P has been reviewed and patient reevaluated, no changes necessary. To be downloaded later.  

## 2018-06-11 NOTE — Anesthesia Preprocedure Evaluation (Addendum)
Anesthesia Evaluation  Patient identified by MRN, date of birth, ID band Patient awake    Reviewed: Allergy & Precautions, H&P , NPO status , Patient's Chart, lab work & pertinent test results  History of Anesthesia Complications Negative for: history of anesthetic complications  Airway Mallampati: III  TM Distance: >3 FB Neck ROM: full    Dental  (+) Chipped, Poor Dentition   Pulmonary neg pulmonary ROS, neg shortness of breath,           Cardiovascular Exercise Tolerance: Good hypertension, (-) angina+ CAD, + Past MI and + Cardiac Stents  (-) DOE      Neuro/Psych  Headaches, PSYCHIATRIC DISORDERS CVA    GI/Hepatic Neg liver ROS, GERD  Controlled and Medicated,  Endo/Other  negative endocrine ROS  Renal/GU      Musculoskeletal  (+) Arthritis ,   Abdominal   Peds  Hematology negative hematology ROS (+)   Anesthesia Other Findings Patient has chips to teeth and trauma to lips   Past Medical History: 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 No date: Asperger's syndrome     Comment:  (per wife) 07/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 09/21/2013: CAD (coronary artery disease) 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 04/03/2016: Chronic left shoulder pain 07/11/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 03/06/2016: Cognitive deficit as late effect of traumatic brain  injury (HCC) No date: Coronary artery disease No date: Dementia (HCC) No date: Depression 07/11/2013: Disorder of male genital organs No date: Dyspnea 11/21/2014: Encounter for long-term (current) use of medications 07/11/2013: Erectile dysfunction 03/06/2016: Executive function deficit No date: GERD (gastroesophageal reflux disease) No date: Headache     Comment:  stress No date: Hearing loss No date: Hyperlipidemia No date: Hypertension 07/11/2013: Hypogonadism, 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: Hypotestosteronism No date: Injury of frontal lobe (HCC)  Comment:  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 03/06/2016: Major depression, recurrent, chronic (HCC) No date: Mild cognitive impairment     Comment:  s/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 02/02/2014: Mixed hyperlipidemia 08/2013: Myocardial infarction (HCC) No date: OCD (obsessive compulsive disorder) 04/20/2015: Other retinal detachments 07/11/2013: Other specified disorder of male genital organs(608.89) No date: Prostatic hypertrophy 04/20/2015: Pseudophakia of both eyes No date: Raynaud's disease 07/11/2013: Reduced libido No date: Repeated falls     Comment:  weak left ankle No date: Scoliosis 2/16: Stroke (HCC)     Comment:  "light" No date: Synovitis     Comment:  knees, ankles  Past Surgical History: 2011: BACK SURGERY     Comment:  rods and screws 2015: COLONOSCOPY No date: CORONARY ANGIOPLASTY     Comment:  2015 stent 1984: CYSTOSCOPY 2005,2006,2009: EYE SURGERY; Bilateral     Comment:  detached retina, cataract 05/30/2015: FOOT ARTHRODESIS; Left     Comment:  Procedure: ARTHRODESIS FOOT STJ LT FOOT;  Surgeon:               [name], DPM;  Location: [facility];                Service: Podiatry;  Laterality: Left;  WITH POPLITEAL               BLOCK No date: FOOT SURGERY; Left 1969: HERNIA REPAIR; Right     Comment:  inguinal 2004: SHOULDER SURGERY; Right No date: skull surgery 2009: TOE SURGERY; Right 2008: TONSILLECTOMY     Reproductive/Obstetrics negative OB ROS  Anesthesia Physical Anesthesia Plan  ASA: IV  Anesthesia Plan: General ETT   Post-op Pain Management:    Induction: Intravenous  PONV Risk Score and Plan: Ondansetron, Dexamethasone, Midazolam and Treatment may vary due to age or medical condition  Airway Management Planned: Oral ETT  Additional Equipment:   Intra-op Plan:   Post-operative Plan: Extubation in OR  Informed Consent: I have reviewed the patients History and Physical, chart,  labs and discussed the procedure including the risks, benefits and alternatives for the proposed anesthesia with the patient or authorized representative who has indicated his/her understanding and acceptance.     Dental Advisory Given  Plan Discussed with: Anesthesiologist, CRNA and Surgeon  Anesthesia Plan Comments: (Patient and family informed that patient is higher risk for complications from anesthesia during this procedure due to their medical history and age including but not limited to post operative cognitive dysfunction.  They voiced understanding.   Patient and wife member consented for risks of anesthesia including but not limited to:  - adverse reactions to medications - damage to teeth, lips or other oral mucosa - sore throat or hoarseness - Damage to heart, brain, lungs or loss of life  They voiced understanding.)       Anesthesia Quick Evaluation

## 2018-06-11 NOTE — Anesthesia Procedure Notes (Signed)
Procedure Name: Intubation Performed by: Lesle Reek, CRNA Pre-anesthesia Checklist: Patient identified, Emergency Drugs available, Suction available, Patient being monitored and Timeout performed Patient Re-evaluated:Patient Re-evaluated prior to induction Oxygen Delivery Method: Circle system utilized Preoxygenation: Pre-oxygenation with 100% oxygen Induction Type: IV induction Ventilation: Mask ventilation without difficulty Laryngoscope Size: Mac and 4 Grade View: Grade I Tube size: 7.5 mm Number of attempts: 1 Airway Equipment and Method: Stylet Placement Confirmation: ETT inserted through vocal cords under direct vision,  breath sounds checked- equal and bilateral,  CO2 detector and positive ETCO2 Secured at: 22 cm Tube secured with: Tape

## 2018-06-11 NOTE — Op Note (Signed)
06/11/2018  9:09 AM  [id number]   Pre-Op Dx:  Deviated Nasal Septum, Hypertrophic Inferior Turbinates , displaced nasal fracture  Post-op Dx: Same  Proc: Nasal Septoplasty, Bilateral Partial Reduction Inferior Turbinates, close reduction nasal fracture  Surg:  Blake [name] Blake [name]  Anes:  GOT  EBL: 50 mL  Comp: None  Findings: Septum markedly displaced and midportion being pushed over by the sponge pack in the left nostril.  He had a large bony spur posteriorly on the left side as well.  Right nasal bone was displaced inward and had to be outfractured.  Procedure: With the patient in a comfortable supine position,  general orotracheal anesthesia was induced without difficulty.     The patient received preoperative Afrin spray for topical decongestion and vasoconstriction.  Intravenous prophylactic antibiotics were administered.  At an appropriate level, the patient was placed in a semi-sitting position.  Nasal vibrissae were trimmed.   1% Xylocaine with 1:100,000 epinephrine, 6 cc's, was infiltrated into the anterior floor of the nose, into the nasal spine region, into the membranous columella, and finally into the submucoperichondrial plane of the septum on both sides.  Several minutes were allowed for this to take effect.  Cottoniod pledgetts soaked in Afrin and 4% Xylocaine were placed into both nasal cavities and left while the patient was prepped and draped in the standard fashion.  The materials were removed from the nose and observed to be intact and correct in number.  The nose was inspected with a headlight and zero degree scope with the findings as described above.  A left Killian incision was sharply executed and carried down to the quadrangular cartilage. The mucoperichondrium was elelvated along the quadrangular plate back to the bony-cartilaginous junction. The mucoperiostium was then elevated along the ethmoid plate and the vomer. The boney-catilaginous junction was then split  with a freer elevator and the mucoperiosteum was elevated on the opposite side. The mucoperiosteum was then elevated along the maxillary crest as needed to expose the crooked bone of the crest.  Boney spurs of the vomer and maxillary crest were removed with Lenoria Chime forceps.  The cartilaginous plate was trimmed along its posterior and inferior borders of about 2 mm of cartilage to free it up inferiorly. Some of the deviated ethmoid plate was then fractured and removed with Takahashi forceps to free up the posterior border of the quadrangular plate and allow it to swing back to the midline. The mucosal flaps were placed back into their anatomic position to allow visualization of the airways. The septum now sat in the midline with an improved airway.  A 3-0 Chromic suture on a Keith needle in used to anchor the inferior septum at the nasal spine with a through and through suture. The mucosal flaps are then sutured together using a through and through whip stitch of 4-0 Plain Gut with a mini-Keith needle. This was used to close the [place] incision as well.   The inferior turbinates were then inspected. An incision was created along the inferior aspect of the left inferior turbinate with removal of some of the inferior soft tissue and bone. Electrocautery was used to control bleeding in the area. The remaining turbinate was then outfractured to open up the airway further. There was no significant bleeding noted. The right turbinate was then trimmed and outfractured in a similar fashion.  The airways were then visualized and showed open passageways on both sides that were significantly improved compared to before surgery. There was no signifcant bleeding. Nasal  splints were applied to both sides of the septum using Xomed 0.61mm regular sized splints that were trimmed, and then held in position with a 3-0 Nylon through and through suture.  The nose was then visualized and the right nasal bone was collapsed  inward some.  The skin was cleaned with alcohol.  [place] elevator was then used for elevating the right nasal bone and pushing it back into its more normal position.  The bone stayed in place and the nose looked symmetrical and in the midline.  No packing was necessary to hold it in position.  Steri-Strips were placed over the nasal dorsum followed by an Aquaplast cast.  Tape was then placed over this to hold it in position.  The patient was turned back over to anesthesia, and awakened, extubated, and taken to the PACU in satisfactory condition.  Dispo:   PACU to home  Plan: Ice, elevation, narcotic analgesia, steroid taper, and prophylactic antibiotics for the duration of indwelling nasal foreign bodies.  We will reevaluate the patient in the office in 6 days and remove the septal splints.  Return to work in 10 days, strenuous activities in two weeks.   Blake [name] Blake [name] 06/11/2018 9:09 AM

## 2018-06-11 NOTE — Anesthesia Post-op Follow-up Note (Signed)
Anesthesia QCDR form completed.        

## 2018-06-11 NOTE — Progress Notes (Signed)
Pt has been given Fentanyl with with pain levels staying between 7 and 8 our of 10. Pt desats in to the 80's when given IV pain medication. Dr. Randa Ngo notified. Acknowledged. Orders received. Tim Wilhide E 10:21 AM 06/11/2018

## 2018-07-12 ENCOUNTER — Telehealth: Payer: Self-pay | Admitting: Urology

## 2018-07-12 DIAGNOSIS — E291 Testicular hypofunction: Secondary | ICD-10-CM

## 2018-07-12 NOTE — Telephone Encounter (Signed)
He is scheduled for tomorrow morning

## 2018-07-12 NOTE — Telephone Encounter (Signed)
Would recommend a midcycle testosterone level be checked since we changed his dose to weekly from every 2 weeks.  Order was entered.

## 2018-07-12 NOTE — Telephone Encounter (Signed)
Patient called and is asking for a refill on his testosterone medication he also wants to know if he needs to come in and get his labs drawn prior to getting his refill? Please advise   Blake [name]

## 2018-07-13 ENCOUNTER — Other Ambulatory Visit: Payer: Medicare Other

## 2018-07-13 ENCOUNTER — Other Ambulatory Visit: Payer: Self-pay

## 2018-07-13 DIAGNOSIS — E291 Testicular hypofunction: Secondary | ICD-10-CM

## 2018-07-14 ENCOUNTER — Other Ambulatory Visit: Payer: Self-pay | Admitting: Urology

## 2018-07-14 ENCOUNTER — Telehealth: Payer: Self-pay | Admitting: Urology

## 2018-07-14 ENCOUNTER — Telehealth: Payer: Self-pay | Admitting: Family Medicine

## 2018-07-14 LAB — TESTOSTERONE: Testosterone: 78 ng/dL — ABNORMAL LOW (ref 264–916)

## 2018-07-14 MED ORDER — TESTOSTERONE CYPIONATE 200 MG/ML IM SOLN: 50.0000 mg | INTRAMUSCULAR | 2 refills | Status: DC

## 2018-07-14 NOTE — Telephone Encounter (Signed)
rx sent

## 2018-07-14 NOTE — Telephone Encounter (Signed)
-----   Message from Scott C Stoioff, MD sent at 07/14/2018  2:36 PM EDT ----- Testosterone level was low however he was 1 week past due from his scheduled injection.  Testosterone refill was sent.  Recommend a repeat trough testosterone level in 6-8 weeks. 

## 2018-07-14 NOTE — Telephone Encounter (Signed)
Pt LMOM and states that his testosterone is supposed to be 50mg . He would like a call back please.

## 2018-07-14 NOTE — Telephone Encounter (Signed)
Patient notified and voiced understanding.

## 2018-07-14 NOTE — Addendum Note (Signed)
Addended by: Riki Altes on: 07/14/2018 03:02 PM   Modules accepted: Orders

## 2018-07-14 NOTE — Telephone Encounter (Signed)
Spoke to [employer] and he will review lab and send a message.

## 2018-07-14 NOTE — Telephone Encounter (Signed)
-----   Message from Riki Altes, MD sent at 07/14/2018  2:36 PM EDT ----- Testosterone level was low however he was 1 week past due from his scheduled injection.  Testosterone refill was sent.  Recommend a repeat trough testosterone level in 6-8 weeks.

## 2018-07-19 ENCOUNTER — Telehealth: Payer: Self-pay | Admitting: Family Medicine

## 2018-07-19 NOTE — Telephone Encounter (Signed)
Prior Berkley Harvey is approved from 04/20/2018-07/19/2019

## 2018-08-23 ENCOUNTER — Other Ambulatory Visit: Payer: Self-pay

## 2018-08-23 MED ORDER — TADALAFIL 20 MG PO TABS: 20.0000 mg | ORAL_TABLET | Freq: Every day | ORAL | 3 refills | Status: DC | PRN

## 2018-09-06 ENCOUNTER — Telehealth: Payer: Self-pay | Admitting: Urology

## 2018-09-06 NOTE — Telephone Encounter (Signed)
Pt called and asked, if he gives himself his weekly Testosterone injection on Monday  What day of the week is best for pt to come by and get Testosterone level check. Please advise.

## 2018-09-07 NOTE — Telephone Encounter (Signed)
Left pt mess to call 

## 2018-09-07 NOTE — Telephone Encounter (Signed)
Would do a Thursday or Friday

## 2018-09-08 NOTE — Telephone Encounter (Signed)
Patient called the office back and was advised of Dr. Heywood Footman recommendation.

## 2018-09-20 ENCOUNTER — Other Ambulatory Visit: Payer: Self-pay | Admitting: Urology

## 2018-09-20 DIAGNOSIS — N138 Other obstructive and reflux uropathy: Secondary | ICD-10-CM

## 2018-09-20 MED ORDER — TESTOSTERONE CYPIONATE 200 MG/ML IM SOLN: 50.0000 mg | INTRAMUSCULAR | 0 refills | Status: DC

## 2018-09-20 MED ORDER — TAMSULOSIN HCL 0.4 MG PO CAPS: 0.4000 mg | ORAL_CAPSULE | Freq: Two times a day (BID) | ORAL | 6 refills | Status: DC

## 2018-09-20 MED ORDER — "SYRINGE/NEEDLE (DISP) 22G X 1-1/2"" 3 ML MISC": 1 refills | Status: DC

## 2018-09-20 NOTE — Telephone Encounter (Signed)
He needs to be scheduled for a repeat trough testosterone level.  1 month testosterone supply was sent

## 2018-09-20 NOTE — Telephone Encounter (Signed)
Flomax was sent to pharmacy. Please advise on the Testosterone

## 2018-09-20 NOTE — Telephone Encounter (Signed)
Pt's wife called office and states RX for Flomax (that Medicare and [facility] has) is for once per day.  The current RX in our system says twice per day.  She states pt is completely out of meds and we are going to have to call and get this straightened out.

## 2018-09-20 NOTE — Telephone Encounter (Signed)
Pt.'s wife called and request refill for testosterone and Flomax.

## 2018-09-21 ENCOUNTER — Other Ambulatory Visit: Payer: Self-pay

## 2018-10-01 ENCOUNTER — Other Ambulatory Visit: Payer: Self-pay | Admitting: Urology

## 2018-10-01 MED ORDER — "SYRINGE/NEEDLE (DISP) 22G X 1-1/2"" 3 ML MISC": 1 refills | Status: DC

## 2018-10-01 NOTE — Telephone Encounter (Signed)
Pt needs syringes w/needles called into [facility]. He states 22 gauge/ 3cc. Please advise.

## 2018-10-04 MED ORDER — "SYRINGE/NEEDLE (DISP) 22G X 1-1/2"" 3 ML MISC": 1 refills | Status: DC

## 2018-10-04 NOTE — Telephone Encounter (Signed)
Pt called back and still needs his syringes w/needles. Please advise.

## 2018-10-04 NOTE — Telephone Encounter (Signed)
Script resent to pharmacy 

## 2018-10-04 NOTE — Addendum Note (Signed)
Addended by: Tommy Rainwater on: 10/04/2018 11:11 AM   Modules accepted: Orders

## 2018-10-11 ENCOUNTER — Other Ambulatory Visit: Payer: Self-pay

## 2018-10-11 DIAGNOSIS — E291 Testicular hypofunction: Secondary | ICD-10-CM

## 2018-10-13 ENCOUNTER — Other Ambulatory Visit: Payer: Medicare Other

## 2018-10-14 ENCOUNTER — Other Ambulatory Visit: Payer: Medicare Other

## 2018-10-18 ENCOUNTER — Other Ambulatory Visit: Payer: Self-pay

## 2018-10-18 ENCOUNTER — Other Ambulatory Visit: Payer: Medicare Other

## 2018-10-18 DIAGNOSIS — E291 Testicular hypofunction: Secondary | ICD-10-CM

## 2018-10-19 ENCOUNTER — Other Ambulatory Visit: Payer: Medicare Other

## 2018-10-19 LAB — HEMATOCRIT: Hematocrit: 42.1 % (ref 37.5–51.0)

## 2018-10-19 LAB — TESTOSTERONE: Testosterone: 404 ng/dL (ref 264–916)

## 2018-10-21 ENCOUNTER — Telehealth: Payer: Self-pay

## 2018-10-21 NOTE — Telephone Encounter (Signed)
Called patient's wife to schedule and she wanted to know if it needed to be made closer to his next testosterone draw if so when is that? Please let the patient's wife know when to schedule.  Thanks  [employer]

## 2018-10-21 NOTE — Telephone Encounter (Signed)
He is due for a follow-up appointment but can schedule next routine

## 2018-10-21 NOTE — Telephone Encounter (Signed)
-----   Message from Abbie Sons, MD sent at 10/21/2018  2:07 PM EDT ----- Testosterone level looks good at 404.  Hematocrit was 42.1.

## 2018-10-21 NOTE — Telephone Encounter (Signed)
[name] notified, she would like to know when he should follow up thanks

## 2018-10-25 NOTE — Telephone Encounter (Signed)
Patient is out of refills and did not have a follow up appointment scheduled. Is it ok to send a temporary refill until his appointment.

## 2018-10-26 MED ORDER — TESTOSTERONE CYPIONATE 200 MG/ML IM SOLN: 50.0000 mg | INTRAMUSCULAR | 0 refills | Status: DC

## 2018-10-26 NOTE — Telephone Encounter (Signed)
rx sent

## 2018-11-26 ENCOUNTER — Other Ambulatory Visit: Payer: Self-pay | Admitting: Family Medicine

## 2018-11-30 ENCOUNTER — Encounter: Payer: Self-pay | Admitting: Urology

## 2018-11-30 ENCOUNTER — Other Ambulatory Visit: Payer: Self-pay

## 2018-11-30 ENCOUNTER — Ambulatory Visit (INDEPENDENT_AMBULATORY_CARE_PROVIDER_SITE_OTHER): Payer: Medicare Other | Admitting: Urology

## 2018-11-30 VITALS — BP 121/74 | HR 73 | Ht 67.0 in | Wt 194.0 lb

## 2018-11-30 DIAGNOSIS — N401 Enlarged prostate with lower urinary tract symptoms: Secondary | ICD-10-CM | POA: Diagnosis not present

## 2018-11-30 DIAGNOSIS — E291 Testicular hypofunction: Secondary | ICD-10-CM | POA: Diagnosis not present

## 2018-11-30 NOTE — Progress Notes (Signed)
11/30/2018 1:00 PM   Blake [name] Blake [name] 1951-03-15 [id number]  Referring provider: Dorothey BasemanBronstein, David, MD [phone] [address] [place],  [place] [id number]  Chief Complaint  Patient presents with  . 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 male presents for annual follow-up.  His wife states since his last visit he has had several falls and fell in February sustaining nasal fractures which required surgery.  He has had some depression and is on antidepressants.  [facility].  He states his intercourse has not been frequent but he attempted recently and states the tadalafil 20 mg was not effective.  He has stable lower urinary tract symptoms on tamsulosin.  A testosterone level in June was 404.  His PSA has not been checked since last year.  PMH: Past Medical History:  Diagnosis Date  . 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 Asperger's syndrome    (per wife)  . 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 CAD (coronary artery disease) 09/21/2013  . 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 Chronic left shoulder pain 04/03/2016  . 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 Coronary artery disease   . Dementia (HCC)   . Depression   . Disorder of male genital organs 07/11/2013  . Dyspnea   . Encounter for long-term (current) use of medications 11/21/2014  . Erectile dysfunction 07/11/2013  . Executive function deficit 03/06/2016  . GERD (gastroesophageal reflux disease)   . Headache    stress  . Hearing loss   . Hyperlipidemia   . Hypertension   . Hypogonadism, 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  . 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  . 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 Major  depression, recurrent, chronic (HCC) 03/06/2016  . 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 Mixed hyperlipidemia 02/02/2014  . Myocardial infarction (HCC) 08/2013  . OCD (obsessive compulsive disorder)   . 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  . Other specified disorder of male genital organs(608.89) 07/11/2013  . Prostatic hypertrophy   . Pseudophakia of both eyes 04/20/2015  . Raynaud's disease   . Reduced libido 07/11/2013  . Repeated falls    weak left ankle  . Scoliosis   . Stroke (HCC) 2/16   "light"  . Synovitis    knees, ankles    Surgical History: Past Surgical History:  Procedure Laterality Date  . BACK SURGERY  2011   rods and screws  . CLOSED REDUCTION NASAL FRACTURE Bilateral 06/11/2018   Procedure: CLOSED REDUCTION NASAL FRACTURE;  Surgeon: Vernie MurdersJuengel, Paul, MD;  Location: [facility];  Service: ENT;  Laterality: Bilateral;  . COLONOSCOPY  2015  . CORONARY ANGIOPLASTY     2015 stent  . CYSTOSCOPY  1984  . EYE SURGERY Bilateral 2005,2006,2009   detached retina, cataract  . FOOT ARTHRODESIS Left 05/30/2015   Procedure: ARTHRODESIS FOOT STJ LT FOOT;  Surgeon: Gwyneth RevelsJustin Fowler, DPM;  [facility];  Service: Podiatry;  Laterality: Left;  WITH POPLITEAL BLOCK  . FOOT SURGERY Left   . HERNIA REPAIR Right 1969   inguinal  . SEPTOPLASTY Bilateral 06/11/2018   Procedure: SEPTOPLASTY;  Surgeon: Vernie MurdersJuengel, Paul, MD;  Location: [facility];  Service: ENT;  Laterality:  Bilateral;  . SHOULDER SURGERY Right 2004  . skull surgery    . TOE SURGERY Right 2009  . TONSILLECTOMY  2008    Home Medications:  Allergies as of 11/30/2018   No Known Allergies     Medication List       Accurate as of November 30, 2018  1:00 PM. If you have any questions, ask your nurse or doctor.        bimatoprost 0.01 % Soln Commonly known as: LUMIGAN Apply to eye.   calcium-vitamin D 500-200 MG-UNIT tablet Commonly known as: OSCAL WITH D Take 1 tablet by  mouth.   carvedilol 3.125 MG tablet Commonly known as: COREG Take 3.125 mg by mouth 2 (two) times daily with a meal.   clindamycin 1 % lotion Commonly known as: CLEOCIN T Apply topically 2 (two) times daily as needed.   clopidogrel 75 MG tablet Commonly known as: PLAVIX Take 75 mg by mouth daily.   dexlansoprazole 60 MG capsule Commonly known as: DEXILANT Take 60 mg by mouth daily.   Difluprednate 0.05 % Emul Apply to eye.   donepezil 10 MG tablet Commonly known as: ARICEPT Take 10 mg by mouth at bedtime.   DULoxetine 60 MG capsule Commonly known as: CYMBALTA TAKE 1 CAPSULE (60 MG TOTAL) BY MOUTH 2 (TWO) TIMES DAILY   Entresto 24-26 MG Generic drug: sacubitril-valsartan Take 1 tablet by mouth every 12 (twelve) hours.   FIBER PO Take 1 tablet by mouth 2 (two) times daily.   fluticasone 27.5 MCG/SPRAY nasal spray Commonly known as: VERAMYST Place 1 spray into the nose daily.   folic acid 1 MG tablet Commonly known as: FOLVITE Take 1 mg by mouth daily.   furosemide 20 MG tablet Commonly known as: LASIX Take 20 mg by mouth daily.   lisinopril 40 MG tablet Commonly known as: ZESTRIL Take 1 tablet (40 mg total) by mouth daily.   methotrexate 2.5 MG tablet Commonly known as: RHEUMATREX Take 20 mg by mouth once a week.   methylphenidate 10 MG tablet Commonly known as: RITALIN Take 18 mg by mouth 2 (two) times daily. Time released   prednisoLONE acetate 1 % ophthalmic suspension Commonly known as: PRED FORTE Place 1 drop into the right eye 4 (four) times daily.   predniSONE 10 MG tablet Commonly known as: DELTASONE Start with 3 pills tomorrow. Taper over the next 6 days.  3,3,2,2,1,1.   PROBIOTIC DAILY PO Take by mouth daily.   RABEprazole 20 MG tablet Commonly known as: ACIPHEX Take 20 mg by mouth daily.   SYRINGE-NEEDLE (DISP) 3 ML 22G X 1-1/2" 3 ML Misc Use as directed with testosterone   tadalafil 20 MG tablet Commonly known as: CIALIS Take 1  tablet (20 mg total) by mouth daily as needed for erectile dysfunction.   tamsulosin 0.4 MG Caps capsule Commonly known as: FLOMAX Take 1 capsule (0.4 mg total) by mouth 2 (two) times daily.   terazosin 1 MG capsule Commonly known as: HYTRIN Take 1 capsule (1 mg total) by mouth at bedtime. What changed: how much to take   testosterone cypionate 200 MG/ML injection Commonly known as: DEPOTESTOSTERONE CYPIONATE Inject 0.25 mLs (50 mg total) into the muscle once a week. Dispense four 1 mL vials for a 28-day supply   TIMOLOL MALEATE OP Apply 1 drop to eye daily. RIGHT EYE   traMADol 50 MG tablet Commonly known as: Ultram Take 1 tablet (50 mg total) by mouth every 6 (six) hours as needed.  Allergies: No Known Allergies  Family History: No family history on file.  Social History:  reports that he has never smoked. He has never used smokeless tobacco. He reports that he does not drink alcohol or use drugs.  ROS: UROLOGY Frequent Urination?: Yes Hard to postpone urination?: No Burning/pain with urination?: No Get up at night to urinate?: Yes Leakage of urine?: Yes Urine stream starts and stops?: No Trouble starting stream?: Yes Do you have to strain to urinate?: No Blood in urine?: No Urinary tract infection?: No Sexually transmitted disease?: No Injury to kidneys or bladder?: No Painful intercourse?: No Weak stream?: No Erection problems?: Yes Penile pain?: No  Gastrointestinal Nausea?: No Vomiting?: No Indigestion/heartburn?: Yes Diarrhea?: No Constipation?: No  Constitutional Fever: No Night sweats?: No Weight loss?: No Fatigue?: Yes  Skin Skin rash/lesions?: No Itching?: No  Eyes Blurred vision?: No Double vision?: No  Ears/Nose/Throat Sore throat?: No Sinus problems?: Yes  Hematologic/Lymphatic Swollen glands?: No Easy bruising?: Yes  Cardiovascular Leg swelling?: No Chest pain?: No  Respiratory Cough?: No Shortness of breath?:  Yes  Endocrine Excessive thirst?: Yes  Musculoskeletal Back pain?: No Joint pain?: Yes  Neurological Headaches?: No Dizziness?: No  Psychologic Depression?: Yes Anxiety?: Yes  Physical Exam: BP 121/74 (BP Location: Left Arm, Patient Position: Sitting, Cuff Size: Normal)   Pulse 73   Ht 5\' 7"  (1.702 m)   Wt 194 lb (88 kg)   BMI 30.38 kg/m   Constitutional:  Alert and oriented, No acute distress. HEENT: [place] AT, moist mucus membranes.  Trachea midline, no masses. Cardiovascular: No clubbing, cyanosis, or edema. Respiratory: Normal respiratory effort, no increased work of breathing. GI: Abdomen is soft, nontender, nondistended, no abdominal masses GU: Prostate 45 g, smooth without nodules Lymph: No inguinal lymphadenopathy. Skin: No rashes, bruises or suspicious lesions. Neurologic: Grossly intact, no focal deficits, moving all 4 extremities. Psychiatric: Normal mood and affect.   Assessment & Plan:    - Hypogonadism in male He is due for a testosterone injection today and will check a trough level.  PSA was also drawn.  Recent hematocrit was normal.   - Benign prostatic hyperplasia with lower urinary tract symptoms, symptom details unspecified Continue tamsulosin  -Erectile dysfunction Sexual activity has been infrequent and recently stated tadalafil was not effective.  I recommended he try several more times before determining if the medication is no longer effective.  We discussed potential factors including depression and antidepressant medication which could affect his erectile function.   Riki Altes, MD  [facility] [address], [address] [place], [place] [address] [phone]

## 2018-12-01 ENCOUNTER — Other Ambulatory Visit: Payer: Self-pay | Admitting: Family Medicine

## 2018-12-01 LAB — TESTOSTERONE: Testosterone: 357 ng/dL (ref 264–916)

## 2018-12-01 LAB — PSA: Prostate Specific Ag, Serum: 3.1 ng/mL (ref 0.0–4.0)

## 2018-12-01 MED ORDER — TESTOSTERONE CYPIONATE 200 MG/ML IM SOLN: 50.0000 mg | INTRAMUSCULAR | 2 refills | Status: DC

## 2018-12-06 ENCOUNTER — Telehealth: Payer: Self-pay

## 2018-12-06 ENCOUNTER — Encounter: Payer: Self-pay | Admitting: Urology

## 2018-12-06 NOTE — Telephone Encounter (Signed)
-----   Message from Abbie Sons, MD sent at 12/03/2018  8:45 AM EDT ----- PSA stable at 3.1.  Trough testosterone level looks good at 357

## 2018-12-06 NOTE — Telephone Encounter (Signed)
Called pt, no answer. LM for pt informing him of the information below. Advised pt to call back for questions or concerns.  

## 2019-01-31 ENCOUNTER — Encounter: Payer: Self-pay | Admitting: Urology

## 2019-01-31 ENCOUNTER — Ambulatory Visit (INDEPENDENT_AMBULATORY_CARE_PROVIDER_SITE_OTHER): Payer: Medicare Other | Admitting: Urology

## 2019-01-31 ENCOUNTER — Other Ambulatory Visit: Payer: Self-pay

## 2019-01-31 VITALS — BP 129/74 | HR 80 | Ht 67.0 in | Wt 192.0 lb

## 2019-01-31 DIAGNOSIS — N401 Enlarged prostate with lower urinary tract symptoms: Secondary | ICD-10-CM | POA: Diagnosis not present

## 2019-01-31 DIAGNOSIS — R300 Dysuria: Secondary | ICD-10-CM

## 2019-01-31 DIAGNOSIS — R339 Retention of urine, unspecified: Secondary | ICD-10-CM

## 2019-01-31 DIAGNOSIS — N138 Other obstructive and reflux uropathy: Secondary | ICD-10-CM | POA: Diagnosis not present

## 2019-01-31 LAB — URINALYSIS, COMPLETE
Bilirubin, UA: NEGATIVE
Glucose, UA: NEGATIVE
Leukocytes,UA: NEGATIVE
Nitrite, UA: NEGATIVE
RBC, UA: NEGATIVE
Specific Gravity, UA: 1.02 (ref 1.005–1.030)
Urobilinogen, Ur: 0.2 mg/dL (ref 0.2–1.0)
pH, UA: 5.5 (ref 5.0–7.5)

## 2019-01-31 LAB — MICROSCOPIC EXAMINATION
Epithelial Cells (non renal): NONE SEEN /hpf (ref 0–10)
RBC, Urine: NONE SEEN /hpf (ref 0–2)

## 2019-01-31 LAB — BLADDER SCAN AMB NON-IMAGING

## 2019-01-31 MED ORDER — TADALAFIL 5 MG PO TABS: 5.0000 mg | ORAL_TABLET | Freq: Every day | ORAL | 3 refills | Status: DC | PRN

## 2019-01-31 NOTE — Progress Notes (Signed)
01/31/2019 2:47 PM   Blake SangJohn E Slatten [name] [birth year] [id number]  Referring provider: Dorothey BasemanBronstein, David, MD [phone] [address] [place],  [place] [id number]  Chief Complaint  Patient presents with  . Urinary Retention    HPI: Blake [name] is a 68 year old male with BPH with LU TS, chronic prostatitis/chronic pelvic pain syndrome (painful ejaculation), hypogonadism and ED who presents today for dysuria with his wife, Aram BeechamCynthia.    He states that approximately 48 to 72 hours ago he had the sudden onset of dysuria.  He describes this sensation as an abrupt slow down of his urinary stream during voiding associated with discomfort.  He feels that this is different that his typical chronic prostatitis pain.  He is causing him concern as he feels it has started abruptly.    He is having associated nocturia, hesitancy, straining to urinate and a weak urinary stream.   Patient denies any gross hematuria, dysuria or suprapubic/flank pain.  Patient denies any fevers, chills, nausea or vomiting.   He is taking his tamsulosin.  His UA is negative.  His PVR is 146 mL.     PMH: Past Medical History:  Diagnosis Date  . 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 Asperger's syndrome    (per wife)  . 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 CAD (coronary artery disease) 09/21/2013  . 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 Chronic left shoulder pain 04/03/2016  . 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 Coronary artery disease   . Dementia (HCC)   . Depression   . Disorder of male genital organs 07/11/2013  . Dyspnea   . Encounter for long-term (current) use of medications 11/21/2014  . Erectile dysfunction 07/11/2013  . Executive function deficit 03/06/2016  . GERD (gastroesophageal reflux disease)   . Headache    stress  . Hearing loss   . Hyperlipidemia   . Hypertension   . Hypogonadism, 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  .  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  . 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 Major depression, recurrent, chronic (HCC) 03/06/2016  . 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 Mixed hyperlipidemia 02/02/2014  . Myocardial infarction (HCC) 08/2013  . OCD (obsessive compulsive disorder)   . 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  . Other specified disorder of male genital organs(608.89) 07/11/2013  . Prostatic hypertrophy   . Pseudophakia of both eyes 04/20/2015  . Raynaud's disease   . Reduced libido 07/11/2013  . Repeated falls    weak left ankle  . Scoliosis   . Stroke (HCC) 2/16   "light"  . Synovitis    knees, ankles    Surgical History: Past Surgical History:  Procedure Laterality Date  . BACK SURGERY  2011   rods and screws  . CLOSED REDUCTION NASAL FRACTURE Bilateral 06/11/2018   Procedure: CLOSED REDUCTION NASAL FRACTURE;  Surgeon: Vernie MurdersJuengel, Paul, MD;  Location: [facility];  Service: ENT;  Laterality: Bilateral;  . COLONOSCOPY  2015  . CORONARY ANGIOPLASTY     2015 stent  . CYSTOSCOPY  1984  . EYE SURGERY Bilateral 2005,2006,2009   detached retina, cataract  . FOOT ARTHRODESIS Left 05/30/2015   Procedure: ARTHRODESIS FOOT STJ LT FOOT;  Surgeon: Gwyneth RevelsJustin Fowler, DPM;  [facility];  Service: Podiatry;  Laterality: Left;  WITH POPLITEAL BLOCK  . FOOT SURGERY Left   .  HERNIA REPAIR Right 1969   inguinal  . SEPTOPLASTY Bilateral 06/11/2018   Procedure: SEPTOPLASTY;  Surgeon: Vernie Murders, MD;  Location: [facility];  Service: ENT;  Laterality: Bilateral;  . SHOULDER SURGERY Right 2004  . skull surgery    . TOE SURGERY Right 2009  . TONSILLECTOMY  2008    Home Medications:  Allergies as of 01/31/2019   No Known Allergies     Medication List       Accurate as of January 31, 2019 11:59 PM. If you have any questions, ask your nurse or doctor.        bimatoprost 0.01 % Soln Commonly known as: LUMIGAN Apply to eye.    calcium-vitamin D 500-200 MG-UNIT tablet Commonly known as: OSCAL WITH D Take 1 tablet by mouth.   carvedilol 3.125 MG tablet Commonly known as: COREG Take 3.125 mg by mouth 2 (two) times daily with a meal.   clindamycin 1 % lotion Commonly known as: CLEOCIN T Apply topically 2 (two) times daily as needed.   clopidogrel 75 MG tablet Commonly known as: PLAVIX Take 75 mg by mouth daily.   dexlansoprazole 60 MG capsule Commonly known as: DEXILANT Take 60 mg by mouth daily.   Difluprednate 0.05 % Emul Apply to eye.   donepezil 10 MG tablet Commonly known as: ARICEPT Take 10 mg by mouth at bedtime.   DULoxetine 60 MG capsule Commonly known as: CYMBALTA TAKE 1 CAPSULE (60 MG TOTAL) BY MOUTH 2 (TWO) TIMES DAILY   Entresto 24-26 MG Generic drug: sacubitril-valsartan Take 1 tablet by mouth every 12 (twelve) hours.   FIBER PO Take 1 tablet by mouth 2 (two) times daily.   fluticasone 27.5 MCG/SPRAY nasal spray Commonly known as: VERAMYST Place 1 spray into the nose daily.   folic acid 1 MG tablet Commonly known as: FOLVITE Take 1 mg by mouth daily.   furosemide 20 MG tablet Commonly known as: LASIX Take 20 mg by mouth daily.   lisinopril 40 MG tablet Commonly known as: ZESTRIL Take 1 tablet (40 mg total) by mouth daily.   methotrexate 2.5 MG tablet Commonly known as: RHEUMATREX Take 20 mg by mouth once a week.   methylphenidate 10 MG tablet Commonly known as: RITALIN Take 18 mg by mouth 2 (two) times daily. Time released   prednisoLONE acetate 1 % ophthalmic suspension Commonly known as: PRED FORTE Place 1 drop into the right eye 4 (four) times daily.   predniSONE 10 MG tablet Commonly known as: DELTASONE Start with 3 pills tomorrow. Taper over the next 6 days.  3,3,2,2,1,1.   PROBIOTIC DAILY PO Take by mouth daily.   RABEprazole 20 MG tablet Commonly known as: ACIPHEX Take 20 mg by mouth daily.   SYRINGE-NEEDLE (DISP) 3 ML 22G X 1-1/2" 3 ML Misc  Use as directed with testosterone   tadalafil 5 MG tablet Commonly known as: CIALIS Take 1 tablet (5 mg total) by mouth daily as needed for erectile dysfunction. What changed:   medication strength  how much to take Changed by: Bandy Honaker, PA-C   tamsulosin 0.4 MG Caps capsule Commonly known as: FLOMAX Take 1 capsule (0.4 mg total) by mouth 2 (two) times daily.   terazosin 1 MG capsule Commonly known as: HYTRIN Take 1 capsule (1 mg total) by mouth at bedtime. What changed: how much to take   testosterone cypionate 200 MG/ML injection Commonly known as: DEPOTESTOSTERONE CYPIONATE Inject 0.25 mLs (50 mg total) into the muscle once a week. Dispense  four 1 mL vials for a 28-day supply   TIMOLOL MALEATE OP Apply 1 drop to eye daily. RIGHT EYE   traMADol 50 MG tablet Commonly known as: Ultram Take 1 tablet (50 mg total) by mouth every 6 (six) hours as needed.       Allergies: No Known Allergies  Family History: No family history on file.  Social History:  reports that he has never smoked. He has never used smokeless tobacco. He reports that he does not drink alcohol or use drugs.  ROS: UROLOGY Frequent Urination?: No Hard to postpone urination?: No Burning/pain with urination?: No Get up at night to urinate?: Yes Leakage of urine?: No Urine stream starts and stops?: No Trouble starting stream?: Yes Do you have to strain to urinate?: Yes Blood in urine?: No Urinary tract infection?: No Sexually transmitted disease?: No Injury to kidneys or bladder?: No Painful intercourse?: No Weak stream?: Yes Erection problems?: Yes Penile pain?: No  Gastrointestinal Nausea?: No Vomiting?: No Indigestion/heartburn?: Yes Diarrhea?: No Constipation?: No  Constitutional Fever: No Night sweats?: Yes Weight loss?: Yes Fatigue?: Yes  Skin Skin rash/lesions?: No Itching?: No  Eyes Blurred vision?: Yes Double vision?: Yes  Ears/Nose/Throat Sore throat?: No  Sinus problems?: No  Hematologic/Lymphatic Swollen glands?: No Easy bruising?: Yes  Cardiovascular Leg swelling?: No Chest pain?: No  Respiratory Cough?: No Shortness of breath?: Yes  Endocrine Excessive thirst?: No  Musculoskeletal Back pain?: No Joint pain?: No  Neurological Headaches?: No Dizziness?: Yes  Psychologic Depression?: Yes Anxiety?: Yes  Physical Exam: BP 129/74   Pulse 80   Ht 5\' 7"  (1.702 m)   Wt 192 lb (87.1 kg)   BMI 30.07 kg/m   Constitutional:  Well nourished. Alert and oriented, No acute distress. HEENT: [place] AT, moist mucus membranes.  Trachea midline, no masses. Cardiovascular: No clubbing, cyanosis, or edema. Respiratory: Normal respiratory effort, no increased work of breathing. Neurologic: Grossly intact, no focal deficits, moving all 4 extremities. Psychiatric: Normal mood and affect.  Laboratory Data: Lab Results  Component Value Date   WBC 6.8 06/08/2018   HGB 15.1 06/08/2018   HCT 42.1 10/18/2018   MCV 95.4 06/08/2018   PLT 293 06/08/2018    Lab Results  Component Value Date   CREATININE 1.09 06/08/2018    No results found for: PSA  Lab Results  Component Value Date   TESTOSTERONE 357 11/30/2018    No results found for: HGBA1C  Lab Results  Component Value Date   TSH 3.62 09/13/2013       Component Value Date/Time   CHOL 112 05/23/2014 1014   HDL 41 05/23/2014 1014   VLDL 16 05/23/2014 1014   LDLCALC 55 05/23/2014 1014    Lab Results  Component Value Date   AST 32 01/21/2015   Lab Results  Component Value Date   ALT 27 01/21/2015   No components found for: ALKALINEPHOPHATASE No components found for: BILIRUBINTOTAL  No results found for: ESTRADIOL  Urinalysis Component     Latest Ref Rng & Units 01/31/2019  Specific Gravity, UA     1.005 - 1.030 1.020  pH, UA     5.0 - 7.5 5.5  Color, UA     Yellow Yellow  Appearance Ur     Clear Clear  Leukocytes,UA     Negative Negative  Protein,UA      Negative/Trace Trace (A)  Glucose, UA     Negative Negative  Ketones, UA     Negative Trace (A)  RBC, UA     Negative Negative  Bilirubin, UA     Negative Negative  Urobilinogen, Ur     0.2 - 1.0 mg/dL 0.2  Nitrite, UA     Negative Negative  Microscopic Examination      See below:   Component     Latest Ref Rng & Units 01/31/2019  WBC, UA     0 - 5 /hpf 0-5  RBC     0 - 2 /hpf None seen  Epithelial Cells (non renal)     0 - 10 /hpf None seen  Bacteria, UA     None seen/Few Few  I have reviewed the labs.   Pertinent Imaging: Results for DAMETRI, OZBURN (MRN [id number]) as of 01/31/2019 15:08  Ref. Range 01/31/2019 14:57  Scan Result Unknown   I have independently reviewed the films.    Assessment & Plan:    1. BPH with obstruction/lower urinary tract symptoms Continue the tamsulosin 0.4 mg daily  - BLADDER SCAN AMB NON-IMAGING - Urinalysis, Complete Will schedule a cystoscopy to evaluate for BOO I have explained to the patient that they will  be scheduled for a cystoscopy in our office to evaluate their bladder.  The cystoscopy consists of passing a tube with a lens up through their urethra and into their urinary bladder.   We will inject the urethra with a lidocaine gel prior to introducing the cystoscope to help with any discomfort during the procedure.   After the procedure, they might experience blood in the urine and discomfort with urination.  This will abate after the first few voids.  I have  encouraged the patient to increase water intake  during this time.  Patient denies any allergies to lidocaine.   2. Incomplete bladder emptying  See above    Return for Cystoscopy for BOO with Dr. Lonna Cobb .  These notes generated with voice recognition software. I apologize for typographical errors.  Michiel Cowboy, PA-C  [facility] [address]  [address] [place], [place] [address] [phone]

## 2019-02-02 LAB — CULTURE, URINE COMPREHENSIVE

## 2019-02-04 ENCOUNTER — Ambulatory Visit (INDEPENDENT_AMBULATORY_CARE_PROVIDER_SITE_OTHER): Payer: Medicare Other | Admitting: Urology

## 2019-02-04 ENCOUNTER — Other Ambulatory Visit: Payer: Self-pay

## 2019-02-04 VITALS — BP 125/87 | HR 70

## 2019-02-04 DIAGNOSIS — N401 Enlarged prostate with lower urinary tract symptoms: Secondary | ICD-10-CM

## 2019-02-04 DIAGNOSIS — N138 Other obstructive and reflux uropathy: Secondary | ICD-10-CM

## 2019-02-04 LAB — URINALYSIS, COMPLETE
Bilirubin, UA: NEGATIVE
Glucose, UA: NEGATIVE
Leukocytes,UA: NEGATIVE
Nitrite, UA: NEGATIVE
Protein,UA: NEGATIVE
RBC, UA: NEGATIVE
Specific Gravity, UA: 1.025 (ref 1.005–1.030)
Urobilinogen, Ur: 1 mg/dL (ref 0.2–1.0)
pH, UA: 6 (ref 5.0–7.5)

## 2019-02-04 LAB — MICROSCOPIC EXAMINATION
Bacteria, UA: NONE SEEN
Epithelial Cells (non renal): NONE SEEN /hpf (ref 0–10)
RBC, Urine: NONE SEEN /hpf (ref 0–2)

## 2019-02-04 MED ORDER — LIDOCAINE HCL URETHRAL/MUCOSAL 2 % EX GEL
1.0000 "application " | Freq: Once | CUTANEOUS | Status: AC
  Administered 2019-02-04: 1 via URETHRAL

## 2019-02-04 NOTE — Progress Notes (Signed)
   02/04/19  CC:  Chief Complaint  Patient presents with  . Cysto    HPI: Refer to [employer] note of 01/31/2019.  No significant change in voiding pattern.  He has been on tadalafil only 4 days.  Blood pressure 125/87, pulse 70. NED. A&Ox3.   No respiratory distress   Abd soft, NT, ND Normal phallus with bilateral descended testicles  Cystoscopy Procedure Note  Patient identification was confirmed, informed consent was obtained, and patient was prepped using Betadine solution.  Lidocaine jelly was administered per urethral meatus.     Pre-Procedure: - Inspection reveals a normal caliber urethral meatus.  Procedure: The flexible cystoscope was introduced without difficulty - No urethral strictures/lesions are present. - Mild lateral lobe enlargement prostate  - Moderate elevation bladder neck/median lobe - Bilateral ureteral orifices identified - Bladder mucosa  reveals no ulcers, tumors, or lesions - No bladder stones - No trabeculation  Retroflexion shows no intravesical median lobe, back bleeding from prostate   Post-Procedure: - Patient tolerated the procedure well  Assessment/ Plan: Inflammatory changes prostate and moderate median lobe.  I recommended he continue his tamsulosin and tadalafil.  Call back in 30 days regarding efficacy of the addition of tadalafil.  If voiding symptoms persist would recommend UDS prior to outlet surgery   Abbie Sons, MD

## 2019-02-06 ENCOUNTER — Encounter: Payer: Self-pay | Admitting: Urology

## 2019-02-11 ENCOUNTER — Other Ambulatory Visit: Payer: Self-pay

## 2019-02-11 DIAGNOSIS — E291 Testicular hypofunction: Secondary | ICD-10-CM

## 2019-02-11 NOTE — Telephone Encounter (Signed)
Pt requests refill, they are leaving to be out of town on Monday

## 2019-02-13 MED ORDER — TESTOSTERONE CYPIONATE 200 MG/ML IM SOLN: 50.0000 mg | INTRAMUSCULAR | 2 refills | Status: DC

## 2019-02-18 ENCOUNTER — Emergency Department: Payer: Medicare Other

## 2019-02-18 ENCOUNTER — Encounter: Payer: Self-pay | Admitting: Emergency Medicine

## 2019-02-18 ENCOUNTER — Emergency Department
Admission: EM | Admit: 2019-02-18 | Discharge: 2019-02-18 | Disposition: A | Discharge status: Home / Self Care | Payer: Medicare Other | Attending: Emergency Medicine | Admitting: Emergency Medicine

## 2019-02-18 ENCOUNTER — Other Ambulatory Visit: Payer: Self-pay

## 2019-02-18 DIAGNOSIS — R2241 Localized swelling, mass and lump, right lower limb: Secondary | ICD-10-CM | POA: Insufficient documentation

## 2019-02-18 DIAGNOSIS — Y999 Unspecified external cause status: Secondary | ICD-10-CM | POA: Insufficient documentation

## 2019-02-18 DIAGNOSIS — Y93H2 Activity, gardening and landscaping: Secondary | ICD-10-CM | POA: Diagnosis not present

## 2019-02-18 DIAGNOSIS — Z79899 Other long term (current) drug therapy: Secondary | ICD-10-CM | POA: Diagnosis not present

## 2019-02-18 DIAGNOSIS — Y92017 Garden or yard in single-family (private) house as the place of occurrence of the external cause: Secondary | ICD-10-CM | POA: Insufficient documentation

## 2019-02-18 DIAGNOSIS — M7989 Other specified soft tissue disorders: Secondary | ICD-10-CM

## 2019-02-18 DIAGNOSIS — S93401A Sprain of unspecified ligament of right ankle, initial encounter: Secondary | ICD-10-CM | POA: Diagnosis not present

## 2019-02-18 DIAGNOSIS — W010XXA Fall on same level from slipping, tripping and stumbling without subsequent striking against object, initial encounter: Secondary | ICD-10-CM | POA: Insufficient documentation

## 2019-02-18 DIAGNOSIS — I10 Essential (primary) hypertension: Secondary | ICD-10-CM | POA: Insufficient documentation

## 2019-02-18 DIAGNOSIS — I2510 Atherosclerotic heart disease of native coronary artery without angina pectoris: Secondary | ICD-10-CM | POA: Insufficient documentation

## 2019-02-18 NOTE — ED Notes (Signed)
See triage note  Presents with pain to right foot/ankle  States he fell about 5 days ago  States his foot gave out at that time  Was able to get up and limp to chair  Now states he noticed some bruising to lateral aspect of foot with some swelling

## 2019-02-18 NOTE — Discharge Instructions (Signed)
You were seen today for right leg and ankle swelling and pain. Your xray is negative for fracture. The ultrasound of your right leg is negative for DVT. We have wrapped in ACE wrap to control swelling. Encourage elevation to help reduce swelling. Follow up with PCP in 1 week as previous scheduled.

## 2019-02-18 NOTE — ED Triage Notes (Signed)
C/O becoming "lame" to right leg on Saturday.  States was working out in the yard and rolled right foot and c/o right foot and ankle pain.  Right foot right bruising noted.  Spouse states last night right foot began to swell.  + DP palpable

## 2019-02-18 NOTE — ED Provider Notes (Signed)
[facility] Emergency Department Provider Note ____________________________________________  Time seen: 1715  I have reviewed the triage vital signs and the nursing notes.  HISTORY  Chief Complaint  No chief complaint on file.   HPI Blake [name] is a 68 y.o. male presents to the ER today with complaint of right lower extremity swelling and right ankle pain.  He reports last Saturday he rolled his ankle while working out in the yard.  He noticed swelling, pain and bruising at that time.  He applied ice, elevated his leg and rested with good relief of symptoms.  He reports sudden onset of right leg swelling last night.  He denies any redness or warmth.  He reports most of the pain is still in his right ankle.  He is able to ambulate with some difficulty.  He has a history of CHF but no cough, shortness of breath or swelling in the left leg at this time.  He has no history of DVT in the past.  He is currently on aspirin and Plavix.  Past Medical History:  Diagnosis Date  . 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 Asperger's syndrome    (per wife)  . 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 CAD (coronary artery disease) 09/21/2013  . 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 Chronic left shoulder pain 04/03/2016  . 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 Coronary artery disease   . Dementia (HCC)   . Depression   . Disorder of male genital organs 07/11/2013  . Dyspnea   . Encounter for long-term (current) use of medications 11/21/2014  . Erectile dysfunction 07/11/2013  . Executive function deficit 03/06/2016  . GERD (gastroesophageal reflux disease)   . Headache    stress  . Hearing loss   . Hyperlipidemia   . Hypertension   . Hypogonadism, 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  . 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  . 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 Major depression,  recurrent, chronic (HCC) 03/06/2016  . 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 Mixed hyperlipidemia 02/02/2014  . Myocardial infarction (HCC) 08/2013  . OCD (obsessive compulsive disorder)   . 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  . Other specified disorder of male genital organs(608.89) 07/11/2013  . Prostatic hypertrophy   . Pseudophakia of both eyes 04/20/2015  . Raynaud's disease   . Reduced libido 07/11/2013  . Repeated falls    weak left ankle  . Scoliosis   . Stroke (HCC) 2/16   "light"  . Synovitis    knees, ankles    Patient Active Problem List   Diagnosis Date Noted  . 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 Chronic left shoulder pain 04/03/2016  . 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 Executive function deficit 03/06/2016  . Major depression, recurrent, chronic (HCC) 03/06/2016  . 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  . Pseudophakia of both eyes 04/20/2015  . Encounter for long-term (current) use of medications 11/21/2014  . Hypertension 02/02/2014  . Mixed hyperlipidemia 02/02/2014  . CAD (coronary artery disease) 09/21/2013  . 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 Reduced libido 07/11/2013  . Disorder of male genital organs 07/11/2013  . Erectile dysfunction  07/11/2013  . Hypogonadism, 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  . Other specified disorder of male genital organs(608.89) 07/11/2013  . GERD (gastroesophageal reflux disease) 01/11/2013    Past Surgical History:  Procedure Laterality Date  . BACK SURGERY  2011   rods and screws  . CLOSED REDUCTION NASAL FRACTURE Bilateral 06/11/2018   Procedure: CLOSED REDUCTION NASAL FRACTURE;  Surgeon: Vernie MurdersJuengel, Paul, MD;  Location: [facility] ORS;  Service: ENT;  Laterality: Bilateral;  .  COLONOSCOPY  2015  . CORONARY ANGIOPLASTY     2015 stent  . CYSTOSCOPY  1984  . EYE SURGERY Bilateral 2005,2006,2009   detached retina, cataract  . FOOT ARTHRODESIS Left 05/30/2015   Procedure: ARTHRODESIS FOOT STJ LT FOOT;  Surgeon: Gwyneth RevelsJustin Fowler, DPM;  [facility];  Service: Podiatry;  Laterality: Left;  WITH POPLITEAL BLOCK  . FOOT SURGERY Left   . HERNIA REPAIR Right 1969   inguinal  . SEPTOPLASTY Bilateral 06/11/2018   Procedure: SEPTOPLASTY;  Surgeon: Vernie MurdersJuengel, Paul, MD;  Location: [facility] ORS;  Service: ENT;  Laterality: Bilateral;  . SHOULDER SURGERY Right 2004  . skull surgery    . TOE SURGERY Right 2009  . TONSILLECTOMY  2008    Prior to Admission medications   Medication Sig Start Date End Date Taking? Authorizing Provider  bimatoprost (LUMIGAN) 0.01 % SOLN Apply to eye.    [provider]  calcium-vitamin D (OSCAL WITH D) 500-200 MG-UNIT tablet Take 1 tablet by mouth.    [provider]  carvedilol (COREG) 3.125 MG tablet Take 3.125 mg by mouth 2 (two) times daily with a meal. 11/03/18   [provider]  clindamycin (CLEOCIN T) 1 % lotion Apply topically 2 (two) times daily as needed.    [provider]  clopidogrel (PLAVIX) 75 MG tablet Take 75 mg by mouth daily. 09/27/18   [provider]  dexlansoprazole (DEXILANT) 60 MG capsule Take 60 mg by mouth daily. 05/15/15   [provider]  Difluprednate 0.05 % EMUL Apply to eye.    [provider]  donepezil (ARICEPT) 10 MG tablet Take 10 mg by mouth at bedtime.    [provider]  DULoxetine (CYMBALTA) 60 MG capsule TAKE 1 CAPSULE (60 MG TOTAL) BY MOUTH 2 (TWO) TIMES DAILY 11/17/18   [provider]  ENTRESTO 24-26 MG Take 1 tablet by mouth every 12 (twelve) hours. 11/01/18   [provider]  FIBER PO Take 1 tablet by mouth 2 (two) times daily.     [provider]  fluticasone (VERAMYST) 27.5 MCG/SPRAY nasal spray Place 1  spray into the nose daily.    [provider]  folic acid (FOLVITE) 1 MG tablet Take 1 mg by mouth daily.    [provider]  furosemide (LASIX) 20 MG tablet Take 20 mg by mouth daily. 11/03/18   [provider]  lisinopril (PRINIVIL,ZESTRIL) 40 MG tablet Take 1 tablet (40 mg total) by mouth daily. 03/19/16 06/08/18  Cuthriell, Delorise RoyalsJonathan D, PA-C  methotrexate (RHEUMATREX) 2.5 MG tablet Take 20 mg by mouth once a week.     [provider]  methylphenidate (RITALIN) 10 MG tablet Take 18 mg by mouth 2 (two) times daily. Time released    [provider]  prednisoLONE acetate (PRED FORTE) 1 % ophthalmic suspension Place 1 drop into the right eye 4 (four) times daily.    [provider]  Probiotic Product (PROBIOTIC DAILY PO) Take by mouth daily.    [provider]  RABEprazole (ACIPHEX)  20 MG tablet Take 20 mg by mouth daily.    [provider]  SYRINGE-NEEDLE, DISP, 3 ML 22G X 1-1/2" 3 ML MISC Use as directed with testosterone 10/04/18   Stoioff, Ronda Fairly, MD  tadalafil (CIALIS) 5 MG tablet Take 1 tablet (5 mg total) by mouth daily as needed for erectile dysfunction. 01/31/19   Zara Council A, PA-C  tamsulosin (FLOMAX) 0.4 MG CAPS capsule Take 1 capsule (0.4 mg total) by mouth 2 (two) times daily. 09/20/18   Stoioff, Ronda Fairly, MD  terazosin (HYTRIN) 1 MG capsule Take 1 capsule (1 mg total) by mouth at bedtime. Patient taking differently: Take 5 mg by mouth at bedtime.  02/12/17   Stoioff, Ronda Fairly, MD  testosterone cypionate (DEPOTESTOSTERONE CYPIONATE) 200 MG/ML injection Inject 0.25 mLs (50 mg total) into the muscle once a week. Dispense four 1 mL vials for a 28-day supply 02/13/19   Stoioff, Ronda Fairly, MD  TIMOLOL MALEATE OP Apply 1 drop to eye daily. RIGHT EYE    [provider]  traMADol (ULTRAM) 50 MG tablet Take 1 tablet (50 mg total) by mouth every 6 (six) hours as needed. 06/05/18 06/05/19  Lavonia Drafts, MD     Allergies Patient has no known allergies.  No family history on file.  Social History Social History   Tobacco Use  . Smoking status: Never Smoker  . Smokeless tobacco: Never Used  Substance Use Topics  . Alcohol use: No  . Drug use: No    Review of Systems  Constitutional: Negative for fever, chills or body aches. Cardiovascular: Positive for swelling of the right lower extremity.  Negative for chest pain or chest tightness. Respiratory: Negative for cough or shortness of breath. Musculoskeletal: Positive for right ankle pain and swelling, difficulty with gait.  Negative for right knee pain or swelling.. Skin: Positive for bruising.  Negative for redness or warmth. Neurological: Negative for focal weakness, tingling or numbness. ____________________________________________  PHYSICAL EXAM:  VITAL SIGNS: ED Triage Vitals  Enc Vitals Group     BP 02/18/19 1655 130/81     Pulse Rate 02/18/19 1655 84     Resp 02/18/19 1655 16     Temp 02/18/19 1655 98 F (36.7 C)     Temp Source 02/18/19 1655 Oral     SpO2 02/18/19 1655 98 %     Weight 02/18/19 1651 192 lb 0.3 oz (87.1 kg)     Height 02/18/19 1651 5' 7.5" (1.715 m)     Head Circumference --      Peak Flow --      Pain Score 02/18/19 1650 1     Pain Loc --      Pain Edu? --      Excl. in [place]? --     Constitutional: Alert and oriented. Well appearing and in no distress. Cardiovascular: Normal rate, regular rhythm.  Pedal pulses 2+ bilaterally.  1+ pitting right lower extremity edema. Respiratory: Normal respiratory effort. No wheezes/rales/rhonchi. Musculoskeletal: Normal flexion and extension of the right knee.  No pain with palpation of the right knee.  Normal flexion, extension and rotation of the right ankle, but with pain.  1+ swelling of the left medial malleolus.  Pain with palpation posterior to the lateral and medial malleoli.  Gait not visualized. Neurologic: Sensation intact to BLE. Skin: Bruising noted  of the medial foot inferior to the medial malleolus. Psychiatric: Mood and affect are normal. Patient exhibits appropriate insight and judgment. ____________________________________________   RADIOLOGY  Imaging Orders     DG Ankle Complete Right     US Venous Img Lower Unilateral Right  ____________________________________________   INITIAL IMPRESSION / ASSESSMENT AND PLAN / ED COURSE  Right Leg Swelling, Right Ankle Pain and Swelling:  Xray of right ankle negative for acute fracture Discussed RICE therapy Ultrasound right leg negative for DVT I thought we could increase Lasix to 20 mg BID x 3 days to help with swelling but wife reports he was on 20 mg BID and became hypotensive. ACE wrap to RLE to help compression Encouraged elevation Has follow up with PCP in 1 week already scheduled    ____________________________________________  FINAL CLINICAL IMPRESSION(S) / ED DIAGNOSES  Final diagnoses:  Right leg swelling  Sprain of right ankle, unspecified ligament, initial encounter   Nicki Reaper, NP    Lorre Munroe, NP 02/18/19 [place]    Concha Se, MD 02/19/19 [phone]

## 2019-04-07 ENCOUNTER — Other Ambulatory Visit: Payer: Self-pay | Admitting: Urology

## 2019-04-07 DIAGNOSIS — N138 Other obstructive and reflux uropathy: Secondary | ICD-10-CM

## 2019-05-21 ENCOUNTER — Other Ambulatory Visit: Payer: Self-pay | Admitting: Urology

## 2019-05-23 ENCOUNTER — Other Ambulatory Visit
Admission: RE | Admit: 2019-05-23 | Discharge: 2019-05-23 | Disposition: A | Discharge status: Home / Self Care | Payer: Medicare Other | Source: Ambulatory Visit | Attending: Internal Medicine | Admitting: Internal Medicine

## 2019-05-23 DIAGNOSIS — Z01812 Encounter for preprocedural laboratory examination: Secondary | ICD-10-CM | POA: Diagnosis present

## 2019-05-23 LAB — BRAIN NATRIURETIC PEPTIDE: B Natriuretic Peptide: 62 pg/mL (ref 0.0–100.0)

## 2019-06-03 ENCOUNTER — Other Ambulatory Visit: Payer: Self-pay

## 2019-06-03 ENCOUNTER — Other Ambulatory Visit
Admission: RE | Admit: 2019-06-03 | Discharge: 2019-06-03 | Disposition: A | Discharge status: Home / Self Care | Payer: Medicare Other | Source: Ambulatory Visit | Attending: Internal Medicine | Admitting: Internal Medicine

## 2019-06-03 ENCOUNTER — Other Ambulatory Visit: Payer: Self-pay | Admitting: Family Medicine

## 2019-06-03 DIAGNOSIS — Z20822 Contact with and (suspected) exposure to covid-19: Secondary | ICD-10-CM | POA: Insufficient documentation

## 2019-06-03 DIAGNOSIS — Z01812 Encounter for preprocedural laboratory examination: Secondary | ICD-10-CM | POA: Insufficient documentation

## 2019-06-03 DIAGNOSIS — E291 Testicular hypofunction: Secondary | ICD-10-CM

## 2019-06-03 LAB — SARS CORONAVIRUS 2 (TAT 6-24 HRS): SARS Coronavirus 2: NEGATIVE

## 2019-06-06 ENCOUNTER — Other Ambulatory Visit: Payer: Self-pay

## 2019-06-06 ENCOUNTER — Other Ambulatory Visit: Payer: Self-pay | Admitting: Urology

## 2019-06-06 ENCOUNTER — Other Ambulatory Visit: Payer: Medicare Other

## 2019-06-06 DIAGNOSIS — E291 Testicular hypofunction: Secondary | ICD-10-CM

## 2019-06-07 LAB — TESTOSTERONE: Testosterone: 368 ng/dL (ref 264–916)

## 2019-06-08 ENCOUNTER — Encounter
Admission: RE | Disposition: A | Discharge status: Home / Self Care | Payer: Self-pay | Source: Home / Self Care | Attending: Internal Medicine

## 2019-06-08 ENCOUNTER — Observation Stay
Admission: RE | Admit: 2019-06-08 | Discharge: 2019-06-09 | Disposition: A | Discharge status: Home / Self Care | Payer: Medicare Other | Attending: Internal Medicine | Admitting: Internal Medicine

## 2019-06-08 ENCOUNTER — Encounter: Payer: Self-pay | Admitting: Internal Medicine

## 2019-06-08 ENCOUNTER — Other Ambulatory Visit: Payer: Self-pay

## 2019-06-08 ENCOUNTER — Telehealth: Payer: Self-pay | Admitting: *Deleted

## 2019-06-08 DIAGNOSIS — Z79899 Other long term (current) drug therapy: Secondary | ICD-10-CM | POA: Diagnosis not present

## 2019-06-08 DIAGNOSIS — I2510 Atherosclerotic heart disease of native coronary artery without angina pectoris: Secondary | ICD-10-CM | POA: Diagnosis not present

## 2019-06-08 DIAGNOSIS — Z8249 Family history of ischemic heart disease and other diseases of the circulatory system: Secondary | ICD-10-CM | POA: Diagnosis not present

## 2019-06-08 DIAGNOSIS — R0602 Shortness of breath: Secondary | ICD-10-CM | POA: Insufficient documentation

## 2019-06-08 DIAGNOSIS — I110 Hypertensive heart disease with heart failure: Secondary | ICD-10-CM | POA: Insufficient documentation

## 2019-06-08 DIAGNOSIS — K219 Gastro-esophageal reflux disease without esophagitis: Secondary | ICD-10-CM | POA: Diagnosis not present

## 2019-06-08 DIAGNOSIS — I252 Old myocardial infarction: Secondary | ICD-10-CM | POA: Diagnosis not present

## 2019-06-08 DIAGNOSIS — Z7902 Long term (current) use of antithrombotics/antiplatelets: Secondary | ICD-10-CM | POA: Diagnosis not present

## 2019-06-08 DIAGNOSIS — I5022 Chronic systolic (congestive) heart failure: Secondary | ICD-10-CM | POA: Diagnosis not present

## 2019-06-08 DIAGNOSIS — E7849 Other hyperlipidemia: Secondary | ICD-10-CM | POA: Insufficient documentation

## 2019-06-08 DIAGNOSIS — N400 Enlarged prostate without lower urinary tract symptoms: Secondary | ICD-10-CM | POA: Diagnosis not present

## 2019-06-08 DIAGNOSIS — Z955 Presence of coronary angioplasty implant and graft: Secondary | ICD-10-CM

## 2019-06-08 DIAGNOSIS — Z7982 Long term (current) use of aspirin: Secondary | ICD-10-CM | POA: Insufficient documentation

## 2019-06-08 DIAGNOSIS — R079 Chest pain, unspecified: Secondary | ICD-10-CM | POA: Diagnosis present

## 2019-06-08 DIAGNOSIS — H9193 Unspecified hearing loss, bilateral: Secondary | ICD-10-CM | POA: Diagnosis not present

## 2019-06-08 DIAGNOSIS — F339 Major depressive disorder, recurrent, unspecified: Secondary | ICD-10-CM | POA: Insufficient documentation

## 2019-06-08 DIAGNOSIS — Z8673 Personal history of transient ischemic attack (TIA), and cerebral infarction without residual deficits: Secondary | ICD-10-CM | POA: Insufficient documentation

## 2019-06-08 DIAGNOSIS — I429 Cardiomyopathy, unspecified: Secondary | ICD-10-CM | POA: Diagnosis not present

## 2019-06-08 HISTORY — PX: CORONARY STENT INTERVENTION: CATH118234

## 2019-06-08 HISTORY — PX: LEFT HEART CATH AND CORONARY ANGIOGRAPHY: CATH118249

## 2019-06-08 LAB — POCT ACTIVATED CLOTTING TIME: Activated Clotting Time: 428 seconds

## 2019-06-08 SURGERY — LEFT HEART CATH AND CORONARY ANGIOGRAPHY
Anesthesia: Moderate Sedation

## 2019-06-08 MED ORDER — DORZOLAMIDE HCL 2 % OP SOLN
1.0000 [drp] | Freq: Two times a day (BID) | OPHTHALMIC | Status: DC
  Administered 2019-06-08 – 2019-06-09 (×2): 1 [drp] via OPHTHALMIC
  Filled 2019-06-08: qty 10

## 2019-06-08 MED ORDER — ALPRAZOLAM 0.5 MG PO TABS: 1.0000 mg | ORAL_TABLET | Freq: Two times a day (BID) | ORAL | Status: DC | PRN

## 2019-06-08 MED ORDER — SODIUM CHLORIDE 0.9% FLUSH
3.0000 mL | Freq: Two times a day (BID) | INTRAVENOUS | Status: DC
  Administered 2019-06-08: 3 mL via INTRAVENOUS

## 2019-06-08 MED ORDER — SODIUM CHLORIDE 0.9 % IV SOLN: 250.0000 mL | INTRAVENOUS | Status: DC | PRN

## 2019-06-08 MED ORDER — HEPARIN (PORCINE) IN NACL 1000-0.9 UT/500ML-% IV SOLN
INTRAVENOUS | Status: DC | PRN
  Administered 2019-06-08: 500 mL

## 2019-06-08 MED ORDER — ONDANSETRON HCL 4 MG/2ML IJ SOLN: 4.0000 mg | Freq: Four times a day (QID) | INTRAMUSCULAR | Status: DC | PRN

## 2019-06-08 MED ORDER — HYDRALAZINE HCL 20 MG/ML IJ SOLN: 10.0000 mg | INTRAMUSCULAR | Status: AC | PRN

## 2019-06-08 MED ORDER — CLINDAMYCIN PHOSPHATE 1 % EX LOTN: 1.0000 "application " | TOPICAL_LOTION | Freq: Two times a day (BID) | CUTANEOUS | Status: DC | PRN

## 2019-06-08 MED ORDER — NITROGLYCERIN 0.4 MG SL SUBL
SUBLINGUAL_TABLET | SUBLINGUAL | Status: AC
  Filled 2019-06-08: qty 1

## 2019-06-08 MED ORDER — FLUTICASONE PROPIONATE 50 MCG/ACT NA SUSP
2.0000 | Freq: Every day | NASAL | Status: DC
  Filled 2019-06-08: qty 16

## 2019-06-08 MED ORDER — TIMOLOL MALEATE 0.5 % OP SOLN
1.0000 [drp] | Freq: Two times a day (BID) | OPHTHALMIC | Status: DC
  Administered 2019-06-08 – 2019-06-09 (×2): 1 [drp] via OPHTHALMIC
  Filled 2019-06-08: qty 5

## 2019-06-08 MED ORDER — CLOPIDOGREL BISULFATE 75 MG PO TABS
ORAL_TABLET | ORAL | Status: DC | PRN
  Administered 2019-06-08: 300 mg via ORAL

## 2019-06-08 MED ORDER — FENTANYL CITRATE (PF) 100 MCG/2ML IJ SOLN
INTRAMUSCULAR | Status: AC
  Filled 2019-06-08: qty 2

## 2019-06-08 MED ORDER — BIVALIRUDIN BOLUS VIA INFUSION - CUPID
INTRAVENOUS | Status: DC | PRN
  Administered 2019-06-08: 64.275 mg via INTRAVENOUS

## 2019-06-08 MED ORDER — FUROSEMIDE 10 MG/ML IJ SOLN
INTRAMUSCULAR | Status: AC
  Filled 2019-06-08: qty 4

## 2019-06-08 MED ORDER — BIVALIRUDIN TRIFLUOROACETATE 250 MG IV SOLR
INTRAVENOUS | Status: AC
  Filled 2019-06-08: qty 250

## 2019-06-08 MED ORDER — CLOPIDOGREL BISULFATE 75 MG PO TABS
ORAL_TABLET | ORAL | Status: AC
  Filled 2019-06-08: qty 8

## 2019-06-08 MED ORDER — MIDAZOLAM HCL 2 MG/2ML IJ SOLN
INTRAMUSCULAR | Status: AC
  Filled 2019-06-08: qty 2

## 2019-06-08 MED ORDER — CYCLOPENTOLATE HCL 2 % OP SOLN
1.0000 [drp] | Freq: Every day | OPHTHALMIC | Status: DC
  Administered 2019-06-08 – 2019-06-09 (×2): 1 [drp] via OPHTHALMIC
  Filled 2019-06-08: qty 2

## 2019-06-08 MED ORDER — MIDAZOLAM HCL 2 MG/2ML IJ SOLN
INTRAMUSCULAR | Status: DC | PRN
  Administered 2019-06-08 (×4): 0.5 mg via INTRAVENOUS

## 2019-06-08 MED ORDER — HEPARIN (PORCINE) IN NACL 1000-0.9 UT/500ML-% IV SOLN
INTRAVENOUS | Status: AC
  Filled 2019-06-08: qty 1000

## 2019-06-08 MED ORDER — FOLIC ACID 1 MG PO TABS
1.0000 mg | ORAL_TABLET | Freq: Every day | ORAL | Status: DC
  Administered 2019-06-09: 1 mg via ORAL
  Filled 2019-06-08: qty 1

## 2019-06-08 MED ORDER — CLOPIDOGREL BISULFATE 75 MG PO TABS
75.0000 mg | ORAL_TABLET | Freq: Every day | ORAL | Status: DC
  Administered 2019-06-09: 09:00:00 75 mg via ORAL
  Filled 2019-06-08: qty 1

## 2019-06-08 MED ORDER — IOHEXOL 300 MG/ML  SOLN
INTRAMUSCULAR | Status: DC | PRN
  Administered 2019-06-08: 300 mL

## 2019-06-08 MED ORDER — PANTOPRAZOLE SODIUM 40 MG PO TBEC
40.0000 mg | DELAYED_RELEASE_TABLET | Freq: Two times a day (BID) | ORAL | Status: DC
  Administered 2019-06-08 – 2019-06-09 (×2): 40 mg via ORAL
  Filled 2019-06-08 (×2): qty 1

## 2019-06-08 MED ORDER — BRIMONIDINE TARTRATE-TIMOLOL 0.2-0.5 % OP SOLN
1.0000 [drp] | Freq: Two times a day (BID) | OPHTHALMIC | Status: DC
  Filled 2019-06-08: qty 5

## 2019-06-08 MED ORDER — LABETALOL HCL 5 MG/ML IV SOLN: 10.0000 mg | INTRAVENOUS | Status: AC | PRN

## 2019-06-08 MED ORDER — SODIUM CHLORIDE 0.9 % WEIGHT BASED INFUSION: 1.0000 mL/kg/h | INTRAVENOUS | Status: DC

## 2019-06-08 MED ORDER — DULOXETINE HCL 30 MG PO CPEP
60.0000 mg | ORAL_CAPSULE | Freq: Two times a day (BID) | ORAL | Status: DC
  Administered 2019-06-09 (×2): 60 mg via ORAL
  Filled 2019-06-08 (×2): qty 2

## 2019-06-08 MED ORDER — SODIUM CHLORIDE 0.9% FLUSH: 3.0000 mL | INTRAVENOUS | Status: DC | PRN

## 2019-06-08 MED ORDER — ASPIRIN 81 MG PO CHEW
81.0000 mg | CHEWABLE_TABLET | ORAL | Status: AC
  Administered 2019-06-08: 13:00:00 81 mg via ORAL

## 2019-06-08 MED ORDER — SODIUM CHLORIDE 0.9 % WEIGHT BASED INFUSION
3.0000 mL/kg/h | INTRAVENOUS | Status: AC
  Administered 2019-06-08: 13:00:00 3 mL/kg/h via INTRAVENOUS

## 2019-06-08 MED ORDER — TAMSULOSIN HCL 0.4 MG PO CAPS
0.8000 mg | ORAL_CAPSULE | Freq: Every day | ORAL | Status: DC
  Administered 2019-06-09: 0.8 mg via ORAL
  Filled 2019-06-08: qty 2

## 2019-06-08 MED ORDER — SODIUM CHLORIDE 0.9% FLUSH
3.0000 mL | Freq: Two times a day (BID) | INTRAVENOUS | Status: DC
  Administered 2019-06-08: 22:00:00 3 mL via INTRAVENOUS

## 2019-06-08 MED ORDER — BRIMONIDINE TARTRATE 0.2 % OP SOLN
1.0000 [drp] | Freq: Two times a day (BID) | OPHTHALMIC | Status: DC
  Administered 2019-06-08 – 2019-06-09 (×2): 1 [drp] via OPHTHALMIC
  Filled 2019-06-08: qty 5

## 2019-06-08 MED ORDER — CARVEDILOL 3.125 MG PO TABS
3.1250 mg | ORAL_TABLET | Freq: Two times a day (BID) | ORAL | Status: DC
  Administered 2019-06-09: 3.125 mg via ORAL
  Filled 2019-06-08: qty 1

## 2019-06-08 MED ORDER — FUROSEMIDE 10 MG/ML IJ SOLN
INTRAMUSCULAR | Status: DC | PRN
  Administered 2019-06-08: 40 mg via INTRAVENOUS

## 2019-06-08 MED ORDER — FENTANYL CITRATE (PF) 100 MCG/2ML IJ SOLN
INTRAMUSCULAR | Status: DC | PRN
  Administered 2019-06-08 (×4): 25 ug via INTRAVENOUS

## 2019-06-08 MED ORDER — FUROSEMIDE 40 MG PO TABS
40.0000 mg | ORAL_TABLET | Freq: Every day | ORAL | Status: DC
  Administered 2019-06-09: 09:00:00 40 mg via ORAL
  Filled 2019-06-08: qty 1

## 2019-06-08 MED ORDER — NITROGLYCERIN 0.4 MG SL SUBL
SUBLINGUAL_TABLET | SUBLINGUAL | Status: DC | PRN
  Administered 2019-06-08: .4 mg via SUBLINGUAL

## 2019-06-08 MED ORDER — SACUBITRIL-VALSARTAN 24-26 MG PO TABS
1.0000 | ORAL_TABLET | Freq: Two times a day (BID) | ORAL | Status: DC
  Administered 2019-06-08 – 2019-06-09 (×2): 1 via ORAL
  Filled 2019-06-08 (×2): qty 1

## 2019-06-08 MED ORDER — SODIUM CHLORIDE 0.9 % IV SOLN
INTRAVENOUS | Status: DC | PRN
  Administered 2019-06-08: 1.75 mg/kg/h via INTRAVENOUS

## 2019-06-08 MED ORDER — ASPIRIN 81 MG PO CHEW
CHEWABLE_TABLET | ORAL | Status: AC
  Filled 2019-06-08: qty 1

## 2019-06-08 MED ORDER — ASPIRIN 81 MG PO CHEW
CHEWABLE_TABLET | ORAL | Status: DC | PRN
  Administered 2019-06-08: 243 mg via ORAL

## 2019-06-08 MED ORDER — ACETAMINOPHEN 325 MG PO TABS: 650.0000 mg | ORAL_TABLET | ORAL | Status: DC | PRN

## 2019-06-08 MED ORDER — ASPIRIN 81 MG PO CHEW
81.0000 mg | CHEWABLE_TABLET | Freq: Every day | ORAL | Status: DC
  Administered 2019-06-09: 81 mg via ORAL
  Filled 2019-06-08: qty 1

## 2019-06-08 MED ORDER — ASPIRIN 81 MG PO CHEW
CHEWABLE_TABLET | ORAL | Status: AC
  Filled 2019-06-08: qty 3

## 2019-06-08 MED ORDER — OXYCODONE HCL 5 MG PO TABS: 5.0000 mg | ORAL_TABLET | ORAL | Status: DC | PRN

## 2019-06-08 SURGICAL SUPPLY — 23 items
BALLN TREK RX 2.5X15 (BALLOONS) ×3
BALLN ~~LOC~~ TREK RX 3.25X12 (BALLOONS) ×3
BALLOON TREK RX 2.5X15 (BALLOONS) ×2 IMPLANT
BALLOON ~~LOC~~ TREK RX 3.25X12 (BALLOONS) ×2 IMPLANT
CATH INFINITI 5FR ANG PIGTAIL (CATHETERS) ×3 IMPLANT
CATH INFINITI 5FR JL4 (CATHETERS) ×3 IMPLANT
CATH INFINITI 5FR JL5 (CATHETERS) ×3 IMPLANT
CATH INFINITI JR4 5F (CATHETERS) ×3 IMPLANT
CATH SWANZ 7F THERMO (CATHETERS) ×3 IMPLANT
CATH VISTA GUIDE 6FR XB3.5 SH (CATHETERS) ×3 IMPLANT
DEVICE CLOSURE MYNXGRIP 6/7F (Vascular Products) ×6 IMPLANT
DEVICE INFLAT 30 PLUS (MISCELLANEOUS) ×3 IMPLANT
DEVICE SAFEGUARD 24CM (GAUZE/BANDAGES/DRESSINGS) ×3 IMPLANT
GUIDEWIRE EMER 3M J .025X150CM (WIRE) ×3 IMPLANT
KIT MANI 3VAL PERCEP (MISCELLANEOUS) ×3 IMPLANT
NEEDLE PERC 18GX7CM (NEEDLE) ×3 IMPLANT
PACK CARDIAC CATH (CUSTOM PROCEDURE TRAY) ×3 IMPLANT
SHEATH AVANTI 5FR X 11CM (SHEATH) ×3 IMPLANT
SHEATH AVANTI 6FR X 11CM (SHEATH) ×3 IMPLANT
SHEATH AVANTI 7FRX11 (SHEATH) ×3 IMPLANT
STENT RESOLUTE ONYX 3.0X15 (Permanent Stent) ×3 IMPLANT
WIRE G HI TQ BMW 190 (WIRE) ×3 IMPLANT
WIRE GUIDERIGHT .035X150 (WIRE) ×3 IMPLANT

## 2019-06-08 NOTE — Telephone Encounter (Signed)
Notified patient as instructed, patient pleased. Discussed follow-up appointments, patient agrees  

## 2019-06-08 NOTE — Telephone Encounter (Signed)
-----   Message from Riki Altes, MD sent at 06/07/2019  6:01 PM EST ----- T level was 368

## 2019-06-08 NOTE — Progress Notes (Signed)
Patient admitted to 2a 245.  No complaints post cath.  R femoral access. PAD in place with 40cc of air in place.  Bruising noted around site.  Positive pulses.  Wife at bedside. Call bell in reach.

## 2019-06-09 ENCOUNTER — Encounter: Payer: Self-pay | Admitting: Cardiology

## 2019-06-09 DIAGNOSIS — I2510 Atherosclerotic heart disease of native coronary artery without angina pectoris: Secondary | ICD-10-CM | POA: Diagnosis not present

## 2019-06-09 NOTE — Progress Notes (Signed)
[facility]    SUBJECTIVE: Patient states to be doing reasonably well reduced shortness of breath no significant chest pain groin area appears to be healing well with no significant hematoma.  Patient is ambulating in the room well and feels reasonably ready to go home   Vitals:   06/08/19 1600 06/08/19 1737 06/08/19 1932 06/09/19 0556  BP: 106/82 110/70 90/66 120/82  Pulse: 81 82 84 71  Resp:   18 18  Temp:  97.7 F (36.5 C) 97.6 F (36.4 C) 98 F (36.7 C)  TempSrc:  Oral Oral Oral  SpO2: 97% 100% 99% 96%  Weight:      Height:         Intake/Output Summary (Last 24 hours) at 06/09/2019 7867 Last data filed at 06/09/2019 0305 Gross per 24 hour  Intake 0 ml  Output 1600 ml  Net -1600 ml      PHYSICAL EXAM  General: Well developed, well nourished, in no acute distress HEENT:  Normocephalic and atramatic Neck:  No JVD.  Lungs: Clear bilaterally to auscultation and percussion. Heart: HRRR . Normal S1 and S2 without gallops or murmurs.  Abdomen: Bowel sounds are positive, abdomen soft and non-tender  Msk:  Back normal, normal gait. Normal strength and tone for age. Extremities: No clubbing, cyanosis or edema.   Neuro: Alert and oriented X 3. Psych:  Good affect, responds appropriately   LABS: Basic Metabolic Panel: No results for input(s): NA, K, CL, CO2, GLUCOSE, BUN, CREATININE, CALCIUM, MG, PHOS in the last 72 hours. Liver Function Tests: No results for input(s): AST, ALT, ALKPHOS, BILITOT, PROT, ALBUMIN in the last 72 hours. No results for input(s): LIPASE, AMYLASE in the last 72 hours. CBC: No results for input(s): WBC, NEUTROABS, HGB, HCT, MCV, PLT in the last 72 hours. Cardiac Enzymes: No results for input(s): CKTOTAL, CKMB, CKMBINDEX, TROPONINI in the last 72 hours. BNP: Invalid input(s): POCBNP D-Dimer: No results for input(s): DDIMER in the last 72 hours. Hemoglobin A1C: No results for input(s): HGBA1C in the last 72 hours. Fasting Lipid Panel: No  results for input(s): CHOL, HDL, LDLCALC, TRIG, CHOLHDL, LDLDIRECT in the last 72 hours. Thyroid Function Tests: No results for input(s): TSH, T4TOTAL, T3FREE, THYROIDAB in the last 72 hours.  Invalid input(s): FREET3 Anemia Panel: No results for input(s): VITAMINB12, FOLATE, FERRITIN, TIBC, IRON, RETICCTPCT in the last 72 hours.  CARDIAC CATHETERIZATION  Result Date: 06/08/2019  RPDA lesion is 50% stenosed.  Prox LAD lesion is 75% stenosed.  Ost Cx to Mid Cx lesion is 10% stenosed.  Moderate to severely depressed left ventricular function globally around 35%  Successful PCI and stent of proximal LAD with DES stent  Proximal LAD lesion was reduced from 75 down to 0%  Normal right heart pressures  Conclusion Normal right heart pressures Mean wedge of 8 mean PA of 16 Cardiac output 6.83 Fick Moderate to severely depressed overall left ventricular function around 35% Severe single-vessel coronary disease Left main minor irregularities short LAD large with 75% proximal lesion diagonal 1 with a 50% ostial lesion Circumflex large proximal widely patent stent minor irregularities distally RCA relatively large with a 50 to 75% PDA lesion distally Mixed dominant system Intervention Successful ad hoc intervention of proximal LAD lesion Deployment is a 3.0 x 15 mm DES stent resolute Onyx Postdilated with 3.25 x 12 mm [place] trek 13 atm TIMI-3 flow was maintained throughout the procedure Minx was deployed in the right femoral artery     Echo none on  this admission and will consider outpatient procedure  TELEMETRY: Normal sinus rhythm nonspecific ST-T wave changes  ASSESSMENT AND PLAN:  Active Problems:   S/P insertion of non-drug eluting coronary artery stent Unstable angina Coronary artery disease Congestive heart failure Cardiomyopathy Hypertension Shortness of breath Bilateral leg edema Hyperlipidemia . Plan Continue medical therapy Maintain aspirin Plavix statin therapy Continue Lasix  for heart failure and dyspnea Agree with hypertension management and control No heavy lifting for 1 to 2 weeks Continue Entresto Coreg diuretics for heart failure Continue inhalers and eyedrops as necessary Increase activity with mild aerobic exercise with walking or biking       Yolonda Kida, MD 06/09/2019 6:55 AM

## 2019-06-09 NOTE — Discharge Summary (Signed)
Patient appears to be doing reasonably well denies any pain improved shortness of breath tolerating medications well Denies any significant groin pain Patient ambulating in the halls well No significant change in medications Ready for discharge Status post PCI and stent of DES to LAD

## 2019-06-09 NOTE — Progress Notes (Signed)
Patient discharged to home. Tele and IV d/c'd.  Verbalizes understanding of discharge instructions. 

## 2019-06-09 NOTE — Discharge Instructions (Signed)
Patient is did not have any significant heavy lifting continue current medications have the patient follow-up with me in 1 to 2 weeks

## 2019-07-01 ENCOUNTER — Other Ambulatory Visit: Payer: Self-pay

## 2019-07-01 DIAGNOSIS — E291 Testicular hypofunction: Secondary | ICD-10-CM

## 2019-07-03 MED ORDER — TESTOSTERONE CYPIONATE 200 MG/ML IM SOLN: INTRAMUSCULAR | 2 refills | Status: DC

## 2019-08-10 ENCOUNTER — Telehealth: Payer: Self-pay | Admitting: Urology

## 2019-08-10 NOTE — Telephone Encounter (Signed)
Pt left vm he was interested in a penis implant he was given information packet about this before and he would like to receive another  pamlette about this matter.

## 2019-08-10 NOTE — Telephone Encounter (Signed)
No vm set up to inform pt I send him a Pamphlet  As requested per pt vm for Penis Implant

## 2019-08-10 NOTE — Telephone Encounter (Signed)
He can stop by the office and pick one up .

## 2019-08-16 ENCOUNTER — Telehealth: Payer: Self-pay

## 2019-08-16 ENCOUNTER — Other Ambulatory Visit: Payer: Self-pay | Admitting: Urology

## 2019-08-16 NOTE — Telephone Encounter (Signed)
The literature we have is lacking in valuable information.  Would recommend he go to the [facility] website ([email or web address]) and go to Vacuum Erection Devices under the Our Products tab.

## 2019-08-16 NOTE — Telephone Encounter (Signed)
Blake [name] called this afternoon inquiring about a penis pump or a device similar to it. Wanting to know if we have any literature we could send him. Please advise.

## 2019-08-17 ENCOUNTER — Emergency Department
Admission: EM | Admit: 2019-08-17 | Discharge: 2019-08-17 | Disposition: A | Discharge status: Home / Self Care | Payer: Medicare Other | Attending: Emergency Medicine | Admitting: Emergency Medicine

## 2019-08-17 ENCOUNTER — Emergency Department: Payer: Medicare Other

## 2019-08-17 ENCOUNTER — Other Ambulatory Visit: Payer: Self-pay

## 2019-08-17 DIAGNOSIS — F0390 Unspecified dementia without behavioral disturbance: Secondary | ICD-10-CM | POA: Insufficient documentation

## 2019-08-17 DIAGNOSIS — Z7901 Long term (current) use of anticoagulants: Secondary | ICD-10-CM | POA: Diagnosis not present

## 2019-08-17 DIAGNOSIS — Z79899 Other long term (current) drug therapy: Secondary | ICD-10-CM | POA: Insufficient documentation

## 2019-08-17 DIAGNOSIS — I10 Essential (primary) hypertension: Secondary | ICD-10-CM | POA: Insufficient documentation

## 2019-08-17 DIAGNOSIS — I2510 Atherosclerotic heart disease of native coronary artery without angina pectoris: Secondary | ICD-10-CM | POA: Insufficient documentation

## 2019-08-17 DIAGNOSIS — Z711 Person with feared health complaint in whom no diagnosis is made: Secondary | ICD-10-CM | POA: Diagnosis not present

## 2019-08-17 DIAGNOSIS — Z043 Encounter for examination and observation following other accident: Secondary | ICD-10-CM | POA: Diagnosis not present

## 2019-08-17 DIAGNOSIS — M795 Residual foreign body in soft tissue: Secondary | ICD-10-CM

## 2019-08-17 DIAGNOSIS — Z7982 Long term (current) use of aspirin: Secondary | ICD-10-CM | POA: Diagnosis not present

## 2019-08-17 NOTE — Telephone Encounter (Signed)
Continued documentation from previous note; After patients wife informed us that there was a broke off needle in each leg, we then instructed patients wife that Mr Sangiovanni should be seen in the ED as soon as possible. Patients wife verbalized understanding.

## 2019-08-17 NOTE — ED Notes (Signed)
See triage note. Pt here for bilateral needles that may be stuck in upper legs. Pt in no distress. Family with pt.

## 2019-08-17 NOTE — ED Triage Notes (Signed)
Pt comes via POV from home with c/o possible foreign body in bilateral upper legs. Pt states he gives himself injections and this am he was doing that when he thinks he heard the needle break off and may still be in his leg.  Pt states one injection IM and other subcutaneous. Pt unsure if needle stay inside leg or not. Pt states when he withdrew the syringe he heard a snap and thought it fell onto floor.  Pt states he administered the second injection subcu and heard same noise. Pt states he looked and no needle was there. Pt states he could feel that needle still in his leg.

## 2019-08-17 NOTE — Telephone Encounter (Signed)
Contacted patient as advised. While on the phone with patients wife, she informed us that Blake [name] was injecting himself with his testosterone injection, while doing so the needle broke off in his leg. Patients wife states the prescription for the testosterone needles was written correctly for 22G, however the pharmacy gave him 25G needles because they were out. The pharmacy failed to let the patient know until after the incident when the patients wife was inquiring about why they had ended up with 25G needles instead of 22G. Patients wife then goes on to inform us that after breaking the first needle off in his leg, he then switched to the other leg with another injectable medication (methotrexate) and that needle broke off in his other leg as well.

## 2019-08-20 NOTE — ED Provider Notes (Signed)
[facility] Emergency Department Provider Note  ____________________________________________  Time seen: Approximately 4:00 PM  I have reviewed the triage vital signs and the nursing notes.   HISTORY  Chief Complaint foreign body   HPI Blake [name] is a 69 y.o. male presenting to the emergency department for evaluation after giving himself his prescribed injectable medications and noticing that the needles from both syringes were gone after medication was injected. He reports hearing a snap, but could not find either needle and is concerned that they may be in his legs.   Past Medical History:  Diagnosis Date  . 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 Asperger's syndrome    (per wife)  . 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 CAD (coronary artery disease) 09/21/2013  . 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 Chronic left shoulder pain 04/03/2016  . 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 Coronary artery disease   . Dementia (HCC)   . Depression   . Disorder of male genital organs 07/11/2013  . Dyspnea   . Encounter for long-term (current) use of medications 11/21/2014  . Erectile dysfunction 07/11/2013  . Executive function deficit 03/06/2016  . GERD (gastroesophageal reflux disease)   . Headache    stress  . Hearing loss   . Hyperlipidemia   . Hypertension   . Hypogonadism, 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  . 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  . 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 Major depression, recurrent, chronic (HCC) 03/06/2016  . 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 Mixed hyperlipidemia 02/02/2014  . Myocardial infarction (HCC) 08/2013  . OCD (obsessive compulsive disorder)   . 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  . Other specified disorder of male genital organs(608.89) 07/11/2013  . Prostatic hypertrophy   .  Pseudophakia of both eyes 04/20/2015  . Raynaud's disease   . Reduced libido 07/11/2013  . Repeated falls    weak left ankle  . Scoliosis   . Stroke (HCC) 2/16   "light"  . Synovitis    knees, ankles    Patient Active Problem List   Diagnosis Date Noted  . S/P insertion of non-drug eluting coronary artery stent 06/08/2019  . 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 Chronic left shoulder pain 04/03/2016  . 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 Executive function deficit 03/06/2016  . Major depression, recurrent, chronic (HCC) 03/06/2016  . 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  . Pseudophakia of both eyes 04/20/2015  . Encounter for long-term (current) use of medications 11/21/2014  . Hypertension 02/02/2014  . Mixed hyperlipidemia 02/02/2014  . CAD (coronary artery disease) 09/21/2013  . 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 Reduced libido 07/11/2013  . Disorder of male genital organs 07/11/2013  . Erectile dysfunction 07/11/2013  . Hypogonadism, 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  . Other specified disorder of male genital organs(608.89) 07/11/2013  . GERD (gastroesophageal reflux disease) 01/11/2013    Past Surgical History:  Procedure Laterality Date  . BACK SURGERY  2011   rods and screws  . CLOSED REDUCTION NASAL FRACTURE Bilateral 06/11/2018   Procedure: CLOSED REDUCTION NASAL FRACTURE;  Surgeon: Vernie Murders, MD;  Location: [facility] ORS;  Service: ENT;  Laterality: Bilateral;  .  COLONOSCOPY  2015  . CORONARY ANGIOPLASTY     2015 stent  . CORONARY STENT INTERVENTION N/A 06/08/2019   Procedure: CORONARY STENT INTERVENTION;  Surgeon: Alwyn Pea, MD;  Location: [facility];  Service: Cardiovascular;  Laterality: N/A;  . CYSTOSCOPY  1984  . EYE SURGERY  Bilateral 2005,2006,2009   detached retina, cataract  . FOOT ARTHRODESIS Left 05/30/2015   Procedure: ARTHRODESIS FOOT STJ LT FOOT;  Surgeon: [name], DPM;  Location: [facility];  Service: Podiatry;  Laterality: Left;  WITH POPLITEAL BLOCK  . FOOT SURGERY Left   . HERNIA REPAIR Right 1969   inguinal  . LEFT HEART CATH AND CORONARY ANGIOGRAPHY Left 06/08/2019   Procedure: LEFT HEART CATH AND CORONARY ANGIOGRAPHY;  Surgeon: Alwyn Pea, MD;  Location: [facility];  Service: Cardiovascular;  Laterality: Left;  . SEPTOPLASTY Bilateral 06/11/2018   Procedure: SEPTOPLASTY;  Surgeon: Vernie Murders, MD;  Location: [facility] OR[facility];  Service: ENT;  Laterality: Bilateral;  . SHOULDER SURGERY Right 2004  . skull surgery    . TOE SURGERY Right 2009  . TONSILLECTOMY  2008    Prior to Admission medications   Medication Sig Start Date End Date Taking? Authorizing Provider  acetaminophen (TYLENOL) 500 MG tablet Take 1,000 mg by mouth every 6 (six) hours as needed for mild pain or moderate pain.    [provider]  aspirin EC 81 MG tablet Take 81 mg by mouth daily.    [provider]  B Complex-C (SUPER B COMPLEX PO) Take 1 tablet by mouth daily.    [provider]  brimonidine-timolol (COMBIGAN) 0.2-0.5 % ophthalmic solution Place 1 drop into the right eye every 12 (twelve) hours.    [provider]  Calcium Carbonate-Vitamin D 600-200 MG-UNIT CAPS Take 1 tablet by mouth 2 (two) times daily.     [provider]  carvedilol (COREG) 3.125 MG tablet Take 3.125 mg by mouth daily.  11/03/18   [provider]  clindamycin (CLEOCIN T) 1 % lotion Apply 1 application topically 2 (two) times daily as needed.     [provider]  clopidogrel (PLAVIX) 75 MG tablet Take 75 mg by mouth daily. 09/27/18   [provider]  cyclopentolate (CYCLODRYL,CYCLOGYL) 1 % ophthalmic solution Place 1 drop into the right eye daily. 04/05/19    [provider]  dexlansoprazole (DEXILANT) 60 MG capsule Take 60 mg by mouth every evening.  05/15/15   [provider]  dorzolamide (TRUSOPT) 2 % ophthalmic solution Place 1 drop into the right eye 2 (two) times daily.  05/24/19   [provider]  DULoxetine (CYMBALTA) 60 MG capsule Take 60 mg by mouth 2 (two) times daily.  11/17/18   [provider]  ENTRESTO 24-26 MG Take 1 tablet by mouth every 12 (twelve) hours. 11/01/18   [provider]  FIBER PO Take 1 tablet by mouth 2 (two) times daily.     [provider]  folic acid (FOLVITE) 1 MG tablet Take 1 mg by mouth daily.    [provider]  furosemide (LASIX) 20 MG tablet Take 20 mg by mouth daily. 11/03/18   [provider]  lisinopril (PRINIVIL,ZESTRIL) 40 MG tablet Take 1 tablet (40 mg total) by mouth daily. 03/19/16 06/06/19  Cuthriell, Delorise Royals, PA-C  Melatonin 5 MG TABS Take 5 mg by mouth at bedtime. 30 min before bed    [provider]  methotrexate 50 MG/2ML injection Inject  0.8 mg into the skin once a week. 05/25/19   [provider]  nitroGLYCERIN (NITROSTAT) 0.4 MG SL tablet Place 0.4 mg under the tongue every 2 (two) hours as needed. 05/25/19   [provider]  Probiotic Product (PROBIOTIC DAILY PO) Take 1 tablet by mouth daily. Senior    [provider]  SYRINGE-NEEDLE, DISP, 3 ML 22G X 1-1/2" 3 ML MISC Use as directed with testosterone 10/04/18   Stoioff, Ronda Fairly, MD  tadalafil (CIALIS) 5 MG tablet TAKE ONE TABLET BY MOUTH DAILY AS NEEDED FOR ERECTILE DYSFUNCTION Patient taking differently: Take 5 mg by mouth daily.  05/23/19   Zara Council A, PA-C  tamsulosin (FLOMAX) 0.4 MG CAPS capsule TAKE 1 CAPSULE (0.4 MG TOTAL) BY MOUTH 2 (TWO) TIMES DAILY. 04/07/19   Stoioff, Ronda Fairly, MD  terazosin (HYTRIN) 1 MG capsule Take 1 capsule (1 mg total) by mouth at bedtime. Patient not taking: Reported on 06/06/2019 02/12/17   Abbie Sons, MD   testosterone cypionate (DEPOTESTOSTERONE CYPIONATE) 200 MG/ML injection INJECT 0.25 MLS (50 MG TOTAL) INTO THE MUSCLE ONCE A WEEK. 07/03/19   Stoioff, Ronda Fairly, MD    Allergies Patient has no known allergies.  No family history on file.  Social History Social History   Tobacco Use  . Smoking status: Never Smoker  . Smokeless tobacco: Never Used  Substance Use Topics  . Alcohol use: No  . Drug use: No    Review of Systems Constitutional: Negative for fever. ENT: Negative for sore throat. Respiratory: Negative for cough or shortness of breath. Gastrointestinal: No abdominal pain.  No nausea, no vomiting.  No diarrhea.  Musculoskeletal: Negative for generalized body aches. Skin: Negative for rash/lesion/wound. Neurological: Negative for headaches, focal weakness or numbness.  ____________________________________________   PHYSICAL EXAM:  VITAL SIGNS: ED Triage Vitals  Enc Vitals Group     BP 08/17/19 1238 108/68     Pulse Rate 08/17/19 1238 79     Resp 08/17/19 1238 18     Temp 08/17/19 1238 98 F (36.7 C)     Temp Source 08/17/19 1603 Oral     SpO2 08/17/19 1238 100 %     Weight 08/17/19 1238 183 lb (83 kg)     Height 08/17/19 1238 5\' 7"  (1.702 m)     Head Circumference --      Peak Flow --      Pain Score 08/17/19 1238 0     Pain Loc --      Pain Edu? --      Excl. in [place]? --     Constitutional: Alert and oriented. Well appearing and in no acute distress. Eyes: Conjunctivae are normal. PERRL. EOMI. Head: Atraumatic. Nose: No congestion/rhinnorhea. Mouth/Throat: Mucous membranes are moist. Neck: No stridor.  Cardiovascular: Normal rate, regular rhythm. Good peripheral circulation. Respiratory: Normal respiratory effort. Musculoskeletal: Full ROM throughout.  Neurologic:  Normal speech and language. No gross focal neurologic deficits are appreciated. Speech is normal. No gait instability. Skin:  Skin is warm, dry and intact. No rash noted. Psychiatric: Mood  and affect are normal. Speech and behavior are normal.  ____________________________________________   LABS (all labs ordered are listed, but only abnormal results are displayed)  Labs Reviewed - No data to display ____________________________________________  EKG  Not indicated ____________________________________________  RADIOLOGY  Images of both femurs do not show retained radiopaque foreign bodies within the soft tissues. ____________________________________________   PROCEDURES  None indicated. ____________________________________________   INITIAL IMPRESSION / ASSESSMENT  AND PLAN / ED COURSE   No retained foreign bodies on imaging. Wife called the pharmacy to ask if the syringes have needles that retract after injection. They do. Patient states that he did notice they were in different packaging, but didn't think to read specific details.  Pertinent labs & imaging results that were available during my care of the patient were reviewed by me and considered in my medical decision making (see chart for details).  ____________________________________________   FINAL CLINICAL IMPRESSION(S) / ED DIAGNOSES  Final diagnoses:  No problem, feared complaint unfounded       Chinita Pester, FNP 08/20/19 [id number]    Phineas Semen, MD 08/21/19 [phone]

## 2019-08-23 ENCOUNTER — Other Ambulatory Visit: Payer: Self-pay | Admitting: Urology

## 2019-10-13 ENCOUNTER — Other Ambulatory Visit: Payer: Self-pay | Admitting: Urology

## 2019-10-18 ENCOUNTER — Other Ambulatory Visit: Payer: Self-pay | Admitting: *Deleted

## 2019-10-18 DIAGNOSIS — E291 Testicular hypofunction: Secondary | ICD-10-CM

## 2019-10-21 MED ORDER — TESTOSTERONE CYPIONATE 200 MG/ML IM SOLN: INTRAMUSCULAR | 2 refills | Status: DC

## 2019-12-05 ENCOUNTER — Ambulatory Visit: Payer: Medicare Other | Admitting: Urology

## 2019-12-06 NOTE — Progress Notes (Signed)
12/07/2019 2:05 PM   Blake [name] Sep 01, 1950 [id number]  Referring provider: Dorothey Baseman, MD [phone] [place],  [place] [address] Chief Complaint  Patient presents with   Hypogonadism   Urologic problem list: -BPH with lower urinary tract symptoms -Chronic prostatitis/chronic pelvic pain syndrome(painful ejaculation) -Hypogonadism -Erectile dysfunction  HPI: Blake [name] [name] is a 69 y.o. male with BPH with LUTS, chronic prostatitis/chronic pelvic pain syndrome (painful ejaculation), hypogonadism and ED who presents today follow up. Patient is accompanied by his wife.   -Does feel voiding symptoms have improved 70% with the addition of daily tadalafil -Remains on tamsulosin  -He remains on tamsulosin and tadalafil 5 mg.  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   Coronary artery disease    Dementia (HCC)    Depression    Disorder of male genital organs 07/11/2013   Dyspnea    Encounter for long-term (current) use of medications 11/21/2014   Erectile dysfunction 07/11/2013   Executive function deficit 03/06/2016   GERD (gastroesophageal reflux disease)    Headache    stress   Hearing loss    Hyperlipidemia    Hypertension    Hypogonadism, 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   Major depression, recurrent, chronic (HCC) 03/06/2016   Mild cognitive impairment    s/p 2  accidents with frontal lobe injury   Mixed hyperlipidemia 02/02/2014   Myocardial infarction (HCC) 08/2013   OCD (obsessive compulsive disorder)    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   Other specified disorder of male genital organs(608.89) 07/11/2013   Prostatic hypertrophy    Pseudophakia of both eyes 04/20/2015   Raynaud's disease    Reduced libido 07/11/2013   Repeated falls    weak left ankle   Scoliosis    Stroke (HCC) 2/16   "light"   Synovitis    knees, ankles    Surgical History: Past Surgical History:  Procedure Laterality Date   BACK SURGERY  2011   rods and screws   CLOSED REDUCTION NASAL FRACTURE Bilateral 06/11/2018   Procedure: CLOSED REDUCTION NASAL FRACTURE;  Surgeon: Vernie Murders, MD;  Location: [facility];  Service: ENT;  Laterality: Bilateral;   COLONOSCOPY  2015   CORONARY ANGIOPLASTY     2015 stent   CORONARY STENT INTERVENTION N/A 06/08/2019   Procedure: CORONARY STENT INTERVENTION;  Surgeon: Alwyn Pea, MD;  Location: [facility];  Service: Cardiovascular;  Laterality: N/A;   CYSTOSCOPY  1984   EYE SURGERY Bilateral 2005,2006,2009   detached retina, cataract   FOOT ARTHRODESIS Left 05/30/2015   Procedure: ARTHRODESIS FOOT STJ LT FOOT;  Surgeon: [name], DPM;  Location: [facility];  Service: Podiatry;  Laterality: Left;  WITH POPLITEAL BLOCK   FOOT SURGERY Left    HERNIA REPAIR Right 1969   inguinal   LEFT HEART CATH AND CORONARY ANGIOGRAPHY Left 06/08/2019   Procedure: LEFT HEART CATH AND CORONARY  ANGIOGRAPHY;  Surgeon: Alwyn Pea, MD;  Location: [facility];  Service: Cardiovascular;  Laterality: Left;   SEPTOPLASTY Bilateral 06/11/2018   Procedure: SEPTOPLASTY;  Surgeon: Vernie Murders, MD;  Location: [facility];  Service: ENT;  Laterality: Bilateral;   SHOULDER SURGERY Right 2004   skull surgery     TOE SURGERY Right 2009   TONSILLECTOMY  2008    Home Medications:  Allergies  as of 12/07/2019      Reactions   Mirtazapine Other (See Comments)   Other reaction(s): Other (See Comments) Irritability/anger, increased appetite, worsening depression Irritability/anger, increased appetite, worsening depression      Medication List       Accurate as of December 07, 2019  2:05 PM. If you have any questions, ask your nurse or doctor.        STOP taking these medications   Combigan 0.2-0.5 % ophthalmic solution Generic drug: brimonidine-timolol Stopped by: Riki Altes, MD   cyclopentolate 1 % ophthalmic solution Commonly known as: CYCLODRYL,CYCLOGYL Stopped by: Riki Altes, MD   dorzolamide 2 % ophthalmic solution Commonly known as: TRUSOPT Stopped by: Riki Altes, MD     TAKE these medications   acetaminophen 500 MG tablet Commonly known as: TYLENOL Take 1,000 mg by mouth every 6 (six) hours as needed for mild pain or moderate pain.   aspirin EC 81 MG tablet Take 81 mg by mouth daily.   Calcium Carbonate-Vitamin D 600-200 MG-UNIT Caps Take 1 tablet by mouth 2 (two) times daily.   carvedilol 3.125 MG tablet Commonly known as: COREG Take 3.125 mg by mouth daily.   clindamycin 1 % lotion Commonly known as: CLEOCIN T Apply 1 application topically 2 (two) times daily as needed.   clopidogrel 75 MG tablet Commonly known as: PLAVIX Take 75 mg by mouth daily.   dexlansoprazole 60 MG capsule Commonly known as: DEXILANT Take 60 mg by mouth every evening.   DULoxetine 60 MG capsule Commonly known as: CYMBALTA Take 60 mg by mouth 2 (two) times daily.   Entresto 24-26 MG Generic drug: sacubitril-valsartan Take 1 tablet by mouth every 12 (twelve) hours.   FIBER PO Take 1 tablet by mouth 2 (two) times daily.   folic acid 1 MG tablet Commonly known as: FOLVITE Take 1 mg by mouth daily.   furosemide 20 MG tablet Commonly known as: LASIX Take 20 mg by mouth daily.   lisinopril 40 MG tablet Commonly known as: ZESTRIL Take 1 tablet  (40 mg total) by mouth daily.   melatonin 5 MG Tabs Take 5 mg by mouth at bedtime. 30 min before bed   methotrexate 50 MG/2ML injection Inject 0.8 mg into the skin once a week.   nitroGLYCERIN 0.4 MG SL tablet Commonly known as: NITROSTAT Place 0.4 mg under the tongue every 2 (two) hours as needed.   PROBIOTIC DAILY PO Take 1 tablet by mouth daily. Senior   SUPER B COMPLEX PO Take 1 tablet by mouth daily.   SYRINGE-NEEDLE (DISP) 3 ML 22G X 1-1/2" 3 ML Misc Use as directed with testosterone   tadalafil 5 MG tablet Commonly known as: CIALIS TAKE ONE TABLET BY MOUTH DAILY AS NEEDED FOR ERECTILE DYSFUNCTION   tamsulosin 0.4 MG Caps capsule Commonly known as: FLOMAX TAKE 1 CAPSULE (0.4 MG TOTAL) BY MOUTH 2 (TWO) TIMES DAILY.   terazosin 1 MG capsule Commonly known as: HYTRIN Take 1 capsule (1 mg total) by mouth at bedtime.   testosterone cypionate 200  MG/ML injection Commonly known as: DEPOTESTOSTERONE CYPIONATE INJECT 0.25 MLS (50 MG TOTAL) INTO THE MUSCLE ONCE A WEEK.       Allergies:  Allergies  Allergen Reactions   Mirtazapine Other (See Comments)    Other reaction(s): Other (See Comments) Irritability/anger, increased appetite, worsening depression Irritability/anger, increased appetite, worsening depression     Family History: No family history on file.  Social History:  reports that he has never smoked. He has never used smokeless tobacco. He reports that he does not drink alcohol and does not use drugs.   Physical Exam: BP 124/85    Pulse (!) 103    Ht 5\' 7"  (1.702 m)    Wt 181 lb 11.2 oz (82.4 kg)    BMI 28.46 kg/m   Constitutional:  Alert and oriented, No acute distress. HEENT: [place] AT, moist mucus membranes.  Trachea midline, no masses. Cardiovascular: No clubbing, cyanosis, or edema. Respiratory: Normal respiratory effort, no increased work of breathing. GI: Abdomen is soft, nontender, nondistended, no abdominal masses GU: No CVA tenderness.    Rectal: Patient with normal sphincter tone. Prostate is approximately 60 grams, smooth with no nodules/tenderness. Skin: No rashes, bruises or suspicious lesions. Neurologic: Grossly intact, no focal deficits, moving all 4 extremities. Psychiatric: Normal mood and affect.  Laboratory Data:  Lab Results  Component Value Date   TESTOSTERONE 368 06/06/2019    Pertinent Imaging: Results for orders placed or performed in visit on 12/07/19  Bladder Scan (Post Void Residual) in office  Result Value Ref Range   Scan Result 3       Assessment & Plan:    1. Hypogonadism in male Stable symptoms on TRT Testosterone, PSA, hematocrit today Lab visit 6 months for testosterone/hematocrit Testosterone refilled  2. BPH with LUTS, symptoms details unspecified Continue tamsulosin and tadalafil; refill sent PVR is 3 mL.   3. Erectile dysfunction   Presently not sexually active  4.  Painful ejaculation Continue Terazosin, refill sent   [facility] [address] [place], [place] [phone]  I, [phone], am acting as a scribe for Dr. Theador Hawthorne C. Rilei Kravitz,  I have reviewed the above documentation for accuracy and completeness, and I agree with the above.    Lorin Picket, MD

## 2019-12-07 ENCOUNTER — Encounter: Payer: Self-pay | Admitting: Urology

## 2019-12-07 ENCOUNTER — Other Ambulatory Visit: Payer: Self-pay

## 2019-12-07 ENCOUNTER — Ambulatory Visit (INDEPENDENT_AMBULATORY_CARE_PROVIDER_SITE_OTHER): Payer: Medicare Other | Admitting: Urology

## 2019-12-07 VITALS — BP 124/85 | HR 103 | Ht 67.0 in | Wt 181.7 lb

## 2019-12-07 DIAGNOSIS — E291 Testicular hypofunction: Secondary | ICD-10-CM

## 2019-12-07 DIAGNOSIS — R339 Retention of urine, unspecified: Secondary | ICD-10-CM

## 2019-12-07 DIAGNOSIS — N138 Other obstructive and reflux uropathy: Secondary | ICD-10-CM

## 2019-12-07 DIAGNOSIS — N401 Enlarged prostate with lower urinary tract symptoms: Secondary | ICD-10-CM | POA: Diagnosis not present

## 2019-12-07 LAB — BLADDER SCAN AMB NON-IMAGING: Scan Result: 3

## 2019-12-11 ENCOUNTER — Encounter: Payer: Self-pay | Admitting: Urology

## 2019-12-11 MED ORDER — TAMSULOSIN HCL 0.4 MG PO CAPS: 0.4000 mg | ORAL_CAPSULE | Freq: Two times a day (BID) | ORAL | 2 refills | Status: DC

## 2019-12-11 MED ORDER — TERAZOSIN HCL 1 MG PO CAPS
1.0000 mg | ORAL_CAPSULE | Freq: Every day | ORAL | 3 refills | Status: DC
Start: 2019-12-11 — End: 2020-12-03

## 2019-12-11 MED ORDER — TESTOSTERONE CYPIONATE 200 MG/ML IM SOLN: INTRAMUSCULAR | 2 refills | Status: DC

## 2019-12-11 MED ORDER — TADALAFIL 5 MG PO TABS
ORAL_TABLET | ORAL | 3 refills | Status: DC
Start: 2019-12-11 — End: 2020-12-06

## 2019-12-13 ENCOUNTER — Telehealth: Payer: Self-pay

## 2019-12-13 NOTE — Telephone Encounter (Signed)
Drawn around midcycle-halfway between injections

## 2019-12-13 NOTE — Telephone Encounter (Signed)
Pt calls and states that he will not be in town this Friday for his lab drawn. He states he will not be back in town until approx. 12/27/19. When does he need to have his labs rescheduled to in relation to that? Please advise.

## 2019-12-14 NOTE — Telephone Encounter (Signed)
Left massage for patient  Be scheduled for a week after this injection for testosterone

## 2019-12-15 ENCOUNTER — Other Ambulatory Visit: Payer: Self-pay

## 2019-12-15 DIAGNOSIS — E291 Testicular hypofunction: Secondary | ICD-10-CM

## 2019-12-15 NOTE — Telephone Encounter (Signed)
Patient is going out of town and is unable to get a refill from his pharmacy because it is to soon. The pharmacy said that if a one week dose was sent in they could fill early so that patient could take his testosterone while away.

## 2019-12-16 ENCOUNTER — Other Ambulatory Visit: Payer: Self-pay | Admitting: Urology

## 2019-12-16 ENCOUNTER — Other Ambulatory Visit: Payer: Medicare Other

## 2019-12-16 MED ORDER — TESTOSTERONE CYPIONATE 200 MG/ML IM SOLN: 50.0000 mg | INTRAMUSCULAR | 0 refills | Status: DC

## 2020-02-01 ENCOUNTER — Other Ambulatory Visit: Payer: Self-pay | Admitting: Urology

## 2020-02-03 ENCOUNTER — Other Ambulatory Visit: Payer: Self-pay | Admitting: Urology

## 2020-02-03 DIAGNOSIS — E291 Testicular hypofunction: Secondary | ICD-10-CM

## 2020-02-03 NOTE — Telephone Encounter (Signed)
Please contact patient at [phone] to discuss.  He is having trouble managing his testosterone prescription.  He states that he is out of medication, but the pharmacy says that it is too soon to refill.    He said that it is not urgent and can wait until Monday.

## 2020-02-03 NOTE — Telephone Encounter (Signed)
I had a lengthy discussion with him at the last office visit and do not really understand what he is saying.  He states he runs out of testosterone at the end of the month and the pharmacy will not refill.  Please contact the patient on Monday to verify what the problem is and then contact the pharmacy to see what they are saying and will try to figure it out

## 2020-02-06 NOTE — Telephone Encounter (Signed)
LMOM for patient to return call.

## 2020-02-06 NOTE — Telephone Encounter (Signed)
Pt wife called back. Pt wife states he misplaced the two bottles ( two months worth ) and would like [place] to resend them in. She is aware that the insurance company will not cover medication until December.

## 2020-02-09 NOTE — Addendum Note (Signed)
Addended by: Martha Clan on: 02/09/2020 02:10 PM   Modules accepted: Orders

## 2020-02-09 NOTE — Telephone Encounter (Signed)
Patient called again to request script to be resent to pharmacy. He has misplaced his vials and does not currently have an medications

## 2020-02-10 ENCOUNTER — Telehealth: Payer: Self-pay | Admitting: Urology

## 2020-02-10 MED ORDER — TESTOSTERONE CYPIONATE 200 MG/ML IM SOLN: INTRAMUSCULAR | 0 refills | Status: DC

## 2020-02-10 NOTE — Telephone Encounter (Signed)
Sent to dr. Lonna Cobb

## 2020-02-10 NOTE — Telephone Encounter (Signed)
Patient called the office today and left a voice mail message requesting a refill of his testosterone to [facility] to [facility] [address].  Please call the patient to advise.

## 2020-04-25 ENCOUNTER — Telehealth: Payer: Self-pay | Admitting: Urology

## 2020-04-25 ENCOUNTER — Other Ambulatory Visit: Payer: Self-pay | Admitting: *Deleted

## 2020-04-25 MED ORDER — BD LUER-LOK SYRINGE 22G X 1-1/2" 3 ML MISC
20 refills | Status: DC
Start: 2020-04-25 — End: 2020-07-13

## 2020-04-25 NOTE — Telephone Encounter (Signed)
Incoming voice mail from the patient requesting a RX for 3 ml, 22G needles to [facility] [address].  Please call the patient if you have questions. He can be reached at [phone].

## 2020-06-09 ENCOUNTER — Other Ambulatory Visit: Payer: Self-pay | Admitting: Urology

## 2020-06-09 DIAGNOSIS — E291 Testicular hypofunction: Secondary | ICD-10-CM

## 2020-06-11 ENCOUNTER — Telehealth: Payer: Self-pay

## 2020-06-11 NOTE — Telephone Encounter (Signed)
Incoming voicemail on triage line from patient in regards to his Testosterone refill. Upon returning patients call, patient states his testosterone refill has already been sent in and is ready for pickup at pharmacy. Confirmed patients lab appt for 06/13/20.

## 2020-06-13 ENCOUNTER — Other Ambulatory Visit: Payer: Self-pay

## 2020-06-19 ENCOUNTER — Other Ambulatory Visit: Payer: Medicare Other

## 2020-06-19 ENCOUNTER — Other Ambulatory Visit: Payer: Self-pay

## 2020-06-19 DIAGNOSIS — E291 Testicular hypofunction: Secondary | ICD-10-CM

## 2020-06-20 ENCOUNTER — Other Ambulatory Visit: Payer: Medicare Other

## 2020-06-20 LAB — HEMATOCRIT: Hematocrit: 43.5 % (ref 37.5–51.0)

## 2020-06-20 LAB — TESTOSTERONE: Testosterone: 630 ng/dL (ref 264–916)

## 2020-06-20 LAB — PSA: Prostate Specific Ag, Serum: 3.7 ng/mL (ref 0.0–4.0)

## 2020-06-22 ENCOUNTER — Telehealth: Payer: Self-pay | Admitting: *Deleted

## 2020-06-22 NOTE — Telephone Encounter (Signed)
Notified patient as instructed, patient pleased °

## 2020-06-22 NOTE — Telephone Encounter (Signed)
-----   Message from Riki Altes, MD sent at 06/22/2020  3:15 PM EST ----- Testosterone level looks good at 630; PSA stable at 3.7 and hematocrit was normal

## 2020-07-13 ENCOUNTER — Telehealth: Payer: Self-pay | Admitting: *Deleted

## 2020-07-13 MED ORDER — "BD LUER-LOK SYRINGE 22G X 1-1/2"" 3 ML MISC": 20 refills | Status: DC

## 2020-07-13 NOTE — Telephone Encounter (Signed)
Received call requests refill for syringes-sent to pharmacy as requested.

## 2020-08-09 ENCOUNTER — Other Ambulatory Visit: Payer: Self-pay | Admitting: Urology

## 2020-08-09 DIAGNOSIS — E291 Testicular hypofunction: Secondary | ICD-10-CM

## 2020-08-10 NOTE — Telephone Encounter (Signed)
Pt called triage line, LM requesting RX refill for testosterone. Please advise.

## 2020-09-07 ENCOUNTER — Other Ambulatory Visit: Payer: Self-pay | Admitting: Urology

## 2020-09-07 DIAGNOSIS — E291 Testicular hypofunction: Secondary | ICD-10-CM

## 2020-09-12 ENCOUNTER — Other Ambulatory Visit: Payer: Self-pay | Admitting: Urology

## 2020-09-12 DIAGNOSIS — N138 Other obstructive and reflux uropathy: Secondary | ICD-10-CM

## 2020-10-05 ENCOUNTER — Telehealth: Payer: Self-pay | Admitting: Family Medicine

## 2020-10-05 NOTE — Telephone Encounter (Signed)
[name] returned call-his next testosterone dose is due tomorrow. He is currently out of testosterone. [facility] [place] will be out of town tomorrow.

## 2020-10-05 NOTE — Telephone Encounter (Signed)
LMOM for patient to return call about Testosterone denial from insurance company.

## 2020-10-05 NOTE — Telephone Encounter (Signed)
Spoke to patient and informed him that we cannot send Testosterone to a different pharmacy. He is welcome to [facility] [place] and see if they will transfer the RX and fill it. Patient will have to pay out of pocket with the Good RX coupon since insurance has denied the medication.

## 2020-10-08 NOTE — Telephone Encounter (Signed)
Spoke to patient's wife and informed her that the insurance company has denied the Testosterone. She is going to pick up the RX using the Goodrx app.

## 2020-10-08 NOTE — Telephone Encounter (Signed)
Pt's wife LM on triage line stating that her husband did not understand what he was told in regards to his Testosterone. She requests that someone call her to review the issue. She requests that we try to speak with her first with these matters. [phone].

## 2020-11-26 ENCOUNTER — Other Ambulatory Visit: Payer: Self-pay

## 2020-11-26 DIAGNOSIS — E291 Testicular hypofunction: Secondary | ICD-10-CM

## 2020-11-26 NOTE — Telephone Encounter (Signed)
Patient Blake [name] stating he is out of medication and will need to give an injection prior to his upcoming apt and would like a refill

## 2020-11-27 MED ORDER — TESTOSTERONE CYPIONATE 200 MG/ML IM SOLN
INTRAMUSCULAR | 0 refills | Status: DC
Start: 2020-11-27 — End: 2020-12-06

## 2020-11-30 ENCOUNTER — Telehealth: Payer: Self-pay

## 2020-11-30 ENCOUNTER — Other Ambulatory Visit: Payer: Self-pay | Admitting: Urology

## 2020-11-30 NOTE — Telephone Encounter (Signed)
Pt called asking for refill on testosterone

## 2020-12-06 ENCOUNTER — Other Ambulatory Visit: Payer: Self-pay

## 2020-12-06 ENCOUNTER — Encounter: Payer: Self-pay | Admitting: Urology

## 2020-12-06 ENCOUNTER — Ambulatory Visit (INDEPENDENT_AMBULATORY_CARE_PROVIDER_SITE_OTHER): Payer: Medicare Other | Admitting: Urology

## 2020-12-06 ENCOUNTER — Other Ambulatory Visit: Payer: Self-pay | Admitting: Urology

## 2020-12-06 VITALS — BP 151/89 | HR 80 | Ht 67.0 in | Wt 186.0 lb

## 2020-12-06 DIAGNOSIS — E291 Testicular hypofunction: Secondary | ICD-10-CM | POA: Diagnosis not present

## 2020-12-06 DIAGNOSIS — N138 Other obstructive and reflux uropathy: Secondary | ICD-10-CM

## 2020-12-06 DIAGNOSIS — N401 Enlarged prostate with lower urinary tract symptoms: Secondary | ICD-10-CM | POA: Diagnosis not present

## 2020-12-06 MED ORDER — TADALAFIL 5 MG PO TABS: ORAL_TABLET | ORAL | 3 refills | Status: DC

## 2020-12-06 MED ORDER — TESTOSTERONE CYPIONATE 200 MG/ML IM SOLN: 50.0000 mg | INTRAMUSCULAR | 0 refills | Status: DC

## 2020-12-06 NOTE — Progress Notes (Signed)
12/06/2020 9:29 AM   Blake [name] 07-03-1950 [id number]  Referring provider: Dorothey Baseman, MD 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 male presents for annual follow-up.  He is accompanied by his wife.   Last labs 06/19/2020: Testosterone 630; PSA 3.7; hematocrit 43.5 Hematocrit with PCP 08/21/2020 was 45.0 He is injecting 50 mg weekly He states he has not had an injection in 2 weeks and the pharmacy stated they cannot refill until September because he should have enough medication Voiding pattern is stable.  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   Coronary artery disease    Dementia (HCC)    Depression    Disorder of male genital organs 07/11/2013   Dyspnea    Encounter for long-term (current) use of medications 11/21/2014   Erectile dysfunction 07/11/2013   Executive function deficit 03/06/2016   GERD (gastroesophageal reflux disease)    Headache    stress   Hearing loss    Hyperlipidemia    Hypertension    Hypogonadism, 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   Major depression, recurrent, chronic (HCC) 03/06/2016   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   Mixed hyperlipidemia 02/02/2014   Myocardial infarction (HCC) 08/2013   OCD (obsessive compulsive disorder)    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   Other specified disorder of male genital organs(608.89) 07/11/2013   Prostatic hypertrophy    Pseudophakia of both eyes 04/20/2015   Raynaud's disease    Reduced libido 07/11/2013   Repeated falls    weak left ankle   Scoliosis    Stroke (HCC) 2/16   "light"   Synovitis    knees, ankles    Surgical History: Past Surgical History:  Procedure Laterality Date   BACK SURGERY  2011   rods and screws   CLOSED REDUCTION NASAL FRACTURE Bilateral 06/11/2018   Procedure: CLOSED REDUCTION NASAL FRACTURE;  Surgeon: Vernie Murders, MD;  Location: [facility];  Service: ENT;  Laterality: Bilateral;   COLONOSCOPY  2015   CORONARY ANGIOPLASTY     2015 stent   CORONARY STENT INTERVENTION N/A 06/08/2019   Procedure: CORONARY STENT INTERVENTION;  Surgeon: Alwyn Pea, MD;  Location: [facility];  Service: Cardiovascular;  Laterality: N/A;   CYSTOSCOPY  1984   EYE SURGERY Bilateral 2005,2006,2009   detached retina, cataract   FOOT ARTHRODESIS Left 05/30/2015   Procedure: ARTHRODESIS FOOT STJ LT FOOT;  Surgeon: [name], DPM;  Location: [facility];  Service: Podiatry;  Laterality: Left;  WITH POPLITEAL BLOCK  FOOT SURGERY Left    HERNIA REPAIR Right 1969   inguinal   LEFT HEART CATH AND CORONARY ANGIOGRAPHY Left 06/08/2019   Procedure: LEFT HEART CATH AND CORONARY ANGIOGRAPHY;  Surgeon: Alwyn Pea, MD;  Location: [facility];  Service: Cardiovascular;  Laterality: Left;   SEPTOPLASTY Bilateral 06/11/2018   Procedure: SEPTOPLASTY;  Surgeon: Vernie Murders, MD;  Location: [facility];  Service: ENT;  Laterality: Bilateral;   SHOULDER SURGERY Right  2004   skull surgery     TOE SURGERY Right 2009   TONSILLECTOMY  2008    Home Medications:  Allergies as of 12/06/2020       Reactions   Mirtazapine Other (See Comments)   Other reaction(s): Other (See Comments) Irritability/anger, increased appetite, worsening depression Irritability/anger, increased appetite, worsening depression        Medication List        Accurate as of December 06, 2020  9:29 AM. If you have any questions, ask your nurse or doctor.          acetaminophen 500 MG tablet Commonly known as: TYLENOL Take 1,000 mg by mouth every 6 (six) hours as needed for mild pain or moderate pain.   aspirin EC 81 MG tablet Take 81 mg by mouth daily.   B-D 3CC LUER-LOK SYR 22GX1-1/2 22G X 1-1/2" 3 ML Misc Generic drug: SYRINGE-NEEDLE (DISP) 3 ML USE AS DIRECTED WITH TESTOSTERONE   Calcium Carbonate-Vitamin D 600-200 MG-UNIT Caps Take 1 tablet by mouth 2 (two) times daily.   carvedilol 3.125 MG tablet Commonly known as: COREG Take 3.125 mg by mouth daily.   clindamycin 1 % lotion Commonly known as: CLEOCIN T Apply 1 application topically 2 (two) times daily as needed.   clopidogrel 75 MG tablet Commonly known as: PLAVIX Take 75 mg by mouth daily.   dexlansoprazole 60 MG capsule Commonly known as: DEXILANT Take 60 mg by mouth every evening.   DULoxetine 60 MG capsule Commonly known as: CYMBALTA Take 60 mg by mouth 2 (two) times daily.   Entresto 24-26 MG Generic drug: sacubitril-valsartan Take 1 tablet by mouth every 12 (twelve) hours.   FIBER PO Take 1 tablet by mouth 2 (two) times daily.   folic acid 1 MG tablet Commonly known as: FOLVITE Take 1 mg by mouth daily.   furosemide 20 MG tablet Commonly known as: LASIX Take 20 mg by mouth daily.   lisinopril 40 MG tablet Commonly known as: ZESTRIL Take 1 tablet (40 mg total) by mouth daily.   melatonin 5 MG Tabs Take 5 mg by mouth at bedtime. 30 min before bed   methotrexate 50 MG/2ML  injection Inject 0.8 mg into the skin once a week.   nitroGLYCERIN 0.4 MG SL tablet Commonly known as: NITROSTAT Place 0.4 mg under the tongue every 2 (two) hours as needed.   PROBIOTIC DAILY PO Take 1 tablet by mouth daily. Senior   SUPER B COMPLEX PO Take 1 tablet by mouth daily.   tadalafil 5 MG tablet Commonly known as: CIALIS TAKE ONE TABLET BY MOUTH DAILY   tamsulosin 0.4 MG Caps capsule Commonly known as: FLOMAX TAKE 1 CAPSULE (0.4 MG TOTAL) BY MOUTH 2 (TWO) TIMES DAILY.   terazosin 1 MG capsule Commonly known as: HYTRIN TAKE 1 CAPSULE (1 MG TOTAL) BY MOUTH AT BEDTIME.   testosterone cypionate 200 MG/ML injection Commonly known as: DEPOTESTOSTERONE CYPIONATE INJECT 0.5 CC EVERY 2 WEEKS        Allergies:  Allergies  Allergen Reactions   Mirtazapine Other (See Comments)    Other reaction(s): Other (See Comments) Irritability/anger, increased appetite, worsening depression Irritability/anger, increased appetite, worsening depression     Family History: History reviewed. No pertinent family history.  Social History:  reports that he has never smoked. He has never used smokeless tobacco. He reports that he does not drink alcohol and does not use drugs.   Physical Exam: BP (!) 151/89   Pulse 80   Ht 5\' 7"  (1.702 m)   Wt 186 lb (84.4 kg)   BMI 29.13 kg/m   Constitutional:  Alert and oriented, No acute distress. HEENT: [place] AT, moist mucus membranes.  Trachea midline, no masses. Cardiovascular: No clubbing, cyanosis, or edema. Respiratory: Normal respiratory effort, no increased work of breathing. GI: Abdomen is soft, nontender, nondistended, no abdominal masses GU: Prostate 60 g, smooth without nodules Skin: No rashes, bruises or suspicious lesions. Neurologic: Grossly intact, no focal deficits, moving all 4 extremities. Psychiatric: Normal mood and affect.   Assessment & Plan:    1.  Hypogonadism I requested that his wife verify what he is drawing  up prior to his injection Will change the Rx to read 50 mg weekly Labs not drawn today since he has been off testosterone x2 weeks.  Schedule lab visit for testosterone level when he has been back on replacement for 1 month Continue annual follow-up and 72-month interim lab draw  2.  BPH with LUTS Tadalafil, tamsulosin refilled   8-month, MD  [facility] [address] [place], [place] [phone]

## 2020-12-12 ENCOUNTER — Ambulatory Visit: Payer: Self-pay | Admitting: Urology

## 2021-01-07 ENCOUNTER — Other Ambulatory Visit: Payer: Self-pay | Admitting: *Deleted

## 2021-01-07 MED ORDER — TADALAFIL 5 MG PO TABS: ORAL_TABLET | ORAL | 3 refills | Status: DC

## 2021-01-15 ENCOUNTER — Other Ambulatory Visit: Payer: Medicare Other

## 2021-02-12 ENCOUNTER — Other Ambulatory Visit: Payer: Medicare Other

## 2021-02-12 ENCOUNTER — Other Ambulatory Visit: Payer: Self-pay

## 2021-02-12 DIAGNOSIS — E291 Testicular hypofunction: Secondary | ICD-10-CM

## 2021-02-12 DIAGNOSIS — N138 Other obstructive and reflux uropathy: Secondary | ICD-10-CM

## 2021-02-13 ENCOUNTER — Telehealth: Payer: Self-pay | Admitting: *Deleted

## 2021-02-13 LAB — TESTOSTERONE: Testosterone: 657 ng/dL (ref 264–916)

## 2021-02-13 LAB — PSA: Prostate Specific Ag, Serum: 3.6 ng/mL (ref 0.0–4.0)

## 2021-02-13 LAB — HEMATOCRIT: Hematocrit: 43.1 % (ref 37.5–51.0)

## 2021-02-13 NOTE — Telephone Encounter (Signed)
-----   Message from Riki Altes, MD sent at 02/13/2021  6:33 AM EDT ----- PSA, hematocrit and testosterone levels were all within acceptable levels.  Follow-up as scheduled

## 2021-02-13 NOTE — Telephone Encounter (Signed)
Left message on voice mail per DPR .  °

## 2021-02-27 ENCOUNTER — Telehealth: Payer: Self-pay | Admitting: Urology

## 2021-02-27 ENCOUNTER — Other Ambulatory Visit: Payer: Self-pay | Admitting: *Deleted

## 2021-02-27 DIAGNOSIS — E291 Testicular hypofunction: Secondary | ICD-10-CM

## 2021-02-27 NOTE — Telephone Encounter (Signed)
Patient called in wanting a refill on his testosterone.

## 2021-03-01 MED ORDER — TESTOSTERONE CYPIONATE 200 MG/ML IM SOLN: 50.0000 mg | INTRAMUSCULAR | 1 refills | Status: DC

## 2021-03-05 ENCOUNTER — Other Ambulatory Visit: Payer: Self-pay | Admitting: Urology

## 2021-03-05 DIAGNOSIS — E291 Testicular hypofunction: Secondary | ICD-10-CM

## 2021-03-21 ENCOUNTER — Other Ambulatory Visit: Payer: Self-pay

## 2021-03-21 ENCOUNTER — Encounter: Payer: Medicare Other | Admitting: Speech Pathology

## 2021-03-21 ENCOUNTER — Ambulatory Visit: Payer: Medicare Other | Attending: Neurology | Admitting: Speech Pathology

## 2021-03-21 DIAGNOSIS — R41841 Cognitive communication deficit: Secondary | ICD-10-CM | POA: Insufficient documentation

## 2021-03-21 NOTE — Addendum Note (Signed)
Addended by: Arlana Lindau on: 03/21/2021 05:29 PM   Modules accepted: Orders

## 2021-03-21 NOTE — Therapy (Signed)
[facility] MAIN [facility] SERVICES [address] [place], [place], [address] Phone: [phone]   Fax:  [phone]  Speech Language Pathology Evaluation  Patient Details  Name: Blake [name] MRN: [id number] Date of Birth: [birth year] Referring Provider (SLP): Dr. 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   Chronic left shoulder pain 04/03/2016   Chronic prostatitis 07/11/2013   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   Coronary artery disease    Dementia (HCC)    Depression    Disorder of male genital organs 07/11/2013   Dyspnea    Encounter for long-term (current) use of medications 11/21/2014   Erectile dysfunction 07/11/2013   Executive function deficit 03/06/2016   GERD (gastroesophageal reflux disease)    Headache    stress   Hearing loss    Hyperlipidemia    Hypertension    Hypogonadism, 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   Major depression, recurrent, chronic (HCC) 03/06/2016   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   Mixed hyperlipidemia 02/02/2014   Myocardial infarction (HCC) 08/2013   OCD (obsessive compulsive disorder)    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   Other specified disorder of male genital organs(608.89)  07/11/2013   Prostatic hypertrophy    Pseudophakia of both eyes 04/20/2015   Raynaud's disease    Reduced libido 07/11/2013   Repeated falls    weak left ankle   Scoliosis    Stroke (HCC) 2/16   "light"   Synovitis    knees, ankles    Past Surgical History:  Procedure Laterality Date   BACK SURGERY  2011   rods and screws   CLOSED REDUCTION NASAL FRACTURE Bilateral 06/11/2018   Procedure: CLOSED REDUCTION NASAL FRACTURE;  Surgeon: Vernie Murders, MD;  Location: [facility];  Service: ENT;  Laterality: Bilateral;   COLONOSCOPY  2015   CORONARY ANGIOPLASTY     2015 stent   CORONARY STENT INTERVENTION N/A 06/08/2019   Procedure: CORONARY STENT INTERVENTION;  Surgeon: Alwyn Pea, MD;  Location: [facility];  Service: Cardiovascular;  Laterality: N/A;   CYSTOSCOPY  1984   EYE SURGERY Bilateral 2005,2006,2009   detached retina, cataract   FOOT ARTHRODESIS Left 05/30/2015   Procedure: ARTHRODESIS FOOT STJ LT FOOT;  Surgeon: [name], DPM;  Location: [facility];  Service: Podiatry;  Laterality: Left;  WITH POPLITEAL BLOCK   FOOT SURGERY Left    HERNIA REPAIR Right 1969   inguinal   LEFT HEART CATH AND CORONARY ANGIOGRAPHY Left 06/08/2019   Procedure: LEFT HEART CATH AND CORONARY ANGIOGRAPHY;  Surgeon: Juliann Pares,  Bobbie Stack, MD;  Location: [facility];  Service: Cardiovascular;  Laterality: Left;   SEPTOPLASTY Bilateral 06/11/2018   Procedure: SEPTOPLASTY;  Surgeon: Vernie Murders, MD;  Location: [facility];  Service: ENT;  Laterality: Bilateral;   SHOULDER SURGERY Right 2004   skull surgery     TOE SURGERY Right 2009   TONSILLECTOMY  2008    There were no vitals filed for this visit.   Subjective Assessment - 03/21/21 1243     Subjective "She'll tell you."    Patient is accompained by: Family member   wife [name]   Currently in Pain? No/denies                SLP Evaluation [facility] - 03/21/21 1243       SLP Visit Information   SLP Received On  03/21/21    Referring Provider (SLP) Dr. Cristopher Peru    Onset Date 02/19/21   referral date; chronic/multiple TBI dating back to 1991, 2011, 2020   Medical Diagnosis TBI      General Information   HPI Patient is a 70 y.o. male referred by Dr. Sherryll Burger for cognitive training. 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 Also has hx of CVA (2018), HTN, depression, CHF, and hearing loss (bilateral OTE HA).    Behavioral/Cognition pleasant, cooperative    Mobility Status ambulated unassisted      Balance Screen   Has the patient fallen in the past 6 months Yes    How many times? 1    Has the patient had a decrease in activity level because of a fear of falling?  No    Is the patient reluctant to leave their home because of a fear of falling?  No      Prior Functional Status   Cognitive/Linguistic Baseline Baseline deficits    Baseline deficit details severe TBI with longstanding cognitive deficits, poor social inhibition, apathy per Neuro    Type of Home House     Lives With Spouse    Available Support Family    Vocation Retired   Therapist, art   Overall Cognitive Status History of cognitive impairments - at baseline    Attention Selective    Selective Attention Appears intact    Memory Impaired    Awareness Impaired    Awareness Impairment Emergent impairment;Anticipatory impairment   aware of memory deficit   Problem Solving --   not formally assessed   Executive Function --   not formally assessed   Behaviors Impulsive;Verbal agitation   reported; no behaviors observed today     Auditory Comprehension   Overall Auditory Comprehension Other (comment)   needs repetition due to decreased hearing, slow processing   Yes/No Questions Within Functional Limits    Commands Within Functional Limits     Conversation Complex    Interfering Components Processing speed;Hearing    EffectiveTechniques Increased volume;Extra processing time;Pausing;Repetition      Visual Recognition/Discrimination   Discrimination Not tested      Reading Comprehension   Reading Status Not tested   per wife, reads 3 hours per day (medical journals, newspapers)     Expression   Primary Mode of Expression Verbal      Verbal Expression   Overall Verbal Expression Appears within functional limits for tasks assessed    Initiation No impairment  Automatic Speech Name;Social Response    Level of Generative/Spontaneous Verbalization Conversation    Naming No impairment   none observed; wife reports possible paraphasias, occur later in the day or when pt is fatigued   Pragmatics Impairment    Impairments Monotone   affect somewhat flat; wife also reports hx Asperger's syndrome   Other Verbal Expression Comments per wife, pt at times will start "in the middle of a sentence." At times agitated when others don't understand      Written Expression   Written Expression Not tested      Oral Motor/Sensory Function   Overall Oral Motor/Sensory Function Appears within functional limits for tasks assessed      Motor Speech   Overall Motor Speech Appears within functional limits for tasks assessed      Standardized Assessments   Standardized Assessments  Other Assessment;[place] Naming Test-2nd edition    [place] Naming Test-2nd edition  58/60    Other Assessment see below             [employer]  [employer] Test was administered. Pt scored 58/60. This is 1.4 SD above norm of 48.9 for age range of 14-79.  Number of spontaneously given correct responses: 57 Number of stimulus cues given: 3 Number of correct responses following a stimulus cue: 1 Number of phonemic cues: 1 Number of correct responses following a phonemic cue: 1 Number of multiple choices given: 0 Number of correct choices:  0  Paraphasias Phonological: 0 Verbal: 1 (prongs/tongs) Neologistic: 0 Multi-word: 0 Perceptual:0   Hopkins Verbal Learning Test Form 1; immediate recall: (trial 1: 6 (norm 7.17), trial 2: 9 (norm 9.17), trial 3: 11 (norm 9.88). Recognition 12/12. False positives: 4. Discrimination index: True positives: 12 (norm 11.88) False positives: 4 (norm 0.59).   SLP Education - 03/21/21 1254     Education Details proposed ST POC, focus on strategies/compensations and cognitive home program    Person(s) Educated Patient    Methods Explanation    Comprehension Verbalized understanding;Need further instruction                SLP Long Term Goals - 03/21/21 1706       SLP LONG TERM GOAL #1   Title Patient, spouse will report carryover of communication effectiveness strategies to reduce frustration when breakdowns occur.    Time 4    Period Weeks    Status New    Target Date 05/20/21      SLP LONG TERM GOAL #2   Title Patient will recall information about daily routine/schedule using his phone calendar with min cues.    Time 4    Period Weeks    Status New      SLP LONG TERM GOAL #3   Title Patient will participate actively in 5-8 minute discussion re: topic/article of interest using supportive conversation strategies such as written keywords.    Time 4    Period Weeks    Status New      SLP LONG TERM GOAL #4   Title Patient/spouse will report daily completion of cognitive home activities 5/7 days.    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  Patient presents with spouse who reports most of the history. They use some strategies at home to assist pt with his functioning, such as a weekly review of a calendar, and tying his cell phone to his clothing to prevent him from  losing it. Spouse has noted pt "moving words around", making up words, saying words with a different first letter, and starting mid-sentence. Reports pt agitation when he is not understood. Patient scored well above norm on the [facility], as well as within normal range on verbal learning (immediate recall) on the [employer] Test. Pt did have several false-positives on list recognition on this assessment. I recommend brief course of ST focused on generating strategies/compensations to improve pt's function at home in tasks such as orientation and schedule management, as well as training pt/spouse in compensations to improve communication, resolve communication breakdowns and minimize pt frustration, and in implementing cognitive home program.    Speech Therapy Frequency 2x / week    Duration 4 weeks    Treatment/Interventions Environmental controls;SLP instruction and feedback;Functional tasks;Compensatory strategies;Patient/family education;Multimodal communcation approach;Internal/external aids    Potential to Achieve Goals Good    Potential Considerations Severity of impairments;Previous level of function   time post-onset   SLP Home Exercise Plan to be developed with pt/spouse in future sessions    Consulted and Agree with Plan of Care Patient;Family member/caregiver             Patient will benefit from skilled therapeutic intervention in order to improve the following deficits and impairments:   Cognitive communication deficit    Problem List Patient Active Problem List   Diagnosis Date Noted   S/P insertion of non-drug eluting coronary artery stent 06/08/2019   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) 06/05/2017   Chronic left shoulder pain 04/03/2016   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   Executive function deficit 03/06/2016   Major depression, recurrent, chronic (HCC) 03/06/2016   Anterior  uveitis 04/20/2015   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   Pseudophakia of both eyes 04/20/2015   Encounter for long-term (current) use of medications 11/21/2014   Hypertension 02/02/2014   Mixed hyperlipidemia 02/02/2014   CAD (coronary artery disease) 09/21/2013   BPH with obstruction/lower urinary tract symptoms 07/11/2013   Chronic prostatitis 07/11/2013   Reduced libido 07/11/2013   Disorder of male genital organs 07/11/2013   Erectile dysfunction 07/11/2013   Hypogonadism, 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   Other specified disorder of male genital organs(608.89) 07/11/2013   GERD (gastroesophageal reflux disease) 01/11/2013   Rondel Baton, MS, CCC-SLP Speech-Language Pathologist   Arlana Lindau, CCC-SLP 03/21/2021, 5:19 PM  [facility] MAIN [facility] SERVICES [address] [place], [place], [address] Phone: [phone]   Fax:  [phone]  Name: Blake [name] MRN: [id number] Date of Birth: [birth year]

## 2021-03-26 ENCOUNTER — Other Ambulatory Visit: Payer: Self-pay

## 2021-03-26 ENCOUNTER — Ambulatory Visit: Payer: Medicare Other | Admitting: Speech Pathology

## 2021-03-28 ENCOUNTER — Ambulatory Visit: Payer: Medicare Other | Admitting: Speech Pathology

## 2021-03-28 ENCOUNTER — Other Ambulatory Visit: Payer: Self-pay

## 2021-03-28 DIAGNOSIS — R41841 Cognitive communication deficit: Secondary | ICD-10-CM | POA: Diagnosis not present

## 2021-03-28 NOTE — Therapy (Signed)
[place] [facility] MAIN [facility] SERVICES [address] [place], [place], [address] Phone: [phone]   Fax:  [phone]  Speech Language Pathology Treatment  Patient Details  Name: Blake [name] MRN: [id number] Date of Birth: 03/12/1951 Referring Provider (SLP): Dr. 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   Chronic left shoulder pain 04/03/2016   Chronic prostatitis 07/11/2013   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   Coronary artery disease    Dementia (HCC)    Depression    Disorder of male genital organs 07/11/2013   Dyspnea    Encounter for long-term (current) use of medications 11/21/2014   Erectile dysfunction 07/11/2013   Executive function deficit 03/06/2016   GERD (gastroesophageal reflux disease)    Headache    stress   Hearing loss    Hyperlipidemia    Hypertension    Hypogonadism, 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   Major depression, recurrent, chronic (HCC) 03/06/2016   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   Mixed hyperlipidemia 02/02/2014   Myocardial infarction (HCC) 08/2013   OCD (obsessive compulsive disorder)    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   Other specified disorder of male genital organs(608.89) 07/11/2013    Prostatic hypertrophy    Pseudophakia of both eyes 04/20/2015   Raynaud's disease    Reduced libido 07/11/2013   Repeated falls    weak left ankle   Scoliosis    Stroke (HCC) 2/16   "light"   Synovitis    knees, ankles    Past Surgical History:  Procedure Laterality Date   BACK SURGERY  2011   rods and screws   CLOSED REDUCTION NASAL FRACTURE Bilateral 06/11/2018   Procedure: CLOSED REDUCTION NASAL FRACTURE;  Surgeon: Vernie Murders, MD;  Location: [facility];  Service: ENT;  Laterality: Bilateral;   COLONOSCOPY  2015   CORONARY ANGIOPLASTY     2015 stent   CORONARY STENT INTERVENTION N/A 06/08/2019   Procedure: CORONARY STENT INTERVENTION;  Surgeon: Alwyn Pea, MD;  Location: [facility];  Service: Cardiovascular;  Laterality: N/A;   CYSTOSCOPY  1984   EYE SURGERY Bilateral 2005,2006,2009   detached retina, cataract   FOOT ARTHRODESIS Left 05/30/2015   Procedure: ARTHRODESIS FOOT STJ LT FOOT;  Surgeon: [name], DPM;  Location: [facility];  Service: Podiatry;  Laterality: Left;  WITH POPLITEAL BLOCK   FOOT SURGERY Left    HERNIA REPAIR Right 1969   inguinal   LEFT HEART CATH AND CORONARY ANGIOGRAPHY Left 06/08/2019   Procedure: LEFT HEART CATH AND CORONARY ANGIOGRAPHY;  Surgeon: Juliann Pares,  Bobbie Stack, MD;  Location: [facility] CV [facility];  Service: Cardiovascular;  Laterality: Left;   SEPTOPLASTY Bilateral 06/11/2018   Procedure: SEPTOPLASTY;  Surgeon: Vernie Murders, MD;  Location: [facility] OR[facility];  Service: ENT;  Laterality: Bilateral;   SHOULDER SURGERY Right 2004   skull surgery     TOE SURGERY Right 2009   TONSILLECTOMY  2008    There were no vitals filed for this visit.   Subjective Assessment - 03/28/21 1010     Subjective Reports he had issue with GERD this week    Patient is accompained by: Family member   wife [name]   Currently in Pain? No/denies                   ADULT SLP TREATMENT - 03/28/21 1011       General Information    Behavior/Cognition Alert;Cooperative;Pleasant mood    HPI Patient is a 70 y.o. male referred by Dr. Sherryll Burger for cognitive training. 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 Also has hx of CVA (2018), HTN, depression, CHF, and hearing loss (bilateral OTE HA).      Treatment Provided   Treatment provided Cognitive-Linquistic      Pain Assessment   Pain Assessment No/denies pain      Cognitive-Linquistic Treatment   Treatment focused on Cognition;Patient/family/caregiver education    Skilled Treatment Session focused on strategy training to reduce/repair communication breakdowns, as well as generating plan to improve pt's functional recall using external aids. Pt requested repetition effectively when breakdowns occurred due to hearing loss with SLP, but often did not do so with his wife. With instruction, wife joined table for better positioning, but noted pt not always attending to her conversation. Education on getting pt's attention and also alerting to topic changes to improve comprehension. Spouse reported pt did not recall making a large bank deposit a week later. Education on use of memory strategies to aid recall. Spouse reports pt has sticky notes throughout the house. Suggested keeping a central location for all information, such as a binder with tabs/sections for pt to organize information and to keep daily schedule plans. Educated on incorporating use into daily routine with spouse's assistance, and benefits of pt writing the schedule to aid with retention/adoption of method.      Assessment / Recommendations / Plan   Plan Continue with current plan of care      Progression Toward Goals   Progression toward goals Progressing toward goals              SLP Education - 03/28/21 1019     Education Details aids  and strategies for communication and recall    Person(s) Educated Patient;Spouse    Methods Explanation    Comprehension Verbalized understanding                SLP Long Term Goals - 03/21/21 1706       SLP LONG TERM GOAL #1   Title Patient, spouse will report carryover of communication effectiveness strategies to reduce frustration when breakdowns occur.    Time 4    Period Weeks    Status New    Target Date 05/20/21      SLP LONG TERM GOAL #2   Title Patient will recall information about daily routine/schedule using his phone calendar with min cues.    Time 4  Period Weeks    Status New      SLP LONG TERM GOAL #3   Title Patient will participate actively in 5-8 minute discussion re: topic/article of interest using supportive conversation strategies such as written keywords.    Time 4    Period Weeks    Status New      SLP LONG TERM GOAL #4   Title Patient/spouse will report daily completion of cognitive home activities 5/7 days.    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 Initiated training in external aids and strategies to improve recall and reduce frustration with frequent communication breakdowns. I recommend brief course of ST focused on generating strategies/compensations to improve pt's function at home in tasks such as orientation and schedule management, as well as training pt/spouse in compensations to improve communication, resolve communication breakdowns and minimize pt frustration, and in implementing cognitive home program.    Speech Therapy Frequency 2x / week    Duration 4 weeks    Treatment/Interventions Environmental controls;SLP instruction and feedback;Functional tasks;Compensatory strategies;Patient/family education;Multimodal communcation  approach;Internal/external aids    Potential to Achieve Goals Good    Potential Considerations Severity of impairments;Previous level of function   time post-onset   SLP Home Exercise Plan to be developed with pt/spouse in future sessions    Consulted and Agree with Plan of Care Patient;Family member/caregiver             Patient will benefit from skilled therapeutic intervention in order to improve the following deficits and impairments:   Cognitive communication deficit    Problem List Patient Active Problem List   Diagnosis Date Noted   S/P insertion of non-drug eluting coronary artery stent 06/08/2019   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) 06/05/2017   Chronic left shoulder pain 04/03/2016   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   Executive function deficit 03/06/2016   Major depression, recurrent, chronic (HCC) 03/06/2016   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   Pseudophakia of both eyes 04/20/2015   Encounter for long-term (current) use of medications 11/21/2014   Hypertension 02/02/2014   Mixed hyperlipidemia 02/02/2014   CAD (coronary artery disease) 09/21/2013   BPH with obstruction/lower urinary tract symptoms 07/11/2013   Chronic prostatitis 07/11/2013   Reduced libido 07/11/2013   Disorder of male genital organs 07/11/2013   Erectile dysfunction 07/11/2013   Hypogonadism, 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   Other specified disorder of male genital organs(608.89) 07/11/2013   GERD (gastroesophageal reflux disease) 01/11/2013   Rondel Baton, MS, CCC-SLP Speech-Language Pathologist  Arlana Lindau, CCC-SLP 03/28/2021, 10:21 AM  [place] [facility] MAIN [facility] SERVICES [address] [place], [place], [address] Phone: [phone]   Fax:  [phone]   Name: Blake [name] MRN: [id number] Date of  Birth: [birth year]

## 2021-04-01 ENCOUNTER — Ambulatory Visit: Payer: Medicare Other | Admitting: Speech Pathology

## 2021-04-01 ENCOUNTER — Other Ambulatory Visit: Payer: Self-pay

## 2021-04-01 DIAGNOSIS — R41841 Cognitive communication deficit: Secondary | ICD-10-CM | POA: Diagnosis not present

## 2021-04-01 NOTE — Therapy (Signed)
[place] [facility] MAIN [facility] SERVICES [address] [place], [place], [address] Phone: [phone]   Fax:  [phone]  Speech Language Pathology Treatment  Patient Details  Name: Blake [name] MRN: [id number] Date of Birth: [birth year] Referring Provider (SLP): Dr. 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   Chronic left shoulder pain 04/03/2016   Chronic prostatitis 07/11/2013   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   Coronary artery disease    Dementia (HCC)    Depression    Disorder of male genital organs 07/11/2013   Dyspnea    Encounter for long-term (current) use of medications 11/21/2014   Erectile dysfunction 07/11/2013   Executive function deficit 03/06/2016   GERD (gastroesophageal reflux disease)    Headache    stress   Hearing loss    Hyperlipidemia    Hypertension    Hypogonadism, 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   Major depression, recurrent, chronic (HCC) 03/06/2016   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   Mixed hyperlipidemia 02/02/2014   Myocardial infarction (HCC) 08/2013   OCD (obsessive compulsive disorder)    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   Other specified disorder of male genital organs(608.89) 07/11/2013    Prostatic hypertrophy    Pseudophakia of both eyes 04/20/2015   Raynaud's disease    Reduced libido 07/11/2013   Repeated falls    weak left ankle   Scoliosis    Stroke (HCC) 2/16   "light"   Synovitis    knees, ankles    Past Surgical History:  Procedure Laterality Date   BACK SURGERY  2011   rods and screws   CLOSED REDUCTION NASAL FRACTURE Bilateral 06/11/2018   Procedure: CLOSED REDUCTION NASAL FRACTURE;  Surgeon: Vernie Murders, MD;  Location: [facility];  Service: ENT;  Laterality: Bilateral;   COLONOSCOPY  2015   CORONARY ANGIOPLASTY     2015 stent   CORONARY STENT INTERVENTION N/A 06/08/2019   Procedure: CORONARY STENT INTERVENTION;  Surgeon: Alwyn Pea, MD;  Location: [facility];  Service: Cardiovascular;  Laterality: N/A;   CYSTOSCOPY  1984   EYE SURGERY Bilateral 2005,2006,2009   detached retina, cataract   FOOT ARTHRODESIS Left 05/30/2015   Procedure: ARTHRODESIS FOOT STJ LT FOOT;  Surgeon: [name], DPM;  Location: [facility];  Service: Podiatry;  Laterality: Left;  WITH POPLITEAL BLOCK   FOOT SURGERY Left    HERNIA REPAIR Right 1969   inguinal   LEFT HEART CATH AND CORONARY ANGIOGRAPHY Left 06/08/2019   Procedure: LEFT HEART CATH AND CORONARY ANGIOGRAPHY;  Surgeon: Juliann Pares,  Bobbie Stack, MD;  Location: [facility];  Service: Cardiovascular;  Laterality: Left;   SEPTOPLASTY Bilateral 06/11/2018   Procedure: SEPTOPLASTY;  Surgeon: Vernie Murders, MD;  Location: [facility];  Service: ENT;  Laterality: Bilateral;   SHOULDER SURGERY Right 2004   skull surgery     TOE SURGERY Right 2009   TONSILLECTOMY  2008    There were no vitals filed for this visit.   Subjective Assessment - 04/01/21 1207     Subjective Pt reported frustration with some chores    Patient is accompained by: Family member   wife Blake [name]   Currently in Pain? No/denies                   ADULT SLP TREATMENT - 04/01/21 1209       General Information    Behavior/Cognition Alert;Cooperative;Pleasant mood    HPI Patient is a 70 y.o. male referred by Dr. Sherryll Burger for cognitive training. 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 Also has hx of CVA (2018), HTN, depression, CHF, and hearing loss (bilateral OTE HA).      Cognitive-Linquistic Treatment   Treatment focused on Cognition;Patient/family/caregiver education    Skilled Treatment Worked with pt, spouse to identify tasks and communication breakdowns creating frustration, and to generate strategies to reduce this. Discussed using checklists, visual reminders for using the leafblower and recycling/trash bins. Spouse has created a basket by the door to collect loose items and is working on setting up space to keep information and materials centralized.  Modeled in session how to reorient to topic when pt demonstrated signs of inattention or missed information due to hearing loss.      Assessment / Recommendations / Plan   Plan Continue with current plan of care      Progression Toward Goals   Progression toward goals Progressing toward goals              SLP Education - 04/01/21 1213     Education Details visual aids and checklists    Person(s) Educated Patient;Spouse    Methods Explanation    Comprehension Verbalized understanding                SLP Long Term Goals - 03/21/21 1706       SLP LONG TERM GOAL #1   Title Patient, spouse will report carryover of communication effectiveness strategies to reduce frustration when breakdowns occur.    Time 4    Period Weeks    Status New    Target Date 05/20/21      SLP LONG TERM GOAL #2   Title Patient will recall information about daily routine/schedule using his phone calendar with min cues.    Time 4    Period Weeks    Status New      SLP LONG TERM GOAL  #3   Title Patient will participate actively in 5-8 minute discussion re: topic/article of interest using supportive conversation strategies such as written keywords.    Time 4    Period Weeks    Status New      SLP LONG TERM GOAL #4   Title Patient/spouse will report daily completion of cognitive home activities 5/7 days.    Time 4    Period Weeks    Status New  Plan - 04/01/21 1213     Clinical Impression Statement Blake [name] is a pleasant, 70 y.o. gentleman with longstanding history of cognitive deficits s/p multiple TBIs in 1991, 2011, and 2020. Continued training in external aids and strategies to improve recall and reduce frustration with frequent communication breakdowns. I recommend brief course of ST focused on generating strategies/compensations to improve pt's function at home in tasks such as orientation and schedule management, as well as training pt/spouse in compensations to improve communication, resolve communication breakdowns and minimize pt frustration, and in implementing cognitive home program.    Speech Therapy Frequency 2x / week    Duration 4 weeks    Treatment/Interventions Environmental controls;SLP instruction and feedback;Functional tasks;Compensatory strategies;Patient/family education;Multimodal communcation approach;Internal/external aids    Potential to Achieve Goals Good    Potential Considerations Severity of impairments;Previous level of function   time post-onset   SLP Home Exercise Plan to be developed with pt/spouse in future sessions    Consulted and Agree with Plan of Care Patient;Family member/caregiver             Patient will benefit from skilled therapeutic intervention in order to improve the following deficits and impairments:   Cognitive communication deficit    Problem List Patient Active Problem List   Diagnosis Date Noted   S/P insertion of non-drug eluting coronary artery stent 06/08/2019   Impulsive type  attention deficit hyperactivity disorder (ADHD) 06/05/2017   Chronic left shoulder pain 04/03/2016   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   Executive function deficit 03/06/2016   Major depression, recurrent, chronic (HCC) 03/06/2016   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   Pseudophakia of both eyes 04/20/2015   Encounter for long-term (current) use of medications 11/21/2014   Hypertension 02/02/2014   Mixed hyperlipidemia 02/02/2014   CAD (coronary artery disease) 09/21/2013   BPH with obstruction/lower urinary tract symptoms 07/11/2013   Chronic prostatitis 07/11/2013   Reduced libido 07/11/2013   Disorder of male genital organs 07/11/2013   Erectile dysfunction 07/11/2013   Hypogonadism, 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   Other specified disorder of male genital organs(608.89) 07/11/2013   GERD (gastroesophageal reflux disease) 01/11/2013   Blake Baton, MS, CCC-SLP Speech-Language Pathologist  Arlana Lindau, CCC-SLP 04/01/2021, 12:15 PM  [place] [facility] MAIN [facility] SERVICES [address] [place], [place], [address] Phone: [phone]   Fax:  [phone]   Name: SAFWAN TOMEI MRN: [id number] Date of Birth: Aug 28, 1950

## 2021-04-04 ENCOUNTER — Other Ambulatory Visit: Payer: Self-pay

## 2021-04-04 ENCOUNTER — Ambulatory Visit: Payer: Medicare Other | Admitting: Speech Pathology

## 2021-04-04 DIAGNOSIS — R41841 Cognitive communication deficit: Secondary | ICD-10-CM | POA: Diagnosis not present

## 2021-04-04 NOTE — Therapy (Signed)
[facility] MAIN [facility] SERVICES [address] [place], [place], [address] Phone: [phone]   Fax:  [phone]  Speech Language Pathology Treatment  Patient Details  Name: Blake [name] MRN: [id number] Date of Birth: [birth year] Referring Provider (SLP): Dr. 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   Chronic left shoulder pain 04/03/2016   Chronic prostatitis 07/11/2013   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   Coronary artery disease    Dementia (HCC)    Depression    Disorder of male genital organs 07/11/2013   Dyspnea    Encounter for long-term (current) use of medications 11/21/2014   Erectile dysfunction 07/11/2013   Executive function deficit 03/06/2016   GERD (gastroesophageal reflux disease)    Headache    stress   Hearing loss    Hyperlipidemia    Hypertension    Hypogonadism, 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   Major depression, recurrent, chronic (HCC) 03/06/2016   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   Mixed hyperlipidemia 02/02/2014   Myocardial infarction (HCC) 08/2013   OCD (obsessive compulsive disorder)    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   Other specified disorder of male genital organs(608.89) 07/11/2013    Prostatic hypertrophy    Pseudophakia of both eyes 04/20/2015   Raynaud's disease    Reduced libido 07/11/2013   Repeated falls    weak left ankle   Scoliosis    Stroke (HCC) 2/16   "light"   Synovitis    knees, ankles    Past Surgical History:  Procedure Laterality Date   BACK SURGERY  2011   rods and screws   CLOSED REDUCTION NASAL FRACTURE Bilateral 06/11/2018   Procedure: CLOSED REDUCTION NASAL FRACTURE;  Surgeon: Vernie Murders, MD;  Location: [facility];  Service: ENT;  Laterality: Bilateral;   COLONOSCOPY  2015   CORONARY ANGIOPLASTY     2015 stent   CORONARY STENT INTERVENTION N/A 06/08/2019   Procedure: CORONARY STENT INTERVENTION;  Surgeon: Alwyn Pea, MD;  Location: [facility];  Service: Cardiovascular;  Laterality: N/A;   CYSTOSCOPY  1984   EYE SURGERY Bilateral 2005,2006,2009   detached retina, cataract   FOOT ARTHRODESIS Left 05/30/2015   Procedure: ARTHRODESIS FOOT STJ LT FOOT;  Surgeon: [name], DPM;  Location: [facility];  Service: Podiatry;  Laterality: Left;  WITH POPLITEAL BLOCK   FOOT SURGERY Left    HERNIA REPAIR Right 1969   inguinal   LEFT HEART CATH AND CORONARY ANGIOGRAPHY Left 06/08/2019   Procedure: LEFT HEART CATH AND CORONARY ANGIOGRAPHY;  Surgeon: Juliann Pares,  Bobbie Stack, MD;  Location: [facility] CV [facility];  Service: Cardiovascular;  Laterality: Left;   SEPTOPLASTY Bilateral 06/11/2018   Procedure: SEPTOPLASTY;  Surgeon: Vernie Murders, MD;  Location: [facility] OR[facility];  Service: ENT;  Laterality: Bilateral;   SHOULDER SURGERY Right 2004   skull surgery     TOE SURGERY Right 2009   TONSILLECTOMY  2008    There were no vitals filed for this visit.   Subjective Assessment - 04/04/21 1059     Subjective Arrived late due to rain    Patient is accompained by: Family member   wife [name]   Currently in Pain? No/denies                   ADULT SLP TREATMENT - 04/04/21 1059       General Information   Behavior/Cognition  Alert;Cooperative;Pleasant mood    HPI Patient is a 70 y.o. male referred by Dr. Sherryll Burger for cognitive training. 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 Also has hx of CVA (2018), HTN, depression, CHF, and hearing loss (bilateral OTE HA).      Treatment Provided   Treatment provided Cognitive-Linquistic      Cognitive-Linquistic Treatment   Treatment focused on Cognition;Patient/family/caregiver education    Skilled Treatment Reviewed strategies wife is using with pt at home; working on establishing areas of the home for pt to locate schedule information/reminders/personal belongings on each floor of the home. Provided memory strategies handout and reviewed this with pt and spouse. Gave examples of how pt could use each of the "WARM" strategies in personally relevant scenarios. Pt showed SLP his voice memos (>10,000). Suggested reviewing these (and his sticky notes) on a weekly or monthly basis and removing items that are no longer relevant: pt's terminology: IDM (It doesn't matter).      Assessment / Recommendations / Plan   Plan Continue with current plan of care      Progression Toward Goals   Progression toward goals Progressing toward goals              SLP Education - 04/04/21 1103     Education Details break down larger tasks, review memos and notes on a regular basis and discard items no longer needed    Person(s) Educated Patient;Spouse    Methods Explanation    Comprehension Verbalized understanding                SLP Long Term Goals - 03/21/21 1706       SLP LONG TERM GOAL #1   Title Patient, spouse will report carryover of communication effectiveness strategies to reduce frustration when breakdowns occur.    Time 4    Period Weeks    Status New    Target Date 05/20/21      SLP LONG  TERM GOAL #2   Title Patient will recall information about daily routine/schedule using his phone calendar with min cues.    Time 4    Period Weeks    Status New      SLP LONG TERM GOAL #3   Title Patient will participate actively in 5-8 minute discussion re: topic/article of interest using supportive conversation strategies such as written keywords.    Time 4    Period Weeks    Status New      SLP LONG TERM GOAL #4   Title Patient/spouse  will report daily completion of cognitive home activities 5/7 days.    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 Continued training in external aids and strategies to improve recall and reduce frustration with frequent communication breakdowns. I recommend brief course of ST focused on generating strategies/compensations to improve pt's function at home in tasks such as orientation and schedule management, as well as training pt/spouse in compensations to improve communication, resolve communication breakdowns and minimize pt frustration, and in implementing cognitive home program.    Speech Therapy Frequency 2x / week    Duration 4 weeks    Treatment/Interventions Environmental controls;SLP instruction and feedback;Functional tasks;Compensatory strategies;Patient/family education;Multimodal communcation approach;Internal/external aids    Potential to Achieve Goals Good    Potential Considerations Severity of impairments;Previous level of function   time post-onset   SLP Home Exercise Plan to be developed with pt/spouse in future sessions    Consulted and Agree with Plan of Care Patient;Family member/caregiver             Patient will benefit from skilled therapeutic intervention in order to improve the following deficits and impairments:    Cognitive communication deficit    Problem List Patient Active Problem List   Diagnosis Date Noted   S/P insertion of non-drug eluting coronary artery stent 06/08/2019   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) 06/05/2017   Chronic left shoulder pain 04/03/2016   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   Executive function deficit 03/06/2016   Major depression, recurrent, chronic (HCC) 03/06/2016   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   Pseudophakia of both eyes 04/20/2015   Encounter for long-term (current) use of medications 11/21/2014   Hypertension 02/02/2014   Mixed hyperlipidemia 02/02/2014   CAD (coronary artery disease) 09/21/2013   BPH with obstruction/lower urinary tract symptoms 07/11/2013   Chronic prostatitis 07/11/2013   Reduced libido 07/11/2013   Disorder of male genital organs 07/11/2013   Erectile dysfunction 07/11/2013   Hypogonadism, 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   Other specified disorder of male genital organs(608.89) 07/11/2013   GERD (gastroesophageal reflux disease) 01/11/2013   Rondel Baton, MS, CCC-SLP Speech-Language Pathologist  Arlana Lindau, CCC-SLP 04/04/2021, 11:04 AM  [place] [facility] MAIN [facility] SERVICES [address] [place], [place], [address] Phone: [phone]   Fax:  [phone]   Name: JULIOCESAR BLASIUS MRN: [id number] Date of Birth: [birth year]

## 2021-04-04 NOTE — Patient Instructions (Signed)

## 2021-04-09 ENCOUNTER — Ambulatory Visit: Payer: Medicare Other | Admitting: Speech Pathology

## 2021-04-09 ENCOUNTER — Other Ambulatory Visit: Payer: Self-pay

## 2021-04-09 DIAGNOSIS — R41841 Cognitive communication deficit: Secondary | ICD-10-CM | POA: Diagnosis not present

## 2021-04-09 NOTE — Therapy (Signed)
[facility] MAIN [facility] SERVICES [address] [place], [place], [address] Phone: [phone]   Fax:  [phone]  Speech Language Pathology Treatment  Patient Details  Name: Blake [name] MRN: [id number] Date of Birth: 19-Dec-1950 Referring Provider (SLP): Dr. 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   Chronic left shoulder pain 04/03/2016   Chronic prostatitis 07/11/2013   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   Coronary artery disease    Dementia (HCC)    Depression    Disorder of male genital organs 07/11/2013   Dyspnea    Encounter for long-term (current) use of medications 11/21/2014   Erectile dysfunction 07/11/2013   Executive function deficit 03/06/2016   GERD (gastroesophageal reflux disease)    Headache    stress   Hearing loss    Hyperlipidemia    Hypertension    Hypogonadism, 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   Major depression, recurrent, chronic (HCC) 03/06/2016   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   Mixed hyperlipidemia 02/02/2014   Myocardial infarction (HCC) 08/2013   OCD (obsessive compulsive disorder)    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   Other specified disorder of male genital organs(608.89) 07/11/2013    Prostatic hypertrophy    Pseudophakia of both eyes 04/20/2015   Raynaud's disease    Reduced libido 07/11/2013   Repeated falls    weak left ankle   Scoliosis    Stroke (HCC) 2/16   "light"   Synovitis    knees, ankles    Past Surgical History:  Procedure Laterality Date   BACK SURGERY  2011   rods and screws   CLOSED REDUCTION NASAL FRACTURE Bilateral 06/11/2018   Procedure: CLOSED REDUCTION NASAL FRACTURE;  Surgeon: Vernie Murders, MD;  Location: [facility];  Service: ENT;  Laterality: Bilateral;   COLONOSCOPY  2015   CORONARY ANGIOPLASTY     2015 stent   CORONARY STENT INTERVENTION N/A 06/08/2019   Procedure: CORONARY STENT INTERVENTION;  Surgeon: Alwyn Pea, MD;  Location: [facility];  Service: Cardiovascular;  Laterality: N/A;   CYSTOSCOPY  1984   EYE SURGERY Bilateral 2005,2006,2009   detached retina, cataract   FOOT ARTHRODESIS Left 05/30/2015   Procedure: ARTHRODESIS FOOT STJ LT FOOT;  Surgeon: [name], DPM;  Location: [facility];  Service: Podiatry;  Laterality: Left;  WITH POPLITEAL BLOCK   FOOT SURGERY Left    HERNIA REPAIR Right 1969   inguinal   LEFT HEART CATH AND CORONARY ANGIOGRAPHY Left 06/08/2019   Procedure: LEFT HEART CATH AND CORONARY ANGIOGRAPHY;  Surgeon: Juliann Pares,  Bobbie Stack, MD;  Location: [facility];  Service: Cardiovascular;  Laterality: Left;   SEPTOPLASTY Bilateral 06/11/2018   Procedure: SEPTOPLASTY;  Surgeon: Vernie Murders, MD;  Location: [facility];  Service: ENT;  Laterality: Bilateral;   SHOULDER SURGERY Right 2004   skull surgery     TOE SURGERY Right 2009   TONSILLECTOMY  2008    There were no vitals filed for this visit.   Subjective Assessment - 04/09/21 1027     Subjective Turns up his hearing aids    Patient is accompained by: Family member   wife Blake [name]   Currently in Pain? No/denies                   ADULT SLP TREATMENT - 04/09/21 1041       General Information   Behavior/Cognition  Alert;Cooperative;Pleasant mood    HPI Patient is a 70 y.o. male referred by Dr. Sherryll Burger for cognitive training. 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 Also has hx of CVA (2018), HTN, depression, CHF, and hearing loss (bilateral OTE HA).      Cognitive-Linquistic Treatment   Treatment focused on Cognition;Patient/family/caregiver education    Skilled Treatment Continue to develop and assess effectiveness of strategies to support pt's independence and reduce frustration at home. Using schedule board for tasks; worked with pt and spouse to develop plan for pt/spouse to develop plan together for the week, and for pt to record this on a weekly planner. Encouraged pt/spouse to develop the daily schedule in the morning and review the day's activities/plan for next day in the evening. Discussed barriers to pt acceptance such as anosagnosia as well as importance of involving pt in the plan/giving choice and autonomy to increase compliance. During communication breakdowns between pt and spouse, provided feedback/teaching on strategies for pt to use (rephrasing, request specific information, maximize eye contact) and spouse (monitor for attention, pause occasionally, check for understanding).      Assessment / Recommendations / Plan   Plan Continue with current plan of care      Progression Toward Goals   Progression toward goals Progressing toward goals              SLP Education - 04/09/21 1046     Education Details strategies for communication breakdowns, visual supports/reminders    Person(s) Educated Patient;Spouse    Methods Explanation    Comprehension Verbalized understanding                SLP Long Term Goals - 03/21/21 1706       SLP LONG TERM GOAL #1   Title Patient, spouse will report carryover of  communication effectiveness strategies to reduce frustration when breakdowns occur.    Time 4    Period Weeks    Status New    Target Date 05/20/21      SLP LONG TERM GOAL #2   Title Patient will recall information about daily routine/schedule using his phone calendar with min cues.    Time 4    Period Weeks    Status New      SLP LONG TERM GOAL #3   Title Patient will participate actively in 5-8 minute discussion re: topic/article of interest using supportive conversation strategies such as written keywords.    Time 4    Period Weeks    Status New  SLP LONG TERM GOAL #4   Title Patient/spouse will report daily completion of cognitive home activities 5/7 days.    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 Continued training in external aids and strategies to improve recall and reduce frustration with frequent communication breakdowns. I recommend brief course of ST focused on generating strategies/compensations to improve pt's function at home in tasks such as orientation and schedule management, as well as training pt/spouse in compensations to improve communication, resolve communication breakdowns and minimize pt frustration, and in implementing cognitive home program.    Speech Therapy Frequency 2x / week    Duration 4 weeks    Treatment/Interventions Environmental controls;SLP instruction and feedback;Functional tasks;Compensatory strategies;Patient/family education;Multimodal communcation approach;Internal/external aids    Potential to Achieve Goals Good    Potential Considerations Severity of impairments;Previous level of function   time post-onset   SLP Home Exercise Plan to be developed with pt/spouse in future sessions    Consulted and Agree with Plan of Care  Patient;Family member/caregiver             Patient will benefit from skilled therapeutic intervention in order to improve the following deficits and impairments:   Cognitive communication deficit    Problem List Patient Active Problem List   Diagnosis Date Noted   S/P insertion of non-drug eluting coronary artery stent 06/08/2019   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) 06/05/2017   Chronic left shoulder pain 04/03/2016   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   Executive function deficit 03/06/2016   Major depression, recurrent, chronic (HCC) 03/06/2016   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   Pseudophakia of both eyes 04/20/2015   Encounter for long-term (current) use of medications 11/21/2014   Hypertension 02/02/2014   Mixed hyperlipidemia 02/02/2014   CAD (coronary artery disease) 09/21/2013   BPH with obstruction/lower urinary tract symptoms 07/11/2013   Chronic prostatitis 07/11/2013   Reduced libido 07/11/2013   Disorder of male genital organs 07/11/2013   Erectile dysfunction 07/11/2013   Hypogonadism, 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   Other specified disorder of male genital organs(608.89) 07/11/2013   GERD (gastroesophageal reflux disease) 01/11/2013   Rondel Baton, MS, CCC-SLP Speech-Language Pathologist  Arlana Lindau, CCC-SLP 04/09/2021, 10:48 AM  [place] [facility] MAIN [facility] SERVICES [address] [place], [place], [address] Phone: [phone]   Fax:  [phone]   Name: Blake [name] MRN: [id number] Date of Birth: 12/10/1950

## 2021-04-11 ENCOUNTER — Other Ambulatory Visit: Payer: Self-pay

## 2021-04-11 ENCOUNTER — Ambulatory Visit: Payer: Medicare Other | Admitting: Speech Pathology

## 2021-04-11 DIAGNOSIS — R41841 Cognitive communication deficit: Secondary | ICD-10-CM | POA: Diagnosis not present

## 2021-04-11 NOTE — Patient Instructions (Signed)
Program the following into your calendar on your phone as recurring activities  -Sleep and wake times -nap times -events that occur on a weekly or routine basis such as exercise, taking out the trash, chores -appointments -outings  On a daily basis: review the weather together, talk about your plans for the day. [place] make a note in your calendar about  how to dress that day based on the forecast and your activities that day Add in any chores or activities that aren't already on the calendar Next step on any project you want to accomplish  Make sure to set an alert for any [name]-based activity

## 2021-04-11 NOTE — Therapy (Signed)
[place] [facility] MAIN [facility] SERVICES [address] [place], [place], [address] Phone: [phone]   Fax:  [phone]  Speech Language Pathology Treatment  Patient Details  Name: Blake [name] MRN: [id number] Date of Birth: 1950/09/27 Referring Provider (SLP): Dr. 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   Chronic left shoulder pain 04/03/2016   Chronic prostatitis 07/11/2013   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   Coronary artery disease    Dementia (HCC)    Depression    Disorder of male genital organs 07/11/2013   Dyspnea    Encounter for long-term (current) use of medications 11/21/2014   Erectile dysfunction 07/11/2013   Executive function deficit 03/06/2016   GERD (gastroesophageal reflux disease)    Headache    stress   Hearing loss    Hyperlipidemia    Hypertension    Hypogonadism, 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   Major depression, recurrent, chronic (HCC) 03/06/2016   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   Mixed hyperlipidemia 02/02/2014   Myocardial infarction (HCC) 08/2013   OCD (obsessive compulsive disorder)    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   Other specified disorder of male genital organs(608.89) 07/11/2013    Prostatic hypertrophy    Pseudophakia of both eyes 04/20/2015   Raynaud's disease    Reduced libido 07/11/2013   Repeated falls    weak left ankle   Scoliosis    Stroke (HCC) 2/16   "light"   Synovitis    knees, ankles    Past Surgical History:  Procedure Laterality Date   BACK SURGERY  2011   rods and screws   CLOSED REDUCTION NASAL FRACTURE Bilateral 06/11/2018   Procedure: CLOSED REDUCTION NASAL FRACTURE;  Surgeon: Vernie Murders, MD;  Location: [facility];  Service: ENT;  Laterality: Bilateral;   COLONOSCOPY  2015   CORONARY ANGIOPLASTY     2015 stent   CORONARY STENT INTERVENTION N/A 06/08/2019   Procedure: CORONARY STENT INTERVENTION;  Surgeon: Alwyn Pea, MD;  Location: [facility];  Service: Cardiovascular;  Laterality: N/A;   CYSTOSCOPY  1984   EYE SURGERY Bilateral 2005,2006,2009   detached retina, cataract   FOOT ARTHRODESIS Left 05/30/2015   Procedure: ARTHRODESIS FOOT STJ LT FOOT;  Surgeon: [name], DPM;  Location: [facility];  Service: Podiatry;  Laterality: Left;  WITH POPLITEAL BLOCK   FOOT SURGERY Left    HERNIA REPAIR Right 1969   inguinal   LEFT HEART CATH AND CORONARY ANGIOGRAPHY Left 06/08/2019   Procedure: LEFT HEART CATH AND CORONARY ANGIOGRAPHY;  Surgeon: Juliann Pares,  Bobbie Stack, MD;  Location: [facility] CV [facility];  Service: Cardiovascular;  Laterality: Left;   SEPTOPLASTY Bilateral 06/11/2018   Procedure: SEPTOPLASTY;  Surgeon: Vernie Murders, MD;  Location: [facility];  Service: ENT;  Laterality: Bilateral;   SHOULDER SURGERY Right 2004   skull surgery     TOE SURGERY Right 2009   TONSILLECTOMY  2008    There were no vitals filed for this visit.   Subjective Assessment - 04/11/21 1126     Subjective Pt asks wife to update SLP on happenings since previous session    Patient is accompained by: Family member   wife [name]   Currently in Pain? No/denies                   ADULT SLP TREATMENT - 04/11/21 1127        General Information   Behavior/Cognition Alert;Cooperative;Pleasant mood    HPI Patient is a 70 y.o. male referred by Dr. Sherryll Burger for cognitive training. 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 Also has hx of CVA (2018), HTN, depression, CHF, and hearing loss (bilateral OTE HA).      Cognitive-Linquistic Treatment   Treatment focused on Cognition;Patient/family/caregiver education    Skilled Treatment Pt/spouse did not get a planner. Pt expressed wanting to use his phone for the schedule. Discussed benefits of writing vs using the phone, including advantage that pt has his phone with him most of the time vs a schedule which would be difficult for him to keep up with. Worked with pt to set up recurring events for sleep and wake times. Education with pt and spouse to set up other recurring events, and then make time daily to discuss the schedule and have pt add in events for dressing based on the weather/day's activities and any chores, miscellaneous events/tasks. Pt required occasional min-mod A for attention to task, attention to detail. Explained that pt's wife will need to be involved daily and use routine to reinforce schedule habit and eventually increase independence.      Assessment / Recommendations / Plan   Plan Continue with current plan of care      Progression Toward Goals   Progression toward goals Progressing toward goals              SLP Education - 04/11/21 1131     Education Details adapting strategies/procedures so they are more effective/less stressful for pt    Person(s) Educated Patient;Spouse    Methods Explanation    Comprehension Verbalized understanding;Need further instruction                SLP Long Term Goals - 03/21/21 1706       SLP LONG TERM GOAL #1   Title Patient, spouse  will report carryover of communication effectiveness strategies to reduce frustration when breakdowns occur.    Time 4    Period Weeks    Status New    Target Date 05/20/21      SLP LONG TERM GOAL #2   Title Patient will recall information about daily routine/schedule using his phone calendar with min cues.    Time 4    Period Weeks    Status New      SLP LONG TERM GOAL #3   Title Patient will participate actively in 5-8 minute discussion re: topic/article of interest using supportive conversation strategies such  as written keywords.    Time 4    Period Weeks    Status New      SLP LONG TERM GOAL #4   Title Patient/spouse will report daily completion of cognitive home activities 5/7 days.    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 Continued training in external aids and strategies to improve recall and reduce frustration with frequent communication breakdowns. Pt reluctant to adopt paper system; worked with pt and spouse today to use pt's phone for scheduling purposes. I recommend brief course of ST focused on generating strategies/compensations to improve pt's function at home in tasks such as orientation and schedule management, as well as training pt/spouse in compensations to improve communication, resolve communication breakdowns and minimize pt frustration, and in implementing cognitive home program.    Speech Therapy Frequency 2x / week    Duration 4 weeks    Treatment/Interventions Environmental controls;SLP instruction and feedback;Functional tasks;Compensatory strategies;Patient/family education;Multimodal communcation approach;Internal/external aids    Potential to Achieve Goals Good    Potential Considerations Severity of impairments;Previous level of function   time  post-onset   SLP Home Exercise Plan to be developed with pt/spouse in future sessions    Consulted and Agree with Plan of Care Patient;Family member/caregiver             Patient will benefit from skilled therapeutic intervention in order to improve the following deficits and impairments:   Cognitive communication deficit    Problem List Patient Active Problem List   Diagnosis Date Noted   S/P insertion of non-drug eluting coronary artery stent 06/08/2019   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) 06/05/2017   Chronic left shoulder pain 04/03/2016   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   Executive function deficit 03/06/2016   Major depression, recurrent, chronic (HCC) 03/06/2016   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   Pseudophakia of both eyes 04/20/2015   Encounter for long-term (current) use of medications 11/21/2014   Hypertension 02/02/2014   Mixed hyperlipidemia 02/02/2014   CAD (coronary artery disease) 09/21/2013   BPH with obstruction/lower urinary tract symptoms 07/11/2013   Chronic prostatitis 07/11/2013   Reduced libido 07/11/2013   Disorder of male genital organs 07/11/2013   Erectile dysfunction 07/11/2013   Hypogonadism, 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   Other specified disorder of male genital organs(608.89) 07/11/2013   GERD (gastroesophageal reflux disease) 01/11/2013   Rondel Baton, MS, CCC-SLP Speech-Language Pathologist  Arlana Lindau, CCC-SLP 04/11/2021, 11:33 AM  [place] [facility] MAIN [facility] SERVICES [address] [place], [place], [address] Phone: [phone]   Fax:  [phone]   Name: Blake [name] MRN: [id number] Date of Birth: 1951-03-14

## 2021-04-23 ENCOUNTER — Ambulatory Visit: Payer: Medicare Other | Attending: Neurology | Admitting: Speech Pathology

## 2021-04-23 ENCOUNTER — Other Ambulatory Visit: Payer: Self-pay

## 2021-04-23 DIAGNOSIS — R41841 Cognitive communication deficit: Secondary | ICD-10-CM | POA: Diagnosis not present

## 2021-04-23 NOTE — Therapy (Signed)
[place] [facility] MAIN [facility] SERVICES [address] [place], [place], [address] Phone: [phone]   Fax:  [phone]  Speech Language Pathology Treatment  Patient Details  Name: Blake [name] MRN: [id number] Date of Birth: 1950-12-06 Referring Provider (SLP): Dr. 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   Chronic left shoulder pain 04/03/2016   Chronic prostatitis 07/11/2013   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   Coronary artery disease    Dementia (HCC)    Depression    Disorder of male genital organs 07/11/2013   Dyspnea    Encounter for long-term (current) use of medications 11/21/2014   Erectile dysfunction 07/11/2013   Executive function deficit 03/06/2016   GERD (gastroesophageal reflux disease)    Headache    stress   Hearing loss    Hyperlipidemia    Hypertension    Hypogonadism, 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   Major depression, recurrent, chronic (HCC) 03/06/2016   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   Mixed hyperlipidemia 02/02/2014   Myocardial infarction (HCC) 08/2013   OCD (obsessive compulsive disorder)    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   Other specified disorder of male genital organs(608.89) 07/11/2013    Prostatic hypertrophy    Pseudophakia of both eyes 04/20/2015   Raynaud's disease    Reduced libido 07/11/2013   Repeated falls    weak left ankle   Scoliosis    Stroke (HCC) 2/16   "light"   Synovitis    knees, ankles    Past Surgical History:  Procedure Laterality Date   BACK SURGERY  2011   rods and screws   CLOSED REDUCTION NASAL FRACTURE Bilateral 06/11/2018   Procedure: CLOSED REDUCTION NASAL FRACTURE;  Surgeon: Vernie Murders, MD;  Location: [facility];  Service: ENT;  Laterality: Bilateral;   COLONOSCOPY  2015   CORONARY ANGIOPLASTY     2015 stent   CORONARY STENT INTERVENTION N/A 06/08/2019   Procedure: CORONARY STENT INTERVENTION;  Surgeon: Alwyn Pea, MD;  Location: [facility];  Service: Cardiovascular;  Laterality: N/A;   CYSTOSCOPY  1984   EYE SURGERY Bilateral 2005,2006,2009   detached retina, cataract   FOOT ARTHRODESIS Left 05/30/2015   Procedure: ARTHRODESIS FOOT STJ LT FOOT;  Surgeon: [name], DPM;  Location: [facility];  Service: Podiatry;  Laterality: Left;  WITH POPLITEAL BLOCK   FOOT SURGERY Left    HERNIA REPAIR Right 1969   inguinal   LEFT HEART CATH AND CORONARY ANGIOGRAPHY Left 06/08/2019   Procedure: LEFT HEART CATH AND CORONARY ANGIOGRAPHY;  Surgeon: Juliann Pares,  Bobbie Stack, MD;  Location: [facility];  Service: Cardiovascular;  Laterality: Left;   SEPTOPLASTY Bilateral 06/11/2018   Procedure: SEPTOPLASTY;  Surgeon: Vernie Murders, MD;  Location: [facility];  Service: ENT;  Laterality: Bilateral;   SHOULDER SURGERY Right 2004   skull surgery     TOE SURGERY Right 2009   TONSILLECTOMY  2008    There were no vitals filed for this visit.   Subjective Assessment - 04/23/21 1602     Subjective Fell while bending over on his patio and bumped head on concrete    Currently in Pain? Yes    Pain Score 5     Pain Location Foot    Pain Orientation Right;Left                   ADULT SLP TREATMENT - 04/23/21 1603        General Information   Behavior/Cognition Alert;Cooperative;Pleasant mood    HPI Patient is a 71 y.o. male referred by Dr. Sherryll Burger for cognitive training. 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 Also has hx of CVA (2018), HTN, depression, CHF, and hearing loss (bilateral OTE HA).      Cognitive-Linquistic Treatment   Treatment focused on Cognition;Patient/family/caregiver education    Skilled Treatment Pt wrote a daily schedule in notes app on his phone, has not been reviewing wife's calendar or  adding new events (only nap, MWF workout, wake/sleep times present). Demonstrated using calendar to add items; pt agreeable to setting an alert to add things to his phone in the evenings and in the mornings. Pt able to return demonstration by adding workouts, walks, today's appointment and an upcoming funeral with min A.      Assessment / Recommendations / Plan   Plan Continue with current plan of care      Progression Toward Goals   Progression toward goals Progressing toward goals              SLP Education - 04/23/21 1606     Education Details rationale for keeping schedule    Person(s) Educated Patient;Spouse    Methods Explanation    Comprehension Verbalized understanding                SLP Long Term Goals - 03/21/21 1706       SLP LONG TERM GOAL #1   Title Patient, spouse will report carryover of communication effectiveness strategies to reduce frustration when breakdowns occur.    Time 4    Period Weeks    Status New    Target Date 05/20/21      SLP LONG TERM GOAL #2   Title Patient will recall information about daily routine/schedule using his phone calendar with min cues.    Time 4    Period Weeks    Status New      SLP LONG TERM GOAL #3   Title Patient will participate actively in  5-8 minute discussion re: topic/article of interest using supportive conversation strategies such as written keywords.    Time 4    Period Weeks    Status New      SLP LONG TERM GOAL #4   Title Patient/spouse will report daily completion of cognitive home activities 5/7 days.    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 Continued training in external aids and strategies to improve recall and reduce frustration with frequent communication breakdowns. Pt reluctant to adopt paper system; min cues necessary to add items to phone calendar today. Pt receptive to using alerts to help him be consistent in updating/reviewing his schedule. I recommend brief course of ST focused on generating strategies/compensations to improve pt's function at home in tasks such as orientation and schedule management, as well as training pt/spouse in compensations to improve communication, resolve communication breakdowns and minimize pt frustration, and in implementing cognitive home program.    Speech Therapy Frequency 2x / week    Duration 4 weeks    Treatment/Interventions Environmental controls;SLP instruction and feedback;Functional tasks;Compensatory strategies;Patient/family education;Multimodal communcation approach;Internal/external aids    Potential to Achieve Goals Good    Potential Considerations Severity of impairments;Previous level of function   time post-onset   SLP Home Exercise Plan to be developed with pt/spouse in future sessions    Consulted and Agree with Plan of Care Patient;Family member/caregiver             Patient will benefit from skilled therapeutic intervention in order to improve the following deficits and impairments:   Cognitive communication deficit    Problem  List Patient Active Problem List   Diagnosis Date Noted   S/P insertion of non-drug eluting coronary artery stent 06/08/2019   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) 06/05/2017   Chronic left shoulder pain 04/03/2016   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   Executive function deficit 03/06/2016   Major depression, recurrent, chronic (HCC) 03/06/2016   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   Pseudophakia of both eyes 04/20/2015   Encounter for long-term (current) use of medications 11/21/2014   Hypertension 02/02/2014   Mixed hyperlipidemia 02/02/2014   CAD (coronary artery disease) 09/21/2013   BPH with obstruction/lower urinary tract symptoms 07/11/2013   Chronic prostatitis 07/11/2013   Reduced libido 07/11/2013   Disorder of male genital organs 07/11/2013   Erectile dysfunction 07/11/2013   Hypogonadism, 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   Other specified disorder of male genital organs(608.89) 07/11/2013   GERD (gastroesophageal reflux disease) 01/11/2013   Rondel Baton, MS, CCC-SLP Speech-Language Pathologist  Arlana Lindau, CCC-SLP 04/23/2021, 4:07 PM  [place] [facility] MAIN [facility] SERVICES [address] [place], [place], [address] Phone: [phone]   Fax:  [phone]   Name: Blake [name] MRN: [id number] Date of Birth: January 20, 1951

## 2021-05-05 ENCOUNTER — Other Ambulatory Visit: Payer: Self-pay | Admitting: Urology

## 2021-05-05 DIAGNOSIS — E291 Testicular hypofunction: Secondary | ICD-10-CM

## 2021-05-09 ENCOUNTER — Other Ambulatory Visit: Payer: Self-pay | Admitting: Family Medicine

## 2021-05-09 ENCOUNTER — Ambulatory Visit: Payer: Medicare Other | Admitting: Speech Pathology

## 2021-05-09 ENCOUNTER — Other Ambulatory Visit: Payer: Self-pay

## 2021-05-09 DIAGNOSIS — R41841 Cognitive communication deficit: Secondary | ICD-10-CM | POA: Diagnosis not present

## 2021-05-09 DIAGNOSIS — E291 Testicular hypofunction: Secondary | ICD-10-CM

## 2021-05-09 NOTE — Therapy (Signed)
[facility] MAIN [facility] SERVICES [address] [place], [place], [address] Phone: [phone]   Fax:  [phone]  Speech Language Pathology Treatment  Patient Details  Name: Blake [name] MRN: [id number] Date of Birth: [birth year] Referring Provider (SLP): Dr. 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   Chronic left shoulder pain 04/03/2016   Chronic prostatitis 07/11/2013   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   Coronary artery disease    Dementia (HCC)    Depression    Disorder of male genital organs 07/11/2013   Dyspnea    Encounter for long-term (current) use of medications 11/21/2014   Erectile dysfunction 07/11/2013   Executive function deficit 03/06/2016   GERD (gastroesophageal reflux disease)    Headache    stress   Hearing loss    Hyperlipidemia    Hypertension    Hypogonadism, 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   Major depression, recurrent, chronic (HCC) 03/06/2016   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   Mixed hyperlipidemia 02/02/2014   Myocardial infarction (HCC) 08/2013   OCD (obsessive compulsive disorder)    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   Other specified disorder of male genital organs(608.89) 07/11/2013    Prostatic hypertrophy    Pseudophakia of both eyes 04/20/2015   Raynaud's disease    Reduced libido 07/11/2013   Repeated falls    weak left ankle   Scoliosis    Stroke (HCC) 2/16   "light"   Synovitis    knees, ankles    Past Surgical History:  Procedure Laterality Date   BACK SURGERY  2011   rods and screws   CLOSED REDUCTION NASAL FRACTURE Bilateral 06/11/2018   Procedure: CLOSED REDUCTION NASAL FRACTURE;  Surgeon: Vernie Murders, MD;  Location: [facility];  Service: ENT;  Laterality: Bilateral;   COLONOSCOPY  2015   CORONARY ANGIOPLASTY     2015 stent   CORONARY STENT INTERVENTION N/A 06/08/2019   Procedure: CORONARY STENT INTERVENTION;  Surgeon: Alwyn Pea, MD;  Location: [facility];  Service: Cardiovascular;  Laterality: N/A;   CYSTOSCOPY  1984   EYE SURGERY Bilateral 2005,2006,2009   detached retina, cataract   FOOT ARTHRODESIS Left 05/30/2015   Procedure: ARTHRODESIS FOOT STJ LT FOOT;  Surgeon: [name], DPM;  Location: [facility];  Service: Podiatry;  Laterality: Left;  WITH POPLITEAL BLOCK   FOOT SURGERY Left    HERNIA REPAIR Right 1969   inguinal   LEFT HEART CATH AND CORONARY ANGIOGRAPHY Left 06/08/2019   Procedure: LEFT HEART CATH AND CORONARY ANGIOGRAPHY;  Surgeon: Juliann Pares,  Bobbie Stack, MD;  Location: [facility];  Service: Cardiovascular;  Laterality: Left;   SEPTOPLASTY Bilateral 06/11/2018   Procedure: SEPTOPLASTY;  Surgeon: Vernie Murders, MD;  Location: [facility];  Service: ENT;  Laterality: Bilateral;   SHOULDER SURGERY Right 2004   skull surgery     TOE SURGERY Right 2009   TONSILLECTOMY  2008    There were no vitals filed for this visit.   Subjective Assessment - 05/09/21 1726     Subjective Pt has not been logging new schedule events in his phone    Patient is accompained by: Family member   [name]   Currently in Pain? No/denies                   ADULT SLP TREATMENT - 05/09/21 1727       General  Information   Behavior/Cognition Alert;Cooperative;Pleasant mood    HPI Patient is a 71 y.o. male referred by Dr. Sherryll Burger for cognitive training. 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 Also has hx of CVA (2018), HTN, depression, CHF, and hearing loss (bilateral OTE HA).      Cognitive-Linquistic Treatment   Treatment focused on Cognition;Patient/family/caregiver education    Skilled Treatment Worked with pt and spouse to problem solve issues around carryover/use of schedule at home. Pt required frequent redirection to topic. Pt was receptive of using photocopy of wife's schedule and logging these activities in his phone calendar during his morning routine at [facility]. Set alert in his phone for this. Spouse requesting feedback on how to manage schedule changes; encouraged providing written list to decrease verbalizations in stressful moments and to provide pt with more independence by making choices.      Assessment / Recommendations / Plan   Plan Continue with current plan of care      Progression Toward Goals   Progression toward goals Progressing toward goals              SLP Education - 05/09/21 1738     Education Details written choices, build on successful routines    Person(s) Educated Patient;Spouse    Methods Explanation    Comprehension Verbalized understanding                SLP Long Term Goals - 03/21/21 1706       SLP LONG TERM GOAL #1   Title Patient, spouse will report carryover of communication effectiveness strategies to reduce frustration when breakdowns occur.    Time 4    Period Weeks    Status New    Target Date 05/20/21      SLP LONG TERM GOAL #2   Title Patient will recall information about daily routine/schedule using his phone calendar with min cues.    Time 4     Period Weeks    Status New      SLP LONG TERM GOAL #3   Title Patient will participate actively in 5-8 minute discussion re: topic/article of interest using supportive conversation strategies such as written keywords.    Time 4    Period Weeks    Status New      SLP LONG TERM GOAL #4   Title Patient/spouse will report daily completion of cognitive home activities 5/7 days.    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 Continued training in external aids and strategies to improve recall and reduce frustration with frequent communication breakdowns. Pt reluctant to adopt paper system; min cues necessary to add items to phone calendar today. Pt receptive to using alerts to help him be consistent in updating/reviewing his schedule. Working with pt/spouse to troubleshoot difficulties with carryover. I recommend brief course of ST focused on generating strategies/compensations to improve pt's function at home in tasks such as orientation and schedule management, as well as training pt/spouse in compensations to improve communication, resolve communication breakdowns and minimize pt frustration, and in implementing cognitive home program. Anticipate d/c next session    Speech Therapy Frequency 1x /week    Duration 8 weeks    Treatment/Interventions Environmental controls;SLP instruction and feedback;Functional tasks;Compensatory strategies;Patient/family education;Multimodal communcation approach;Internal/external aids    Potential to Achieve Goals Good    Potential Considerations Severity of impairments;Previous level of function   time post-onset   SLP Home Exercise Plan to be developed with pt/spouse in future sessions    Consulted and Agree with Plan of Care Patient;Family member/caregiver              Patient will benefit from skilled therapeutic intervention in order to improve the following deficits and impairments:   Cognitive communication deficit    Problem List Patient Active Problem List   Diagnosis Date Noted   S/P insertion of non-drug eluting coronary artery stent 06/08/2019   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) 06/05/2017   Chronic left shoulder pain 04/03/2016   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   Executive function deficit 03/06/2016   Major depression, recurrent, chronic (HCC) 03/06/2016   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   Pseudophakia of both eyes 04/20/2015   Encounter for long-term (current) use of medications 11/21/2014   Hypertension 02/02/2014   Mixed hyperlipidemia 02/02/2014   CAD (coronary artery disease) 09/21/2013   BPH with obstruction/lower urinary tract symptoms 07/11/2013   Chronic prostatitis 07/11/2013   Reduced libido 07/11/2013   Disorder of male genital organs 07/11/2013   Erectile dysfunction 07/11/2013   Hypogonadism, 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   Other specified disorder of male genital organs(608.89) 07/11/2013   GERD (gastroesophageal reflux disease) 01/11/2013   Rondel Baton, MS, CCC-SLP Speech-Language Pathologist  Arlana Lindau, CCC-SLP 05/09/2021, 5:42 PM  [facility] MAIN [facility] SERVICES [address] [place], [place], [address] Phone: [phone]   Fax:  [phone]   Name: MAZEN PHO MRN: [id number] Date of Birth: 26-Sep-1950

## 2021-05-15 ENCOUNTER — Other Ambulatory Visit: Payer: Self-pay

## 2021-05-15 ENCOUNTER — Ambulatory Visit: Payer: Medicare Other | Admitting: Speech Pathology

## 2021-05-15 DIAGNOSIS — R41841 Cognitive communication deficit: Secondary | ICD-10-CM | POA: Diagnosis not present

## 2021-05-15 NOTE — Therapy (Signed)
[place] MAIN [facility] SERVICES [address] [place], [place], [address] Phone: [phone]   Fax:  [phone]  Speech Language Pathology Treatment and Discharge Summary  Patient Details  Name: Blake [name] MRN: [id number] Date of Birth: 1950-08-04 Referring Provider (SLP): Dr. 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   Chronic left shoulder pain 04/03/2016   Chronic prostatitis 07/11/2013   Cognitive deficit as late effect of traumatic brain injury ([place]) 03/06/2016   Coronary artery disease    Dementia ([place])    Depression    Disorder of male genital organs 07/11/2013   Dyspnea    Encounter for long-term (current) use of medications 11/21/2014   Erectile dysfunction 07/11/2013   Executive function deficit 03/06/2016   GERD (gastroesophageal reflux disease)    Headache    stress   Hearing loss    Hyperlipidemia    Hypertension    Hypogonadism, 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   Injury of frontal lobe ([place])    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   Major depression, recurrent, chronic ([place]) 03/06/2016   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   Mixed hyperlipidemia 02/02/2014   Myocardial infarction ([place]) 08/2013   OCD (obsessive compulsive disorder)    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   Other specified disorder of  male genital organs(608.89) 07/11/2013   Prostatic hypertrophy    Pseudophakia of both eyes 04/20/2015   Raynaud's disease    Reduced libido 07/11/2013   Repeated falls    weak left ankle   Scoliosis    Stroke ([place]) 2/16   "light"   Synovitis    knees, ankles    Past Surgical History:  Procedure Laterality Date   BACK SURGERY  2011   rods and screws   CLOSED REDUCTION NASAL FRACTURE Bilateral 06/11/2018   Procedure: CLOSED REDUCTION NASAL FRACTURE;  Surgeon: Margaretha Sheffield, MD;  Location: [facility];  Service: ENT;  Laterality: Bilateral;   COLONOSCOPY  2015   CORONARY ANGIOPLASTY     2015 stent   CORONARY STENT INTERVENTION N/A 06/08/2019   Procedure: CORONARY STENT INTERVENTION;  Surgeon: Yolonda Kida, MD;  Location: [facility];  Service: Cardiovascular;  Laterality: N/A;   CYSTOSCOPY  1984   EYE SURGERY Bilateral 2005,2006,2009   detached retina, cataract   FOOT ARTHRODESIS Left 05/30/2015   Procedure: ARTHRODESIS FOOT STJ LT FOOT;  Surgeon: Samara Deist, DPM;  Location: [place];  Service: Podiatry;  Laterality: Left;  WITH POPLITEAL BLOCK   FOOT SURGERY Left    HERNIA REPAIR Right 1969   inguinal   LEFT HEART CATH AND CORONARY ANGIOGRAPHY Left 06/08/2019   Procedure: LEFT HEART  CATH AND CORONARY ANGIOGRAPHY;  Surgeon: Yolonda Kida, MD;  Location: [facility];  Service: Cardiovascular;  Laterality: Left;   SEPTOPLASTY Bilateral 06/11/2018   Procedure: SEPTOPLASTY;  Surgeon: Margaretha Sheffield, MD;  Location: [facility];  Service: ENT;  Laterality: Bilateral;   SHOULDER SURGERY Right 2004   skull surgery     TOE SURGERY Right 2009   TONSILLECTOMY  2008    There were no vitals filed for this visit.   Subjective Assessment - 05/15/21 1736     Subjective Pt has been leaving schedule paper at home    Patient is accompained by: Family member    Currently in Pain? No/denies                   ADULT SLP TREATMENT - 05/15/21 1736        General Information   Behavior/Cognition Alert;Cooperative;Pleasant mood    HPI Patient is a 71 y.o. male referred by Dr. Manuella Ghazi for cognitive training. 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 Also has hx of CVA (2018), HTN, depression, CHF, and hearing loss (bilateral OTE HA).      Treatment Provided   Treatment provided Cognitive-Linquistic      Cognitive-Linquistic Treatment   Treatment focused on Cognition;Patient/family/caregiver education    Skilled Treatment Suggested spouse hand pt his schedule as he gets out of the car vs having him try to remember to take it with him when he goes to [employer]. Focused session on review of caregiver support techniques to assist with memory impairment and participation in conversations (communication effectiveness, use of speech-to-text to aid comprehension for group conversations). Spouse reported episode of confusion, behavior change, fatigue 2 weeks ago. Discussed with pt's cardiologist who questioned TIA. Reviewed signs of stroke and encouraged spouse to place call to Dr. Trena Platt office. Reinforced to go to the hospital if pt shows any signs of stroke. Shared information re: community resources (stroke support group).      Assessment / Recommendations / Plan   Plan Continue with current plan of care      Progression Toward Goals   Progression toward goals --   Goals partially met, pt d/c             SLP Education - 05/15/21 1744     Education Details signs of stroke    Person(s) Educated Patient;Spouse    Methods Explanation    Comprehension Verbalized understanding                SLP Long Term Goals - 05/15/21 1748       SLP LONG TERM GOAL #1   Title Patient, spouse will report carryover of communication effectiveness strategies to reduce frustration  when breakdowns occur.    Time 4    Period Weeks    Status Achieved      SLP LONG TERM GOAL #2   Title Patient will recall information about daily routine/schedule using his phone calendar with min cues.    Time 4    Period Weeks    Status Partially Met      SLP LONG TERM GOAL #3   Title Patient will participate actively in 5-8 minute discussion re: topic/article of interest using supportive conversation strategies such as written keywords.    Time 4    Period Weeks    Status Deferred   for  focus on schedule/routine     SLP LONG TERM GOAL #4   Title Patient/spouse will report daily completion of cognitive home activities 5/7 days.    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 Spouse demonstrates understanding of how to make adaptations to maximize pt's independence as well as importance of using a schedule with variety of activities for daily mental stimulation. Spouse communicating changes in writing (text, or on schedule) to reduce pt frustration with changes/communication breakdowns. Pt ability to carry over compensations is limited by his decreased awareness/insight into deficits as well as reduced cognitive flexibility. Pt will require ongoing support from caregiver to carry over use of trained strategies. Pt discharged today with max rehab potential reached.    Speech Therapy Frequency --   d/c   Duration --   d/c   Treatment/Interventions Environmental controls;SLP instruction and feedback;Functional tasks;Compensatory strategies;Patient/family education;Multimodal communcation approach;Internal/external aids    Potential to Achieve Goals Good    Potential Considerations Severity of impairments;Previous level of function   time post-onset   SLP Home Exercise Plan to be  developed with pt/spouse in future sessions    Consulted and Agree with Plan of Care Patient;Family member/caregiver             Patient will benefit from skilled therapeutic intervention in order to improve the following deficits and impairments:   Cognitive communication deficit    Problem List Patient Active Problem List   Diagnosis Date Noted   S/P insertion of non-drug eluting coronary artery stent 06/08/2019   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) 06/05/2017   Chronic left shoulder pain 04/03/2016   Cognitive deficit as late effect of traumatic brain injury ([place]) 03/06/2016   Executive function deficit 03/06/2016   Major depression, recurrent, chronic ([place]) 03/06/2016   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   Pseudophakia of both eyes 04/20/2015   Encounter for long-term (current) use of medications 11/21/2014   Hypertension 02/02/2014   Mixed hyperlipidemia 02/02/2014   CAD (coronary artery disease) 09/21/2013   BPH with obstruction/lower urinary tract symptoms 07/11/2013   Chronic prostatitis 07/11/2013   Reduced libido 07/11/2013   Disorder of male genital organs 07/11/2013   Erectile dysfunction 07/11/2013   Hypogonadism, 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   Other specified disorder of male genital organs(608.89) 07/11/2013   GERD (gastroesophageal reflux disease) 01/11/2013   SPEECH THERAPY DISCHARGE SUMMARY  Visits from Start of Care: 9  Current functional level related to goals / functional outcomes: Pt met 2/4 LTGs. Pt continues to require support from spouse for following daily schedule.   Remaining deficits: Deficits in memory, insight, awareness, attention    Education / Equipment: Use of schedule/routine, communication strategies, alerts/aids/assistive technology for hearing impairments   Patient agrees to discharge. Patient goals were partially met. Patient is  being discharged due to maximized rehab potential. .   Deneise Lever, [place], [place], [place] 05/15/2021, 5:52 PM  [place] [address] [place], [place], [address] Phone: [phone]   Fax:  [phone]   Name: DASCHEL ROUGHTON III MRN: [id number]  Date of Birth: [birth year]

## 2021-05-16 ENCOUNTER — Encounter: Payer: Medicare Other | Admitting: Speech Pathology

## 2021-05-22 ENCOUNTER — Other Ambulatory Visit: Payer: Self-pay

## 2021-05-22 ENCOUNTER — Ambulatory Visit (INDEPENDENT_AMBULATORY_CARE_PROVIDER_SITE_OTHER): Payer: Medicare Other | Admitting: Urology

## 2021-05-22 ENCOUNTER — Encounter: Payer: Self-pay | Admitting: Urology

## 2021-05-22 VITALS — BP 117/79 | HR 93 | Ht 67.0 in | Wt 188.0 lb

## 2021-05-22 DIAGNOSIS — N138 Other obstructive and reflux uropathy: Secondary | ICD-10-CM

## 2021-05-22 DIAGNOSIS — I959 Hypotension, unspecified: Secondary | ICD-10-CM | POA: Diagnosis not present

## 2021-05-22 DIAGNOSIS — N401 Enlarged prostate with lower urinary tract symptoms: Secondary | ICD-10-CM

## 2021-05-22 DIAGNOSIS — R351 Nocturia: Secondary | ICD-10-CM

## 2021-05-22 NOTE — Patient Instructions (Signed)
*  We are out of the vacuum erection device brochures. [employer] for information on this.  *Stop Terazosin and if no improvement in Blood pressure in 1-2 weeks then stop Tamsulosin.   *If you need an alternative medication for prostate call our office.

## 2021-05-22 NOTE — Progress Notes (Signed)
05/22/2021 4:01 PM   Blake [name] 1950/12/19 [id number]  Referring provider: Dorothey Baseman, MD 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 male presents to discuss medication management.  His wife was not feeling well and stayed in the car however was listening by phone and provided the majority of the history.  They recently saw Dr. Juliann Pares and he was noted to be hypotensive.  His wife states he has had decreased energy and has been gaining weight and not active She states Dr. Juliann Pares indicated he could not lower or discontinue any of his cardiac medications but asked them to follow-up here to discuss stopping some of his prostate medications. On tamsulosin 0.4 mg daily for BPH and has elected to continue terazosin 1 mg daily for painful ejaculation He is also on tadalafil 5 mg daily and testosterone  Recently noted increased nocturia at 4-5 times per night.  States he was taking duloxetine at 2:30 AM to lessen his depression in the morning.  He recently started taking it at 6 AM and has found his nocturia has decreased.  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   Coronary artery disease    Dementia (HCC)    Depression    Disorder of male genital organs 07/11/2013   Dyspnea    Encounter  for long-term (current) use of medications 11/21/2014   Erectile dysfunction 07/11/2013   Executive function deficit 03/06/2016   GERD (gastroesophageal reflux disease)    Headache    stress   Hearing loss    Hyperlipidemia    Hypertension    Hypogonadism, 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   Major depression, recurrent, chronic (HCC) 03/06/2016   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   Mixed hyperlipidemia 02/02/2014   Myocardial infarction (HCC) 08/2013   OCD (obsessive compulsive disorder)    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   Other specified disorder of male genital organs(608.89) 07/11/2013   Prostatic hypertrophy    Pseudophakia of both eyes 04/20/2015   Raynaud's disease    Reduced libido 07/11/2013   Repeated falls    weak left ankle   Scoliosis    Stroke (HCC) 2/16   "light"   Synovitis    knees, ankles    Surgical History: Past Surgical History:  Procedure Laterality Date   BACK SURGERY  2011   rods and screws   CLOSED REDUCTION NASAL FRACTURE Bilateral 06/11/2018   Procedure: CLOSED REDUCTION NASAL FRACTURE;  Surgeon: Vernie Murders, MD;  Location: [facility];  Service: ENT;  Laterality: Bilateral;   COLONOSCOPY  2015   CORONARY ANGIOPLASTY     2015 stent   CORONARY STENT INTERVENTION N/A 06/08/2019   Procedure: CORONARY STENT INTERVENTION;  Surgeon: Alwyn Pea, MD;  Location: [facility];  Service: Cardiovascular;  Laterality: N/A;   CYSTOSCOPY  1984   EYE SURGERY Bilateral 2005,2006,2009   detached retina, cataract   FOOT ARTHRODESIS Left 05/30/2015   Procedure: ARTHRODESIS FOOT STJ LT FOOT;  Surgeon: [name], DPM;  Location: [facility];  Service: Podiatry;  Laterality: Left;  WITH POPLITEAL BLOCK   FOOT SURGERY Left    HERNIA REPAIR Right 1969   inguinal   LEFT HEART CATH AND CORONARY ANGIOGRAPHY Left 06/08/2019   Procedure: LEFT HEART CATH  AND CORONARY ANGIOGRAPHY;  Surgeon: Alwyn Pea, MD;  Location: [facility];  Service: Cardiovascular;  Laterality: Left;   SEPTOPLASTY Bilateral 06/11/2018   Procedure: SEPTOPLASTY;  Surgeon: Vernie Murders, MD;  Location: [facility];  Service: ENT;  Laterality: Bilateral;   SHOULDER SURGERY Right 2004   skull surgery     TOE SURGERY Right 2009   TONSILLECTOMY  2008    Home Medications:  Allergies as of 05/22/2021       Reactions   Mirtazapine Other (See Comments)   Other reaction(s): Other (See Comments) Irritability/anger, increased appetite, worsening depression Irritability/anger, increased appetite, worsening depression        Medication List        Accurate as of May 22, 2021  4:01 PM. If you have any questions, ask your nurse or doctor.          acetaminophen 500 MG tablet Commonly known as: TYLENOL Take 1,000 mg by mouth every 6 (six) hours as needed for mild pain or moderate pain.   aspirin EC 81 MG tablet Take 81 mg by mouth daily.   B-D 3CC LUER-LOK SYR 22GX1-1/2 22G X 1-1/2" 3 ML Misc Generic drug: SYRINGE-NEEDLE (DISP) 3 ML USE AS DIRECTED WITH TESTOSTERONE   Calcium Carbonate-Vitamin D 600-200 MG-UNIT Caps Take 1 tablet by mouth 2 (two) times daily.   carvedilol 3.125 MG tablet Commonly known as: COREG Take 3.125 mg by mouth daily.   clindamycin 1 % lotion Commonly known as: CLEOCIN T Apply 1 application topically 2 (two) times daily as needed.   clopidogrel 75 MG tablet Commonly known as: PLAVIX Take 75 mg by mouth daily.   dexlansoprazole 60 MG capsule Commonly known as: DEXILANT Take 60 mg by mouth every evening.   DULoxetine 60 MG capsule Commonly known as: CYMBALTA Take 60 mg by mouth 2 (two) times daily.   Entresto 24-26 MG Generic drug: sacubitril-valsartan Take 1 tablet by mouth every 12 (twelve) hours.   FIBER PO Take 1 tablet by mouth 2 (two) times daily.   folic acid 1 MG tablet Commonly known as:  FOLVITE Take 1 mg by mouth daily.   furosemide 20 MG tablet Commonly known as: LASIX Take 20 mg by mouth daily.   lisinopril 40 MG tablet Commonly known as: ZESTRIL Take 1 tablet (40 mg total) by mouth daily.   melatonin 5 MG Tabs Take 5 mg by mouth at bedtime. 30 min before bed   methotrexate 50 MG/2ML injection Inject 0.8 mg into the skin once a week.   nitroGLYCERIN 0.4 MG SL tablet Commonly known as: NITROSTAT Place 0.4 mg under the tongue every 2 (two) hours as needed.   PROBIOTIC DAILY PO Take 1 tablet by mouth daily. Senior   SUPER B COMPLEX PO Take 1 tablet by mouth daily.   tadalafil 5 MG tablet Commonly known as: CIALIS TAKE ONE TABLET BY MOUTH DAILY   tamsulosin  0.4 MG Caps capsule Commonly known as: FLOMAX TAKE 1 CAPSULE (0.4 MG TOTAL) BY MOUTH 2 (TWO) TIMES DAILY.   terazosin 1 MG capsule Commonly known as: HYTRIN TAKE 1 CAPSULE (1 MG TOTAL) BY MOUTH AT BEDTIME.   testosterone cypionate 200 MG/ML injection Commonly known as: DEPOTESTOSTERONE CYPIONATE INJECT 0.25 MLS (50 MG TOTAL) INTO THE MUSCLE ONCE A WEEK.        Allergies:  Allergies  Allergen Reactions   Mirtazapine Other (See Comments)    Other reaction(s): Other (See Comments) Irritability/anger, increased appetite, worsening depression Irritability/anger, increased appetite, worsening depression     Family History: No family history on file.  Social History:  reports that he has never smoked. He has never used smokeless tobacco. He reports that he does not drink alcohol and does not use drugs.   Physical Exam: BP 117/79    Pulse 93    Ht 5\' 7"  (1.702 m)    Wt 188 lb (85.3 kg)    BMI 29.44 kg/m   Constitutional:  Alert and oriented, No acute distress. HEENT: [place] AT, moist mucus membranes.  Trachea midline, no masses. Cardiovascular: No clubbing, cyanosis, or edema. Respiratory: Normal respiratory effort, no increased work of breathing.   Assessment & Plan:    1.   Hypotension He was taking a low-dose of terazosin stating this decreased his ejaculatory pain and I initially recommended he discontinue If no improvement in his symptoms and hypotension in 1-2 weeks and would recommend discontinuing the tamsulosin If his symptoms improve however he notes worsening lower urinary tract symptoms will give a trial of silodosin  2.  Nocturia He was informed that untreated sleep apnea is a common cause of nocturia.  He recently started CPAP and was informed his nocturia may improve.  3.  BPH with LUTS As above   , MD  [facility] [address], [address] [place], [place] [phone]

## 2021-06-15 ENCOUNTER — Other Ambulatory Visit: Payer: Self-pay | Admitting: Urology

## 2021-06-15 DIAGNOSIS — N138 Other obstructive and reflux uropathy: Secondary | ICD-10-CM

## 2021-07-23 ENCOUNTER — Telehealth: Payer: Self-pay | Admitting: Urology

## 2021-07-23 NOTE — Telephone Encounter (Signed)
Patient called and is asking for a refill on his testosterone. Please let him know if it can be called in or does he need labs first. ? ? ?

## 2021-07-24 NOTE — Telephone Encounter (Signed)
Due for testosterone and hematocrit lab visit this month ?

## 2021-07-25 NOTE — Telephone Encounter (Signed)
Notified patient as instructed, patient pleased. Discussed follow-up appointments, patient agrees  

## 2021-07-29 ENCOUNTER — Other Ambulatory Visit: Payer: Medicare Other

## 2021-07-29 DIAGNOSIS — E291 Testicular hypofunction: Secondary | ICD-10-CM

## 2021-07-30 ENCOUNTER — Other Ambulatory Visit: Payer: Self-pay | Admitting: Urology

## 2021-07-30 ENCOUNTER — Telehealth: Payer: Self-pay | Admitting: Urology

## 2021-07-30 DIAGNOSIS — E291 Testicular hypofunction: Secondary | ICD-10-CM

## 2021-07-30 LAB — TESTOSTERONE: Testosterone: 66 ng/dL — ABNORMAL LOW (ref 264–916)

## 2021-07-30 LAB — HEMATOCRIT: Hematocrit: 44.9 % (ref 37.5–51.0)

## 2021-07-30 NOTE — Telephone Encounter (Signed)
Testosterone level was low at 66.  When was his last injection in relation to this blood work? ?

## 2021-07-31 MED ORDER — TESTOSTERONE CYPIONATE 200 MG/ML IM SOLN: 50.0000 mg | INTRAMUSCULAR | 1 refills | Status: DC

## 2021-07-31 NOTE — Addendum Note (Signed)
Addended by: Riki Altes on: 07/31/2021 12:57 PM ? ? Modules accepted: Orders ? ?

## 2021-07-31 NOTE — Telephone Encounter (Signed)
Continue present dose.  Refill was sent ?

## 2021-07-31 NOTE — Telephone Encounter (Addendum)
Left message to advised patient ?

## 2021-07-31 NOTE — Telephone Encounter (Signed)
Last injection was over two weeks ago. Medication was not refilled ?

## 2021-08-14 ENCOUNTER — Other Ambulatory Visit: Payer: Self-pay | Admitting: Family Medicine

## 2021-08-14 MED ORDER — "BD LUER-LOK SYRINGE 22G X 1-1/2"" 3 ML MISC": 20 refills | Status: DC

## 2021-09-05 ENCOUNTER — Telehealth: Payer: Self-pay

## 2021-09-05 NOTE — Telephone Encounter (Signed)
Incoming call from pt on triage line who states he has recently seen cardiology for hypotension. He was told that tamsulosin could be contributing to this and would like to know what your recommendations are. Please advise.

## 2021-09-06 NOTE — Telephone Encounter (Signed)
This could be a cause of his blood pressure abnormalities and would recommend discontinuing

## 2021-09-06 NOTE — Telephone Encounter (Signed)
Patient notified and voiced understanding.

## 2021-09-10 NOTE — Telephone Encounter (Signed)
Pt's wife, Arline Asp, [place] saying pt was confused about what was told to her and would like for her to be called.  I have changed best number in demographics.  She is also on DPR.

## 2021-09-11 ENCOUNTER — Ambulatory Visit (INDEPENDENT_AMBULATORY_CARE_PROVIDER_SITE_OTHER): Payer: Medicare Other | Admitting: Urology

## 2021-09-11 ENCOUNTER — Encounter: Payer: Self-pay | Admitting: Urology

## 2021-09-11 VITALS — BP 156/91 | HR 72 | Ht 67.0 in | Wt 191.0 lb

## 2021-09-11 DIAGNOSIS — R339 Retention of urine, unspecified: Secondary | ICD-10-CM | POA: Diagnosis not present

## 2021-09-11 DIAGNOSIS — N138 Other obstructive and reflux uropathy: Secondary | ICD-10-CM

## 2021-09-11 DIAGNOSIS — N401 Enlarged prostate with lower urinary tract symptoms: Secondary | ICD-10-CM

## 2021-09-11 DIAGNOSIS — R300 Dysuria: Secondary | ICD-10-CM

## 2021-09-11 LAB — URINALYSIS, COMPLETE
Bilirubin, UA: NEGATIVE
Glucose, UA: NEGATIVE
Ketones, UA: NEGATIVE
Leukocytes,UA: NEGATIVE
Nitrite, UA: NEGATIVE
Protein,UA: NEGATIVE
RBC, UA: NEGATIVE
Specific Gravity, UA: 1.02 (ref 1.005–1.030)
Urobilinogen, Ur: 0.2 mg/dL (ref 0.2–1.0)
pH, UA: 5.5 (ref 5.0–7.5)

## 2021-09-11 LAB — MICROSCOPIC EXAMINATION
Bacteria, UA: NONE SEEN
RBC, Urine: NONE SEEN /hpf (ref 0–2)

## 2021-09-11 LAB — BLADDER SCAN AMB NON-IMAGING: Scan Result: 181

## 2021-09-11 MED ORDER — SILODOSIN 8 MG PO CAPS: 8.0000 mg | ORAL_CAPSULE | Freq: Every day | ORAL | 0 refills | Status: DC

## 2021-09-11 NOTE — Progress Notes (Signed)
09/11/2021 2:05 PM   Blake [name] Apr 27, 1950 [id number]  Referring provider: Juluis Pitch, MD [place]. [address] 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 male called for an acute visit secondary to worsening lower urinary tract symptoms.  Recent evaluated by cardiology for hypotension and postural changes potentially exacerbated by tamsulosin and terazosin I recommended discontinuing these medications which he stopped approximately 1 week ago.  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   Chronic left shoulder pain 04/03/2016   Chronic prostatitis 07/11/2013   Cognitive deficit as late effect of traumatic brain injury ([place]) 03/06/2016   Coronary artery disease    Dementia ([place])    Depression    Disorder of male genital organs 07/11/2013   Dyspnea    Encounter for long-term (current) use of medications 11/21/2014   Erectile dysfunction 07/11/2013   Executive function deficit 03/06/2016   GERD (gastroesophageal reflux disease)    Headache    stress   Hearing loss    Hyperlipidemia    Hypertension    Hypogonadism, 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   Major depression, recurrent, chronic ([place]) 03/06/2016   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   Mixed hyperlipidemia 02/02/2014   Myocardial infarction ([place]) 08/2013   OCD (obsessive compulsive disorder)    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   Other specified disorder of male genital  organs(608.89) 07/11/2013   Prostatic hypertrophy    Pseudophakia of both eyes 04/20/2015   Raynaud's disease    Reduced libido 07/11/2013   Repeated falls    weak left ankle   Scoliosis    Stroke ([place]) 2/16   "light"   Synovitis    knees, ankles    Surgical History: Past Surgical History:  Procedure Laterality Date   BACK SURGERY  2011   rods and screws   CLOSED REDUCTION NASAL FRACTURE Bilateral 06/11/2018   Procedure: CLOSED REDUCTION NASAL FRACTURE;  Surgeon: Margaretha Sheffield, MD;  Location: [facility];  Service: ENT;  Laterality: Bilateral;   COLONOSCOPY  2015   CORONARY ANGIOPLASTY     2015 stent   CORONARY STENT INTERVENTION N/A 06/08/2019   Procedure: CORONARY STENT INTERVENTION;  Surgeon: Yolonda Kida, MD;  Location: [place] [facility];  Service: Cardiovascular;  Laterality: N/A;   CYSTOSCOPY  1984   EYE SURGERY Bilateral 2005,2006,2009   detached retina, cataract   FOOT ARTHRODESIS Left 05/30/2015   Procedure: ARTHRODESIS FOOT STJ LT FOOT;  Surgeon: Samara Deist, DPM;  Location: [place];  Service: Podiatry;  Laterality: Left;  WITH POPLITEAL BLOCK   FOOT SURGERY Left    HERNIA REPAIR Right 1969   inguinal   LEFT HEART CATH AND CORONARY ANGIOGRAPHY Left 06/08/2019   Procedure: LEFT HEART CATH AND CORONARY ANGIOGRAPHY;  Surgeon: Yolonda Kida, MD;  Location: [place] [facility];  Service: Cardiovascular;  Laterality: Left;   SEPTOPLASTY Bilateral 06/11/2018   Procedure: SEPTOPLASTY;  Surgeon: Margaretha Sheffield, MD;  Location:  [facility];  Service: ENT;  Laterality: Bilateral;   SHOULDER SURGERY Right 2004   skull surgery     TOE SURGERY Right 2009   TONSILLECTOMY  2008    Home Medications:  Allergies as of 09/11/2021       Reactions   Mirtazapine Other (See Comments)   Other reaction(s): Other (See Comments) Irritability/anger, increased appetite, worsening depression Irritability/anger, increased appetite, worsening depression        Medication  List        Accurate as of Sep 11, 2021  2:05 PM. If you have any questions, ask your nurse or doctor.          acetaminophen 500 MG tablet Commonly known as: TYLENOL Take 1,000 mg by mouth every 6 (six) hours as needed for mild pain or moderate pain.   aspirin EC 81 MG tablet Take 81 mg by mouth daily.   B-D 3CC LUER-LOK SYR 22GX1-1/2 22G X 1-1/2" 3 ML Misc Generic drug: SYRINGE-NEEDLE (DISP) 3 ML USE AS DIRECTED WITH TESTOSTERONE   Calcium Carbonate-Vitamin D 600-200 MG-UNIT Caps Take 1 tablet by mouth 2 (two) times daily.   carvedilol 3.125 MG tablet Commonly known as: COREG Take 3.125 mg by mouth daily.   clindamycin 1 % lotion Commonly known as: CLEOCIN T Apply 1 application topically 2 (two) times daily as needed.   clopidogrel 75 MG tablet Commonly known as: PLAVIX Take 75 mg by mouth daily.   dexlansoprazole 60 MG capsule Commonly known as: DEXILANT Take 60 mg by mouth every evening.   DULoxetine 60 MG capsule Commonly known as: CYMBALTA Take 60 mg by mouth 2 (two) times daily.   Entresto 24-26 MG Generic drug: sacubitril-valsartan Take 1 tablet by mouth every 12 (twelve) hours.   FIBER PO Take 1 tablet by mouth 2 (two) times daily.   folic acid 1 MG tablet Commonly known as: FOLVITE Take 1 mg by mouth daily.   furosemide 20 MG tablet Commonly known as: LASIX Take 20 mg by mouth daily.   lisinopril 40 MG tablet Commonly known as: ZESTRIL Take 1 tablet (40 mg total) by mouth daily.   melatonin 5 MG Tabs Take 5 mg by mouth at bedtime. 30 min before bed   methotrexate 50 MG/2ML injection Inject 0.8 mg into the skin once a week.   nitroGLYCERIN 0.4 MG SL tablet Commonly known as: NITROSTAT Place 0.4 mg under the tongue every 2 (two) hours as needed.   PROBIOTIC DAILY PO Take 1 tablet by mouth daily. Senior   silodosin 8 MG Caps capsule Commonly known as: RAPAFLO Take 1 capsule (8 mg total) by mouth daily with breakfast. Started by:  Abbie Sons, MD   SUPER B COMPLEX PO Take 1 tablet by mouth daily.   tadalafil 5 MG tablet Commonly known as: CIALIS TAKE ONE TABLET BY MOUTH DAILY   tamsulosin 0.4 MG Caps capsule Commonly known as: FLOMAX TAKE 1 CAPSULE BY MOUTH 2 TIMES DAILY.   terazosin 1 MG capsule Commonly known as: HYTRIN TAKE 1 CAPSULE (1 MG TOTAL) BY MOUTH AT BEDTIME.   testosterone cypionate 200 MG/ML injection Commonly known as: DEPOTESTOSTERONE CYPIONATE Inject 0.25 mLs (50 mg total) into the muscle once a week.        Allergies:  Allergies  Allergen Reactions   Mirtazapine Other (See Comments)    Other reaction(s): Other (See Comments) Irritability/anger, increased appetite, worsening depression Irritability/anger, increased appetite, worsening depression     Family History: No family history on  file.  Social History:  reports that he has never smoked. He has never used smokeless tobacco. He reports that he does not drink alcohol and does not use drugs.   Physical Exam: BP (!) 156/91   Pulse 72   Ht 5\' 7"  (1.702 m)   Wt 191 lb (86.6 kg)   BMI 29.91 kg/m   Constitutional:  Alert and oriented, No acute distress. HEENT: [place] AT, moist mucus membranes.  Trachea midline, no masses. Cardiovascular: No clubbing, cyanosis, or edema. Respiratory: Normal respiratory effort, no increased work of breathing.   Assessment & Plan:    1.  Hypotension He was taking a low-dose of terazosin stating this decreased his ejaculatory pain and I initially recommended he discontinue If no improvement in his symptoms and hypotension in 1-2 weeks and would recommend discontinuing the tamsulosin If his symptoms improve however he notes worsening lower urinary tract symptoms will give a trial of silodosin  2.  Nocturia He was informed that untreated sleep apnea is a common cause of nocturia.  He recently started CPAP and was informed his nocturia may improve.  3.  BPH with LUTS As above   Abbie Sons, MD  [facility] [address] [place], [place] [address] [phone]

## 2021-09-12 ENCOUNTER — Encounter: Payer: Self-pay | Admitting: Urology

## 2021-10-07 ENCOUNTER — Telehealth: Payer: Self-pay

## 2021-10-07 ENCOUNTER — Other Ambulatory Visit: Payer: Self-pay | Admitting: Urology

## 2021-10-07 NOTE — Telephone Encounter (Signed)
Since this is a controlled substance will be unable to refill

## 2021-10-07 NOTE — Telephone Encounter (Signed)
Incoming call from pt who states that he is not due for Testosterone refill until 10/26/21, however he has lost his current Testosterone RX. He is requesting a refill be sent to [facility] on [address]. Pt advised that his is a controlled substance and refill may not be possible.

## 2021-10-08 NOTE — Telephone Encounter (Signed)
[name] notified and voiced understanding.

## 2021-10-09 ENCOUNTER — Telehealth: Payer: Self-pay

## 2021-10-09 MED ORDER — SILODOSIN 8 MG PO CAPS
8.0000 mg | ORAL_CAPSULE | Freq: Every day | ORAL | 3 refills | Status: DC
Start: 2021-10-09 — End: 2021-12-26

## 2021-10-09 NOTE — Telephone Encounter (Signed)
Pt wife called stating [employer] is helping patient and woulfdlike a refill sent to [facility]

## 2021-10-10 ENCOUNTER — Ambulatory Visit (INDEPENDENT_AMBULATORY_CARE_PROVIDER_SITE_OTHER): Payer: Medicare Other | Admitting: Physician Assistant

## 2021-10-10 ENCOUNTER — Encounter: Payer: Self-pay | Admitting: Physician Assistant

## 2021-10-10 ENCOUNTER — Ambulatory Visit: Payer: Medicare Other | Admitting: Physician Assistant

## 2021-10-10 VITALS — BP 144/84 | HR 71 | Ht 67.0 in | Wt 199.0 lb

## 2021-10-10 DIAGNOSIS — N138 Other obstructive and reflux uropathy: Secondary | ICD-10-CM

## 2021-10-10 DIAGNOSIS — N401 Enlarged prostate with lower urinary tract symptoms: Secondary | ICD-10-CM

## 2021-10-10 DIAGNOSIS — E291 Testicular hypofunction: Secondary | ICD-10-CM

## 2021-11-25 ENCOUNTER — Other Ambulatory Visit: Payer: Self-pay

## 2021-11-25 DIAGNOSIS — N138 Other obstructive and reflux uropathy: Secondary | ICD-10-CM

## 2021-12-06 ENCOUNTER — Other Ambulatory Visit: Payer: Medicare Other

## 2021-12-09 ENCOUNTER — Ambulatory Visit: Payer: Medicare Other | Admitting: Urology

## 2021-12-11 ENCOUNTER — Other Ambulatory Visit: Payer: Self-pay

## 2021-12-11 ENCOUNTER — Other Ambulatory Visit: Payer: Medicare Other

## 2021-12-11 DIAGNOSIS — E291 Testicular hypofunction: Secondary | ICD-10-CM

## 2021-12-11 DIAGNOSIS — N138 Other obstructive and reflux uropathy: Secondary | ICD-10-CM

## 2021-12-12 LAB — PSA: Prostate Specific Ag, Serum: 3.1 ng/mL (ref 0.0–4.0)

## 2021-12-12 LAB — HEMOGLOBIN AND HEMATOCRIT, BLOOD
Hematocrit: 48 % (ref 37.5–51.0)
Hemoglobin: 15.8 g/dL (ref 13.0–17.7)

## 2021-12-12 LAB — TESTOSTERONE: Testosterone: 400 ng/dL (ref 264–916)

## 2021-12-18 ENCOUNTER — Encounter: Payer: Self-pay | Admitting: Urology

## 2021-12-18 ENCOUNTER — Ambulatory Visit (INDEPENDENT_AMBULATORY_CARE_PROVIDER_SITE_OTHER): Payer: Medicare Other | Admitting: Urology

## 2021-12-18 VITALS — BP 140/86 | HR 71 | Ht 70.0 in | Wt 199.0 lb

## 2021-12-18 DIAGNOSIS — N138 Other obstructive and reflux uropathy: Secondary | ICD-10-CM

## 2021-12-18 DIAGNOSIS — E291 Testicular hypofunction: Secondary | ICD-10-CM

## 2021-12-18 DIAGNOSIS — N401 Enlarged prostate with lower urinary tract symptoms: Secondary | ICD-10-CM

## 2021-12-18 DIAGNOSIS — R351 Nocturia: Secondary | ICD-10-CM | POA: Diagnosis not present

## 2021-12-19 ENCOUNTER — Encounter: Payer: Self-pay | Admitting: Urology

## 2021-12-19 MED ORDER — TESTOSTERONE CYPIONATE 200 MG/ML IM SOLN: 60.0000 mg | INTRAMUSCULAR | 0 refills | Status: DC

## 2021-12-19 NOTE — Progress Notes (Signed)
12/18/2021 7:45 AM   Blake [name] [birth year] [id number]  Referring provider: Juluis Pitch, MD [phone] [place],  [place] [address]  Chief Complaint  Patient presents with   Benign Prostatic Hypertrophy    Urologic problem list: -BPH with lower urinary tract symptoms -Chronic prostatitis/chronic pelvic pain syndrome (painful ejaculation) -Hypogonadism -Erectile dysfunction   HPI: 71 y.o. male presents for follow-up.  Was taken off tamsulosin and low-dose doxazosin February 2023 secondary to hypotension Started on silodosin which has been effective. Recently he was not having bothersome daytime symptoms but getting up 9-10 times per night to void He started taking the medication in the early evening which has decreased his nocturia back to baseline at 3-4 He has been diagnosed with sleep apnea but unable to tolerate CPAP.  His wife states he has pulmonary follow-up in the near future Remains on TRT.  Labs 12/11/2021: Testosterone 400 ng/dL, PSA 3.1, H/H normal.  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   Coronary artery disease    Dementia ([place])    Depression    Disorder of male genital organs 07/11/2013   Dyspnea    Encounter for long-term (current) use of medications 11/21/2014   Erectile dysfunction 07/11/2013   Executive function deficit 03/06/2016   GERD (gastroesophageal reflux disease)    Headache    stress   Hearing loss    Hyperlipidemia    Hypertension    Hypogonadism, male  07/11/2013   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   Major depression, recurrent, chronic ([place]) 03/06/2016   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   Mixed hyperlipidemia 02/02/2014   Myocardial infarction ([place]) 08/2013   OCD (obsessive compulsive disorder)    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   Other specified disorder of male genital organs(608.89) 07/11/2013   Prostatic hypertrophy    Pseudophakia of both eyes 04/20/2015   Raynaud's disease    Reduced libido 07/11/2013   Repeated falls    weak left ankle   Scoliosis    Stroke ([place]) 2/16   "light"   Synovitis    knees, ankles    Surgical History: Past Surgical History:  Procedure Laterality Date   BACK SURGERY  2011   rods and screws   CLOSED REDUCTION NASAL FRACTURE Bilateral 06/11/2018   Procedure: CLOSED REDUCTION NASAL FRACTURE;  Surgeon: Margaretha Sheffield, MD;  Location: [facility];  Service: ENT;  Laterality: Bilateral;   COLONOSCOPY  2015   CORONARY ANGIOPLASTY     2015 stent   CORONARY STENT INTERVENTION N/A 06/08/2019   Procedure: CORONARY STENT INTERVENTION;  Surgeon: Yolonda Kida, MD;  Location: [facility];  Service: Cardiovascular;  Laterality: N/A;   CYSTOSCOPY  1984   EYE SURGERY Bilateral 2005,2006,2009   detached retina, cataract   FOOT ARTHRODESIS Left 05/30/2015   Procedure: ARTHRODESIS FOOT STJ LT FOOT;  Surgeon: Samara Deist, DPM;  Location: [facility];  Service:  Podiatry;  Laterality: Left;  WITH POPLITEAL BLOCK   FOOT SURGERY Left    HERNIA REPAIR Right 1969   inguinal   LEFT HEART CATH AND CORONARY ANGIOGRAPHY Left 06/08/2019   Procedure: LEFT HEART CATH AND CORONARY ANGIOGRAPHY;  Surgeon: Alwyn Pea, MD;  Location: [facility];  Service: Cardiovascular;  Laterality: Left;   SEPTOPLASTY Bilateral 06/11/2018   Procedure: SEPTOPLASTY;  Surgeon: Vernie Murders, MD;  Location: [facility];  Service:  ENT;  Laterality: Bilateral;   SHOULDER SURGERY Right 2004   skull surgery     TOE SURGERY Right 2009   TONSILLECTOMY  2008    Home Medications:  Allergies as of 12/18/2021       Reactions   Mirtazapine Other (See Comments)   Other reaction(s): Other (See Comments) Irritability/anger, increased appetite, worsening depression Irritability/anger, increased appetite, worsening depression        Medication List        Accurate as of December 18, 2021 11:59 PM. If you have any questions, ask your nurse or doctor.          acetaminophen 500 MG tablet Commonly known as: TYLENOL Take 1,000 mg by mouth every 6 (six) hours as needed for mild pain or moderate pain.   aspirin EC 81 MG tablet Take 81 mg by mouth daily.   B-D 3CC LUER-LOK SYR 22GX1-1/2 22G X 1-1/2" 3 ML Misc Generic drug: SYRINGE-NEEDLE (DISP) 3 ML USE AS DIRECTED WITH TESTOSTERONE   Calcium Carbonate-Vitamin D 600-200 MG-UNIT Caps Take 1 tablet by mouth 2 (two) times daily.   carvedilol 3.125 MG tablet Commonly known as: COREG Take 3.125 mg by mouth daily.   clindamycin 1 % lotion Commonly known as: CLEOCIN T Apply 1 application topically 2 (two) times daily as needed.   clopidogrel 75 MG tablet Commonly known as: PLAVIX Take 75 mg by mouth daily.   dexlansoprazole 60 MG capsule Commonly known as: DEXILANT Take 60 mg by mouth every evening.   DULoxetine 60 MG capsule Commonly known as: CYMBALTA Take 60 mg by mouth 2 (two) times daily.   Entresto 24-26 MG Generic drug: sacubitril-valsartan Take 1 tablet by mouth every 12 (twelve) hours.   FIBER PO Take 1 tablet by mouth 2 (two) times daily.   folic acid 1 MG tablet Commonly known as: FOLVITE Take 1 mg by mouth daily.   furosemide 20 MG tablet Commonly known as: LASIX Take 20 mg by mouth daily.   lisinopril 40 MG tablet Commonly known as: ZESTRIL Take 1 tablet (40 mg total) by mouth daily.   melatonin 5 MG Tabs Take 5 mg by mouth at  bedtime. 30 min before bed   methotrexate 50 MG/2ML injection Inject 0.8 mg into the skin once a week.   nitroGLYCERIN 0.4 MG SL tablet Commonly known as: NITROSTAT Place 0.4 mg under the tongue every 2 (two) hours as needed.   omeprazole 20 MG capsule Commonly known as: PRILOSEC Take 20 mg by mouth daily.   PROBIOTIC DAILY PO Take 1 tablet by mouth daily. Senior   silodosin 8 MG Caps capsule Commonly known as: RAPAFLO Take 1 capsule (8 mg total) by mouth daily with breakfast.   SUPER B COMPLEX PO Take 1 tablet by mouth daily.   tadalafil 5 MG tablet Commonly known as: CIALIS TAKE ONE TABLET BY MOUTH DAILY   testosterone cypionate 200 MG/ML injection Commonly known as: DEPOTESTOSTERONE CYPIONATE Inject 0.25 mLs (50 mg total) into the muscle once a week.  Allergies:  Allergies  Allergen Reactions   Mirtazapine Other (See Comments)    Other reaction(s): Other (See Comments) Irritability/anger, increased appetite, worsening depression Irritability/anger, increased appetite, worsening depression     Family History: History reviewed. No pertinent family history.  Social History:  reports that he has never smoked. He has never used smokeless tobacco. He reports that he does not drink alcohol and does not use drugs.   Physical Exam: BP (!) 140/86   Pulse 71   Ht 5\' 10"  (1.778 m)   Wt 199 lb (90.3 kg)   BMI 28.55 kg/m   Constitutional:  Alert and oriented, No acute distress. HEENT: [place] AT, moist mucus membranes.  Trachea midline, no masses. Cardiovascular: No clubbing, cyanosis, or edema. Respiratory: Normal respiratory effort, no increased work of breathing.   Assessment & Plan:    1.  BPH with LUTS Stable on silodosin  2.  Nocturia Back to baseline with taking silodosin later in the day Pulmonary follow-up scheduled for his sleep apnea  3.  Hypogonadism Will increase testosterone to 0.3 cc weekly Follow-up testosterone level 6 weeks with dose  change   , MD  [facility] [address] [place], [place] [phone]

## 2021-12-23 ENCOUNTER — Telehealth: Payer: Self-pay | Admitting: Urology

## 2021-12-26 ENCOUNTER — Other Ambulatory Visit: Payer: Self-pay | Admitting: Urology

## 2022-01-06 ENCOUNTER — Other Ambulatory Visit: Payer: Self-pay | Admitting: *Deleted

## 2022-01-06 DIAGNOSIS — E291 Testicular hypofunction: Secondary | ICD-10-CM

## 2022-01-06 NOTE — Telephone Encounter (Signed)
We had discussed changing the dose to 0.3 cc (60 mg) weekly at the last office visit.  This was his previous dose.  Do they desire to increase to 0.3 cc?

## 2022-01-06 NOTE — Telephone Encounter (Signed)
Based on most recent visit note, patient was to increase dosage for the testosterone and run out of medication.    "Inject 0.25 mLs (50 mg total) into the muscle once a week"  Please send a new prescription with new dosage to the [facility] on [address].  Please call patient if there are any questions or concerns.

## 2022-01-06 NOTE — Telephone Encounter (Addendum)
Patient wife states she would like to use 0.30 if testosterone weekly. Sent to [facility]

## 2022-01-10 NOTE — Telephone Encounter (Signed)
Pt's wife calling asking if the testosterone can be called in today, wife states pt is out and they are having the hardest time filling this prescription. Please advise

## 2022-01-11 NOTE — Telephone Encounter (Signed)
Med was refilled ([facility]ml[facility] to [facility] on 8/[facility]/20023. Please call [facility] and see how much he was dispensed.

## 2022-01-14 ENCOUNTER — Emergency Department: Payer: Medicare Other

## 2022-01-14 ENCOUNTER — Encounter: Payer: Self-pay | Admitting: Emergency Medicine

## 2022-01-14 DIAGNOSIS — I5022 Chronic systolic (congestive) heart failure: Secondary | ICD-10-CM | POA: Diagnosis present

## 2022-01-14 DIAGNOSIS — F845 Asperger's syndrome: Secondary | ICD-10-CM | POA: Diagnosis present

## 2022-01-14 DIAGNOSIS — J9601 Acute respiratory failure with hypoxia: Secondary | ICD-10-CM | POA: Diagnosis present

## 2022-01-14 DIAGNOSIS — E872 Acidosis, unspecified: Secondary | ICD-10-CM | POA: Diagnosis present

## 2022-01-14 DIAGNOSIS — I110 Hypertensive heart disease with heart failure: Secondary | ICD-10-CM | POA: Diagnosis present

## 2022-01-14 DIAGNOSIS — F429 Obsessive-compulsive disorder, unspecified: Secondary | ICD-10-CM | POA: Diagnosis present

## 2022-01-14 DIAGNOSIS — I7300 Raynaud's syndrome without gangrene: Secondary | ICD-10-CM | POA: Diagnosis present

## 2022-01-14 DIAGNOSIS — F0390 Unspecified dementia without behavioral disturbance: Secondary | ICD-10-CM | POA: Diagnosis present

## 2022-01-14 DIAGNOSIS — I252 Old myocardial infarction: Secondary | ICD-10-CM

## 2022-01-14 DIAGNOSIS — Z7902 Long term (current) use of antithrombotics/antiplatelets: Secondary | ICD-10-CM

## 2022-01-14 DIAGNOSIS — U071 COVID-19: Principal | ICD-10-CM | POA: Diagnosis present

## 2022-01-14 DIAGNOSIS — Z79899 Other long term (current) drug therapy: Secondary | ICD-10-CM

## 2022-01-14 DIAGNOSIS — Z955 Presence of coronary angioplasty implant and graft: Secondary | ICD-10-CM

## 2022-01-14 DIAGNOSIS — M419 Scoliosis, unspecified: Secondary | ICD-10-CM | POA: Diagnosis present

## 2022-01-14 DIAGNOSIS — E782 Mixed hyperlipidemia: Secondary | ICD-10-CM | POA: Diagnosis present

## 2022-01-14 DIAGNOSIS — R000 Tachycardia, unspecified: Secondary | ICD-10-CM | POA: Diagnosis present

## 2022-01-14 DIAGNOSIS — E876 Hypokalemia: Secondary | ICD-10-CM | POA: Diagnosis present

## 2022-01-14 DIAGNOSIS — Z7982 Long term (current) use of aspirin: Secondary | ICD-10-CM

## 2022-01-14 DIAGNOSIS — I2510 Atherosclerotic heart disease of native coronary artery without angina pectoris: Secondary | ICD-10-CM | POA: Diagnosis present

## 2022-01-14 DIAGNOSIS — Z8673 Personal history of transient ischemic attack (TIA), and cerebral infarction without residual deficits: Secondary | ICD-10-CM

## 2022-01-14 DIAGNOSIS — E860 Dehydration: Secondary | ICD-10-CM | POA: Diagnosis present

## 2022-01-14 DIAGNOSIS — N401 Enlarged prostate with lower urinary tract symptoms: Secondary | ICD-10-CM | POA: Diagnosis present

## 2022-01-14 DIAGNOSIS — K219 Gastro-esophageal reflux disease without esophagitis: Secondary | ICD-10-CM | POA: Diagnosis present

## 2022-01-14 DIAGNOSIS — S069XAS Unspecified intracranial injury with loss of consciousness status unknown, sequela: Secondary | ICD-10-CM

## 2022-01-14 DIAGNOSIS — E871 Hypo-osmolality and hyponatremia: Secondary | ICD-10-CM | POA: Diagnosis present

## 2022-01-14 DIAGNOSIS — H9190 Unspecified hearing loss, unspecified ear: Secondary | ICD-10-CM | POA: Diagnosis present

## 2022-01-14 DIAGNOSIS — Z79890 Hormone replacement therapy (postmenopausal): Secondary | ICD-10-CM

## 2022-01-14 DIAGNOSIS — Z888 Allergy status to other drugs, medicaments and biological substances status: Secondary | ICD-10-CM

## 2022-01-14 LAB — CBC
HCT: 46.4 % (ref 39.0–52.0)
Hemoglobin: 16 g/dL (ref 13.0–17.0)
MCH: 30.9 pg (ref 26.0–34.0)
MCHC: 34.5 g/dL (ref 30.0–36.0)
MCV: 89.7 fL (ref 80.0–100.0)
Platelets: 188 10*3/uL (ref 150–400)
RBC: 5.17 MIL/uL (ref 4.22–5.81)
RDW: 12.7 % (ref 11.5–15.5)
WBC: 9.3 10*3/uL (ref 4.0–10.5)
nRBC: 0 % (ref 0.0–0.2)

## 2022-01-14 LAB — BASIC METABOLIC PANEL
Anion gap: 12 (ref 5–15)
BUN: 10 mg/dL (ref 8–23)
CO2: 18 mmol/L — ABNORMAL LOW (ref 22–32)
Calcium: 8.8 mg/dL — ABNORMAL LOW (ref 8.9–10.3)
Chloride: 104 mmol/L (ref 98–111)
Creatinine, Ser: 0.97 mg/dL (ref 0.61–1.24)
GFR, Estimated: >60 mL/min (ref >60)
Glucose, Bld: 111 mg/dL — ABNORMAL HIGH (ref 70–99)
Potassium: 3.4 mmol/L — ABNORMAL LOW (ref 3.5–5.1)
Sodium: 134 mmol/L — ABNORMAL LOW (ref 135–145)

## 2022-01-14 LAB — TROPONIN I (HIGH SENSITIVITY): Troponin I (High Sensitivity): 59 ng/L — ABNORMAL HIGH (ref <18)

## 2022-01-14 NOTE — ED Provider Triage Note (Signed)
Emergency Medicine Provider Triage Evaluation Note  Blake [name], a 71 y.o. male  was evaluated in triage.  Pt complains of chest pain with associated shortness of breath.  Patient had a positive COVID test with his PCP today, and was advised to report to the ED.  Patient on Tylenol prior to arrival, presents to the ED with room air O2 sats range between 88 and 91%.  Patient had movement of his O2 sats after being placed on 2 L by [facility], up to 96%.  Review of Systems  Positive: Chest pain, shortness of breath, fevers Negative: Nausea vomiting  Physical Exam  BP (!) 142/91 (BP Location: Right Arm)   Pulse (!) 111   Temp 99.8 F (37.7 C) (Oral)   Resp (!) 26   Ht 5\' 10"  (1.778 m)   Wt 90.2 kg   SpO2 91%   BMI 28.53 kg/m  Gen:   Awake, no distress NAD Resp:  Normal effort CTA MSK:   Moves extremities without difficulty  CVA:  RRR  Medical Decision Making  Medically screening exam initiated at 9:36 PM.  Appropriate orders placed.  Blake [name] was informed that the remainder of the evaluation will be completed by another provider, this initial triage assessment does not replace that evaluation, and the importance of remaining in the ED until their evaluation is complete.  Geriatric patient to the ED after positive COVID test in the office today.  Patient presents afebrile but tachycardic and tachypneic.  He also has a new O2 requirement.   Melvenia Needles, PA-C 01/14/22 [id number]

## 2022-01-14 NOTE — ED Triage Notes (Signed)
Pt presents via POV with complaints of CP with associated SOB. Pt tested positive for COVID at his PCP who advised that the patient come to the ED. Pt has taken Tylenol PTA around 1430 and . Pts O2 sat ranges between 88-91% on RA. Pt  placed on 2L [place] sat improved 96%.

## 2022-01-15 ENCOUNTER — Encounter: Payer: Self-pay | Admitting: Family Medicine

## 2022-01-15 ENCOUNTER — Inpatient Hospital Stay
Admission: EM | Admit: 2022-01-15 | Discharge: 2022-01-16 | DRG: 177 | Disposition: A | Discharge status: Home Health | Payer: Medicare Other | Attending: Internal Medicine | Admitting: Internal Medicine

## 2022-01-15 ENCOUNTER — Other Ambulatory Visit: Payer: Self-pay

## 2022-01-15 DIAGNOSIS — E871 Hypo-osmolality and hyponatremia: Secondary | ICD-10-CM | POA: Diagnosis present

## 2022-01-15 DIAGNOSIS — E872 Acidosis, unspecified: Secondary | ICD-10-CM | POA: Diagnosis present

## 2022-01-15 DIAGNOSIS — F0390 Unspecified dementia without behavioral disturbance: Secondary | ICD-10-CM | POA: Diagnosis present

## 2022-01-15 DIAGNOSIS — Z7982 Long term (current) use of aspirin: Secondary | ICD-10-CM | POA: Diagnosis not present

## 2022-01-15 DIAGNOSIS — E876 Hypokalemia: Secondary | ICD-10-CM

## 2022-01-15 DIAGNOSIS — I110 Hypertensive heart disease with heart failure: Secondary | ICD-10-CM | POA: Diagnosis present

## 2022-01-15 DIAGNOSIS — I2510 Atherosclerotic heart disease of native coronary artery without angina pectoris: Secondary | ICD-10-CM

## 2022-01-15 DIAGNOSIS — H9190 Unspecified hearing loss, unspecified ear: Secondary | ICD-10-CM | POA: Diagnosis present

## 2022-01-15 DIAGNOSIS — U071 COVID-19: Secondary | ICD-10-CM

## 2022-01-15 DIAGNOSIS — Z955 Presence of coronary angioplasty implant and graft: Secondary | ICD-10-CM | POA: Diagnosis not present

## 2022-01-15 DIAGNOSIS — E860 Dehydration: Secondary | ICD-10-CM | POA: Diagnosis present

## 2022-01-15 DIAGNOSIS — E782 Mixed hyperlipidemia: Secondary | ICD-10-CM | POA: Diagnosis present

## 2022-01-15 DIAGNOSIS — F845 Asperger's syndrome: Secondary | ICD-10-CM | POA: Diagnosis present

## 2022-01-15 DIAGNOSIS — Z8673 Personal history of transient ischemic attack (TIA), and cerebral infarction without residual deficits: Secondary | ICD-10-CM | POA: Diagnosis not present

## 2022-01-15 DIAGNOSIS — I252 Old myocardial infarction: Secondary | ICD-10-CM | POA: Diagnosis not present

## 2022-01-15 DIAGNOSIS — J9601 Acute respiratory failure with hypoxia: Secondary | ICD-10-CM

## 2022-01-15 DIAGNOSIS — I10 Essential (primary) hypertension: Secondary | ICD-10-CM | POA: Diagnosis not present

## 2022-01-15 DIAGNOSIS — M419 Scoliosis, unspecified: Secondary | ICD-10-CM | POA: Diagnosis present

## 2022-01-15 DIAGNOSIS — K219 Gastro-esophageal reflux disease without esophagitis: Secondary | ICD-10-CM | POA: Diagnosis present

## 2022-01-15 DIAGNOSIS — I7300 Raynaud's syndrome without gangrene: Secondary | ICD-10-CM | POA: Diagnosis present

## 2022-01-15 DIAGNOSIS — N401 Enlarged prostate with lower urinary tract symptoms: Secondary | ICD-10-CM | POA: Diagnosis present

## 2022-01-15 DIAGNOSIS — I5022 Chronic systolic (congestive) heart failure: Secondary | ICD-10-CM

## 2022-01-15 DIAGNOSIS — Z888 Allergy status to other drugs, medicaments and biological substances status: Secondary | ICD-10-CM | POA: Diagnosis not present

## 2022-01-15 DIAGNOSIS — S069XAS Unspecified intracranial injury with loss of consciousness status unknown, sequela: Secondary | ICD-10-CM | POA: Diagnosis not present

## 2022-01-15 DIAGNOSIS — R0902 Hypoxemia: Principal | ICD-10-CM

## 2022-01-15 DIAGNOSIS — F429 Obsessive-compulsive disorder, unspecified: Secondary | ICD-10-CM | POA: Diagnosis present

## 2022-01-15 LAB — BRAIN NATRIURETIC PEPTIDE: B Natriuretic Peptide: 503.7 pg/mL — ABNORMAL HIGH (ref 0.0–100.0)

## 2022-01-15 LAB — MAGNESIUM: Magnesium: 2.2 mg/dL (ref 1.7–2.4)

## 2022-01-15 LAB — RESP PANEL BY RT-PCR (FLU A&B, COVID) ARPGX2
Influenza A by PCR: NEGATIVE
Influenza B by PCR: NEGATIVE
SARS Coronavirus 2 by RT PCR: POSITIVE — AB

## 2022-01-15 LAB — D-DIMER, QUANTITATIVE: D-Dimer, Quant: 1.12 ug/mL-FEU — ABNORMAL HIGH (ref 0.00–0.50)

## 2022-01-15 LAB — FERRITIN: Ferritin: 101 ng/mL (ref 24–336)

## 2022-01-15 LAB — C-REACTIVE PROTEIN: CRP: 16.1 mg/dL — ABNORMAL HIGH (ref <1.0)

## 2022-01-15 LAB — LACTATE DEHYDROGENASE: LDH: 268 U/L — ABNORMAL HIGH (ref 98–192)

## 2022-01-15 LAB — PROCALCITONIN: Procalcitonin: 0.88 ng/mL

## 2022-01-15 LAB — TROPONIN I (HIGH SENSITIVITY): Troponin I (High Sensitivity): 58 ng/L — ABNORMAL HIGH (ref <18)

## 2022-01-15 MED ORDER — OYSTER SHELL CALCIUM/D3 500-5 MG-MCG PO TABS
1.0000 | ORAL_TABLET | Freq: Two times a day (BID) | ORAL | Status: DC
  Administered 2022-01-15 – 2022-01-16 (×3): 1 via ORAL
  Filled 2022-01-15 (×3): qty 1

## 2022-01-15 MED ORDER — ACETAMINOPHEN 325 MG PO TABS: 650.0000 mg | ORAL_TABLET | Freq: Four times a day (QID) | ORAL | Status: DC | PRN

## 2022-01-15 MED ORDER — SACUBITRIL-VALSARTAN 24-26 MG PO TABS
1.0000 | ORAL_TABLET | Freq: Two times a day (BID) | ORAL | Status: DC
  Administered 2022-01-15 – 2022-01-16 (×3): 1 via ORAL
  Filled 2022-01-15 (×4): qty 1

## 2022-01-15 MED ORDER — PANTOPRAZOLE SODIUM 40 MG PO TBEC: 40.0000 mg | DELAYED_RELEASE_TABLET | Freq: Every day | ORAL | Status: DC

## 2022-01-15 MED ORDER — DULOXETINE HCL 30 MG PO CPEP
60.0000 mg | ORAL_CAPSULE | Freq: Two times a day (BID) | ORAL | Status: DC
  Administered 2022-01-15 – 2022-01-16 (×3): 60 mg via ORAL
  Filled 2022-01-15 (×2): qty 2
  Filled 2022-01-15: qty 1

## 2022-01-15 MED ORDER — METHYLPREDNISOLONE SODIUM SUCC 125 MG IJ SOLR
1.0000 mg/kg | Freq: Two times a day (BID) | INTRAMUSCULAR | Status: DC
  Administered 2022-01-15 – 2022-01-16 (×3): 90 mg via INTRAVENOUS
  Filled 2022-01-15 (×3): qty 2

## 2022-01-15 MED ORDER — ONDANSETRON HCL 4 MG PO TABS: 4.0000 mg | ORAL_TABLET | Freq: Four times a day (QID) | ORAL | Status: DC | PRN

## 2022-01-15 MED ORDER — MAGNESIUM HYDROXIDE 400 MG/5ML PO SUSP: 30.0000 mL | Freq: Every day | ORAL | Status: DC | PRN

## 2022-01-15 MED ORDER — MELATONIN 5 MG PO TABS
5.0000 mg | ORAL_TABLET | Freq: Every day | ORAL | Status: DC
  Administered 2022-01-15: 5 mg via ORAL
  Filled 2022-01-15: qty 1

## 2022-01-15 MED ORDER — FUROSEMIDE 20 MG PO TABS
20.0000 mg | ORAL_TABLET | Freq: Every day | ORAL | Status: DC
  Administered 2022-01-16: 20 mg via ORAL
  Filled 2022-01-15: qty 1

## 2022-01-15 MED ORDER — CARVEDILOL 6.25 MG PO TABS
3.1250 mg | ORAL_TABLET | Freq: Every day | ORAL | Status: DC
  Administered 2022-01-15 – 2022-01-16 (×2): 3.125 mg via ORAL
  Filled 2022-01-15 (×2): qty 1

## 2022-01-15 MED ORDER — ACETAMINOPHEN 325 MG PO TABS
650.0000 mg | ORAL_TABLET | Freq: Once | ORAL | Status: AC
  Administered 2022-01-15: 650 mg via ORAL
  Filled 2022-01-15: qty 2

## 2022-01-15 MED ORDER — ASPIRIN 81 MG PO TBEC
81.0000 mg | DELAYED_RELEASE_TABLET | Freq: Every day | ORAL | Status: DC
  Administered 2022-01-15 – 2022-01-16 (×2): 81 mg via ORAL
  Filled 2022-01-15 (×2): qty 1

## 2022-01-15 MED ORDER — HYDROCOD POLI-CHLORPHE POLI ER 10-8 MG/5ML PO SUER: 5.0000 mL | Freq: Two times a day (BID) | ORAL | Status: DC | PRN

## 2022-01-15 MED ORDER — PREDNISONE 20 MG PO TABS: 50.0000 mg | ORAL_TABLET | Freq: Every day | ORAL | Status: DC

## 2022-01-15 MED ORDER — STERILE WATER FOR INJECTION IJ SOLN
INTRAMUSCULAR | Status: AC
  Filled 2022-01-15: qty 10

## 2022-01-15 MED ORDER — ONDANSETRON HCL 4 MG/2ML IJ SOLN: 4.0000 mg | Freq: Four times a day (QID) | INTRAMUSCULAR | Status: DC | PRN

## 2022-01-15 MED ORDER — SODIUM CHLORIDE 0.9 % IV BOLUS
1000.0000 mL | Freq: Once | INTRAVENOUS | Status: AC
  Administered 2022-01-15: 1000 mL via INTRAVENOUS

## 2022-01-15 MED ORDER — VITAMIN D 25 MCG (1000 UNIT) PO TABS
1000.0000 [IU] | ORAL_TABLET | Freq: Every day | ORAL | Status: DC
  Administered 2022-01-15 – 2022-01-16 (×2): 1000 [IU] via ORAL
  Filled 2022-01-15 (×2): qty 1

## 2022-01-15 MED ORDER — ENOXAPARIN SODIUM 40 MG/0.4ML IJ SOSY
40.0000 mg | PREFILLED_SYRINGE | INTRAMUSCULAR | Status: DC
  Administered 2022-01-15 – 2022-01-16 (×2): 40 mg via SUBCUTANEOUS
  Filled 2022-01-15 (×2): qty 0.4

## 2022-01-15 MED ORDER — RISAQUAD PO CAPS
1.0000 | ORAL_CAPSULE | Freq: Every day | ORAL | Status: DC
  Administered 2022-01-15 – 2022-01-16 (×2): 1 via ORAL
  Filled 2022-01-15 (×2): qty 1

## 2022-01-15 MED ORDER — POTASSIUM CHLORIDE IN NACL 20-0.9 MEQ/L-% IV SOLN
INTRAVENOUS | Status: DC
  Filled 2022-01-15: qty 1000

## 2022-01-15 MED ORDER — PANTOPRAZOLE SODIUM 40 MG PO TBEC
40.0000 mg | DELAYED_RELEASE_TABLET | Freq: Every day | ORAL | Status: DC
  Administered 2022-01-15 – 2022-01-16 (×2): 40 mg via ORAL
  Filled 2022-01-15 (×2): qty 1

## 2022-01-15 MED ORDER — GUAIFENESIN ER 600 MG PO TB12
600.0000 mg | ORAL_TABLET | Freq: Two times a day (BID) | ORAL | Status: DC
  Administered 2022-01-15 – 2022-01-16 (×3): 600 mg via ORAL
  Filled 2022-01-15 (×3): qty 1

## 2022-01-15 MED ORDER — TAMSULOSIN HCL 0.4 MG PO CAPS
0.4000 mg | ORAL_CAPSULE | Freq: Every day | ORAL | Status: DC
  Administered 2022-01-15 – 2022-01-16 (×2): 0.4 mg via ORAL
  Filled 2022-01-15 (×2): qty 1

## 2022-01-15 MED ORDER — GUAIFENESIN-DM 100-10 MG/5ML PO SYRP: 10.0000 mL | ORAL_SOLUTION | ORAL | Status: DC | PRN

## 2022-01-15 MED ORDER — POTASSIUM CHLORIDE 20 MEQ PO PACK
40.0000 meq | PACK | Freq: Once | ORAL | Status: AC
  Administered 2022-01-15: 40 meq via ORAL
  Filled 2022-01-15: qty 2

## 2022-01-15 MED ORDER — VITAMIN C 500 MG PO TABS
500.0000 mg | ORAL_TABLET | Freq: Every day | ORAL | Status: DC
  Administered 2022-01-15 – 2022-01-16 (×2): 500 mg via ORAL
  Filled 2022-01-15 (×2): qty 1

## 2022-01-15 MED ORDER — ZINC SULFATE 220 (50 ZN) MG PO CAPS
220.0000 mg | ORAL_CAPSULE | Freq: Every day | ORAL | Status: DC
  Administered 2022-01-15 – 2022-01-16 (×2): 220 mg via ORAL
  Filled 2022-01-15 (×2): qty 1

## 2022-01-15 MED ORDER — NITROGLYCERIN 0.4 MG SL SUBL: 0.4000 mg | SUBLINGUAL_TABLET | SUBLINGUAL | Status: DC | PRN

## 2022-01-15 MED ORDER — FUROSEMIDE 40 MG PO TABS: 20.0000 mg | ORAL_TABLET | Freq: Every day | ORAL | Status: DC

## 2022-01-15 MED ORDER — FOLIC ACID 1 MG PO TABS
1.0000 mg | ORAL_TABLET | Freq: Every day | ORAL | Status: DC
  Administered 2022-01-15 – 2022-01-16 (×2): 1 mg via ORAL
  Filled 2022-01-15 (×2): qty 1

## 2022-01-15 MED ORDER — CLOPIDOGREL BISULFATE 75 MG PO TABS
75.0000 mg | ORAL_TABLET | Freq: Every day | ORAL | Status: DC
  Administered 2022-01-15 – 2022-01-16 (×2): 75 mg via ORAL
  Filled 2022-01-15 (×2): qty 1

## 2022-01-15 MED ORDER — TRAZODONE HCL 50 MG PO TABS: 25.0000 mg | ORAL_TABLET | Freq: Every evening | ORAL | Status: DC | PRN

## 2022-01-15 MED ORDER — NIRMATRELVIR/RITONAVIR (PAXLOVID)TABLET
3.0000 | ORAL_TABLET | Freq: Two times a day (BID) | ORAL | Status: DC
  Administered 2022-01-15 – 2022-01-16 (×3): 3 via ORAL
  Filled 2022-01-15: qty 30

## 2022-01-15 NOTE — Assessment & Plan Note (Signed)
-   I expect correction of acidosis with hydration. - We will monitor his BMP.

## 2022-01-15 NOTE — Assessment & Plan Note (Signed)
-   We will continue aspirin, Plavix and beta-blocker therapy with Coreg.

## 2022-01-15 NOTE — H&P (Signed)
[place]   PATIENT NAME: Blake [name]    MR#:  [id number]  DATE OF BIRTH:  05-31-1950  DATE OF ADMISSION:  01/15/2022  PRIMARY CARE PHYSICIAN: Dorothey Baseman, MD   Patient is coming from: Home  REQUESTING/REFERRING PHYSICIAN: Pilar Jarvis, MD  CHIEF COMPLAINT:   Chief Complaint  Patient presents with   COVID   Chest Pain    HISTORY OF PRESENT ILLNESS:  Blake [name] is a 71 y.o. Caucasian male with medical history significant for coronary artery disease, hypertension, chronic systolic CHF, dyslipidemia, CVA dyslipidemia major depression, dementia, TBI with subsequent cognitive deficit and Asperger's syndrome as well as BPH, who presented to the emergency room with acute onset of hypoxemia with a pulse oximetry of 88% on room air with recently diagnosed COVID-19 yesterday and associated myalgias since Monday, cough and headache.  He has been having body aches and rhinorrhea as well as sore throat with cough occasionally productive of clear sputum since Monday.  He had been vaccinated for COVID-19 as well as boosted.  No reported fever or chills.  He has been having difficult time sleeping last couple of days and is more confused than his baseline.  His wife stated that he has been having auditory hallucinations.  His confusion has been gradually worsening since the beginning with symptoms on Monday.  No recent falls or head trauma.  No chest pain or palpitations.  No nausea or vomiting or diarrhea.  No dysuria, oliguria or hematuria or flank pain.  ED Course: When he came to the ER, BP was 142/91 with a heart rate of 111 and respiratory rate of 26 and pulse oximetry of 91% on room air and 98% on 2 L O2 by nasal cannula.  Labs revealed mild hyponatremia 134 and hypokalemia of 3.4 with a CO2 of 18 and calcium of 8.8.  High sensitive troponin was 59 and later 58.  CBC was within normal. EKG as reviewed by me : EKG showed sinus tachycardia with a rate of 109 with possible left  atrial enlargement and anteroseptal Q waves. Imaging: Two-view chest x-ray showed no acute cardiopulmonary disease.  The patient was given 650 mg of p.o. Tylenol and 1 L bolus of IV normal saline.  He will be admitted to a medical telemetry bed for further evaluation and management. 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/11/2013   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   Coronary artery disease    Dementia (HCC)    Depression    Disorder of male genital organs 07/11/2013   Dyspnea    Encounter for long-term (current) use of medications 11/21/2014   Erectile dysfunction 07/11/2013   Executive function deficit 03/06/2016   GERD (gastroesophageal reflux disease)    Headache    stress   Hearing loss    Hyperlipidemia    Hypertension    Hypogonadism, 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   Major depression, recurrent, chronic (HCC) 03/06/2016   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   Mixed  hyperlipidemia 02/02/2014   Myocardial infarction (HCC) 08/2013   OCD (obsessive compulsive disorder)    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   Other specified disorder of male genital organs(608.89) 07/11/2013   Prostatic hypertrophy    Pseudophakia of both eyes 04/20/2015   Raynaud's disease    Reduced libido 07/11/2013   Repeated falls    weak left ankle   Scoliosis    Stroke (HCC) 2/16   "light"   Synovitis    knees, ankles    PAST SURGICAL HISTORY:   Past Surgical History:  Procedure Laterality Date   BACK SURGERY  2011   rods and screws   CLOSED  REDUCTION NASAL FRACTURE Bilateral 06/11/2018   Procedure: CLOSED REDUCTION NASAL FRACTURE;  Surgeon: Vernie Murders, MD;  Location: [facility];  Service: ENT;  Laterality: Bilateral;   COLONOSCOPY  2015   CORONARY ANGIOPLASTY     2015 stent   CORONARY STENT INTERVENTION N/A 06/08/2019   Procedure: CORONARY STENT INTERVENTION;  Surgeon: Alwyn Pea, MD;  Location: [facility];  Service: Cardiovascular;  Laterality: N/A;   CYSTOSCOPY  1984   EYE SURGERY Bilateral 2005,2006,2009   detached retina, cataract   FOOT ARTHRODESIS Left 05/30/2015   Procedure: ARTHRODESIS FOOT STJ LT FOOT;  Surgeon: [name], DPM;  Location: [facility];  Service: Podiatry;  Laterality: Left;  WITH POPLITEAL BLOCK   FOOT SURGERY Left    HERNIA REPAIR Right 1969   inguinal   LEFT HEART CATH AND CORONARY ANGIOGRAPHY Left 06/08/2019   Procedure: LEFT HEART CATH AND CORONARY ANGIOGRAPHY;  Surgeon: Alwyn Pea, MD;  Location: [facility];  Service: Cardiovascular;  Laterality: Left;   SEPTOPLASTY Bilateral 06/11/2018   Procedure: SEPTOPLASTY;  Surgeon: Vernie Murders, MD;  Location: [facility];  Service: ENT;  Laterality: Bilateral;   SHOULDER SURGERY Right 2004   skull surgery     TOE SURGERY Right 2009   TONSILLECTOMY  2008    SOCIAL HISTORY:   Social History   Tobacco Use   Smoking status: Never   Smokeless tobacco: Never  Substance Use Topics   Alcohol use: No    FAMILY HISTORY:  History reviewed. No pertinent family history.  DRUG ALLERGIES:   Allergies  Allergen Reactions   Mirtazapine Other (See Comments)    Other reaction(s): Other (See Comments) Irritability/anger, increased appetite, worsening depression Irritability/anger, increased appetite, worsening depression     REVIEW OF SYSTEMS:   ROS As per history of present illness. All pertinent systems were reviewed above. Constitutional, HEENT, cardiovascular, respiratory, GI, GU, musculoskeletal, neuro,  psychiatric, endocrine, integumentary and hematologic systems were reviewed and are otherwise negative/unremarkable except for positive findings mentioned above in the HPI.   MEDICATIONS AT HOME:   Prior to Admission medications   Medication Sig Start Date End Date Taking? Authorizing Provider  acetaminophen (TYLENOL) 500 MG tablet Take 1,000 mg by mouth every 6 (six) hours as needed for mild pain or moderate pain.    [provider]  aspirin EC 81 MG tablet Take 81 mg by mouth daily.    [provider]  B Complex-C (SUPER B COMPLEX PO) Take 1 tablet by mouth daily.    [provider]  Calcium Carbonate-Vitamin D 600-200 MG-UNIT CAPS Take 1 tablet by mouth 2 (two) times daily.     [provider]  carvedilol (COREG) 3.125 MG tablet Take 3.125 mg by mouth daily.  11/03/18   [provider]  clindamycin (CLEOCIN T) 1 %  lotion Apply 1 application topically 2 (two) times daily as needed.     [provider]  clopidogrel (PLAVIX) 75 MG tablet Take 75 mg by mouth daily. 09/27/18   [provider]  dexlansoprazole (DEXILANT) 60 MG capsule Take 60 mg by mouth every evening.  05/15/15   [provider]  DULoxetine (CYMBALTA) 60 MG capsule Take 60 mg by mouth 2 (two) times daily.  11/17/18   [provider]  ENTRESTO 24-26 MG Take 1 tablet by mouth every 12 (twelve) hours. 11/01/18   [provider]  FIBER PO Take 1 tablet by mouth 2 (two) times daily.     [provider]  folic acid (FOLVITE) 1 MG tablet Take 1 mg by mouth daily.    [provider]  furosemide (LASIX) 20 MG tablet Take 20 mg by mouth daily. 11/03/18   [provider]  lisinopril (PRINIVIL,ZESTRIL) 40 MG tablet Take 1 tablet (40 mg total) by mouth daily. 03/19/16 06/06/19  Cuthriell, Delorise Royals, PA-C  Melatonin 5 MG TABS Take 5 mg by mouth at bedtime. 30 min before bed    [provider]  methotrexate 50 MG/2ML injection  Inject 0.8 mg into the skin once a week. 05/25/19   [provider]  nitroGLYCERIN (NITROSTAT) 0.4 MG SL tablet Place 0.4 mg under the tongue every 2 (two) hours as needed. 05/25/19   [provider]  omeprazole (PRILOSEC) 20 MG capsule Take 20 mg by mouth daily.    [provider]  Probiotic Product (PROBIOTIC DAILY PO) Take 1 tablet by mouth daily. Arts development officer, Historical, MD  silodosin (RAPAFLO) 8 MG CAPS capsule TAKE ONE CAPSULE BY MOUTH DAILY WITH BREAKFAST 12/26/21   Stoioff, Scott C, MD  SYRINGE-NEEDLE, DISP, 3 ML (B-D 3CC LUER-LOK SYR 22GX1-1/2) 22G X 1-1/2" 3 ML MISC USE AS DIRECTED WITH TESTOSTERONE 08/14/21   Stoioff, Verna Czech, MD  tadalafil (CIALIS) 5 MG tablet Take 1 tablet by mouth once daily 12/24/21   Stoioff, Verna Czech, MD  testosterone cypionate (DEPOTESTOSTERONE CYPIONATE) 200 MG/ML injection Inject 0.3 mLs (60 mg total) into the muscle once a week. Please give multidose vial if available 12/19/21   Stoioff, Verna Czech, MD      VITAL SIGNS:  Blood pressure (!) 154/97, pulse (!) 103, temperature 99.9 F (37.7 C), temperature source Oral, resp. rate 16, height 5\' 10"  (1.778 m), weight 90.2 kg, SpO2 98 %.  PHYSICAL EXAMINATION:  Physical Exam  GENERAL:  71 y.o.-year-old Caucasian male patient lying in the bed with no acute distress.  He was still mildly confused and somnolent but arousable. EYES: Pupils equal, round, reactive to light and accommodation. No scleral icterus. Extraocular muscles intact.  HEENT: Head atraumatic, normocephalic. Oropharynx and nasopharynx clear.  NECK:  Supple, no jugular venous distention. No thyroid enlargement, no tenderness.  LUNGS: Normal breath sounds bilaterally, no wheezing, rales,rhonchi or crepitation. No use of accessory muscles of respiration.  CARDIOVASCULAR: Regular rate and rhythm, S1, S2 normal. No murmurs, rubs, or gallops.  ABDOMEN: Soft, nondistended, nontender. Bowel sounds present. No organomegaly or mass.   EXTREMITIES: No pedal edema, cyanosis, or clubbing.  NEUROLOGIC: Cranial nerves II through XII are intact. Muscle strength 5/5 in all extremities. Sensation intact. Gait not checked.  PSYCHIATRIC: The patient is alert and cooperative.  Normal affect and good eye contact. SKIN: No obvious rash, lesion, or ulcer.   LABORATORY PANEL:   CBC Recent Labs  Lab 01/14/22 2114  WBC 9.3  HGB 16.0  HCT 46.4  PLT 188   ------------------------------------------------------------------------------------------------------------------  Chemistries  Recent Labs  Lab 01/14/22 2114  NA 134*  K 3.4*  CL 104  CO2 18*  GLUCOSE 111*  BUN 10  CREATININE 0.97  CALCIUM 8.8*   ------------------------------------------------------------------------------------------------------------------  Cardiac Enzymes No results for input(s): "TROPONINI" in the last 168 hours. ------------------------------------------------------------------------------------------------------------------  RADIOLOGY:  DG Chest 2 View  Result Date: 01/14/2022 CLINICAL DATA:  Chest pain and dyspnea EXAM: CHEST - 2 VIEW COMPARISON:  Radiographs 02/14/2015 FINDINGS: Low lung volumes. Stable cardiomediastinal silhouette. Left chest wall ICD. No focal opacity, pleural effusion, or pneumothorax. No acute osseous abnormality. IMPRESSION: No active cardiopulmonary disease. Electronically Signed   By: Placido Sou M.D.   On: 01/14/2022 21:27      IMPRESSION AND PLAN:  Assessment and Plan: * Acute hypoxemic respiratory failure due to COVID-19 [facility]) - The patient will be admitted to a medical telemetry bed. - We will place the patient on p.o. Paxlovid. - Given his hypoxemia will obtain, will place him on IV steroid therapy with Solu-Medrol. - We will follow inflammatory markers. - Mucolytic therapy will be provided. - Vitamin C, vitamin D3 and zinc sulfate will be given as well as aspirin.  Hypokalemia - We will replace  potassium level and check magnesium level.  Metabolic acidosis - I expect correction of acidosis with hydration. - We will monitor his BMP.  Coronary artery disease - We will continue aspirin, Plavix and beta-blocker therapy with Coreg.  Chronic systolic CHF (congestive heart failure) (HCC) - We will continue beta-blocker therapy with Coreg as well as Entresto as well as Lasix.  Essential hypertension - We will continue his antihypertensives.   DVT prophylaxis: Lovenox.  Advanced Care Planning:  Code Status: full code.  Family Communication:  The plan of care was discussed in details with the patient (and family). I answered all questions. The patient agreed to proceed with the above mentioned plan. Further management will depend upon hospital course. Disposition Plan: Back to previous home environment Consults called: none.  All the records are reviewed and case discussed with ED provider.  Status is: Inpatient   At the time of the admission, it appears that the appropriate admission status for this patient is inpatient.  This is judged to be reasonable and necessary in order to provide the required intensity of service to ensure the patient's safety given the presenting symptoms, physical exam findings and initial radiographic and laboratory data in the context of comorbid conditions.  The patient requires inpatient status due to high intensity of service, high risk of further deterioration and high frequency of surveillance required.  I certify that at the time of admission, it is my clinical judgment that the patient will require inpatient hospital care extending more than 2 midnights.                            Dispo: The patient is from: Home              Anticipated d/c is to: Home              Patient currently is not medically stable to d/c.              Difficult to place patient: No  Christel Mormon M.D on 01/15/2022 at 5:17 AM  [facility]   From 7 PM-7 AM,  contact night-coverage [email or web address]  CC: Primary care physician; Juluis Pitch,  MD

## 2022-01-15 NOTE — Progress Notes (Addendum)
Interval events noted.  He complains of generalized weakness and muscle aches.  He had diarrhea this morning.  Continue Paxlovid and steroids for COVID-19 infection with hypoxic respiratory failure.  He is on 2 L/min oxygen via nasal cannula.  He has received more than a liter of IV fluids.  Discontinue IV fluids to avoid fluid overload because of chronic systolic CHF.  Hold Lasix today because of diarrhea.  Replete potassium and monitor levels.  PT evaluation and fall precautions.  His wife has been advised to notify nursing staff when she leaves the bedside.  Plan of care was discussed with the patient and his wife at the bedside.

## 2022-01-15 NOTE — Plan of Care (Signed)

## 2022-01-15 NOTE — Assessment & Plan Note (Signed)
-   We will continue his antihypertensives. 

## 2022-01-15 NOTE — Assessment & Plan Note (Signed)
-   We will replace potassium level and check magnesium level.

## 2022-01-15 NOTE — Assessment & Plan Note (Signed)
-   We will continue beta-blocker therapy with Coreg as well as Entresto as well as Lasix.

## 2022-01-15 NOTE — ED Notes (Signed)
Pt up in bed, ate 100% of meal provided. States feeling "So much better" continues on 2L [place]. Wife at bedside. Pt with no needs.

## 2022-01-15 NOTE — ED Provider Notes (Signed)
[facility] Provider Note    Event Date/Time   First MD Initiated Contact with Patient 01/15/22 [phone]     (approximate)   History   COVID and Chest Pain   HPI  Blake [name] is a 71 y.o. male   Past medical history of CAD, hyperlipidemia, hypertension, depression, TBI with cognitive impairment, autism, presents with COVID-positive diagnosis from his outpatient doctor and hypoxemia 88% on room air responsive to 2 L nasal cannula.  Symptom onset was Monday with myalgias, cough, headache and was diagnosed with COVID yesterday evening.  He has had multiple doses of the COVID-vaccine.  He denies chest pain.  Per his daughter, he is more confused than his baseline.  He has had poor sleep over the last 2 days.  His confusion has been gradually worsening since the onset of his symptoms on Monday.  He was at his baseline before then.  He has had no recent trauma  History was obtained via the patient and his daughter who is at bedside.      Physical Exam   Triage Vital Signs: ED Triage Vitals  Enc Vitals Group     BP 01/14/22 2107 (!) 142/91     Pulse Rate 01/14/22 2107 (!) 111     Resp 01/14/22 2107 (!) 26     Temp 01/14/22 2107 99.8 F (37.7 C)     Temp Source 01/14/22 2107 Oral     SpO2 01/14/22 2107 91 %     Weight 01/14/22 2105 198 lb 13.7 oz (90.2 kg)     Height 01/14/22 2105 5\' 10"  (1.778 m)     Head Circumference --      Peak Flow --      Pain Score 01/14/22 2104 6     Pain Loc --      Pain Edu? --      Excl. in GC? --     Most recent vital signs: Vitals:   01/15/22 0201 01/15/22 0315  BP: (!) 156/105 (!) 154/97  Pulse: (!) 109 (!) 103  Resp: 16 16  Temp: 99.9 F (37.7 C)   SpO2: 95% 98%    General: Awake, no distress.  Disoriented CV:  Slightly dry mucous membranes Resp:  Normal effort.  No tachypnea, lungs clear to auscultation without wheezing or focalities.  He was hypoxemic to 88% on room air but responsive to 2 L nasal  cannula now on 98% Abd:  No distention.  Nontender    ED Results / Procedures / Treatments   Labs (all labs ordered are listed, but only abnormal results are displayed) Labs Reviewed  BASIC METABOLIC PANEL - Abnormal; Notable for the following components:      Result Value   Sodium 134 (*)    Potassium 3.4 (*)    CO2 18 (*)    Glucose, Bld 111 (*)    Calcium 8.8 (*)    All other components within normal limits  TROPONIN I (HIGH SENSITIVITY) - Abnormal; Notable for the following components:   Troponin I (High Sensitivity) 59 (*)    All other components within normal limits  TROPONIN I (HIGH SENSITIVITY) - Abnormal; Notable for the following components:   Troponin I (High Sensitivity) 58 (*)    All other components within normal limits  RESP PANEL BY RT-PCR (FLU A&B, COVID) ARPGX2  CBC     I reviewed labs and they are notable for troponin flat 58-59  EKG  ED ECG REPORT I,  Lucillie Garfinkel, the attending physician, personally viewed and interpreted this ECG.   Date: 01/15/2022  EKG Time: 2112  Rate: 109  Rhythm: sinus tachycardia  Axis: normal  Intervals:none  ST&T Change: no ischemic changes    RADIOLOGY I independently reviewed and interpreted chest x-ray and see no obvious focal opacity   PROCEDURES:  Critical Care performed: Yes, see critical care procedure note(s)  .Critical Care  Performed by: Lucillie Garfinkel, MD Authorized by: Lucillie Garfinkel, MD   Critical care provider statement:    Critical care time (minutes):  30   Critical care was necessary to treat or prevent imminent or life-threatening deterioration of the following conditions:  Respiratory failure   Critical care was time spent personally by me on the following activities:  Development of treatment plan with patient or surrogate, ordering and review of laboratory studies, ordering and performing treatments and interventions, ordering and review of radiographic studies, pulse oximetry, re-evaluation of  patient's condition, examination of patient and evaluation of patient's response to treatment    MEDICATIONS ORDERED IN ED: Medications  sodium chloride 0.9 % bolus 1,000 mL (has no administration in time range)  acetaminophen (TYLENOL) tablet 650 mg (has no administration in time range)    Consultants:  I spoke with hospitalist for admission and regarding care plan for this patient.   IMPRESSION / MDM / ASSESSMENT AND PLAN / ED COURSE  I reviewed the triage vital signs and the nursing notes.                              Differential diagnosis includes, but is not limited to, hypoxemic respiratory failure in the setting of COVID infection, upper respiratory infection, bacterial pneumonia, considered but less likely ACS    MDM: Patient with known COVID infection and hypoxemia, flat troponins, requiring new O2 requirement 2 L nasal cannula and is stable.  Mildly tachycardic and appears slightly clinically dehydrated will give IV crystalloid bolus.  Admission.   Patient's presentation is most consistent with acute presentation with potential threat to life or bodily function.       FINAL CLINICAL IMPRESSION(S) / ED DIAGNOSES   Final diagnoses:  Hypoxemia  COVID     Rx / DC Orders   ED Discharge Orders     None        Note:  This document was prepared using Dragon voice recognition software and may include unintentional dictation errors.    Lucillie Garfinkel, MD 01/15/22 [phone]

## 2022-01-15 NOTE — ED Notes (Signed)
Assumed care, pt alert and oriented on 2L [place] saturation 98%. Wife at bedside. Pt had large BM at shift change. No needs at the moment will monitor.

## 2022-01-15 NOTE — Assessment & Plan Note (Addendum)
-   The patient will be admitted to a medical telemetry bed. - We will place the patient on p.o. Paxlovid. - Given his hypoxemia will obtain, will place him on IV steroid therapy with Solu-Medrol. - We will follow inflammatory markers. - Mucolytic therapy will be provided. - Vitamin C, vitamin D3 and zinc sulfate will be given as well as aspirin.

## 2022-01-15 NOTE — ED Notes (Signed)
Pt continues to be tachycardic, 100-110. MD [facility] aware. Pt with no needs.

## 2022-01-15 NOTE — ED Notes (Signed)
Pt was out of bed without, had large bm on way bathroom. Pt and room cleaned. Pt bed alarm tested and on loud. Wife states if she leaves room will contact RN. Pt re-educated on call bell use. Meds given, MD messaged for diet order. Will monitor.

## 2022-01-16 DIAGNOSIS — U071 COVID-19: Secondary | ICD-10-CM | POA: Diagnosis not present

## 2022-01-16 DIAGNOSIS — E876 Hypokalemia: Secondary | ICD-10-CM | POA: Diagnosis not present

## 2022-01-16 DIAGNOSIS — E871 Hypo-osmolality and hyponatremia: Secondary | ICD-10-CM

## 2022-01-16 DIAGNOSIS — J9601 Acute respiratory failure with hypoxia: Secondary | ICD-10-CM | POA: Diagnosis not present

## 2022-01-16 DIAGNOSIS — I5022 Chronic systolic (congestive) heart failure: Secondary | ICD-10-CM | POA: Diagnosis not present

## 2022-01-16 LAB — CBC WITH DIFFERENTIAL/PLATELET
Abs Immature Granulocytes: 0.1 10*3/uL — ABNORMAL HIGH (ref 0.00–0.07)
Basophils Absolute: 0 10*3/uL (ref 0.0–0.1)
Basophils Relative: 0 %
Eosinophils Absolute: 0 10*3/uL (ref 0.0–0.5)
Eosinophils Relative: 0 %
HCT: 43.4 % (ref 39.0–52.0)
Hemoglobin: 14.7 g/dL (ref 13.0–17.0)
Immature Granulocytes: 1 %
Lymphocytes Relative: 6 %
Lymphs Abs: 0.9 10*3/uL (ref 0.7–4.0)
MCH: 30.9 pg (ref 26.0–34.0)
MCHC: 33.9 g/dL (ref 30.0–36.0)
MCV: 91.2 fL (ref 80.0–100.0)
Monocytes Absolute: 0.7 10*3/uL (ref 0.1–1.0)
Monocytes Relative: 5 %
Neutro Abs: 13.7 10*3/uL — ABNORMAL HIGH (ref 1.7–7.7)
Neutrophils Relative %: 88 %
Platelets: 187 10*3/uL (ref 150–400)
RBC: 4.76 MIL/uL (ref 4.22–5.81)
RDW: 13.2 % (ref 11.5–15.5)
WBC: 15.4 10*3/uL — ABNORMAL HIGH (ref 4.0–10.5)
nRBC: 0 % (ref 0.0–0.2)

## 2022-01-16 LAB — PHOSPHORUS: Phosphorus: 2.3 mg/dL — ABNORMAL LOW (ref 2.5–4.6)

## 2022-01-16 LAB — C-REACTIVE PROTEIN: CRP: 7.7 mg/dL — ABNORMAL HIGH (ref <1.0)

## 2022-01-16 LAB — COMPREHENSIVE METABOLIC PANEL
ALT: 19 U/L (ref 0–44)
AST: 23 U/L (ref 15–41)
Albumin: 3.5 g/dL (ref 3.5–5.0)
Alkaline Phosphatase: 56 U/L (ref 38–126)
Anion gap: 6 (ref 5–15)
BUN: 19 mg/dL (ref 8–23)
CO2: 24 mmol/L (ref 22–32)
Calcium: 8.9 mg/dL (ref 8.9–10.3)
Chloride: 109 mmol/L (ref 98–111)
Creatinine, Ser: 0.98 mg/dL (ref 0.61–1.24)
GFR, Estimated: >60 mL/min (ref >60)
Glucose, Bld: 162 mg/dL — ABNORMAL HIGH (ref 70–99)
Potassium: 4.2 mmol/L (ref 3.5–5.1)
Sodium: 139 mmol/L (ref 135–145)
Total Bilirubin: 0.7 mg/dL (ref 0.3–1.2)
Total Protein: 6.6 g/dL (ref 6.5–8.1)

## 2022-01-16 LAB — MAGNESIUM: Magnesium: 2.2 mg/dL (ref 1.7–2.4)

## 2022-01-16 LAB — D-DIMER, QUANTITATIVE: D-Dimer, Quant: 0.71 ug/mL-FEU — ABNORMAL HIGH (ref 0.00–0.50)

## 2022-01-16 LAB — FERRITIN: Ferritin: 124 ng/mL (ref 24–336)

## 2022-01-16 MED ORDER — DIFLUPREDNATE 0.05 % OP EMUL: 1.0000 [drp] | Freq: Three times a day (TID) | OPHTHALMIC | Status: DC

## 2022-01-16 MED ORDER — DORZOLAMIDE HCL 2 % OP SOLN
1.0000 [drp] | Freq: Two times a day (BID) | OPHTHALMIC | Status: DC
  Administered 2022-01-16: 1 [drp] via OPHTHALMIC
  Filled 2022-01-16: qty 10

## 2022-01-16 MED ORDER — NIRMATRELVIR/RITONAVIR (PAXLOVID)TABLET: 3.0000 | ORAL_TABLET | Freq: Two times a day (BID) | ORAL | 0 refills | Status: AC

## 2022-01-16 NOTE — Evaluation (Signed)
Physical Therapy Evaluation Patient Details Name: Blake [name] MRN: [id number] DOB: 22-Jan-1951 Today's Date: 01/16/2022  History of Present Illness  Blake [name] is a 71 y.o. male with a PMHx significant for CAD, hyperlipidemia, hypertension, depression, TBI with cognitive impairment, autism. He presents with COVID-positive diagnosis from his outpatient doctor and hypoxemia 88% on room air responsive to 2 L nasal cannula.  Symptom onset was Monday with myalgias, cough, headache and was diagnosed with COVID yesterday evening.  He has had multiple doses of the COVID-vaccine.  He denies chest pain.  Clinical Impression  The pt presents this session without the need for supplemental oxygen as he maintained a oxygen saturation of >92% throughout the session. The pt tolerates ambulating >100' and also ascending/descending stairs with step to gait pattern and use of unilateral handrail, in which he reports is his baseline. The pt is currently demonstrating mobility at his baseline level of functioning, no further skilled PT needs identified. PT will sign off, please reconsult if needs change.        Recommendations for follow up therapy are one component of a multi-disciplinary discharge planning process, led by the attending physician.  Recommendations may be updated based on patient status, additional functional criteria and insurance authorization.  Follow Up Recommendations No PT follow up      Assistance Recommended at Discharge None  Patient can return home with the following  Help with stairs or ramp for entrance    Equipment Recommendations    Recommendations for Other Services       Functional Status Assessment Patient has had a recent decline in their functional status and demonstrates the ability to make significant improvements in function in a reasonable and predictable amount of time.     Precautions / Restrictions Precautions Precautions: Fall      Mobility  Bed  Mobility Overal bed mobility: Independent                  Transfers Overall transfer level: Needs assistance Equipment used: None Transfers: Sit to/from Stand Sit to Stand: Supervision                Ambulation/Gait Ambulation/Gait assistance: Min guard, Supervision Gait Distance (Feet): 200 Feet Assistive device: None         General Gait Details: Pt demonstrates some deviations of gait when walking with head turns. Wife present and states that this is his baseline.  Stairs Stairs: Yes Stairs assistance: Supervision Stair Management: One rail Left, Step to pattern, Forwards Number of Stairs: 8    Wheelchair Mobility    Modified Rankin (Stroke Patients Only)       Balance Overall balance assessment: Needs assistance Sitting-balance support: Feet unsupported Sitting balance-Leahy Scale: Good     Standing balance support: No upper extremity supported Standing balance-Leahy Scale: Good                               Pertinent Vitals/Pain Pain Assessment Pain Assessment: No/denies pain    Home Living Family/patient expects to be discharged to:: Private residence Living Arrangements: Spouse/significant other Available Help at Discharge: Family;Available PRN/intermittently Type of Home: House Home Access: Stairs to enter Entrance Stairs-Rails: Right Entrance Stairs-Number of Steps: 5 Alternate Level Stairs-Number of Steps: 10 Home Layout: Multi-level        Prior Function Prior Level of Function : Driving;Independent/Modified Independent  Hand Dominance   Dominant Hand: Right    Extremity/Trunk Assessment   Upper Extremity Assessment Upper Extremity Assessment: Overall WFL for tasks assessed         Cervical / Trunk Assessment Cervical / Trunk Assessment: Normal  Communication   Communication: No difficulties  Cognition Arousal/Alertness: Awake/alert Behavior During Therapy: WFL for tasks  assessed/performed Overall Cognitive Status: Within Functional Limits for tasks assessed                                          General Comments      Exercises     Assessment/Plan    PT Assessment Patient does not need any further PT services  PT Problem List Decreased balance       PT Treatment Interventions      PT Goals (Current goals can be found in the Care Plan section)  Acute Rehab PT Goals Patient Stated Goal: to return home PT Goal Formulation: With patient Time For Goal Achievement: 01/23/22 Potential to Achieve Goals: Good    Frequency       Co-evaluation               AM-PAC PT "6 Clicks" Mobility  Outcome Measure Help needed turning from your back to your side while in a flat bed without using bedrails?: None Help needed moving from lying on your back to sitting on the side of a flat bed without using bedrails?: None Help needed moving to and from a bed to a chair (including a wheelchair)?: None Help needed standing up from a chair using your arms (e.g., wheelchair or bedside chair)?: None Help needed to walk in hospital room?: None Help needed climbing 3-5 steps with a railing? : A Little 6 Click Score: 23    End of Session Equipment Utilized During Treatment: Gait belt Activity Tolerance: Patient tolerated treatment well Patient left: in bed;with bed alarm set;with family/visitor present Nurse Communication: Mobility status PT Visit Diagnosis: Unsteadiness on feet (R26.81)    Time: 5400-8676 PT Time Calculation (min) (ACUTE ONLY): 32 min   Charges:   PT Evaluation $PT Eval Low Complexity: 1 Low PT Treatments $Gait Training: 8-22 mins        12:09 PM, 01/16/22 Myeshia Fojtik A. Saverio Danker PT, DPT Physical Therapist - [facility]   Lashena Signer A Rakan Soffer 01/16/2022, 12:05 PM

## 2022-01-16 NOTE — TOC Initial Note (Signed)
Transition of Care [facility]) - Initial/Assessment Note    Patient Details  Name: Blake [name] MRN: [id number] Date of Birth: [birth year]  Transition of [facility]) CM/SW Contact:    Beverly Sessions, RN Phone Number: 01/16/2022, 2:43 PM  Clinical Narrative:                    Patient to discharge today Wife at bedside Wife to transport at discharge No indication for PT or O2 at discharge  Discussed option of home health RN for dc follow up and CHF management.  Wife in agreement.  She states she does not have a preference of agency.  Referral made to [facility]      Patient Goals and CMS Choice        Expected Discharge Plan and Services           Expected Discharge Date: 01/16/22                                    Prior Living Arrangements/Services                       Activities of Daily Living Home Assistive Devices/Equipment: Gilford Rile (specify type) ADL Screening (condition at time of admission) Patient's cognitive ability adequate to safely complete daily activities?: Yes Is the patient deaf or have difficulty hearing?: No Does the patient have difficulty seeing, even when wearing glasses/contacts?: No Does the patient have difficulty concentrating, remembering, or making decisions?: Yes Patient able to express need for assistance with ADLs?: Yes Does the patient have difficulty dressing or bathing?: No Independently performs ADLs?: Yes (appropriate for developmental age) Does the patient have difficulty walking or climbing stairs?: Yes Weakness of Legs: Both Weakness of Arms/Hands: None  Permission Sought/Granted                  Emotional Assessment              Admission diagnosis:  Hypoxemia [R09.02] Acute hypoxemic respiratory failure due to COVID-19 ([place]) [U07.1, J96.01] COVID [U07.1] Patient Active Problem List   Diagnosis Date Noted   Acute hypoxemic respiratory failure due to COVID-19 ([place])  01/15/2022   Essential hypertension 97/05/6376   Chronic systolic CHF (congestive heart failure) ([place]) 01/15/2022   Coronary artery disease 01/15/2022   Hypokalemia 58/85/0277   Metabolic acidosis 41/28/7867   S/P insertion of non-drug eluting coronary artery stent 06/08/2019   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) 06/05/2017   Chronic left shoulder pain 04/03/2016   Cognitive deficit as late effect of traumatic brain injury ([place]) 03/06/2016   Executive function deficit 03/06/2016   Major depression, recurrent, chronic ([place]) 03/06/2016   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   Pseudophakia of both eyes 04/20/2015   Encounter for long-term (current) use of medications 11/21/2014   Hypertension 02/02/2014   Mixed hyperlipidemia 02/02/2014   CAD (coronary artery disease) 09/21/2013   BPH with obstruction/lower urinary tract symptoms 07/11/2013   Chronic prostatitis 07/11/2013   Reduced libido 07/11/2013   Disorder of male genital organs 07/11/2013   Erectile dysfunction 07/11/2013   Hypogonadism, 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   Other specified disorder of male genital organs(608.89) 07/11/2013   GERD (gastroesophageal reflux disease) 01/11/2013   PCP:  Juluis Pitch, MD Pharmacy:   [id number] [place] [facility],  [place] - [address] [place] [address] Phone: [phone] Fax: [phone]  [facility] [id number] [place], [place] - [address] [place] [address] Phone: [phone] Fax: [phone]  [facility] [address] [place] - [address] [place] [address] Phone: [phone] Fax: [phone]  [facility] [address] [place] - [address] [place] [address] Phone: [phone] Fax: [phone]  [facility] - [place], [place] - [address] [place] [address] Phone: [phone] Fax: [phone]     Social Determinants of Health (SDOH) Interventions    Readmission Risk Interventions     No data to display

## 2022-01-16 NOTE — Discharge Summary (Addendum)
Physician Discharge Summary   Patient: Blake [name] MRN: [id number] DOB: 04-Sep-1950  Admit date:     01/15/2022  Discharge date: 01/16/2022  Discharge Physician: Lurene Shadow   PCP: Dorothey Baseman, MD   Recommendations at discharge:   Follow-up with PCP in 1 week  Discharge Diagnoses: Principal Problem:   Acute hypoxemic respiratory failure due to COVID-19 [facility]) Active Problems:   Hypokalemia   Metabolic acidosis   Essential hypertension   Chronic systolic CHF (congestive heart failure) (H[facility])   Coronary artery disease   Hyponatremia  Resolved Problems:   * No resolved hospital problems. 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  He had tested positive for COVID-19 infection at his PCPs office but his oxygen saturation was between 88 to 91% on room air.  His PCP recommended that he come to the hospital for further management.  He was admitted to the hospital for acute hypoxic respiratory failure due to COVID-19 infection.  He was treated with IV fluids, Paxlovid and oxygen via nasal cannula.  He was hyponatremic on admission but this improved with IV fluids.  He also had hypokalemia that was repleted.  His condition improved significantly and he was successfully weaned off oxygen.  He was able to ambulate with physical therapist without any oxygen desaturation.  He feels much better and he is deemed stable for discharge to home today.  Discharge plan was discussed with the patient and his wife at the bedside.        Consultants: None Procedures performed: None  Disposition: Home Diet recommendation:  Discharge  Diet Orders (From admission, onward)     Start     Ordered   01/16/22 0000  Diet - low sodium heart healthy        01/16/22 1248           Cardiac diet DISCHARGE MEDICATION: Allergies as of 01/16/2022       Reactions   Mirtazapine Other (See Comments)   Other reaction(s): Other (See Comments) Irritability/anger, increased appetite, worsening depression Irritability/anger, increased appetite, worsening depression        Medication List     STOP taking these medications    dexlansoprazole 60 MG capsule Commonly known as: DEXILANT   methotrexate 50 MG/2ML injection   silodosin 8 MG Caps capsule Commonly known as: RAPAFLO       TAKE these medications    acetaminophen 500 MG tablet Commonly known as: TYLENOL Take 1,000 mg by mouth every 6 (six) hours as needed for mild pain or moderate pain.   aspirin EC 81 MG tablet Take 81 mg by mouth daily.   B-D 3CC LUER-LOK SYR 22GX1-1/2 22G X 1-1/2" 3 ML Misc Generic drug: SYRINGE-NEEDLE (DISP) 3 ML USE AS DIRECTED WITH TESTOSTERONE   Calcium Carbonate-Vitamin D 600-200 MG-UNIT Caps Take 1 tablet by mouth 2 (two) times daily.   carvedilol 3.125 MG tablet Commonly known as: COREG Take 3.125 mg by mouth daily.   clindamycin 1 % lotion Commonly known as: CLEOCIN T Apply 1 application topically 2 (two) times daily as needed.   clopidogrel 75 MG tablet Commonly known as: PLAVIX Take 75 mg by mouth daily.   Difluprednate 0.05 %  Emul Place 1 drop into the right eye 3 (three) times daily.   dorzolamide 2 % ophthalmic solution Commonly known as: TRUSOPT Place 1 drop into the right eye 2 (two) times daily.   DULoxetine 60 MG capsule Commonly known as: CYMBALTA Take 60 mg by mouth 2 (two) times daily.   Entresto 24-26 MG Generic drug: sacubitril-valsartan Take 1 tablet by mouth every 12 (twelve) hours.   FIBER PO Take 1 tablet by mouth 2 (two) times daily.   folic acid 1 MG tablet Commonly known as:  FOLVITE Take 1 mg by mouth daily.   furosemide 20 MG tablet Commonly known as: LASIX Take 20 mg by mouth daily.   latanoprost 0.005 % ophthalmic solution Commonly known as: XALATAN 1 drop daily.   melatonin 5 MG Tabs Take 5 mg by mouth at bedtime. 30 min before bed   nirmatrelvir/ritonavir EUA 20 x 150 MG & 10 x 100MG  Tabs Commonly known as: PAXLOVID Take 3 tablets by mouth 2 (two) times daily for 7 doses. Patient GFR is >60. Take nirmatrelvir (150 mg) two tablets twice daily for 5 days and ritonavir (100 mg) one tablet twice daily for 5 days.   nitroGLYCERIN 0.4 MG SL tablet Commonly known as: NITROSTAT Place 0.4 mg under the tongue every 2 (two) hours as needed.   omeprazole 20 MG capsule Commonly known as: PRILOSEC Take 20 mg by mouth daily.   PROBIOTIC DAILY PO Take 1 tablet by mouth daily. Senior   SUPER B COMPLEX PO Take 1 tablet by mouth daily.   tadalafil 5 MG tablet Commonly known as: CIALIS Take 1 tablet by mouth once daily   testosterone cypionate 200 MG/ML injection Commonly known as: DEPOTESTOSTERONE CYPIONATE Inject 0.3 mLs (60 mg total) into the muscle once a week. Please give multidose vial if available        Discharge Exam: Filed Weights   01/14/22 2105  Weight: 90.2 kg   GEN: NAD SKIN: Warm and dry EYES: No pallor or icterus ENT: MMM CV: RRR PULM: Bibasilar rales, no wheezing ABD: soft, ND, NT, +BS CNS: AAO x 3, non focal EXT: No edema or tenderness   Condition at discharge: good  The results of significant diagnostics from this hospitalization (including imaging, microbiology, ancillary and laboratory) are listed below for reference.   Imaging Studies: DG Chest 2 View  Result Date: 01/14/2022 CLINICAL DATA:  Chest pain and dyspnea EXAM: CHEST - 2 VIEW COMPARISON:  Radiographs 02/14/2015 FINDINGS: Low lung volumes. Stable cardiomediastinal silhouette. Left chest wall ICD. No focal opacity, pleural effusion, or pneumothorax. No  acute osseous abnormality. IMPRESSION: No active cardiopulmonary disease. Electronically Signed   By: 02/16/2015 M.D.   On: 01/14/2022 21:27    Microbiology: Results for orders placed or performed during the hospital encounter of 01/15/22  Resp Panel by RT-PCR (Flu A&B, Covid) Anterior Nasal Swab     Status: Abnormal   Collection Time: 01/15/22  3:54 AM   Specimen: Anterior Nasal Swab  Result Value Ref Range Status   SARS Coronavirus 2 by RT PCR POSITIVE (A) NEGATIVE Final    Comment: (NOTE) SARS-CoV-2 target nucleic acids are DETECTED.  The SARS-CoV-2 RNA is generally detectable in upper respiratory specimens during the acute phase of infection. Positive results are indicative of the presence of the identified virus, but do not rule out bacterial infection or co-infection with other pathogens not detected by the test. Clinical correlation with patient history and other diagnostic information is necessary to determine  patient infection status. The expected result is Negative.  Fact Sheet for Patients: EntrepreneurPulse.com.au  Fact Sheet for Healthcare Providers: IncredibleEmployment.be  This test is not yet approved or cleared by the [place] FDA and  has been authorized for detection and/or diagnosis of SARS-CoV-2 by FDA under an Emergency Use Authorization (EUA).  This EUA will remain in effect (meaning this test can be used) for the duration of  the COVID-19 declaration under Section 564(b)(1) of the A ct, 21 U.S.C. section 360bbb-3(b)(1), unless the authorization is terminated or revoked sooner.     Influenza A by PCR NEGATIVE NEGATIVE Final   Influenza B by PCR NEGATIVE NEGATIVE Final    Comment: (NOTE) The Xpert Xpress SARS-CoV-2/FLU/RSV plus assay is intended as an aid in the diagnosis of influenza from Nasopharyngeal swab specimens and should not be used as a sole basis for treatment. Nasal washings and aspirates are  unacceptable for Xpert Xpress SARS-CoV-2/FLU/RSV testing.  Fact Sheet for Patients: EntrepreneurPulse.com.au  Fact Sheet for Healthcare Providers: IncredibleEmployment.be  This test is not yet approved or cleared by the [place] FDA and has been authorized for detection and/or diagnosis of SARS-CoV-2 by FDA under an Emergency Use Authorization (EUA). This EUA will remain in effect (meaning this test can be used) for the duration of the COVID-19 declaration under Section 564(b)(1) of the Act, 21 U.S.C. section 360bbb-3(b)(1), unless the authorization is terminated or revoked.  Performed at [facility], [address] [place], [place] [address]     Labs: CBC: Recent Labs  Lab 01/14/22 2114 01/16/22 0616  WBC 9.3 15.4*  NEUTROABS  --  13.7*  HGB 16.0 14.7  HCT 46.4 43.4  MCV 89.7 91.2  PLT 188 536   Basic Metabolic Panel: Recent Labs  Lab 01/14/22 2114 01/15/22 0824 01/16/22 0616  NA 134*  --  139  K 3.4*  --  4.2  CL 104  --  109  CO2 18*  --  24  GLUCOSE 111*  --  162*  BUN 10  --  19  CREATININE 0.97  --  0.98  CALCIUM 8.8*  --  8.9  MG  --  2.2 2.2  PHOS  --   --  2.3*   Liver Function Tests: Recent Labs  Lab 01/16/22 0616  AST 23  ALT 19  ALKPHOS 56  BILITOT 0.7  PROT 6.6  ALBUMIN 3.5   CBG: No results for input(s): "GLUCAP" in the last 168 hours.  Discharge time spent: greater than 30 minutes.  Signed: Jennye Boroughs, MD [facility] 01/16/2022

## 2022-01-20 ENCOUNTER — Other Ambulatory Visit: Payer: Self-pay | Admitting: Family Medicine

## 2022-01-20 DIAGNOSIS — E291 Testicular hypofunction: Secondary | ICD-10-CM

## 2022-01-22 ENCOUNTER — Other Ambulatory Visit: Payer: Self-pay | Admitting: *Deleted

## 2022-01-22 DIAGNOSIS — E291 Testicular hypofunction: Secondary | ICD-10-CM

## 2022-01-22 MED ORDER — TADALAFIL 5 MG PO TABS: ORAL_TABLET | ORAL | 0 refills | Status: DC

## 2022-01-22 MED ORDER — TESTOSTERONE CYPIONATE 200 MG/ML IM SOLN: 60.0000 mg | INTRAMUSCULAR | 0 refills | Status: DC

## 2022-01-24 ENCOUNTER — Other Ambulatory Visit: Payer: Self-pay | Admitting: Urology

## 2022-01-24 DIAGNOSIS — E291 Testicular hypofunction: Secondary | ICD-10-CM

## 2022-01-29 ENCOUNTER — Other Ambulatory Visit: Payer: Medicare Other

## 2022-01-29 DIAGNOSIS — E291 Testicular hypofunction: Secondary | ICD-10-CM

## 2022-01-30 ENCOUNTER — Telehealth: Payer: Self-pay | Admitting: *Deleted

## 2022-01-30 LAB — TESTOSTERONE: Testosterone: 595 ng/dL (ref 264–916)

## 2022-01-30 MED ORDER — SILODOSIN 8 MG PO CAPS: 8.0000 mg | ORAL_CAPSULE | Freq: Every day | ORAL | 3 refills | Status: DC

## 2022-01-30 MED ORDER — TADALAFIL 5 MG PO TABS: 5.0000 mg | ORAL_TABLET | Freq: Every day | ORAL | 3 refills | Status: DC

## 2022-01-30 NOTE — Telephone Encounter (Signed)
.  left message to have patient return my call.  

## 2022-01-30 NOTE — Telephone Encounter (Signed)
Spoke with patient's wife and advised results rx sent to pharmacy by e-script  

## 2022-01-30 NOTE — Telephone Encounter (Signed)
Left message on voice mail per DPR .  °

## 2022-01-30 NOTE — Telephone Encounter (Signed)
-----   Message from Abbie Sons, MD sent at 01/30/2022  7:38 AM EDT ----- Testosterone level is 595

## 2022-01-30 NOTE — Telephone Encounter (Signed)
Pt wife calling stating that pt needs a 2nd prescription sent to [facility] in order for them to fill the testosterone. Wife states they have a "rule" that in order to fill controlled substances the pt must have 2 prescriptions at the pharmacy. Also wife asking for cialis to be sent to [facility] due to [facility] is charging them $2 per pill. rx sent to pharmacy by e-script for cialis  Wife also asking that Rapaflo be added back to pt's med list. I advised that this was stopped at his 01/15/22 hospital visit. Wife states it should not have been stopped. Please advise if pt should be taking Rapaflo    Spoke with pharmacist at [facility] and testosterone was filled 01/24/2022 for a 233 day supply. Also pt only got 7 tablets of Cialis for $14.00, I advised pharmacy that I would send to [facility].

## 2022-01-30 NOTE — Telephone Encounter (Signed)
Pt's wife LMOM returning your call

## 2022-03-23 ENCOUNTER — Other Ambulatory Visit: Payer: Self-pay | Admitting: Urology

## 2022-06-03 ENCOUNTER — Telehealth: Payer: Self-pay

## 2022-06-03 NOTE — Telephone Encounter (Signed)
Pt called via triage line   Pt went to get his needles and syringes to self administer his testosterone and the pharmacy gave him the following--  1 in 23G needles instead of 1 1/2 22g. [facility] 22G 1 1/2 needles and they are uncertain when they will get them back in stock.   Pt would like to know if he can use the 1 in 23g  needled or do we need to send the rx for 1 1/2   22 G needles to a different pharmacy.   Pls advise.

## 2022-06-03 NOTE — Telephone Encounter (Signed)
Patient wife is going to call us back and let us know where to sent the needle to

## 2022-07-04 ENCOUNTER — Other Ambulatory Visit: Payer: Self-pay

## 2022-07-04 DIAGNOSIS — E291 Testicular hypofunction: Secondary | ICD-10-CM

## 2022-07-04 NOTE — Telephone Encounter (Signed)
Patient called asking for refill on Testosterone. Medication pulled down for review.

## 2022-07-07 NOTE — Telephone Encounter (Signed)
Spoke with pharmacist and this is too early. He received multi dose vial and if he is injecting 0.3 ml once a week he will not be due for refill until early June. Called patient back but spoke with Mrs Portee, ok per DPR on file, and advised of this.

## 2022-08-15 ENCOUNTER — Emergency Department: Payer: Medicare Other

## 2022-08-15 ENCOUNTER — Emergency Department
Admission: EM | Admit: 2022-08-15 | Discharge: 2022-08-15 | Disposition: A | Discharge status: Home / Self Care | Payer: Medicare Other | Attending: Emergency Medicine | Admitting: Emergency Medicine

## 2022-08-15 ENCOUNTER — Other Ambulatory Visit: Payer: Self-pay

## 2022-08-15 DIAGNOSIS — I10 Essential (primary) hypertension: Secondary | ICD-10-CM | POA: Diagnosis not present

## 2022-08-15 DIAGNOSIS — S0993XA Unspecified injury of face, initial encounter: Secondary | ICD-10-CM | POA: Diagnosis present

## 2022-08-15 DIAGNOSIS — S01111A Laceration without foreign body of right eyelid and periocular area, initial encounter: Secondary | ICD-10-CM | POA: Diagnosis not present

## 2022-08-15 DIAGNOSIS — S50819A Abrasion of unspecified forearm, initial encounter: Secondary | ICD-10-CM | POA: Diagnosis not present

## 2022-08-15 DIAGNOSIS — W01198A Fall on same level from slipping, tripping and stumbling with subsequent striking against other object, initial encounter: Secondary | ICD-10-CM | POA: Diagnosis not present

## 2022-08-15 DIAGNOSIS — S0181XA Laceration without foreign body of other part of head, initial encounter: Secondary | ICD-10-CM | POA: Insufficient documentation

## 2022-08-15 DIAGNOSIS — S0990XA Unspecified injury of head, initial encounter: Secondary | ICD-10-CM

## 2022-08-15 DIAGNOSIS — S0083XA Contusion of other part of head, initial encounter: Secondary | ICD-10-CM

## 2022-08-15 DIAGNOSIS — S20319A Abrasion of unspecified front wall of thorax, initial encounter: Secondary | ICD-10-CM | POA: Diagnosis not present

## 2022-08-15 DIAGNOSIS — I2510 Atherosclerotic heart disease of native coronary artery without angina pectoris: Secondary | ICD-10-CM | POA: Insufficient documentation

## 2022-08-15 LAB — CBC
HCT: 43 % (ref 39.0–52.0)
Hemoglobin: 14.3 g/dL (ref 13.0–17.0)
MCH: 29.7 pg (ref 26.0–34.0)
MCHC: 33.3 g/dL (ref 30.0–36.0)
MCV: 89.2 fL (ref 80.0–100.0)
Platelets: 249 10*3/uL (ref 150–400)
RBC: 4.82 MIL/uL (ref 4.22–5.81)
RDW: 13.7 % (ref 11.5–15.5)
WBC: 5.7 10*3/uL (ref 4.0–10.5)
nRBC: 0 % (ref 0.0–0.2)

## 2022-08-15 LAB — BASIC METABOLIC PANEL
Anion gap: 7 (ref 5–15)
BUN: 17 mg/dL (ref 8–23)
CO2: 25 mmol/L (ref 22–32)
Calcium: 8.7 mg/dL — ABNORMAL LOW (ref 8.9–10.3)
Chloride: 105 mmol/L (ref 98–111)
Creatinine, Ser: 1.1 mg/dL (ref 0.61–1.24)
GFR, Estimated: >60 mL/min (ref >60)
Glucose, Bld: 124 mg/dL — ABNORMAL HIGH (ref 70–99)
Potassium: 3.5 mmol/L (ref 3.5–5.1)
Sodium: 137 mmol/L (ref 135–145)

## 2022-08-15 MED ORDER — ACETAMINOPHEN 500 MG PO TABS
1000.0000 mg | ORAL_TABLET | Freq: Once | ORAL | Status: AC
  Administered 2022-08-15: 1000 mg via ORAL
  Filled 2022-08-15: qty 2

## 2022-08-15 NOTE — ED Provider Notes (Signed)
[facility] Emergency Department Provider Note     Event Date/Time   First MD Initiated Contact with Patient 08/15/22 1821     (approximate)   History   Fall   HPI  Blake [name] is a 72 y.o. male with a history of balance issues, HTN, HLD, CAD s/p stent on clopidogrel, presents to the ED for evaluation of a mechanical fall. Patient was apparently standing on the back of a trailer attempting to balance himself on a stack of wood and wood pallets so that he could peer over his fence to see the neighbor cutting some trees down.He apparently lost his balance while trying to walk on the logs, and fell off hitting his face on the same wood logs. He denies LOC. He present with abrasions to the face, arm and chest. No reports of n/v, vision change or weakness.   Physical Exam   Triage Vital Signs: ED Triage Vitals  Enc Vitals Group     BP 08/15/22 1705 124/81     Pulse Rate 08/15/22 1705 79     Resp 08/15/22 1705 18     Temp 08/15/22 1705 98.8 F (37.1 C)     Temp Source 08/15/22 1705 Oral     SpO2 08/15/22 1705 97 %     Weight --      Height --      Head Circumference --      Peak Flow --      Pain Score 08/15/22 1706 5     Pain Loc --      Pain Edu? --      Excl. in GC? --     Most recent vital signs: Vitals:   08/15/22 1705 08/15/22 1922  BP: 124/81 134/86  Pulse: 79 72  Resp: 18 16  Temp: 98.8 F (37.1 C)   SpO2: 97% 98%    General Awake, no distress. NAD HEENT NCAT send for a 2 cm laceration in the vertical lie over the right brow.Marland Kitchen PERRL. EOMI. No rhinorrhea. Mucous membranes are moist.  TMs intact bilaterally.  No hemotympanums appreciated. CV:  Good peripheral perfusion. RRR RESP:  Normal effort. CTA.  Chest wall deformity, if he cannot abrasions or ecchymosis. ABD:  No distention.  Nontender.  Normoactive bowel sounds noted. MSK:  Spinal alignment without midline tenderness, spasm, vomiting, or step-off.  Full active motion of  all extremities noted.   ED Results / Procedures / Treatments   Labs (all labs ordered are listed, but only abnormal results are displayed) Labs Reviewed  BASIC METABOLIC PANEL - Abnormal; Notable for the following components:      Result Value   Glucose, Bld 124 (*)    Calcium 8.7 (*)    All other components within normal limits  CBC     EKG  Vent. rate 79 BPM PR interval 180 ms QRS duration 72 ms QT/QTcB 372/426 ms P-R-T axes 31 50 36 Normal sinus rhythm Anterior infarct (cited on or before 14-Jan-2022) Abnormal ECG When compared with ECG of 14-Jan-2022 21:12, Questionable change in initial forces of Septal leads   RADIOLOGY  {**I personally viewed and evaluated these images as part of my medical decision making, as well as reviewing the written report by the radiologist.  ED Provider Interpretation: no acute findings  CT HEAD WO CONTRAST  Result Date: 08/15/2022 CLINICAL DATA:  Fall EXAM: CT HEAD WITHOUT CONTRAST TECHNIQUE: Contiguous axial images were obtained from the base of the skull  through the vertex without intravenous contrast. RADIATION DOSE REDUCTION: This exam was performed according to the departmental dose-optimization program which includes automated exposure control, adjustment of the mA and/or kV according to patient size and/or use of iterative reconstruction technique. COMPARISON:  Head CT 06/05/2018 FINDINGS: Brain: No evidence of acute infarction, hemorrhage, hydrocephalus, acute extra-axial collection or mass lesion/mass effect. Again seen is encephalomalacia in the bilateral anterior inferior frontal regions and anterior temporal lobes as well as left cerebellum, unchanged from prior. Vascular: No hyperdense vessel or unexpected calcification. Skull: No acute fracture Sinuses/Orbits: Paranasal sinuses are clear. Old right medial orbital wall fracture. Postsurgical changes in both globes. Other: Old nasal bone fractures are present. There is scalp soft  tissue swelling and laceration overlying the right anterior frontal region. IMPRESSION: 1. No acute intracranial process. 2. Scalp soft tissue swelling and laceration overlying the right anterior frontal region. 3. Stable encephalomalacia in the bilateral frontal and temporal lobes and left cerebellum. Electronically Signed   By: Darliss Cheney M.D.   On: 08/15/2022 17:48     PROCEDURES:  Critical Care performed: No  ..Laceration Repair  Date/Time: 08/15/2022 7:07 PM  Performed by: Lissa Hoard, PA-C Authorized by: Lissa Hoard, PA-C   Consent:    Consent obtained:  Verbal   Consent given by:  Patient   Risks, benefits, and alternatives were discussed: yes     Risks discussed:  Pain and poor cosmetic result Universal protocol:    Site/side marked: yes     Patient identity confirmed:  Verbally with patient Laceration details:    Location:  Face   Face location:  R eyebrow   Length (cm):  2   Depth (mm):  4 Pre-procedure details:    Preparation:  Patient was prepped and draped in usual sterile fashion Exploration:    Limited defect created (wound extended): no     Hemostasis achieved with:  Direct pressure Treatment:    Area cleansed with:  Saline   Amount of cleaning:  Standard   Irrigation solution:  Sterile saline   Irrigation volume:  10   Irrigation method:  Syringe   Debridement:  None   Undermining:  None   Scar revision: no   Skin repair:    Repair method:  Tissue adhesive Approximation:    Approximation:  Close Repair type:    Repair type:  Simple Post-procedure details:    Dressing:  Open (no dressing)   Procedure completion:  Tolerated well, no immediate complications    MEDICATIONS ORDERED IN ED: Medications  acetaminophen (TYLENOL) tablet 1,000 mg (1,000 mg Oral Given 08/15/22 1859)     IMPRESSION / MDM / ASSESSMENT AND PLAN / ED COURSE  I reviewed the triage vital signs and the nursing notes.                               Differential diagnosis includes, but is not limited to, head injury, facial contusion, subdural hemorrhage, facial laceration  Patient's presentation is most consistent with acute complicated illness / injury requiring diagnostic workup.  Patient's diagnosis is consistent with head injury and facial contusion, resulting in a facial laceration.  Patient with a reassuring exam and workup overall with no signs of any acute neuromuscular deficits.  No CT evidence of any closed head injury or intracranial process.  Patient will be discharged home with care instructions and supplies. Patient is to follow up with primary provider  as needed or otherwise directed. Patient is given ED precautions to return to the ED for any worsening or new symptoms.  FINAL CLINICAL IMPRESSION(S) / ED DIAGNOSES   Final diagnoses:  Contusion of face, initial encounter  Minor head injury, initial encounter  Facial laceration, initial encounter     Rx / DC Orders   ED Discharge Orders     None        Note:  This document was prepared using Dragon voice recognition software and may include unintentional dictation errors.    Lissa Hoard, PA-C 08/15/22 2052    Jene Every, MD 08/17/22 [id number]

## 2022-08-15 NOTE — ED Notes (Signed)
RN attempted lab draw x2 without success

## 2022-08-15 NOTE — ED Triage Notes (Addendum)
Pt to ED via POV from home. Pt was on the back of the trailer on a stack of wood and couldn't see out of his glasses and fell and hit his head. Pt denies LOC. Pt with abrasions to forehead, right arm, chest and chin. Pt is on blood thinners. Pt denies blurry vision, N/V. Pt has defibrillator

## 2022-08-15 NOTE — ED Notes (Signed)
Lab here to draw blood.

## 2022-08-15 NOTE — Discharge Instructions (Signed)
Your exam, labs, EKG, and CT scan are normal and reassuring following your fall. Keep the wound clean and dry. Take OTC Tylenol as discussed. Follow-up with your primary provider as needed.

## 2022-08-25 ENCOUNTER — Other Ambulatory Visit (HOSPITAL_COMMUNITY): Payer: Self-pay | Admitting: Student

## 2022-08-25 DIAGNOSIS — S069X0S Unspecified intracranial injury without loss of consciousness, sequela: Secondary | ICD-10-CM

## 2022-08-25 DIAGNOSIS — R2689 Other abnormalities of gait and mobility: Secondary | ICD-10-CM

## 2022-08-25 DIAGNOSIS — R296 Repeated falls: Secondary | ICD-10-CM

## 2022-08-28 ENCOUNTER — Other Ambulatory Visit: Payer: Self-pay

## 2022-08-28 DIAGNOSIS — E291 Testicular hypofunction: Secondary | ICD-10-CM

## 2022-08-28 NOTE — Telephone Encounter (Signed)
Patient called stating that he misplaced his Testosterone medication vial and has not had medication since February 2024. He would like to know if he can go ahead and get a refill. He is not sure what pharmacy he will use at [facility] does not have this in stock. He wants to know first if he would be able to get this medication now first. Please advise. CB [phone]

## 2022-08-29 NOTE — Telephone Encounter (Signed)
Will refill since his last refill was 01/22/2022.  Not many pharmacies in [place] carry the 10 mL vial so I do not know where he wants it sent.  Can send in 1 cc vials to any pharmacy

## 2022-08-29 NOTE — Telephone Encounter (Signed)
LMOM for patient to return call and let us know what pharmacy he would like to use.

## 2022-09-01 NOTE — Telephone Encounter (Signed)
Patient called back and would like to have RX sent to [facility] in [place]. Medication pulled down for review and to be refilled

## 2022-09-01 NOTE — Telephone Encounter (Signed)
LMOM for patient to return call.

## 2022-09-01 NOTE — Addendum Note (Signed)
Addended by: Consuella Lose on: 09/01/2022 03:51 PM   Modules accepted: Orders

## 2022-09-02 MED ORDER — TESTOSTERONE CYPIONATE 200 MG/ML IM SOLN: 60.0000 mg | INTRAMUSCULAR | 0 refills | Status: DC

## 2022-09-02 NOTE — Telephone Encounter (Signed)
Blake [name] advised

## 2022-10-29 ENCOUNTER — Ambulatory Visit (HOSPITAL_COMMUNITY): Admission: RE | Admit: 2022-10-29 | Payer: Medicare Other | Source: Ambulatory Visit

## 2022-10-29 ENCOUNTER — Encounter (HOSPITAL_COMMUNITY): Payer: Self-pay

## 2022-11-14 ENCOUNTER — Other Ambulatory Visit: Payer: Self-pay | Admitting: Urology

## 2022-11-14 DIAGNOSIS — E291 Testicular hypofunction: Secondary | ICD-10-CM

## 2022-11-14 NOTE — Telephone Encounter (Signed)
Pt has enough testosterone to do one more injections.  He will need another refill that he does once per week.  He is asking when would be a good time for him to come in for labs.

## 2022-11-14 NOTE — Telephone Encounter (Signed)
Not able to tell when patient should have labs repeated or follow up appointment. Will let Dr Lonna Cobb review.

## 2022-11-16 MED ORDER — TESTOSTERONE CYPIONATE 200 MG/ML IM SOLN: 60.0000 mg | INTRAMUSCULAR | 0 refills | Status: DC

## 2022-12-11 ENCOUNTER — Other Ambulatory Visit: Payer: Medicare Other

## 2022-12-11 ENCOUNTER — Other Ambulatory Visit: Payer: Self-pay

## 2022-12-11 DIAGNOSIS — N401 Enlarged prostate with lower urinary tract symptoms: Secondary | ICD-10-CM

## 2022-12-11 DIAGNOSIS — E291 Testicular hypofunction: Secondary | ICD-10-CM

## 2022-12-12 ENCOUNTER — Ambulatory Visit: Payer: Medicare Other | Admitting: Physician Assistant

## 2022-12-12 VITALS — BP 115/81 | HR 82 | Ht 67.0 in | Wt 186.0 lb

## 2022-12-12 DIAGNOSIS — N401 Enlarged prostate with lower urinary tract symptoms: Secondary | ICD-10-CM

## 2022-12-12 DIAGNOSIS — E291 Testicular hypofunction: Secondary | ICD-10-CM

## 2022-12-12 DIAGNOSIS — R351 Nocturia: Secondary | ICD-10-CM

## 2022-12-12 DIAGNOSIS — N138 Other obstructive and reflux uropathy: Secondary | ICD-10-CM | POA: Diagnosis not present

## 2022-12-12 LAB — HEMOGLOBIN AND HEMATOCRIT, BLOOD
Hematocrit: 49.1 % (ref 37.5–51.0)
Hemoglobin: 15.8 g/dL (ref 13.0–17.7)

## 2022-12-12 LAB — TESTOSTERONE: Testosterone: 619 ng/dL (ref 264–916)

## 2022-12-12 LAB — PSA: Prostate Specific Ag, Serum: 3.5 ng/mL (ref 0.0–4.0)

## 2022-12-12 NOTE — Progress Notes (Signed)
12/12/2022 1:53 PM   Blake [name] Feb 25, 1951 [id number]  CC: Chief Complaint  Patient presents with   Benign Prostatic Hypertrophy   HPI: Blake [name] is a 72 y.o. 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   Today he reports no new urinary symptoms in the past year.  He is taking silodosin midmorning and notes that his dysuria can worsen toward the end of his 24-hour dosing.Marland Kitchen  His nocturia has significantly improved since his last visit, now x 3.  He uses supplemental O2 overnight and I think this is helping considerably.  He is overall pleased with his regimen and wishes to continue it.  Most recent testosterone is therapeutic at 619, PSA stable at 3.5, hematocrit up but still within normal limits at 49.1.  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   Coronary artery disease    Dementia (HCC)    Depression    Disorder of male genital organs 07/11/2013   Dyspnea    Encounter for long-term (current) use of medications 11/21/2014   Erectile dysfunction 07/11/2013   Executive function deficit 03/06/2016   GERD (gastroesophageal reflux disease)    Headache    stress   Hearing loss    Hyperlipidemia    Hypertension    Hypogonadism, 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   Major depression, recurrent, chronic (HCC) 03/06/2016   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   Mixed hyperlipidemia 02/02/2014   Myocardial infarction (HCC) 08/2013   OCD (obsessive compulsive disorder)    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   Other specified disorder of male genital organs(608.89) 07/11/2013   Prostatic hypertrophy    Pseudophakia of both eyes 04/20/2015   Raynaud's disease    Reduced libido 07/11/2013   Repeated falls    weak left ankle   Scoliosis    Stroke (HCC) 2/16   "light"   Synovitis    knees, ankles    Surgical History: Past Surgical History:  Procedure Laterality Date   BACK SURGERY  2011   rods and screws   CLOSED REDUCTION NASAL FRACTURE Bilateral 06/11/2018   Procedure: CLOSED REDUCTION NASAL FRACTURE;  Surgeon: Vernie Murders, MD;  Location: [facility];  Service: ENT;  Laterality: Bilateral;   COLONOSCOPY  2015   CORONARY ANGIOPLASTY     2015 stent   CORONARY STENT INTERVENTION N/A 06/08/2019   Procedure: CORONARY STENT INTERVENTION;  Surgeon: Alwyn Pea, MD;  Location: [facility] INVAS[facility];  Service: Cardiovascular;  Laterality: N/A;   CYSTOSCOPY  1984   EYE SURGERY Bilateral 2005,2006,2009   detached retina, cataract   FOOT ARTHRODESIS Left 05/30/2015   Procedure: ARTHRODESIS FOOT STJ LT FOOT;  Surgeon: [name], DPM;  Location: [facility];  Service: Podiatry;  Laterality: Left;  WITH POPLITEAL BLOCK   FOOT SURGERY Left  HERNIA REPAIR Right 1969   inguinal   LEFT HEART CATH AND CORONARY ANGIOGRAPHY Left 06/08/2019   Procedure: LEFT HEART CATH AND CORONARY ANGIOGRAPHY;  Surgeon: Alwyn Pea, MD;  Location: [facility] INVAS[facility] CV [facility];  Service: Cardiovascular;  Laterality: Left;   SEPTOPLASTY Bilateral 06/11/2018   Procedure: SEPTOPLASTY;  Surgeon: Vernie Murders, MD;  Location: [facility] OR[facility];  Service: ENT;  Laterality: Bilateral;   SHOULDER SURGERY Right 2004   skull surgery     TOE SURGERY Right 2009   TONSILLECTOMY  2008    Home Medications:  Allergies as of 12/12/2022        Reactions   Mirtazapine Other (See Comments)   Other reaction(s): Other (See Comments) Irritability/anger, increased appetite, worsening depression Irritability/anger, increased appetite, worsening depression        Medication List        Accurate as of December 12, 2022  1:53 PM. If you have any questions, ask your nurse or doctor.          STOP taking these medications    folic acid 1 MG tablet Commonly known as: FOLVITE   nitroGLYCERIN 0.4 MG SL tablet Commonly known as: NITROSTAT       TAKE these medications    acetaminophen 500 MG tablet Commonly known as: TYLENOL Take 1,000 mg by mouth every 6 (six) hours as needed for mild pain or moderate pain.   aspirin EC 81 MG tablet Take 81 mg by mouth daily.   B-D 3CC LUER-LOK SYR 22GX1-1/2 22G X 1-1/2" 3 ML Misc Generic drug: SYRINGE-NEEDLE (DISP) 3 ML USE AS DIRECTED WITH TESTOSTERONE   Calcium Carbonate-Vitamin D 600-200 MG-UNIT Caps Take 1 tablet by mouth 2 (two) times daily.   carvedilol 3.125 MG tablet Commonly known as: COREG Take 3.125 mg by mouth daily.   clindamycin 1 % lotion Commonly known as: CLEOCIN T Apply 1 application topically 2 (two) times daily as needed.   clopidogrel 75 MG tablet Commonly known as: PLAVIX Take 75 mg by mouth daily.   Difluprednate 0.05 % Emul Place 1 drop into the right eye 3 (three) times daily.   dorzolamide 2 % ophthalmic solution Commonly known as: TRUSOPT Place 1 drop into the right eye 2 (two) times daily.   DULoxetine 60 MG capsule Commonly known as: CYMBALTA Take 60 mg by mouth 2 (two) times daily.   Entresto 24-26 MG Generic drug: sacubitril-valsartan Take 1 tablet by mouth every 12 (twelve) hours.   FIBER PO Take 1 tablet by mouth 2 (two) times daily.   furosemide 20 MG tablet Commonly known as: LASIX Take 20 mg by mouth daily.   latanoprost 0.005 % ophthalmic solution Commonly known as: XALATAN 1 drop daily.   melatonin 5 MG Tabs Take 5 mg  by mouth at bedtime. 30 min before bed   omeprazole 20 MG capsule Commonly known as: PRILOSEC Take 20 mg by mouth daily.   PROBIOTIC DAILY PO Take 1 tablet by mouth daily. Senior   silodosin 8 MG Caps capsule Commonly known as: RAPAFLO Take 1 capsule (8 mg total) by mouth daily with breakfast.   SUPER B COMPLEX PO Take 1 tablet by mouth daily.   tadalafil 5 MG tablet Commonly known as: CIALIS Take 1 tablet (5 mg total) by mouth daily.   testosterone cypionate 200 MG/ML injection Commonly known as: DEPOTESTOSTERONE CYPIONATE Inject 0.3 mLs (60 mg total) into the muscle once a week. Please give multidose vial if available  Allergies:  Allergies  Allergen Reactions   Mirtazapine Other (See Comments)    Other reaction(s): Other (See Comments) Irritability/anger, increased appetite, worsening depression Irritability/anger, increased appetite, worsening depression     Family History: No family history on file.  Social History:   reports that he has never smoked. He has never used smokeless tobacco. He reports that he does not drink alcohol and does not use drugs.  Physical Exam: BP 115/81   Pulse 82   Ht 5\' 7"  (1.702 m)   Wt 186 lb (84.4 kg)   BMI 29.13 kg/m   Constitutional:  Alert and oriented, no acute distress, nontoxic appearing HEENT: [place], AT Cardiovascular: No clubbing, cyanosis, or edema Respiratory: Normal respiratory effort, no increased work of breathing Skin: No rashes, bruises or suspicious lesions Neurologic: Grossly intact, no focal deficits, moving all 4 extremities Psychiatric: Normal mood and affect  Laboratory Data: Results for orders placed or performed in visit on 12/11/22  Hemoglobin and Hematocrit, Blood  Result Value Ref Range   Hemoglobin 15.8 13.0 - 17.7 g/dL   Hematocrit 19.1 47.8 - 51.0 %  PSA  Result Value Ref Range   Prostate Specific Ag, Serum 3.5 0.0 - 4.0 ng/mL  Testosterone  Result Value Ref Range   Testosterone  619 264 - 916 ng/dL   Assessment & Plan:   1. BPH with obstruction/lower urinary tract symptoms Well controlled on silodosin and low-dose daily tadalafil, will continue these.  2. Nocturia Significantly improved on supplemental O2 overnight.  3. Hypogonadism in male Labs appropriate on 0.3 mL testosterone cypionate injected every 7 days, will continue current dose.  Return in about 1 year (around 12/12/2023) for Annual IPSS/PVR with testosterone, H&H, and PSA prior with Dr. Lonna Cobb.  Carman Ching, PA-C  [facility] [place] [address] [place], [place] [address] [phone]

## 2023-01-05 ENCOUNTER — Other Ambulatory Visit: Payer: Medicare Other

## 2023-01-23 ENCOUNTER — Telehealth: Payer: Self-pay

## 2023-01-23 ENCOUNTER — Other Ambulatory Visit: Payer: Self-pay | Admitting: Urology

## 2023-01-23 DIAGNOSIS — E291 Testicular hypofunction: Secondary | ICD-10-CM

## 2023-01-23 NOTE — Telephone Encounter (Signed)
Pt states he needs a refill of Testosterone sent to [facility].   LV- 8/24- f/u 1 year Last refill - 7/24   Pls advise.

## 2023-01-23 NOTE — Telephone Encounter (Signed)
Dr. Lonna Cobb refilled it earlier today.

## 2023-02-17 ENCOUNTER — Other Ambulatory Visit: Payer: Self-pay | Admitting: Urology

## 2023-03-16 ENCOUNTER — Other Ambulatory Visit: Payer: Self-pay | Admitting: Urology

## 2023-04-01 ENCOUNTER — Telehealth: Payer: Self-pay | Admitting: Urology

## 2023-04-01 ENCOUNTER — Other Ambulatory Visit: Payer: Self-pay | Admitting: *Deleted

## 2023-04-01 DIAGNOSIS — E291 Testicular hypofunction: Secondary | ICD-10-CM

## 2023-04-01 MED ORDER — TESTOSTERONE CYPIONATE 200 MG/ML IM SOLN: 100.0000 mg | INTRAMUSCULAR | 0 refills | Status: DC

## 2023-04-01 NOTE — Telephone Encounter (Signed)
Sent to DR. Stoioff

## 2023-04-01 NOTE — Telephone Encounter (Signed)
Patient lvm requesting refill for Testosterone. I tried to call patient back to ask if he has contacted pharmacy, but had to lvm. [facility] is [facility] [address].

## 2023-04-06 ENCOUNTER — Telehealth: Payer: Self-pay | Admitting: *Deleted

## 2023-04-06 NOTE — Telephone Encounter (Signed)
Pt calling asking if his dose of testosterone has been increased, per Shanda Bumps CMA take 0.47ml weekly, pt understood.

## 2023-04-17 ENCOUNTER — Other Ambulatory Visit: Payer: Self-pay | Admitting: Urology

## 2023-05-11 ENCOUNTER — Other Ambulatory Visit: Payer: Self-pay | Admitting: Urology

## 2023-05-28 ENCOUNTER — Other Ambulatory Visit: Payer: Self-pay | Admitting: *Deleted

## 2023-05-28 DIAGNOSIS — E291 Testicular hypofunction: Secondary | ICD-10-CM

## 2023-06-03 ENCOUNTER — Other Ambulatory Visit: Payer: Self-pay

## 2023-06-10 ENCOUNTER — Other Ambulatory Visit: Payer: Medicare Other

## 2023-06-10 DIAGNOSIS — E291 Testicular hypofunction: Secondary | ICD-10-CM

## 2023-06-11 LAB — TESTOSTERONE: Testosterone: 707 ng/dL (ref 264–916)

## 2023-06-11 LAB — HEMOGLOBIN AND HEMATOCRIT, BLOOD
Hematocrit: 47 % (ref 37.5–51.0)
Hemoglobin: 15.9 g/dL (ref 13.0–17.7)

## 2023-06-15 ENCOUNTER — Other Ambulatory Visit: Payer: Self-pay

## 2023-06-15 DIAGNOSIS — E291 Testicular hypofunction: Secondary | ICD-10-CM

## 2023-06-15 NOTE — Telephone Encounter (Addendum)
 Mrs Deniro Laymon called requesting a refill for Testosterone 0.3 ml every 7 days. Patient does not use the same vial once he uses it once he throws the vial out. Per Mrs Heinlen Dr Lonna Cobb is aware. Advised that medication refill request was sent to the provider. Using [facility] in [place].  Arline Asp did want Dr Heywood Footman opinion in regards to patient, patient has been more agitated, has impulsive eating, impulsive behavior ever since his Fall last June 2024. He was put on Depakote which has helped him in his behavior.  Arline Asp wonders if patient should titrate down on the amount of Testosterone he is injecting if the medication and the levels patient is at currently contributing to the issues with behavior. She wanted to get providers opinion on this. Recent level per Arline Asp seems to be on the higher end of normal and maybe too much for the patient?

## 2023-06-16 MED ORDER — TESTOSTERONE CYPIONATE 200 MG/ML IM SOLN: 60.0000 mg | INTRAMUSCULAR | 0 refills | Status: DC

## 2023-06-18 ENCOUNTER — Telehealth: Payer: Self-pay

## 2023-06-18 NOTE — Telephone Encounter (Signed)
 [name], patient's wife, called and left a message stating patient saw Neurologist's PA of Dr Sherryll Burger and was advised it would be beneficial for patient to take less of medication if possible overall. They would like to increase his Depakote dose and decrease his Testosterone dose unless Dr Lonna Cobb feels like the 700 levels are ok for the patent and should not get lower or any other feedback about this medication. Her CB is [phone]

## 2023-06-18 NOTE — Telephone Encounter (Signed)
 Notified patient as instructed, patient wife states they have already picked  up medication . She is do 0.5cc every 14 days.

## 2023-06-18 NOTE — Telephone Encounter (Signed)
 It will be difficult to lower the dose with the 200 mg/cc vial.  Would change to 0.5 cc every 14 days.  If he has not picked up the new Rx for the 200 mg/cc vial could send in a 100 mg/cc vial and he could inject 0.5 cc weekly

## 2023-08-01 ENCOUNTER — Other Ambulatory Visit: Payer: Self-pay | Admitting: Urology

## 2023-08-04 ENCOUNTER — Encounter: Payer: Self-pay | Admitting: Psychiatry

## 2023-08-04 ENCOUNTER — Ambulatory Visit (INDEPENDENT_AMBULATORY_CARE_PROVIDER_SITE_OTHER): Admitting: Psychiatry

## 2023-08-04 VITALS — BP 122/84 | HR 85 | Temp 98.5°F | Ht 67.0 in | Wt 197.0 lb

## 2023-08-04 DIAGNOSIS — F02818 Dementia in other diseases classified elsewhere, unspecified severity, with other behavioral disturbance: Secondary | ICD-10-CM

## 2023-08-04 DIAGNOSIS — F3341 Major depressive disorder, recurrent, in partial remission: Secondary | ICD-10-CM

## 2023-08-04 MED ORDER — DIVALPROEX SODIUM 125 MG PO DR TAB: 250.0000 mg | DELAYED_RELEASE_TABLET | Freq: Two times a day (BID) | ORAL | 2 refills | Status: DC
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
## 2023-08-13 ENCOUNTER — Encounter: Payer: Self-pay | Admitting: Psychiatry

## 2023-08-13 ENCOUNTER — Ambulatory Visit (INDEPENDENT_AMBULATORY_CARE_PROVIDER_SITE_OTHER): Admitting: Psychiatry

## 2023-08-13 VITALS — BP 122/78 | HR 68 | Temp 98.7°F | Ht 67.0 in | Wt 197.2 lb

## 2023-08-13 DIAGNOSIS — F3341 Major depressive disorder, recurrent, in partial remission: Secondary | ICD-10-CM | POA: Diagnosis not present

## 2023-08-13 DIAGNOSIS — S069X9A Unspecified intracranial injury with loss of consciousness of unspecified duration, initial encounter: Secondary | ICD-10-CM | POA: Diagnosis not present

## 2023-08-13 DIAGNOSIS — Z79899 Other long term (current) drug therapy: Secondary | ICD-10-CM

## 2023-08-13 MED ORDER — DIVALPROEX SODIUM ER 500 MG PO TB24: 500.0000 mg | ORAL_TABLET | Freq: Every day | ORAL | 1 refills | Status: DC

## 2023-08-13 NOTE — Patient Instructions (Addendum)
 Change valproic acid 500 mg daily  Obtain las after a week of starting Depkoate (CBC, LFT, valproic acid) at [place] Continue duloxetine  60 mg twice a day Next appointment-  6/5 at 10:30

## 2023-08-17 ENCOUNTER — Telehealth: Payer: Self-pay | Admitting: Urology

## 2023-08-17 NOTE — Telephone Encounter (Signed)
 Patient called to ask Dr. Mindi Alto opinion for an elastic band for treatment of ED. He said that he had read about this treatment, and would like to know Dr. Mindi Alto thoughts about this. Please advise patient. He said a vm can be left on this number: [phone]

## 2023-08-17 NOTE — Telephone Encounter (Signed)
 We dont do that here. He needs to call his pcp and get referral to somewhere that does that .

## 2023-08-17 NOTE — Telephone Encounter (Signed)
 Called patient back at [phone] and lvm advising him that our office does not do this treatment, and that he would need to contact his PCP for referral for office that does this.

## 2023-08-21 ENCOUNTER — Other Ambulatory Visit
Admission: RE | Admit: 2023-08-21 | Discharge: 2023-08-21 | Disposition: A | Discharge status: Home / Self Care | Attending: Psychiatry | Admitting: Psychiatry

## 2023-08-21 DIAGNOSIS — Z79899 Other long term (current) drug therapy: Secondary | ICD-10-CM | POA: Diagnosis present

## 2023-08-21 LAB — HEPATIC FUNCTION PANEL
ALT: 18 U/L (ref 0–44)
AST: 19 U/L (ref 15–41)
Albumin: 4 g/dL (ref 3.5–5.0)
Alkaline Phosphatase: 58 U/L (ref 38–126)
Bilirubin, Direct: <0.1 mg/dL (ref 0.0–0.2)
Total Bilirubin: 1.3 mg/dL — ABNORMAL HIGH (ref 0.0–1.2)
Total Protein: 7.2 g/dL (ref 6.5–8.1)

## 2023-08-21 LAB — VALPROIC ACID LEVEL: Valproic Acid Lvl: 31 ug/mL — ABNORMAL LOW (ref 50–100)

## 2023-08-22 ENCOUNTER — Encounter: Payer: Self-pay | Admitting: Psychiatry

## 2023-08-22 NOTE — Progress Notes (Signed)
 Please contact the patient/family- Labs are within the acceptable range. I recommend continuing the current dose of Depakote  at this time.

## 2023-08-24 ENCOUNTER — Other Ambulatory Visit: Payer: Self-pay

## 2023-08-24 ENCOUNTER — Ambulatory Visit (INDEPENDENT_AMBULATORY_CARE_PROVIDER_SITE_OTHER): Admitting: Psychiatry

## 2023-08-24 ENCOUNTER — Encounter: Payer: Self-pay | Admitting: Psychiatry

## 2023-08-24 ENCOUNTER — Ambulatory Visit: Admitting: Psychiatry

## 2023-08-24 DIAGNOSIS — Z79899 Other long term (current) drug therapy: Secondary | ICD-10-CM

## 2023-08-24 DIAGNOSIS — E291 Testicular hypofunction: Secondary | ICD-10-CM

## 2023-08-24 DIAGNOSIS — F3341 Major depressive disorder, recurrent, in partial remission: Secondary | ICD-10-CM | POA: Diagnosis not present

## 2023-08-24 DIAGNOSIS — S069X9D Unspecified intracranial injury with loss of consciousness of unspecified duration, subsequent encounter: Secondary | ICD-10-CM | POA: Diagnosis not present

## 2023-08-24 MED ORDER — BD LUER-LOK SYRINGE 22G X 1-1/2" 3 ML MISC: 20 refills | Status: DC

## 2023-08-24 MED ORDER — DIVALPROEX SODIUM ER 250 MG PO TB24: ORAL_TABLET | ORAL | 0 refills | Status: DC

## 2023-08-24 NOTE — Progress Notes (Signed)
 [facility] MD/PA/NP OP Progress Note  08/24/2023 5:40 PM Blake [name]  MRN:  [id number]  Chief Complaint:  Chief Complaint  Patient presents with   Follow-up   HPI:  This is a follow-up appointment for TBI, depression.  He states that he has hearing issues.  He could not understand when this writer speaks fast.  He asks her to explain to him on the way back home the last time.  He also states that he has physical pain of the knees.  He does not understand why it is arthritis while it happened acutely. He reports frustration of the doctor speaks to his wife, Blake [name] for collaterals instead of speaking with him. He states that he has initial insomnia, but sleeps through the night (according to Blake [name], he has middle insomnia due to nocturia). He uses cpap machine (per Blake [name], it is not CPAP, but he is on oxygen). He goes for exercise 3 days a week, and occasionally goes back home by himself Blake [name] corrects that he does it only twice a day, he has not done this for 15 years). He reports loss of his brother, a few day ago from pancreatic cancer.  It was not shocking.  Although he was close, he was not like a "buddy." He feels sad, but it does not feel torn by this. Blake [name] states that Blake [name] has "unusual way" of perceiving loss. He is never in tears, although he feels deeply about the loss).  He takes depakote  regularly. He denies any drowsiness.  He denies feeling depressed or anxiety.  He has good appetite.  He denies hallucinations. He denies SI/HI.  Blake [name], his wife states that he is much mellow.  There was 1 incident of him yelling at her.  This was after he put his foot on her accidentally. She uttered some words. (When Blake [name] was asked about this, he states that her reaction was greater than he expected, although he knows pain threshold is different. He offended by the degree of her response.) She sees about 30% of Blake [name], while he was doing much better last year, such as opening the door for her. There was clarity  of focus that time.  She agrees with the plan as outlined below.    Support: wife Household: wife of 25 years Marital status: married Number of children: Employment: International aid/development worker, retired in 2012.  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  Education:  21 years of education  Wt Readings from Last 3 Encounters:  08/13/23 197 lb 3.2 oz (89.4 kg)  08/04/23 197 lb (89.4 kg)  12/12/22 186 lb (84.4 kg)     Visit Diagnosis:    ICD-10-CM   1. Traumatic brain injury with loss of consciousness, subsequent encounter  S06.9X9D     2. MDD (major depressive disorder), recurrent, in partial remission (HCC)  F33.41     3. High risk medication use  Z79.899 Valproic acid level    Hepatic function panel    CBC      Past Psychiatric History: Please see initial evaluation for full details. I have reviewed the history. No updates at this time.     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   Chronic left shoulder pain 04/03/2016   Chronic prostatitis  07/11/2013   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   Coronary artery disease    Dementia (HCC)    Depression    Disorder of male genital organs 07/11/2013   Dyspnea    Encounter for long-term (current) use of medications 11/21/2014   Erectile dysfunction 07/11/2013   Executive function deficit 03/06/2016   GERD (gastroesophageal reflux disease)    Headache    stress   Hearing loss    Hyperlipidemia    Hypertension    Hypogonadism, 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   Major depression, recurrent, chronic (HCC) 03/06/2016   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   Mixed  hyperlipidemia 02/02/2014   Myocardial infarction (HCC) 08/2013   OCD (obsessive compulsive disorder)    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   Other specified disorder of male genital organs(608.89) 07/11/2013   Prostatic hypertrophy    Pseudophakia of both eyes 04/20/2015   Raynaud's disease    Reduced libido 07/11/2013   Repeated falls    weak left ankle   Scoliosis    Stroke (HCC) 2/16   "light"   Synovitis    knees, ankles    Past Surgical History:  Procedure Laterality Date   BACK SURGERY  2011   rods and screws   CLOSED REDUCTION NASAL FRACTURE Bilateral 06/11/2018   Procedure: CLOSED REDUCTION NASAL FRACTURE;  Surgeon: [name], MD;  Location: [facility];  Service: ENT;  Laterality: Bilateral;   COLONOSCOPY  2015   CORONARY ANGIOPLASTY     2015 stent   CORONARY STENT INTERVENTION N/A 06/08/2019   Procedure: CORONARY STENT INTERVENTION;  Surgeon: Antonette Batters, MD;  Location: [facility];  Service: Cardiovascular;  Laterality: N/A;   CYSTOSCOPY  1984   EYE SURGERY Bilateral 2005,2006,2009   detached retina, cataract   FOOT ARTHRODESIS Left 05/30/2015   Procedure: ARTHRODESIS FOOT STJ LT FOOT;  Surgeon: Anell Baptist, DPM;  Location: [facility];  Service: Podiatry;  Laterality: Left;  WITH POPLITEAL BLOCK   FOOT SURGERY Left    HERNIA REPAIR Right 1969   inguinal   LEFT HEART CATH AND CORONARY ANGIOGRAPHY Left 06/08/2019   Procedure: LEFT HEART CATH AND CORONARY ANGIOGRAPHY;  Surgeon: Antonette Batters, MD;  Location: [facility];  Service: Cardiovascular;  Laterality: Left;   SEPTOPLASTY Bilateral 06/11/2018   Procedure: SEPTOPLASTY;  Surgeon: [name], MD;  Location: [facility];  Service: ENT;  Laterality: Bilateral;   SHOULDER SURGERY Right 2004   skull surgery     TOE SURGERY Right 2009   TONSILLECTOMY  2008    Family Psychiatric History: Please see initial evaluation for full details. I have reviewed the history. No updates at  this time.     Family History:  Family History  Problem Relation Age of Onset   Asperger's syndrome Mother     Social History:  Social History   Socioeconomic History   Marital status: Married    Spouse name: Blake [name]   Number of children: 1   Years of education: Not on file   Highest education level: Professional school degree (e.g., MD, DDS, DVM, JD)  Occupational History   Not on file  Tobacco Use   Smoking status: Never   Smokeless tobacco: Never  Vaping Use   Vaping status: Never Used  Substance and Sexual Activity  Alcohol use: No   Drug use: No   Sexual activity: Not Currently    Birth control/protection: None  Other Topics Concern   Not on file  Social History Narrative   Not on file   Social Drivers of Health   Financial Resource Strain: Not on file  Food Insecurity: Not on file  Transportation Needs: Not on file  Physical Activity: Not on file  Stress: Not on file  Social Connections: Not on file    Allergies:  Allergies  Allergen Reactions   Mirtazapine Other (See Comments)    Other reaction(s): Other (See Comments) Irritability/anger, increased appetite, worsening depression Irritability/anger, increased appetite, worsening depression     Metabolic Disorder Labs: No results found for: "HGBA1C", "MPG" No results found for: "PROLACTIN" Lab Results  Component Value Date   CHOL 112 05/23/2014   TRIG 81 05/23/2014   HDL 41 05/23/2014   VLDL 16 05/23/2014   LDLCALC 55 05/23/2014   LDLCALC 124 (H) 09/13/2013   Lab Results  Component Value Date   TSH 3.62 09/13/2013    Therapeutic Level Labs: No results found for: "LITHIUM" Lab Results  Component Value Date   VALPROATE 31 (L) 08/21/2023   No results found for: "CBMZ"  Current Medications: Current Outpatient Medications  Medication Sig Dispense Refill   divalproex  (DEPAKOTE  ER) 250 MG 24 hr tablet Total of 750 mg daily. Take along with 500 mg tab 30 tablet 0   acetaminophen   (TYLENOL ) 500 MG tablet Take 1,000 mg by mouth every 6 (six) hours as needed for mild pain or moderate pain.     aspirin  EC 81 MG tablet Take 81 mg by mouth daily.     B Complex-C (SUPER B COMPLEX PO) Take 1 tablet by mouth daily.     Calcium  Carbonate-Vitamin D  600-200 MG-UNIT CAPS Take 1 tablet by mouth 2 (two) times daily.      carvedilol  (COREG ) 3.125 MG tablet Take 3.125 mg by mouth daily.      clindamycin  (CLEOCIN  T) 1 % lotion Apply 1 application topically 2 (two) times daily as needed.      clopidogrel  (PLAVIX ) 75 MG tablet Take 75 mg by mouth daily.     Difluprednate  0.05 % EMUL Place 1 drop into the right eye 3 (three) times daily.     divalproex  (DEPAKOTE  ER) 500 MG 24 hr tablet Take 1 tablet (500 mg total) by mouth daily. 30 tablet 1   dorzolamide  (TRUSOPT ) 2 % ophthalmic solution Place 1 drop into the right eye 2 (two) times daily.     DULoxetine  (CYMBALTA ) 60 MG capsule Take 60 mg by mouth 2 (two) times daily.      ENTRESTO  24-26 MG Take 1 tablet by mouth every 12 (twelve) hours.     FIBER PO Take 1 tablet by mouth 2 (two) times daily.      furosemide  (LASIX ) 20 MG tablet Take 20 mg by mouth daily.     latanoprost (XALATAN) 0.005 % ophthalmic solution 1 drop daily.     Melatonin 5 MG TABS Take 5 mg by mouth at bedtime. 30 min before bed     omeprazole (PRILOSEC) 20 MG capsule Take 20 mg by mouth daily.     Probiotic Product (PROBIOTIC DAILY PO) Take 1 tablet by mouth daily. Senior     silodosin  (RAPAFLO ) 8 MG CAPS capsule TAKE 1 CAPSULE BY MOUTH DAILY WITH BREAKFAST 90 capsule 3   SYRINGE-NEEDLE, DISP, 3 ML (B-D 3CC LUER-LOK SYR 22GX1-1/2) 22G X  1-1/2" 3 ML MISC USE AS DIRECTED WITH TESTOSTERONE  12 each 20   tadalafil  (CIALIS ) 5 MG tablet Take 1 tablet by mouth once daily 90 tablet 0   testosterone  cypionate (DEPOTESTOSTERONE CYPIONATE) 200 MG/ML injection Inject 0.3 mLs (60 mg total) into the muscle once a week. DISCARD REMAINING AS THESE ARE SINGLE USE VIALS. 5 mL 0   No  current facility-administered medications for this visit.     Musculoskeletal: Strength & Muscle Tone: within normal limits Gait & Station: normal Patient leans: N/A  Psychiatric Specialty Exam: Review of Systems  Psychiatric/Behavioral:  Positive for sleep disturbance. Negative for agitation, behavioral problems, confusion, decreased concentration, dysphoric mood, hallucinations, self-injury and suicidal ideas. The patient is not nervous/anxious and is not hyperactive.   All other systems reviewed and are negative.   There were no vitals taken for this visit.There is no height or weight on file to calculate BMI.  General Appearance: Well Groomed  Eye Contact:  Good  Speech:  Clear and Coherent  Volume:  Normal  Mood:   okay  Affect:  Appropriate, Congruent, and calm  Thought Process:  Coherent  Orientation:  NA  Thought Content: Logical   Suicidal Thoughts:  No  Homicidal Thoughts:  No  Memory:  Immediate;   Fair  Judgement:  Good  Insight:  Present  Psychomotor Activity:  Normal  Concentration:  Concentration: Fair and Attention Span: Fair  Recall:  [employer] of Knowledge: Good  Language: Good  Akathisia:  No  Handed:  Right  AIMS (if indicated): not done  Assets:  Social Support  ADL's:  Intact  Cognition: Impaired,  Mild  Sleep:  Poor   Screenings: GAD-7    Flowsheet Row Office Visit from 08/04/2023 in [facility]  Total GAD-7 Score 0      PHQ2-9    Flowsheet Row Office Visit from 08/04/2023 in [facility]  PHQ-2 Total Score 0      Flowsheet Row Office Visit from 08/04/2023 in [facility] ED from 08/15/2022 in [facility] Department at [facility] ED to Hosp-Admission (Discharged) from 01/15/2022 in [facility]  C-SSRS RISK CATEGORY No Risk No Risk No Risk        Assessment and  Plan:  YORIEL CRISPELL [name] is a 73 y.o. year old male with a history of TBI, hypertension, bradycardia s/p ICD, SVT, CAD, CHF, ischemic cardiomyopathy, hyperlipidemia, OSA on oxygen, hypogonadism on testosterone , COVID infection in Sept 2023, who is referred for depression.   1. Traumatic brain injury with loss of consciousness, subsequent encounter Functional Status   IADL: Independent in the following: managing:            Requires assistance with the following: cooking, medications ADL  Independent in the following: bathing and hygiene, feeding, continence, grooming and toileting, walking          Requires assistance with the following: Folate, Vitamin B12, TSH Images: Head CT 05/2022 Brain: No evidence of acute infarction, hemorrhage, hydrocephalus, acute extra-axial collection or mass lesion/mass effect. Again seen is encephalomalacia in the bilateral anterior inferior frontal regions and anterior temporal lobes as well as left cerebellum, unchanged from prior. Neuropsych assessment: reportedly had neuropsych testing, but unable to locate this Etiology:   He remains calm during the visit and expressed less dissatisfaction toward his wife compared to prior encounters. There remains some discrepancy between his story and the collateral  information, which is consistent with what was noted during the initial visit. He had one incident of raising his voice after she uttered some words when he accidentally stepped on her toe.  This is likely secondary to impaired emotional regulation associated with TBI.  Will uptitrate Depakote  to optimize the dose to target this.  Discussed potential risk of drowsiness.   Discussed with his wife to try to respect his autonomy as much as possible as the episodes appeared to be further complicated by his hearing loss and his difficulty in quickly understanding situations, which may be contributing to his frustration and his perception of being dominated by his wife. She  also agreed to support him by enhancing daily routines and incorporating regular exercise into his schedule.   2. MDD (major depressive disorder), recurrent, in partial remission (HCC) Acute stressors include:  Other stressors include:  History: Dx with depression in 1991, being on duloxetine  60 mg BID since then  Although he reports sadness related to loss of his brother from pancreatic cancer, he denies any other significant mood symptoms on today's evaluation.  He previously reports demoralization secondary to TBI.  Will continue current dose of duloxetine  at this time to target depression.  May consider switching to other antidepressant if any worsening in his mood in the future.   3. High risk medication use Will obtain labs given Depakote  dose is adjusted.     Plan Increase Depakote  ER 750 mg daily  Obtain las after a week of taking Depkoate (CBC, LFT, valproic acid) at  Continue duloxetine  60 mg twice a day Next appointment-  6/5 at 10:30, IP - on melatonin 10 mg   Collaboration of Care: Collaboration of Care: Other reviewed notes in Epic, discuss plans with his wife  Patient/Guardian was advised Release of Information must be obtained prior to any record release in order to collaborate their care with an outside provider. Patient/Guardian was advised if they have not already done so to contact the registration department to sign all necessary forms in order for us  to release information regarding their care.   A total of 42 minutes was spent on the following activities during the encounter date, which includes but is not limited to: preparing to see the patient (e.g., reviewing tests and records), obtaining and/or reviewing separately obtained history, performing a medically necessary examination or evaluation, counseling and educating the patient, family, or caregiver, ordering medications, tests, or procedures, referring and communicating with other healthcare professionals  (when not reported separately), documenting clinical information in the electronic or paper health record, independently interpreting test or lab results and communicating these results to the family or caregiver, and coordinating care (when not reported separately).   Consent: Patient/Guardian gives verbal consent for treatment and assignment of benefits for services provided during this visit. Patient/Guardian expressed understanding and agreed to proceed.    Todd Fossa, MD 08/24/2023, 5:40 PM

## 2023-08-24 NOTE — Patient Instructions (Addendum)
 Increase Depakote  ER 750 mg daily  Obtain las after a week of taking Depkoate (LFT, valproic acid) at [facility] Continue duloxetine  60 mg twice a day Next appointment-  6/5 at 10:30

## 2023-08-24 NOTE — Telephone Encounter (Signed)
 Pt calls and requests refill on syringes, rx sent

## 2023-08-25 ENCOUNTER — Telehealth: Payer: Self-pay

## 2023-08-25 NOTE — Telephone Encounter (Signed)
-----   Message from [place] sent at 08/22/2023  9:06 AM EDT ----- Please contact the patient/family- Labs are within the acceptable range. I recommend continuing the current dose of Depakote  at this time.

## 2023-08-25 NOTE — Telephone Encounter (Signed)
 pt had already been contacted and going to have a repeat labwork done this week

## 2023-08-25 NOTE — Progress Notes (Signed)
 I have tried several times to reach patient no answer left voicemail for patient to return call to office

## 2023-09-01 ENCOUNTER — Ambulatory Visit: Payer: Self-pay | Admitting: Psychiatry

## 2023-09-01 ENCOUNTER — Other Ambulatory Visit
Admission: RE | Admit: 2023-09-01 | Discharge: 2023-09-01 | Disposition: A | Discharge status: Home / Self Care | Source: Ambulatory Visit | Attending: Psychiatry | Admitting: Psychiatry

## 2023-09-01 DIAGNOSIS — Z79899 Other long term (current) drug therapy: Secondary | ICD-10-CM | POA: Diagnosis present

## 2023-09-01 LAB — HEPATIC FUNCTION PANEL
ALT: 18 U/L (ref 0–44)
AST: 20 U/L (ref 15–41)
Albumin: 3.7 g/dL (ref 3.5–5.0)
Alkaline Phosphatase: 54 U/L (ref 38–126)
Bilirubin, Direct: <0.1 mg/dL (ref 0.0–0.2)
Total Bilirubin: 0.8 mg/dL (ref 0.0–1.2)
Total Protein: 6.7 g/dL (ref 6.5–8.1)

## 2023-09-01 NOTE — Addendum Note (Signed)
 Addended by: Ara Bays C on: 09/01/2023 11:49 AM   Modules accepted: Orders

## 2023-09-01 NOTE — Progress Notes (Signed)
 For some reason, the CBC and valproic acid  level were not drawn this time. Could you please ask them if these labs can be added to the order at [facility]?

## 2023-09-03 ENCOUNTER — Ambulatory Visit: Payer: Self-pay | Admitting: Psychiatry

## 2023-09-03 ENCOUNTER — Other Ambulatory Visit
Admission: RE | Admit: 2023-09-03 | Discharge: 2023-09-03 | Disposition: A | Discharge status: Home / Self Care | Source: Ambulatory Visit | Attending: Psychiatry | Admitting: Psychiatry

## 2023-09-03 DIAGNOSIS — Z79899 Other long term (current) drug therapy: Secondary | ICD-10-CM | POA: Diagnosis present

## 2023-09-03 LAB — VALPROIC ACID LEVEL: Valproic Acid Lvl: 42 ug/mL — ABNORMAL LOW (ref 50–100)

## 2023-09-03 LAB — CBC
HCT: 43.3 % (ref 39.0–52.0)
Hemoglobin: 14.8 g/dL (ref 13.0–17.0)
MCH: 31.9 pg (ref 26.0–34.0)
MCHC: 34.2 g/dL (ref 30.0–36.0)
MCV: 93.3 fL (ref 80.0–100.0)
Platelets: 221 10*3/uL (ref 150–400)
RBC: 4.64 MIL/uL (ref 4.22–5.81)
RDW: 13.8 % (ref 11.5–15.5)
WBC: 6.3 10*3/uL (ref 4.0–10.5)
nRBC: 0 % (ref 0.0–0.2)

## 2023-09-03 NOTE — Progress Notes (Signed)
 Please advise that the blood test results are within acceptable range. Although the valproic acid  level is below the typical therapeutic threshold, I recommend continuing the current medication regiment at this time.

## 2023-09-14 ENCOUNTER — Emergency Department

## 2023-09-14 ENCOUNTER — Other Ambulatory Visit: Payer: Self-pay

## 2023-09-14 ENCOUNTER — Inpatient Hospital Stay
Admission: EM | Admit: 2023-09-14 | Discharge: 2023-09-29 | DRG: 004 | Disposition: A | Discharge status: Other Acute Inpatient Hospital | Attending: Pulmonary Disease | Admitting: Pulmonary Disease

## 2023-09-14 DIAGNOSIS — Z8673 Personal history of transient ischemic attack (TIA), and cerebral infarction without residual deficits: Secondary | ICD-10-CM

## 2023-09-14 DIAGNOSIS — J150 Pneumonia due to Klebsiella pneumoniae: Secondary | ICD-10-CM | POA: Diagnosis present

## 2023-09-14 DIAGNOSIS — J9602 Acute respiratory failure with hypercapnia: Secondary | ICD-10-CM | POA: Diagnosis not present

## 2023-09-14 DIAGNOSIS — I471 Supraventricular tachycardia, unspecified: Secondary | ICD-10-CM | POA: Diagnosis not present

## 2023-09-14 DIAGNOSIS — I7300 Raynaud's syndrome without gangrene: Secondary | ICD-10-CM | POA: Diagnosis present

## 2023-09-14 DIAGNOSIS — I5023 Acute on chronic systolic (congestive) heart failure: Secondary | ICD-10-CM | POA: Diagnosis present

## 2023-09-14 DIAGNOSIS — I952 Hypotension due to drugs: Secondary | ICD-10-CM | POA: Diagnosis present

## 2023-09-14 DIAGNOSIS — G928 Other toxic encephalopathy: Secondary | ICD-10-CM | POA: Diagnosis present

## 2023-09-14 DIAGNOSIS — Z95810 Presence of automatic (implantable) cardiac defibrillator: Secondary | ICD-10-CM

## 2023-09-14 DIAGNOSIS — R6521 Severe sepsis with septic shock: Secondary | ICD-10-CM | POA: Diagnosis present

## 2023-09-14 DIAGNOSIS — I472 Ventricular tachycardia, unspecified: Secondary | ICD-10-CM | POA: Diagnosis not present

## 2023-09-14 DIAGNOSIS — E669 Obesity, unspecified: Secondary | ICD-10-CM | POA: Diagnosis present

## 2023-09-14 DIAGNOSIS — G210 Malignant neuroleptic syndrome: Secondary | ICD-10-CM | POA: Diagnosis present

## 2023-09-14 DIAGNOSIS — E878 Other disorders of electrolyte and fluid balance, not elsewhere classified: Secondary | ICD-10-CM | POA: Diagnosis present

## 2023-09-14 DIAGNOSIS — J80 Acute respiratory distress syndrome: Secondary | ICD-10-CM | POA: Diagnosis present

## 2023-09-14 DIAGNOSIS — I5020 Unspecified systolic (congestive) heart failure: Secondary | ICD-10-CM

## 2023-09-14 DIAGNOSIS — G9389 Other specified disorders of brain: Secondary | ICD-10-CM | POA: Diagnosis present

## 2023-09-14 DIAGNOSIS — M6282 Rhabdomyolysis: Secondary | ICD-10-CM | POA: Diagnosis present

## 2023-09-14 DIAGNOSIS — J95851 Ventilator associated pneumonia: Secondary | ICD-10-CM | POA: Diagnosis present

## 2023-09-14 DIAGNOSIS — F845 Asperger's syndrome: Secondary | ICD-10-CM | POA: Diagnosis present

## 2023-09-14 DIAGNOSIS — J690 Pneumonitis due to inhalation of food and vomit: Secondary | ICD-10-CM

## 2023-09-14 DIAGNOSIS — F0282 Dementia in other diseases classified elsewhere, unspecified severity, with psychotic disturbance: Secondary | ICD-10-CM | POA: Diagnosis present

## 2023-09-14 DIAGNOSIS — Z930 Tracheostomy status: Secondary | ICD-10-CM | POA: Diagnosis not present

## 2023-09-14 DIAGNOSIS — I214 Non-ST elevation (NSTEMI) myocardial infarction: Secondary | ICD-10-CM

## 2023-09-14 DIAGNOSIS — G4733 Obstructive sleep apnea (adult) (pediatric): Secondary | ICD-10-CM | POA: Diagnosis present

## 2023-09-14 DIAGNOSIS — I82499 Acute embolism and thrombosis of other specified deep vein of unspecified lower extremity: Secondary | ICD-10-CM | POA: Diagnosis not present

## 2023-09-14 DIAGNOSIS — E870 Hyperosmolality and hypernatremia: Secondary | ICD-10-CM | POA: Diagnosis not present

## 2023-09-14 DIAGNOSIS — A419 Sepsis, unspecified organism: Secondary | ICD-10-CM | POA: Diagnosis present

## 2023-09-14 DIAGNOSIS — J9601 Acute respiratory failure with hypoxia: Secondary | ICD-10-CM | POA: Diagnosis present

## 2023-09-14 DIAGNOSIS — T4275XA Adverse effect of unspecified antiepileptic and sedative-hypnotic drugs, initial encounter: Secondary | ICD-10-CM | POA: Diagnosis present

## 2023-09-14 DIAGNOSIS — E875 Hyperkalemia: Secondary | ICD-10-CM | POA: Diagnosis not present

## 2023-09-14 DIAGNOSIS — I2510 Atherosclerotic heart disease of native coronary artery without angina pectoris: Secondary | ICD-10-CM | POA: Diagnosis not present

## 2023-09-14 DIAGNOSIS — Z87820 Personal history of traumatic brain injury: Secondary | ICD-10-CM

## 2023-09-14 DIAGNOSIS — Z9911 Dependence on respirator [ventilator] status: Secondary | ICD-10-CM | POA: Diagnosis not present

## 2023-09-14 DIAGNOSIS — I82A12 Acute embolism and thrombosis of left axillary vein: Secondary | ICD-10-CM | POA: Diagnosis not present

## 2023-09-14 DIAGNOSIS — B9789 Other viral agents as the cause of diseases classified elsewhere: Secondary | ICD-10-CM | POA: Diagnosis present

## 2023-09-14 DIAGNOSIS — Z7982 Long term (current) use of aspirin: Secondary | ICD-10-CM

## 2023-09-14 DIAGNOSIS — Z79899 Other long term (current) drug therapy: Secondary | ICD-10-CM

## 2023-09-14 DIAGNOSIS — F429 Obsessive-compulsive disorder, unspecified: Secondary | ICD-10-CM | POA: Diagnosis present

## 2023-09-14 DIAGNOSIS — Z818 Family history of other mental and behavioral disorders: Secondary | ICD-10-CM

## 2023-09-14 DIAGNOSIS — I110 Hypertensive heart disease with heart failure: Secondary | ICD-10-CM | POA: Diagnosis present

## 2023-09-14 DIAGNOSIS — I82622 Acute embolism and thrombosis of deep veins of left upper extremity: Secondary | ICD-10-CM | POA: Diagnosis not present

## 2023-09-14 DIAGNOSIS — Z888 Allergy status to other drugs, medicaments and biological substances status: Secondary | ICD-10-CM

## 2023-09-14 DIAGNOSIS — R0602 Shortness of breath: Secondary | ICD-10-CM | POA: Diagnosis not present

## 2023-09-14 DIAGNOSIS — J9621 Acute and chronic respiratory failure with hypoxia: Secondary | ICD-10-CM | POA: Diagnosis not present

## 2023-09-14 DIAGNOSIS — Z7901 Long term (current) use of anticoagulants: Secondary | ICD-10-CM

## 2023-09-14 DIAGNOSIS — I255 Ischemic cardiomyopathy: Secondary | ICD-10-CM | POA: Diagnosis present

## 2023-09-14 DIAGNOSIS — I21A1 Myocardial infarction type 2: Secondary | ICD-10-CM | POA: Diagnosis present

## 2023-09-14 DIAGNOSIS — G3109 Other frontotemporal dementia: Secondary | ICD-10-CM | POA: Diagnosis present

## 2023-09-14 DIAGNOSIS — J810 Acute pulmonary edema: Secondary | ICD-10-CM | POA: Diagnosis not present

## 2023-09-14 DIAGNOSIS — N401 Enlarged prostate with lower urinary tract symptoms: Secondary | ICD-10-CM | POA: Diagnosis present

## 2023-09-14 DIAGNOSIS — R570 Cardiogenic shock: Secondary | ICD-10-CM | POA: Diagnosis present

## 2023-09-14 DIAGNOSIS — H9193 Unspecified hearing loss, bilateral: Secondary | ICD-10-CM | POA: Diagnosis present

## 2023-09-14 DIAGNOSIS — E876 Hypokalemia: Secondary | ICD-10-CM | POA: Diagnosis not present

## 2023-09-14 DIAGNOSIS — E782 Mixed hyperlipidemia: Secondary | ICD-10-CM | POA: Diagnosis present

## 2023-09-14 DIAGNOSIS — Y848 Other medical procedures as the cause of abnormal reaction of the patient, or of later complication, without mention of misadventure at the time of the procedure: Secondary | ICD-10-CM | POA: Diagnosis present

## 2023-09-14 DIAGNOSIS — I252 Old myocardial infarction: Secondary | ICD-10-CM

## 2023-09-14 DIAGNOSIS — J189 Pneumonia, unspecified organism: Secondary | ICD-10-CM | POA: Diagnosis not present

## 2023-09-14 DIAGNOSIS — K219 Gastro-esophageal reflux disease without esophagitis: Secondary | ICD-10-CM | POA: Diagnosis present

## 2023-09-14 DIAGNOSIS — R079 Chest pain, unspecified: Secondary | ICD-10-CM | POA: Diagnosis not present

## 2023-09-14 DIAGNOSIS — Z7902 Long term (current) use of antithrombotics/antiplatelets: Secondary | ICD-10-CM

## 2023-09-14 DIAGNOSIS — Z955 Presence of coronary angioplasty implant and graft: Secondary | ICD-10-CM

## 2023-09-14 DIAGNOSIS — Z6832 Body mass index (BMI) 32.0-32.9, adult: Secondary | ICD-10-CM

## 2023-09-14 DIAGNOSIS — R7989 Other specified abnormal findings of blood chemistry: Secondary | ICD-10-CM | POA: Diagnosis not present

## 2023-09-14 DIAGNOSIS — L309 Dermatitis, unspecified: Secondary | ICD-10-CM | POA: Diagnosis not present

## 2023-09-14 DIAGNOSIS — E861 Hypovolemia: Secondary | ICD-10-CM | POA: Diagnosis present

## 2023-09-14 DIAGNOSIS — I5021 Acute systolic (congestive) heart failure: Secondary | ICD-10-CM | POA: Diagnosis not present

## 2023-09-14 DIAGNOSIS — R579 Shock, unspecified: Secondary | ICD-10-CM | POA: Diagnosis not present

## 2023-09-14 DIAGNOSIS — S06301A Unspecified focal traumatic brain injury with loss of consciousness of 30 minutes or less, initial encounter: Secondary | ICD-10-CM | POA: Diagnosis not present

## 2023-09-14 DIAGNOSIS — R4182 Altered mental status, unspecified: Secondary | ICD-10-CM

## 2023-09-14 DIAGNOSIS — G9341 Metabolic encephalopathy: Secondary | ICD-10-CM | POA: Diagnosis not present

## 2023-09-14 LAB — BLOOD GAS, ARTERIAL
Acid-base deficit: 0.5 mmol/L (ref 0.0–2.0)
Bicarbonate: 23.1 mmol/L (ref 20.0–28.0)
FIO2: 100 %
MECHVT: 600 mL
Mechanical Rate: 24
O2 Saturation: 99.5 %
PEEP: 10 cmH2O
Patient temperature: 37
pCO2 arterial: 34 mmHg (ref 32–48)
pH, Arterial: 7.44 (ref 7.35–7.45)
pO2, Arterial: 146 mmHg — ABNORMAL HIGH (ref 83–108)

## 2023-09-14 LAB — URINALYSIS, ROUTINE W REFLEX MICROSCOPIC
Bilirubin Urine: NEGATIVE
Glucose, UA: NEGATIVE mg/dL
Hgb urine dipstick: NEGATIVE
Ketones, ur: 20 mg/dL — AB
Leukocytes,Ua: NEGATIVE
Nitrite: NEGATIVE
Protein, ur: NEGATIVE mg/dL
Specific Gravity, Urine: 1.023 (ref 1.005–1.030)
pH: 6 (ref 5.0–8.0)

## 2023-09-14 LAB — TROPONIN I (HIGH SENSITIVITY): Troponin I (High Sensitivity): 106 ng/L (ref <18)

## 2023-09-14 LAB — CK: Total CK: 136 U/L (ref 49–397)

## 2023-09-14 LAB — CBC WITH DIFFERENTIAL/PLATELET
Abs Immature Granulocytes: 0.05 10*3/uL (ref 0.00–0.07)
Basophils Absolute: 0 10*3/uL (ref 0.0–0.1)
Basophils Relative: 0 %
Eosinophils Absolute: 0.1 10*3/uL (ref 0.0–0.5)
Eosinophils Relative: 1 %
HCT: 45 % (ref 39.0–52.0)
Hemoglobin: 15.6 g/dL (ref 13.0–17.0)
Immature Granulocytes: 1 %
Lymphocytes Relative: 8 %
Lymphs Abs: 0.7 10*3/uL (ref 0.7–4.0)
MCH: 31.8 pg (ref 26.0–34.0)
MCHC: 34.7 g/dL (ref 30.0–36.0)
MCV: 91.8 fL (ref 80.0–100.0)
Monocytes Absolute: 0.9 10*3/uL (ref 0.1–1.0)
Monocytes Relative: 10 %
Neutro Abs: 7.5 10*3/uL (ref 1.7–7.7)
Neutrophils Relative %: 80 %
Platelets: 222 10*3/uL (ref 150–400)
RBC: 4.9 MIL/uL (ref 4.22–5.81)
RDW: 13.6 % (ref 11.5–15.5)
WBC: 9.3 10*3/uL (ref 4.0–10.5)
nRBC: 0 % (ref 0.0–0.2)

## 2023-09-14 LAB — VALPROIC ACID LEVEL: Valproic Acid Lvl: 49 ug/mL — ABNORMAL LOW (ref 50–100)

## 2023-09-14 LAB — COMPREHENSIVE METABOLIC PANEL WITH GFR
ALT: 24 U/L (ref 0–44)
AST: 25 U/L (ref 15–41)
Albumin: 4 g/dL (ref 3.5–5.0)
Alkaline Phosphatase: 55 U/L (ref 38–126)
Anion gap: 12 (ref 5–15)
BUN: 15 mg/dL (ref 8–23)
CO2: 21 mmol/L — ABNORMAL LOW (ref 22–32)
Calcium: 8.9 mg/dL (ref 8.9–10.3)
Chloride: 105 mmol/L (ref 98–111)
Creatinine, Ser: 1.03 mg/dL (ref 0.61–1.24)
GFR, Estimated: >60 mL/min (ref >60)
Glucose, Bld: 127 mg/dL — ABNORMAL HIGH (ref 70–99)
Potassium: 3.9 mmol/L (ref 3.5–5.1)
Sodium: 138 mmol/L (ref 135–145)
Total Bilirubin: 0.9 mg/dL (ref 0.0–1.2)
Total Protein: 7.2 g/dL (ref 6.5–8.1)

## 2023-09-14 LAB — GLUCOSE, CAPILLARY
Glucose-Capillary: 104 mg/dL — ABNORMAL HIGH (ref 70–99)
Glucose-Capillary: 134 mg/dL — ABNORMAL HIGH (ref 70–99)

## 2023-09-14 LAB — LACTIC ACID, PLASMA
Lactic Acid, Venous: 1.5 mmol/L (ref 0.5–1.9)
Lactic Acid, Venous: 2.6 mmol/L (ref 0.5–1.9)

## 2023-09-14 LAB — BRAIN NATRIURETIC PEPTIDE: B Natriuretic Peptide: 573.5 pg/mL — ABNORMAL HIGH (ref 0.0–100.0)

## 2023-09-14 LAB — MAGNESIUM: Magnesium: 1.8 mg/dL (ref 1.7–2.4)

## 2023-09-14 LAB — BLOOD GAS, VENOUS

## 2023-09-14 MED ORDER — IPRATROPIUM-ALBUTEROL 0.5-2.5 (3) MG/3ML IN SOLN: 3.0000 mL | Freq: Four times a day (QID) | RESPIRATORY_TRACT | Status: DC | PRN

## 2023-09-14 MED ORDER — ETOMIDATE 2 MG/ML IV SOLN: 20.0000 mg | Freq: Once | INTRAVENOUS | Status: AC

## 2023-09-14 MED ORDER — FENTANYL 2500MCG IN NS 250ML (10MCG/ML) PREMIX INFUSION
0.0000 ug/h | INTRAVENOUS | Status: DC
  Administered 2023-09-14: 25 ug/h via INTRAVENOUS
  Administered 2023-09-15: 75 ug/h via INTRAVENOUS
  Filled 2023-09-14 (×2): qty 250

## 2023-09-14 MED ORDER — LORAZEPAM 2 MG/ML IJ SOLN
1.0000 mg | Freq: Once | INTRAMUSCULAR | Status: AC | PRN
  Administered 2023-09-14: 1 mg via INTRAVENOUS
  Filled 2023-09-14: qty 1

## 2023-09-14 MED ORDER — MIDAZOLAM HCL 2 MG/2ML IJ SOLN
2.0000 mg | Freq: Once | INTRAMUSCULAR | Status: AC | PRN
  Administered 2023-09-15: 2 mg via INTRAVENOUS
  Filled 2023-09-14: qty 2

## 2023-09-14 MED ORDER — LORAZEPAM 2 MG/ML IJ SOLN
INTRAMUSCULAR | Status: AC
  Filled 2023-09-14: qty 1

## 2023-09-14 MED ORDER — LACTATED RINGERS IV BOLUS
1000.0000 mL | Freq: Once | INTRAVENOUS | Status: AC
  Administered 2023-09-14: 1000 mL via INTRAVENOUS

## 2023-09-14 MED ORDER — ROCURONIUM BROMIDE 10 MG/ML (PF) SYRINGE: 100.0000 mg | PREFILLED_SYRINGE | Freq: Once | INTRAVENOUS | Status: AC

## 2023-09-14 MED ORDER — ACETAMINOPHEN 500 MG PO TABS
1000.0000 mg | ORAL_TABLET | Freq: Once | ORAL | Status: DC
  Filled 2023-09-14: qty 2

## 2023-09-14 MED ORDER — LORAZEPAM 2 MG/ML IJ SOLN
2.0000 mg | Freq: Once | INTRAMUSCULAR | Status: AC
  Administered 2023-09-14: 2 mg via INTRAVENOUS

## 2023-09-14 MED ORDER — ORAL CARE MOUTH RINSE: 15.0000 mL | OROMUCOSAL | Status: DC | PRN

## 2023-09-14 MED ORDER — FENTANYL BOLUS VIA INFUSION
25.0000 ug | INTRAVENOUS | Status: DC | PRN
  Administered 2023-09-14: 50 ug via INTRAVENOUS
  Administered 2023-09-15: 100 ug via INTRAVENOUS

## 2023-09-14 MED ORDER — ETOMIDATE 2 MG/ML IV SOLN
INTRAVENOUS | Status: AC
  Administered 2023-09-14: 20 mg via INTRAVENOUS
  Filled 2023-09-14: qty 10

## 2023-09-14 MED ORDER — PROPOFOL 1000 MG/100ML IV EMUL
0.0000 ug/kg/min | INTRAVENOUS | Status: DC
  Administered 2023-09-14 – 2023-09-15 (×2): 10 ug/kg/min via INTRAVENOUS
  Administered 2023-09-15 – 2023-09-16 (×3): 25 ug/kg/min via INTRAVENOUS
  Filled 2023-09-14 (×5): qty 100

## 2023-09-14 MED ORDER — CHLORHEXIDINE GLUCONATE CLOTH 2 % EX PADS
6.0000 | MEDICATED_PAD | Freq: Every day | CUTANEOUS | Status: DC
  Administered 2023-09-14 – 2023-09-29 (×15): 6 via TOPICAL
  Filled 2023-09-14: qty 6

## 2023-09-14 MED ORDER — DOCUSATE SODIUM 50 MG/5ML PO LIQD
100.0000 mg | Freq: Two times a day (BID) | ORAL | Status: DC
Start: 2023-09-14 — End: 2023-09-17
  Administered 2023-09-14 – 2023-09-16 (×4): 100 mg
  Filled 2023-09-14 (×5): qty 10

## 2023-09-14 MED ORDER — ROCURONIUM BROMIDE 10 MG/ML (PF) SYRINGE
PREFILLED_SYRINGE | INTRAVENOUS | Status: AC
  Administered 2023-09-14: 100 mg via INTRAVENOUS
  Filled 2023-09-14: qty 10

## 2023-09-14 MED ORDER — POLYETHYLENE GLYCOL 3350 17 G PO PACK: 17.0000 g | PACK | Freq: Every day | ORAL | Status: DC | PRN

## 2023-09-14 MED ORDER — DOCUSATE SODIUM 100 MG PO CAPS: 100.0000 mg | ORAL_CAPSULE | Freq: Two times a day (BID) | ORAL | Status: DC | PRN

## 2023-09-14 MED ORDER — LORAZEPAM 2 MG/ML IJ SOLN
2.0000 mg | Freq: Once | INTRAMUSCULAR | Status: DC
  Filled 2023-09-14: qty 1

## 2023-09-14 MED ORDER — VANCOMYCIN HCL 2000 MG/400ML IV SOLN
2000.0000 mg | Freq: Once | INTRAVENOUS | Status: AC
  Administered 2023-09-15: 2000 mg via INTRAVENOUS
  Filled 2023-09-14 (×2): qty 400

## 2023-09-14 MED ORDER — ACETAMINOPHEN 10 MG/ML IV SOLN
1000.0000 mg | Freq: Four times a day (QID) | INTRAVENOUS | Status: AC
  Administered 2023-09-14 – 2023-09-15 (×4): 1000 mg via INTRAVENOUS
  Filled 2023-09-14 (×4): qty 100

## 2023-09-14 MED ORDER — METOPROLOL TARTRATE 5 MG/5ML IV SOLN
5.0000 mg | Freq: Once | INTRAVENOUS | Status: AC
  Administered 2023-09-14: 5 mg via INTRAVENOUS
  Filled 2023-09-14: qty 5

## 2023-09-14 MED ORDER — ORAL CARE MOUTH RINSE
15.0000 mL | OROMUCOSAL | Status: DC
  Administered 2023-09-15 – 2023-09-29 (×176): 15 mL via OROMUCOSAL

## 2023-09-14 MED ORDER — SODIUM CHLORIDE 0.9 % IV SOLN
2.0000 g | Freq: Once | INTRAVENOUS | Status: AC
  Administered 2023-09-14: 2 g via INTRAVENOUS
  Filled 2023-09-14: qty 2000

## 2023-09-14 MED ORDER — SODIUM CHLORIDE 0.9 % IV BOLUS
500.0000 mL | Freq: Once | INTRAVENOUS | Status: AC
  Administered 2023-09-14: 500 mL via INTRAVENOUS

## 2023-09-14 MED ORDER — IPRATROPIUM-ALBUTEROL 0.5-2.5 (3) MG/3ML IN SOLN
3.0000 mL | Freq: Four times a day (QID) | RESPIRATORY_TRACT | Status: DC
  Administered 2023-09-15 – 2023-09-20 (×24): 3 mL via RESPIRATORY_TRACT
  Filled 2023-09-14 (×24): qty 3

## 2023-09-14 MED ORDER — LORAZEPAM 2 MG/ML IJ SOLN
1.0000 mg | Freq: Once | INTRAMUSCULAR | Status: AC
  Administered 2023-09-14: 1 mg via INTRAVENOUS
  Filled 2023-09-14: qty 1

## 2023-09-14 MED ORDER — IOHEXOL 350 MG/ML SOLN
100.0000 mL | Freq: Once | INTRAVENOUS | Status: AC | PRN
Start: 2023-09-14 — End: 2023-09-14
  Administered 2023-09-14: 100 mL via INTRAVENOUS

## 2023-09-14 MED ORDER — SODIUM CHLORIDE 0.9 % IV SOLN
2.0000 g | Freq: Once | INTRAVENOUS | Status: AC
  Administered 2023-09-14: 2 g via INTRAVENOUS
  Filled 2023-09-14: qty 20

## 2023-09-14 MED ORDER — DEXTROSE 5 % IV SOLN
10.0000 mg/kg | Freq: Once | INTRAVENOUS | Status: AC
  Administered 2023-09-15: 895 mg via INTRAVENOUS
  Filled 2023-09-14: qty 17.9

## 2023-09-14 MED ORDER — VANCOMYCIN HCL IN DEXTROSE 1-5 GM/200ML-% IV SOLN: 1000.0000 mg | Freq: Once | INTRAVENOUS | Status: DC

## 2023-09-14 MED ORDER — NOREPINEPHRINE 4 MG/250ML-% IV SOLN
0.0000 ug/min | INTRAVENOUS | Status: DC
  Administered 2023-09-15: 14 ug/min via INTRAVENOUS
  Administered 2023-09-15: 2 ug/min via INTRAVENOUS
  Filled 2023-09-14 (×2): qty 250

## 2023-09-14 MED ORDER — POLYETHYLENE GLYCOL 3350 17 G PO PACK
17.0000 g | PACK | Freq: Every day | ORAL | Status: DC
  Administered 2023-09-15 – 2023-09-16 (×2): 17 g
  Filled 2023-09-14 (×2): qty 1

## 2023-09-14 MED ORDER — MIDAZOLAM HCL 2 MG/2ML IJ SOLN
1.0000 mg | INTRAMUSCULAR | Status: DC | PRN
  Administered 2023-09-14: 1 mg via INTRAVENOUS
  Administered 2023-09-15 (×2): 2 mg via INTRAVENOUS
  Filled 2023-09-14 (×3): qty 2

## 2023-09-14 MED ORDER — FAMOTIDINE 20 MG PO TABS
20.0000 mg | ORAL_TABLET | Freq: Two times a day (BID) | ORAL | Status: DC
  Administered 2023-09-14: 20 mg
  Filled 2023-09-14: qty 1

## 2023-09-14 NOTE — Progress Notes (Signed)
 eLink Physician-Brief Progress Note Patient Name: Blake [name] DOB: 04-30-1950 MRN: [id number]   Date of Service  09/14/2023  HPI/Events of Note  eICU Brief new admit note: 73 y.o. male with history of HTN, CAD-DES stent,  CHF , BPH, TBI-cognitive deficits, who comes in with shortness of breath , cough for 2 days , confused and chest pain. Tachycardic , AMS in ED, not responding to ativan.  Intubated and now in ICU.  Data: EKG sinus tachycardia, T down in V6, lead 3. Looks like type 2 rise in troponin Elevated LA. 2.6 up from 1.5 , normal wbc. UA neg. CxR : images seen:  Ph 7.49. hco3 at 28 Cr 1 LFT normal BNP 573, trop 106  CTH neg CTangio chest, abdomen: 1. No evidence of pulmonary embolus. 2. Dense bilateral perihilar airspace disease, greatest in the lower lobes, which could reflect widespread infection or edema. 3. Trace bilateral pleural effusions. 4. Aortic Atherosclerosis (ICD10-I70.0). Coronary artery atherosclerosis. 5. 4.1 cm ascending thoracic aortic aneurysm. Recommend annual imaging followup by CTA or MRA.   Diverticulosis, prostate enlarged.   Was covid positive in 2023.   Camera: On Ventilator. Just rolled in. 600(< 8 ml/ibw)/10/100%. Sats 98% MAP 71.HR 105. On fenta, propofol  sedation PIP 22.febrile Temp 101. In synchrony with vent.   A:  Sepsis: AHRF on Vent:  looks like aspiration pneumonitis, CHF exacerbation. Febrile now in ICU. R/o viral causes-getting Respiratory pathogen panel. MRSA swab. Lung protective ventilation. VAP bundle.   Encephalopathy mostly from sepsis. Received meningitis coverage (vanc,  acyclovir, rocephin, ampicillin from ED).   Type 2 rise in troponin.Aaron Aas trend troponin.      eICU Interventions       Intervention Category Major Interventions: Respiratory failure - evaluation and management Evaluation Type: New Patient Evaluation  Rexann Catalan 09/14/2023, 10:39 PM

## 2023-09-14 NOTE — Sepsis Progress Note (Signed)
 Elink monitoring for the code sepsis protocol.

## 2023-09-14 NOTE — ED Notes (Addendum)
 Pt agitated, combative, diaphoretic, tachypneic, with oxygen saturation dropping in 70's with non-reBreather in place. EDP and respiratory called to initiate intubation due to pt not tolerating breathing apparatuses as well as declining respiratory.

## 2023-09-14 NOTE — Sepsis Progress Note (Signed)
Notified bedside nurse of need to order repeat lactic acid.  

## 2023-09-14 NOTE — ED Notes (Addendum)
 IV tylenol  given at 2056. Temp at 2127 was 103.7 F and NP Ouma was notified and aware of pt temperature. NP Ouma stated "it will take time for the fever to come down keep him cool" and "we do not want to aggresively lower his temp ".

## 2023-09-14 NOTE — Progress Notes (Signed)
 Patient intubated by MD with 7.5 ET tubed secured at 26 @ lip.  Equal BBS and positive CO2 color change.  Patient placed on vent on following settings.  PRVC 600 / 24 / 100% +5

## 2023-09-14 NOTE — Consult Note (Signed)
 CODE SEPSIS - PHARMACY COMMUNICATION  **Broad-spectrum antimicrobials should be administered within one hour of sepsis diagnosis**  Time Code Sepsis call or page was received: 2033  Antibiotics ordered: Ampicillin, Ceftriaxone, Vancomycin, Acyclovir  Time of first antibiotic administration: 2058  Additional action taken by pharmacy: N/A  If necessary, name of provider/nurse contacted: N/A    Will M. Alva Jewels, PharmD Clinical Pharmacist 09/14/2023 9:07 PM

## 2023-09-14 NOTE — Consult Note (Signed)
 Pharmacy Consult Note - Electrolytes  Sodium (mmol/L)  Date Value  09/14/2023 138  05/23/2014 143   Potassium (mmol/L)  Date Value  09/14/2023 3.9  05/23/2014 4.1   Magnesium  (mg/dL)  Date Value  40/01/2724 1.8  09/13/2013 1.9   Calcium  (mg/dL)  Date Value  36/64/4034 8.9   Calcium , Total (mg/dL)  Date Value  74/25/9563 8.5   Albumin (g/dL)  Date Value  87/56/4332 4.0  05/23/2014 3.7   Phosphorus (mg/dL)  Date Value  95/18/8416 2.3 (L)    ASSESSMENT: 73 y.o. male with PMH including CHF, CAD who presents with altered mental status and hypoxic respiratory failure. Pharmacy has been consulted to monitor and replace electrolytes. Patient's renal function is currently baseline.  Lab Results  Component Value Date   CREATININE 1.03 09/14/2023   CREATININE 1.10 08/15/2022   CREATININE 0.98 01/16/2022    mIVF: N/A  Pertinent medications: N/A  Goal of Therapy:  Electrolytes WNL  PLAN: No electrolyte replacement currently warranted Check electrolytes with next AM labs   Thank you for allowing pharmacy to be a part of this patient's care.  Will M. Alva Jewels, PharmD Clinical Pharmacist 09/14/2023 9:27 PM

## 2023-09-14 NOTE — Progress Notes (Signed)
 Patient unable to maintain O2 sats.  Patient taken on ventilator and BMV used with PEEP valve set at 10.  Once sats increased into the 90's patient placed back on vent on previous settings with PEEP increased to 10.  O2 sats 94% at this time and patient tolerating well.

## 2023-09-14 NOTE — ED Provider Notes (Signed)
 [facility] Provider Note    Event Date/Time   First MD Initiated Contact with Patient 09/14/23 1811     (approximate)   History   Shortness of Breath and Chest Pain   HPI  Blake [name] is a 73 y.o. male with history of CHF who comes in with shortness of breath and chest pain.  Patient's had 1 to 2 days of cough chills body aches.  He was seen today at internal medicine and started on cefdinir, promethazine he had a viral panel, strep panel that were negative.  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  He is on Depakote , duloxetine .   Patient keeps stating that he just wants to leave.  He is agitated and try to get out of bed.  Unable to get full HPI due to a patient's altered mental status    Physical Exam   Triage Vital Signs: ED Triage Vitals [09/14/23 1806]  Encounter Vitals Group     BP (!) 155/112     Systolic BP Percentile      Diastolic BP Percentile      Pulse Rate (!) 145     Resp (!) 28     Temp 99 F (37.2 C)     Temp Source Oral     SpO2 90 %     Weight      Height      Head Circumference      Peak Flow      Pain Score 0     Pain Loc      Pain Education      Exclude from Growth Chart     Most recent vital signs: Vitals:   09/14/23 1806  BP: (!) 155/112  Pulse: (!) 145  Resp: (!) 28  Temp: 99 F (37.2 C)  SpO2: 90%     General: Patient appears confused, agitated trying to get out of bed CV:  Good peripheral perfusion.  ICD in place Resp:  Normal effort.  Tachycardic Abd:  No distention.  Soft and nontender Other:  No swelling legs, no calf tenderness Left pupil is pinpoint the right pupil is irregular which is baseline according to the wife he has had prior surgery -equal strength in  his arms and legs, no cranial nerve deficits  ED Results / Procedures / Treatments   Labs (all labs ordered are listed, but only abnormal results are displayed) Labs Reviewed  COMPREHENSIVE METABOLIC PANEL WITH GFR - Abnormal; Notable for the following components:      Result Value   CO2 21 (*)    Glucose, Bld 127 (*)    All other components within normal limits  BRAIN NATRIURETIC PEPTIDE - Abnormal; Notable for the following components:   B Natriuretic Peptide 573.5 (*)    All other components within normal limits  URINALYSIS, ROUTINE W REFLEX MICROSCOPIC - Abnormal; Notable for the following components:   Color, Urine YELLOW (*)    APPearance HAZY (*)    Ketones, ur 20 (*)    All other components within normal limits  VALPROIC ACID  LEVEL - Abnormal; Notable for the following components:   Valproic Acid  Lvl 49 (*)    All other components within normal limits  BLOOD GAS, VENOUS -  Abnormal; Notable for the following components:   pH, Ven 7.49 (*)    pCO2, Ven 37 (*)    Bicarbonate 28.2 (*)    Acid-Base Excess 4.7 (*)    All other components within normal limits  TROPONIN I (HIGH SENSITIVITY) - Abnormal; Notable for the following components:   Troponin I (High Sensitivity) 106 (*)    All other components within normal limits  CULTURE, BLOOD (ROUTINE X 2)  CULTURE, BLOOD (ROUTINE X 2)  URINE CULTURE  MRSA NEXT GEN BY PCR, NASAL  RESPIRATORY PANEL BY PCR  CBC WITH DIFFERENTIAL/PLATELET  LACTIC ACID, PLASMA  CK  MAGNESIUM   LACTIC ACID, PLASMA  LEGIONELLA PNEUMOPHILA SEROGP 1 UR AG  STREP PNEUMONIAE URINARY ANTIGEN  BASIC METABOLIC PANEL WITH GFR  MAGNESIUM   PHOSPHORUS  TROPONIN I (HIGH SENSITIVITY)     EKG  My interpretation of EKG:  Sinus tachycardia rate of 144 without any ST elevation or T wave inversions except for V6 and lead [name], normal intervals  RADIOLOGY I have reviewed the xray personally and interpreted some possible congestion bilaterally,  defibrillator noted   PROCEDURES:  Critical Care performed: Yes, see critical care procedure note(s)  .Critical Care  Performed by: Lubertha Rush, MD Authorized by: Lubertha Rush, MD   Critical care provider statement:    Critical care time (minutes):  80   Critical care was necessary to treat or prevent imminent or life-threatening deterioration of the following conditions:  Sepsis   Critical care was time spent personally by me on the following activities:  Development of treatment plan with patient or surrogate, discussions with consultants, evaluation of patient's response to treatment, examination of patient, ordering and review of laboratory studies, ordering and review of radiographic studies, ordering and performing treatments and interventions, pulse oximetry, re-evaluation of patient's condition and review of old charts .1-3 Lead EKG Interpretation  Performed by: Lubertha Rush, MD Authorized by: Lubertha Rush, MD     Interpretation: abnormal     ECG rate:  130   ECG rate assessment: tachycardic     Rhythm: sinus tachycardia     Ectopy: none     Conduction: normal      MEDICATIONS ORDERED IN ED: Medications  iohexol  (OMNIPAQUE ) 350 MG/ML injection 100 mL (has no administration in time range)  LORazepam (ATIVAN) 2 MG/ML injection (  Not Given 09/14/23 2101)  LORazepam (ATIVAN) injection 2 mg (2 mg Intravenous Not Given 09/14/23 2101)  LORazepam (ATIVAN) 2 MG/ML injection (  Not Given 09/14/23 2100)  propofol  (DIPRIVAN ) 1000 MG/100ML infusion (25 mcg/kg/min  89.4 kg Intravenous Rate/Dose Change 09/14/23 2140)  acetaminophen  (OFIRMEV ) IV 1,000 mg (0 mg Intravenous Stopped 09/14/23 2136)  cefTRIAXone (ROCEPHIN) 2 g in sodium chloride  0.9 % 100 mL IVPB (has no administration in time range)  acyclovir (ZOVIRAX) 895 mg in dextrose  5 % 250 mL IVPB (has no administration in time range)  midazolam  (VERSED ) injection 2 mg (has no administration in time range)  vancomycin (VANCOREADY)  IVPB 2000 mg/400 mL (has no administration in time range)  polyethylene glycol (MIRALAX / GLYCOLAX) packet 17 g (has no administration in time range)  famotidine  (PEPCID ) tablet 20 mg (has no administration in time range)  ipratropium-albuterol (DUONEB) 0.5-2.5 (3) MG/3ML nebulizer solution 3 mL (has no administration in time range)  ipratropium-albuterol (DUONEB) 0.5-2.5 (3) MG/3ML nebulizer solution 3 mL (has no administration in time range)  docusate (COLACE) 50 MG/5ML liquid 100 mg (has no administration in time range)  Chlorhexidine   Gluconate Cloth 2 % PADS 6 each (has no administration in time range)  LORazepam (ATIVAN) injection 1 mg (1 mg Intravenous Given 09/14/23 1831)  lactated ringers  bolus 1,000 mL (0 mLs Intravenous Stopped 09/14/23 1953)  LORazepam (ATIVAN) injection 1 mg (1 mg Intravenous Given 09/14/23 1952)  LORazepam (ATIVAN) injection 2 mg (2 mg Intravenous Given 09/14/23 2008)  etomidate (AMIDATE) injection 20 mg (20 mg Intravenous Given 09/14/23 2025)  rocuronium  (ZEMURON ) injection 100 mg (100 mg Intravenous Given 09/14/23 2025)  ampicillin (OMNIPEN) 2 g in sodium chloride  0.9 % 100 mL IVPB (0 g Intravenous Stopped 09/14/23 2136)  metoprolol tartrate (LOPRESSOR) injection 5 mg (5 mg Intravenous Given 09/14/23 2050)     IMPRESSION / MDM / ASSESSMENT AND PLAN / ED COURSE  I reviewed the triage vital signs and the nursing notes.   Patient's presentation is most consistent with acute presentation with potential threat to life or bodily function.   Patient presented agitated confused with tachycardia.  He did have a low-grade temperature and on recheck it was febrile.  Given patient had an acute change after having promethazine I question if this could be neuroleptic malignant syndrome.  And I initially treated patient with IV Ativan given pt agitation.  After 1 mg of IV Ativan patient seemed to relax but still remains significantly tachycardic with HR in the 140s.  He also was  satting around 88% so I placed patient on 2 L of oxygen.  When patient was awake although he was agitated he had a nonfocal neurological examination he was moving all extremities had no cranial nerve deficits.  He does have an irregular pupil but that is baseline per the wife.  She does report that he has had agitation in the past it has been from a stroke but given his complex presentation this does not seem is consistent with stroke and I do not feel the patient would be a TNK candidate given nonfocal examination and just the agitation wife agreed and we are going to hold off on stroke code and focus on patient's agitation, fever, hypoxia.  Given his tachycardia concerns for sepsis unknown source, I did trial a fluid bolus but when x-ray was obtained that showed some edema this was stopped.  Patient got about 600 cc of fluid prior to xray read.  Initially I thought that this could be viral in nature given his white count was normal.    I placed tylenol  orders.   Patient then got acutely more agitated and was trying to come out of the bed.  His heart rates then went up to the 170s to 180s.  2 mg of Ativan was trialed but patient did not calm down.  Therefore made the decision to intubate patient for airway protection due to agitation.  After intubation patient was placed on broad-spectrum antibiotics due to unclear source for possible infection and cover broadly to include treatment for meningitis.  Did not give Decadron  as I did not want to increase patient's agitation and it seemed more likely respiratory in nature given his symptoms earlier today of respiratory symptoms.   His troponin came back elevated but I suspect this is more likely demand.  His lactate was increasing but I am going to hold off on further fluids due to concern for CHF.  Postintubation his x-ray showed stable patchiness so does not look like it got worse after the fluids but I am hesitant to give additional fluids given these  findings.  I feel like  CT imaging would be better to evaluate the lungs to see what is going on before additional fluid resuscitation.  Also going to get a CT head to evaluate for intercranial hemorrhage and CT abdomen to evaluate for other causes for infection.  I discussed the case with the ICU team.  Initially after intubation patient sats were 85% and we used a Peep valve and was able to bag him up and replaced him on the ventilator with sats that were more reassuring in the 90s.  His heart rates were in the 180s and patient was given 5 of metoprolol.  And heart rate did come down.  Patient was on propofol  for sedation.  There was some concerns that he moved a little bit and I did put in for a dose of IV Versed  for patient to get prior to going to the ICU.    The patient is on the cardiac monitor to evaluate for evidence of arrhythmia and/or significant heart rate changes.      FINAL CLINICAL IMPRESSION(S) / ED DIAGNOSES   Final diagnoses:  Sepsis, due to unspecified organism, unspecified whether acute organ dysfunction present (HCC)  Altered mental status, unspecified altered mental status type     Rx / DC Orders   ED Discharge Orders     None        Note:  This document was prepared using Dragon voice recognition software and may include unintentional dictation errors.   Lubertha Rush, MD 09/14/23 2214

## 2023-09-14 NOTE — ED Triage Notes (Signed)
 Pt with hx of CHF, presents with dyspnea and CP. Pt seen at urgent care for cold sx and was given cefdinir and cough medicine for possible sinus infection.  Per wife, pt took a nap after urgent care and woke up altered and delusional and c/o CP and SOB.

## 2023-09-14 NOTE — ED Notes (Signed)
 Patient transported to CT

## 2023-09-14 NOTE — H&P (Incomplete)
 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  Per ed reports, and patient's wife who is at the bedside, Patient was seen at the urgent care for chief complaints of chills, body aches, intermittently and nonproductive coughing fits. He was diagnosed with acute upper respiratory infection and was sent home with promethazine-dextromethorphan and cefdinir (OMNICEF). [name] report that he took the prescription and went to bed. Later he woke up altered and delusional and c/o CP and SOB. He was noted to be sweating profusely with abnormal body temp per wife.   ED Course: Initial vital signs showed HR >180 beats/minute, BP >160 mm Hg, the RR 30s-40s breaths/minute, and the oxygen saturation 80s% on NRB and a temperature of 103.29F (39.6C).   Pertinent Labs/Diagnostics Findings: Na+/ K+:138/3.9  Glucose:127 CO2 21 WBC: unremarkable Lactic acid: 1.5~2.6 CK: COVID PCR: Negative troponin:106~7453   BNP:573.5  VBG: pO2 pend; pCO2 37; pH 7.49;  HCO3 28.2, %O2 Sat 43.7.  CXR> CTH> CTA Chest> CT Abd/pelvis>see report  Patient's agitation and restlessness worsen with HR up in the 170s to 180s.  2 mg of Ativan  was trialed but patient did not calm down. Given worsening symptoms and high risk for decompensation, patient intubated for airway protection. He was started on broad-spectrum antibiotics Ampicillin, vanc, Ceftriaxone and Acyclovir due to unclear source for possible infection and cover broadly to include treatment for meningitis. PCCM consulted for admission.  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 resulting Longstanding cognitive deficits, poor social inhibition, apathy, Severe TBI (thrown into a field by a horse and fell off, then he got back onto the horse and fell again on asphalt), HFrEF, ischemic cardiomyopathy, Hyperlipidemia, SVT, CAD, Depression, GERD, Hearing loss of both ears, MI, CVA, Hypertension, Hypotestosteronemia, ICD in place [facility] [employer] [id number] DEFIBRILLATOR, RESONATE EL DR DF4 S/N: [id number] implanted 11/21/2021), and OSA on CPAP   Significant Hospital Events   5/26: Admitted to ICU with acute altered mental status and respiratory failure in the setting of suspected NMS versus sepsis requiring intubation  Consults:  Cardiology Neurology  Procedures:  5/27: Right internal jugular Central Line 5/27: Right radial Art Line  Significant Diagnostic Tests:  09/14/23: Chest Xray> IMPRESSION: Increasing vascular congestion and interstitial changes, possible edema. Defibrillator.   09/14/23: Noncontrast CT head> IMPRESSION: No acute intracranial abnormality.  09/14/23: CTA Chest, abdomen and pelvis> IMPRESSION: Chest:   1. No evidence of pulmonary embolus. 2. Dense bilateral perihilar airspace disease, greatest in the lower lobes, which could reflect widespread infection or edema. 3. Trace bilateral pleural effusions. 4. Aortic Atherosclerosis (ICD10-I70.0). Coronary artery atherosclerosis. 5. 4.1 cm ascending thoracic aortic aneurysm. Recommend annual imaging followup by CTA or MRA. This recommendation follows  2010 ACCF/AHA/AATS/ACR/ASA/SCA/SCAI/SIR/STS/SVM Guidelines for the Diagnosis  and Management of Patients with Thoracic Aortic Disease. Circulation. 2010; 121: Z610-R604. Aortic aneurysm NOS (ICD10-I71.9)   Abdomen/pelvis:   1. No acute intra-abdominal or intrapelvic process. Normal appendix. 2. Distal colonic diverticulosis without diverticulitis. 3. Enlarged prostate. 4.  Aortic Atherosclerosis (ICD10-I70.0). Interim History / Subjective:      Micro Data:  5/26: SARS-CoV-2 PCR> negative 5/26: Influenza PCR> negative 5/26: RVP> 5/26: Blood culture x2> 5/26: MRSA PCR> not detected  5/26: Strep pneumo urinary antigen> 5/26: Legionella urinary antigen>  Antimicrobials:  Vancomycin 5/26> Ceftriaxone 5/26> Acyclovir 5/26> Ampicillin 5/26>  OBJECTIVE   Blood pressure (!) 127/93, pulse (!) 115, temperature (!) 101.7 F (38.7 C), temperature source Oral, resp. rate (!) 24, weight 88 kg, SpO2 100%.    Vent Mode: PRVC FiO2 (%):  [100 %] 100 % Set Rate:  [24 bmp] 24 bmp Vt Set:  [600 mL] 600 mL PEEP:  [5 cmH20-10 cmH20] 5 cmH20 Plateau Pressure:  [23 cmH20] 23 cmH20  No intake or output data in the 24 hours ending 09/14/23 2322 Filed Weights   09/14/23 2029 09/14/23 2300  Weight: 89.4 kg 88 kg     Physical Examination  GENERAL: 73 year-old critically ill patient lying in the bed intubated and sedated EYES: PEERLA. No scleral icterus. Extraocular muscles intact.  HEENT: Head atraumatic, normocephalic. Oropharynx and nasopharynx clear.  NECK:  No JVD, supple  LUNGS: Normal breath sounds bilaterally.  No use of accessory muscles of respiration.  CARDIOVASCULAR: S1, S2 normal. No murmurs, rubs, or gallops.  ABDOMEN: Soft, NTND EXTREMITIES: Diaphoretic with no swelling or erythema.  Capillary refill < 3 seconds in all extremities. Pulses palpable distally. NEUROLOGIC: The patient is intubated and sedated. No focal neurological deficit appreciated. Cranial nerves are intact.   SKIN: No obvious rash, lesion, or ulcer.  Diaphoretic Labs/imaging that I havepersonally reviewed  (right click and "Reselect all SmartList Selections" daily)     Labs   CBC: Recent Labs  Lab 09/14/23 1835  WBC 9.3  NEUTROABS 7.5  HGB 15.6  HCT 45.0  MCV 91.8  PLT 222    Basic Metabolic Panel: Recent Labs  Lab 09/14/23 1835  NA 138  K 3.9  CL 105  CO2 21*  GLUCOSE 127*  BUN 15  CREATININE 1.03  CALCIUM  8.9  MG 1.8   GFR: Estimated Creatinine Clearance: 65.6 mL/min (by C-G formula based on SCr of 1.03 mg/dL). Recent Labs  Lab 09/14/23 1835 09/14/23 2120  WBC 9.3  --   LATICACIDVEN 1.5 2.6*    Liver Function Tests: Recent Labs  Lab 09/14/23 1835  AST 25  ALT 24  ALKPHOS 55  BILITOT 0.9  PROT 7.2  ALBUMIN 4.0   No results for input(s): "LIPASE", "AMYLASE" in the last 168 hours. No results for input(s): "AMMONIA" in the last 168 hours.  ABG    Component Value Date/Time   PHART 7.44 09/14/2023 2247   PCO2ART 34 09/14/2023 2247   PO2ART 146 (H) 09/14/2023 2247   HCO3 23.1 09/14/2023 2247   ACIDBASEDEF 0.5 09/14/2023 2247   O2SAT 99.5 09/14/2023 2247     Coagulation Profile: No results for input(s): "INR", "PROTIME" in the last 168 hours.  Cardiac Enzymes: Recent Labs  Lab 09/14/23 1835  CKTOTAL 136    HbA1C: No results found for: "HGBA1C"  CBG: Recent Labs  Lab 09/14/23 2249  GLUCAP 134*    Review of Systems:   Unable to be obtained secondary to the patient's intubated and sedated status.   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), Cognitive deficit as late effect of traumatic brain injury (HCC) (03/06/2016), Coronary artery disease,  Dementia (HCC), Depression, Disorder of male genital organs (07/11/2013), Dyspnea, Encounter for long-term (current) use of medications (11/21/2014), Erectile dysfunction (07/11/2013), Executive function deficit (03/06/2016), GERD (gastroesophageal reflux disease), Headache, Hearing loss, Hyperlipidemia, Hypertension, Hypogonadism, male (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   Surgical History    Past Surgical History:  Procedure Laterality Date   BACK SURGERY  2011   rods and screws   CLOSED REDUCTION NASAL FRACTURE Bilateral 06/11/2018   Procedure: CLOSED REDUCTION NASAL FRACTURE;  Surgeon: [name], MD;  Location: [facility] OR[facility];  Service: ENT;  Laterality: Bilateral;   COLONOSCOPY  2015   CORONARY ANGIOPLASTY     2015 stent   CORONARY STENT INTERVENTION N/A 06/08/2019   Procedure: CORONARY STENT INTERVENTION;  Surgeon: Antonette Batters, MD;  Location: [facility];  Service: Cardiovascular;  Laterality: N/A;   CYSTOSCOPY  1984   EYE SURGERY Bilateral 2005,2006,2009   detached retina, cataract   FOOT ARTHRODESIS Left 05/30/2015   Procedure: ARTHRODESIS FOOT STJ LT FOOT;  Surgeon: Anell Baptist, DPM;  Location: [facility];  Service: Podiatry;  Laterality: Left;  WITH POPLITEAL BLOCK   FOOT SURGERY Left    HERNIA REPAIR Right 1969   inguinal   LEFT HEART CATH AND CORONARY ANGIOGRAPHY Left 06/08/2019   Procedure: LEFT HEART CATH AND CORONARY ANGIOGRAPHY;  Surgeon: Antonette Batters, MD;  Location: [facility];  Service: Cardiovascular;  Laterality: Left;   SEPTOPLASTY Bilateral 06/11/2018   Procedure: SEPTOPLASTY;  Surgeon: [name], MD;  Location: [facility] OR[facility];  Service: ENT;  Laterality: Bilateral;   SHOULDER SURGERY Right 2004   skull surgery     TOE SURGERY Right 2009   TONSILLECTOMY  2008     Social History   reports that he has never smoked. He has never used smokeless tobacco. He reports that he does not drink alcohol and does not use drugs.   Family History   His family history includes Asperger's syndrome in his mother.   Allergies Allergies  Allergen Reactions   Mirtazapine Other (See Comments)    Other reaction(s): Other (See Comments) Irritability/anger, increased appetite, worsening depression Irritability/anger, increased appetite, worsening depression      Home Medications  Prior to Admission medications   Medication Sig Start Date End Date Taking? Authorizing Provider  acetaminophen  (TYLENOL ) 500 MG tablet Take 1,000 mg by mouth every 6 (six) hours as needed for mild pain or moderate pain.   Yes [provider]  aspirin  EC 81 MG tablet Take 81 mg by mouth daily.   Yes [provider]  B Complex-C (SUPER B COMPLEX PO) Take 1 tablet by mouth daily.   Yes [provider]  Calcium  Carbonate-Vitamin D  600-200 MG-UNIT CAPS Take 1 tablet by mouth 2 (two) times daily.    Yes [provider]  carvedilol  (COREG ) 3.125 MG  tablet Take 3.125 mg by mouth daily.  11/03/18  Yes [provider]  cefdinir (OMNICEF) 300 MG capsule Take 300 mg by mouth 2 (two) times daily. 09/14/23 09/21/23 Yes [provider]  clindamycin  (CLEOCIN  T) 1 % lotion Apply 1 application topically 2 (two) times daily as needed.    Yes [provider]  clopidogrel  (PLAVIX ) 75 MG tablet Take 75 mg by mouth daily. 09/27/18  Yes [provider]  divalproex  (DEPAKOTE  ER) 250 MG 24 hr tablet Total of 750 mg daily.  Take along with 500 mg tab 08/24/23  Yes Hisada, Ivan Marion, MD  divalproex  (DEPAKOTE  ER) 500 MG 24 hr tablet Take 1 tablet (500 mg total) by mouth daily. 08/13/23 10/12/23 Yes Todd Fossa, MD  DULoxetine  (CYMBALTA ) 60 MG capsule Take 60 mg by mouth 2 (two) times daily.  11/17/18  Yes [provider]  FIBER PO Take 1 tablet by mouth 2 (two) times daily.    Yes [provider]  furosemide  (LASIX ) 20 MG tablet Take 20 mg by mouth daily. 11/03/18  Yes [provider]  latanoprost (XALATAN) 0.005 % ophthalmic solution Place 1 drop into the left eye daily. 11/06/21  Yes [provider]  losartan (COZAAR) 100 MG tablet Take 100 mg by mouth daily.   Yes [provider]  Melatonin 5 MG TABS Take 10 mg by mouth at bedtime. 30 min before bed   Yes [provider]  omeprazole (PRILOSEC) 20 MG capsule Take 20 mg by mouth daily.   Yes [provider]  Probiotic Product (PROBIOTIC DAILY PO) Take 1 tablet by mouth daily. Senior   Yes [provider]  promethazine-dextromethorphan (PROMETHAZINE-DM) 6.25-15 MG/5ML syrup Take 5 mLs by mouth every 8 (eight) hours as needed. 09/14/23  Yes [provider]  silodosin  (RAPAFLO ) 8 MG CAPS capsule TAKE 1 CAPSULE BY MOUTH DAILY WITH BREAKFAST Patient taking differently: Take 8 mg by mouth daily. 03/16/23  Yes Stoioff, Kizzie Perks, MD  sodium chloride  (OCEAN) 0.65 % SOLN nasal spray Place 2 sprays into both nostrils as needed for congestion.   Yes [provider]  tadalafil  (CIALIS ) 5 MG tablet Take 1 tablet by mouth once daily 08/03/23  Yes Stoioff, Scott C, MD  SYRINGE-NEEDLE, DISP, 3 ML (B-D 3CC LUER-LOK SYR 22GX1-1/2) 22G X 1-1/2" 3 ML MISC USE AS DIRECTED WITH TESTOSTERONE  08/24/23   Stoioff, Kizzie Perks, MD  testosterone  cypionate (DEPOTESTOSTERONE CYPIONATE) 200 MG/ML injection Inject 0.3 mLs (60 mg total) into the muscle once a week. DISCARD REMAINING AS THESE ARE SINGLE USE VIALS. Patient taking  differently: Inject 60 mg into the muscle every 14 (fourteen) days. EVERY OTHER SATURDAY. DISCARD REMAINING AS THESE ARE SINGLE USE VIALS. 06/16/23   Stoioff, Kizzie Perks, MD  Scheduled Meds:  bromocriptine  2.5 mg Per Tube Q8H   Chlorhexidine  Gluconate Cloth  6 each Topical Daily   docusate  100 mg Per Tube BID   famotidine   20 mg Per Tube BID   ipratropium-albuterol  3 mL Nebulization Q6H   LORazepam       LORazepam       LORazepam  2 mg Intravenous Once   mouth rinse  15 mL Mouth Rinse Q2H   polyethylene glycol  17 g Per Tube Daily   Continuous Infusions:  acetaminophen  Stopped (09/14/23 2136)   acyclovir     ampicillin (OMNIPEN) IV     calcium  gluconate     cefTRIAXone (ROCEPHIN)  IV     fentaNYL  infusion  INTRAVENOUS Stopped (09/15/23 0051)   magnesium  sulfate bolus IVPB     norepinephrine (LEVOPHED) Adult infusion 17 mcg/min (09/15/23 0132)   propofol  (DIPRIVAN ) infusion Stopped (09/15/23 0046)   [START ON 09/16/2023] vancomycin     PRN Meds:.fentaNYL , ipratropium-albuterol, LORazepam, LORazepam, midazolam    Active Hospital Problem list   See systems below  Assessment & Plan:  #Acute Hypoxic Respiratory Failure #Pneumonia #Pulmonary Edema #NMS chest wall rigidity?   -con't full mechanical support 6-8cc/kg/Vt  -titrate FiO2, PEEP to maintain O2 sat >90%  -Lung protective ventilation  -PRN Chest X-ray & ABG -PRN and scheduled bronchodilators -systemic steroid if appropriate -SAT/SBT when appropriate  -prn fentanyl , prop for RASS -1   #Acute Metabolic Encephalopathy suspect in the setting of Neuroleptic Malignant Syndrome vs Sepsis Hx of severe TBI with cognitive deficit,Bilateral fronto temporal encephalomalacia  Patient met all the required DSM-V criteria for diagnosing NMS as follows: Major Criteria (all required) Exposure to dopamine-blocking agent (received promethazine also on depakote ), Severe muscle rigidity, persistent fever >103.3 refractory to IV Tylenol  and  IV Ibuprofen. Other Criteria (at least two required) pt with extreme Diaphoresis, Tremor, Incontinence per wife, AMS, Tachycardia >180bpm, Elevated bp>160 initialy and Elevated CK.  -CTH neg -supportive care for hyperthermia -IVFs fluid, electrolytes, and rhabdomyolysis management -Maintain k+>4 mag>2 high risk for cardiac dysrhythmias and respiratory failure due to chest wall rigidity currently on mechanical ventilation support. -PRN antiarrhythmic agents as bp allows, it appears ICD is functional -Avoid dopamine blocking agents -Obtain EEG -Start Bromocriptine 2.5 mg PO q8hr, may titrate up to 5-10 mg q6-8hr.Maximal dose is 40 mg/day -Neurology consult, discussed with Dr. Renaee Caro who agreed with plan as above  #Sepsis   concerns for Menengitis although low likelihood and probable pneumonia meets SIRS criteria: Heart Rate 170 beats/minute, Respiratory Rate 30-40 breaths/minute,Temperature 103.3, Shock Index (SI) -F/u cultures, trend lactic/ PCT -Obtain MRSA nasal swab, RVP, legionella, strep urine Ag -Monitor WBC/ fever curve -IVF hydration as needed -Continue broad coverage for meningitis -LP pending -Pressors for MAP goal >65 -Strict I/O's  #Acute on Chronic HFrEF #NSTEMI Hx:CAD, Ischemic Cardiomyopathy, MI, HTN, HLD, SVT -Troponin has trended to > 7453 -Likely demand in the setting of persistent SVT/HR>180 on presentation -EKG overnight does not meet STEMI criteria, Serial EKGs -Start Heparin  gtt per ACS/STEMI  -Hold GDMT for now -Repeat 2D Echo -Cardiology consult   Best practice:  Diet:  NPO Pain/Anxiety/Delirium protocol (if indicated): Yes (RASS goal -1) VAP protocol (if indicated): Yes DVT prophylaxis: Systemic AC GI prophylaxis: H2B Glucose control:  SSI No Central venous access:  Yes, and it is still needed Arterial line:  Yes, and it is still needed Foley:  Yes, and it is still needed Mobility:  bed rest  PT consulted: N/A Last date of multidisciplinary  goals of care discussion [updated wife at the bedside] Code Status:  full code Disposition: ICU   = Goals of Care = Code Status Order:FULL  Primary Emergency Contact: Udell,Cynthia, Home Phone: [phone] Wishes to pursue full aggressive treatment and intervention options, including CPR and intubation, but goals of care will be addressed on going with family if that should become necessary.  Critical care time: 45 minutes        Alonza Arthurs DNP, CCRN, FNP-C, AGACNP-BC Acute Care & Family Nurse Practitioner [facility] PCCM on call pager [phone]

## 2023-09-15 ENCOUNTER — Inpatient Hospital Stay (HOSPITAL_COMMUNITY)
Admit: 2023-09-15 | Discharge: 2023-09-15 | Disposition: A | Discharge status: Home / Self Care | Attending: Pulmonary Disease | Admitting: Pulmonary Disease

## 2023-09-15 ENCOUNTER — Inpatient Hospital Stay

## 2023-09-15 DIAGNOSIS — G9341 Metabolic encephalopathy: Secondary | ICD-10-CM

## 2023-09-15 DIAGNOSIS — R4182 Altered mental status, unspecified: Secondary | ICD-10-CM

## 2023-09-15 DIAGNOSIS — J9601 Acute respiratory failure with hypoxia: Secondary | ICD-10-CM | POA: Diagnosis not present

## 2023-09-15 DIAGNOSIS — I5020 Unspecified systolic (congestive) heart failure: Secondary | ICD-10-CM

## 2023-09-15 DIAGNOSIS — I5023 Acute on chronic systolic (congestive) heart failure: Secondary | ICD-10-CM | POA: Diagnosis not present

## 2023-09-15 DIAGNOSIS — J810 Acute pulmonary edema: Secondary | ICD-10-CM

## 2023-09-15 DIAGNOSIS — J189 Pneumonia, unspecified organism: Secondary | ICD-10-CM | POA: Diagnosis not present

## 2023-09-15 DIAGNOSIS — A419 Sepsis, unspecified organism: Principal | ICD-10-CM

## 2023-09-15 DIAGNOSIS — J690 Pneumonitis due to inhalation of food and vomit: Secondary | ICD-10-CM

## 2023-09-15 DIAGNOSIS — I214 Non-ST elevation (NSTEMI) myocardial infarction: Secondary | ICD-10-CM

## 2023-09-15 DIAGNOSIS — R7989 Other specified abnormal findings of blood chemistry: Secondary | ICD-10-CM | POA: Diagnosis not present

## 2023-09-15 LAB — RESPIRATORY PANEL BY PCR

## 2023-09-15 LAB — COMPREHENSIVE METABOLIC PANEL WITH GFR
ALT: 26 U/L (ref 0–44)
AST: 76 U/L — ABNORMAL HIGH (ref 15–41)
Albumin: 3.1 g/dL — ABNORMAL LOW (ref 3.5–5.0)
Alkaline Phosphatase: 40 U/L (ref 38–126)
Anion gap: 13 (ref 5–15)
BUN: 18 mg/dL (ref 8–23)
CO2: 18 mmol/L — ABNORMAL LOW (ref 22–32)
Calcium: 7.5 mg/dL — ABNORMAL LOW (ref 8.9–10.3)
Chloride: 107 mmol/L (ref 98–111)
Creatinine, Ser: 1.15 mg/dL (ref 0.61–1.24)
GFR, Estimated: >60 mL/min (ref >60)
Glucose, Bld: 135 mg/dL — ABNORMAL HIGH (ref 70–99)
Potassium: 3.6 mmol/L (ref 3.5–5.1)
Sodium: 138 mmol/L (ref 135–145)
Total Bilirubin: 0.8 mg/dL (ref 0.0–1.2)
Total Protein: 5.8 g/dL — ABNORMAL LOW (ref 6.5–8.1)

## 2023-09-15 LAB — CBC
HCT: 40.3 % (ref 39.0–52.0)
Hemoglobin: 13.8 g/dL (ref 13.0–17.0)
MCH: 31.8 pg (ref 26.0–34.0)
MCHC: 34.2 g/dL (ref 30.0–36.0)
MCV: 92.9 fL (ref 80.0–100.0)
Platelets: 178 10*3/uL (ref 150–400)
RBC: 4.34 MIL/uL (ref 4.22–5.81)
RDW: 13.8 % (ref 11.5–15.5)
WBC: 6.6 10*3/uL (ref 4.0–10.5)
nRBC: 0 % (ref 0.0–0.2)

## 2023-09-15 LAB — BASIC METABOLIC PANEL WITH GFR
Anion gap: 9 (ref 5–15)
Anion gap: 7 (ref 5–15)
BUN: 17 mg/dL (ref 8–23)
BUN: 17 mg/dL (ref 8–23)
CO2: 20 mmol/L — ABNORMAL LOW (ref 22–32)
CO2: 22 mmol/L (ref 22–32)
Calcium: 7.7 mg/dL — ABNORMAL LOW (ref 8.9–10.3)
Calcium: 7.7 mg/dL — ABNORMAL LOW (ref 8.9–10.3)
Chloride: 107 mmol/L (ref 98–111)
Chloride: 109 mmol/L (ref 98–111)
Creatinine, Ser: 1.05 mg/dL (ref 0.61–1.24)
Creatinine, Ser: 1.08 mg/dL (ref 0.61–1.24)
GFR, Estimated: >60 mL/min (ref >60)
GFR, Estimated: >60 mL/min (ref >60)
Glucose, Bld: 145 mg/dL — ABNORMAL HIGH (ref 70–99)
Glucose, Bld: 147 mg/dL — ABNORMAL HIGH (ref 70–99)
Potassium: 2.8 mmol/L — ABNORMAL LOW (ref 3.5–5.1)
Potassium: 5 mmol/L (ref 3.5–5.1)
Sodium: 136 mmol/L (ref 135–145)
Sodium: 138 mmol/L (ref 135–145)

## 2023-09-15 LAB — GLUCOSE, CAPILLARY
Glucose-Capillary: 105 mg/dL — ABNORMAL HIGH (ref 70–99)
Glucose-Capillary: 124 mg/dL — ABNORMAL HIGH (ref 70–99)
Glucose-Capillary: 112 mg/dL — ABNORMAL HIGH (ref 70–99)
Glucose-Capillary: 142 mg/dL — ABNORMAL HIGH (ref 70–99)
Glucose-Capillary: 114 mg/dL — ABNORMAL HIGH (ref 70–99)
Glucose-Capillary: 115 mg/dL — ABNORMAL HIGH (ref 70–99)

## 2023-09-15 LAB — PROTIME-INR
INR: 1.2 (ref 0.8–1.2)
Prothrombin Time: 15.6 s — ABNORMAL HIGH (ref 11.4–15.2)

## 2023-09-15 LAB — LACTIC ACID, PLASMA: Lactic Acid, Venous: 1.4 mmol/L (ref 0.5–1.9)

## 2023-09-15 LAB — PROCALCITONIN: Procalcitonin: 3.43 ng/mL

## 2023-09-15 LAB — CK: Total CK: 612 U/L — ABNORMAL HIGH (ref 49–397)

## 2023-09-15 LAB — FIBRINOGEN: Fibrinogen: 354 mg/dL (ref 210–475)

## 2023-09-15 LAB — MRSA NEXT GEN BY PCR, NASAL: MRSA by PCR Next Gen: NOT DETECTED

## 2023-09-15 LAB — STREP PNEUMONIAE URINARY ANTIGEN: Strep Pneumo Urinary Antigen: NEGATIVE

## 2023-09-15 LAB — HEPARIN LEVEL (UNFRACTIONATED)
Heparin Unfractionated: 0.3 [IU]/mL (ref 0.30–0.70)
Heparin Unfractionated: 0.2 [IU]/mL — ABNORMAL LOW (ref 0.30–0.70)

## 2023-09-15 LAB — TROPONIN I (HIGH SENSITIVITY)
Troponin I (High Sensitivity): 7453 ng/L (ref <18)
Troponin I (High Sensitivity): 15306 ng/L (ref <18)
Troponin I (High Sensitivity): 15207 ng/L (ref <18)

## 2023-09-15 LAB — MAGNESIUM: Magnesium: 2.2 mg/dL (ref 1.7–2.4)

## 2023-09-15 LAB — PLATELET COUNT: Platelets: 172 10*3/uL (ref 150–400)

## 2023-09-15 LAB — APTT: aPTT: 29 s (ref 24–36)

## 2023-09-15 LAB — PHOSPHORUS: Phosphorus: 2.4 mg/dL — ABNORMAL LOW (ref 2.5–4.6)

## 2023-09-15 LAB — TSH: TSH: 1.658 u[IU]/mL (ref 0.350–4.500)

## 2023-09-15 MED ORDER — POTASSIUM CHLORIDE 20 MEQ PO PACK
40.0000 meq | PACK | Freq: Two times a day (BID) | ORAL | Status: DC
  Administered 2023-09-15: 40 meq
  Filled 2023-09-15: qty 2

## 2023-09-15 MED ORDER — HEPARIN BOLUS VIA INFUSION
1250.0000 [IU] | Freq: Once | INTRAVENOUS | Status: AC
  Administered 2023-09-15: 1250 [IU] via INTRAVENOUS
  Filled 2023-09-15: qty 1250

## 2023-09-15 MED ORDER — SODIUM CHLORIDE 0.9 % IV SOLN
2.0000 g | INTRAVENOUS | Status: DC
  Administered 2023-09-15 (×2): 2 g via INTRAVENOUS
  Filled 2023-09-15 (×3): qty 2000

## 2023-09-15 MED ORDER — HYDROCORTISONE SOD SUC (PF) 100 MG IJ SOLR: 50.0000 mg | Freq: Four times a day (QID) | INTRAMUSCULAR | Status: DC

## 2023-09-15 MED ORDER — CALCIUM GLUCONATE-NACL 1-0.675 GM/50ML-% IV SOLN
1.0000 g | Freq: Once | INTRAVENOUS | Status: AC
  Administered 2023-09-15: 1000 mg via INTRAVENOUS
  Filled 2023-09-15: qty 50

## 2023-09-15 MED ORDER — IBUPROFEN 800 MG/200ML IV SOLN
800.0000 mg | Freq: Once | INTRAVENOUS | Status: AC
Start: 2023-09-15 — End: 2023-09-15
  Administered 2023-09-15: 800 mg via INTRAVENOUS
  Filled 2023-09-15: qty 200

## 2023-09-15 MED ORDER — SODIUM CHLORIDE 0.9 % IV SOLN
2.0000 g | Freq: Two times a day (BID) | INTRAVENOUS | Status: DC
  Filled 2023-09-15: qty 20

## 2023-09-15 MED ORDER — VANCOMYCIN HCL 1750 MG/350ML IV SOLN: 1750.0000 mg | INTRAVENOUS | Status: DC

## 2023-09-15 MED ORDER — PANTOPRAZOLE SODIUM 40 MG IV SOLR
40.0000 mg | Freq: Every day | INTRAVENOUS | Status: DC
  Administered 2023-09-15 – 2023-09-28 (×14): 40 mg via INTRAVENOUS
  Filled 2023-09-15 (×14): qty 10

## 2023-09-15 MED ORDER — IBUPROFEN 800 MG/8ML IV SOLN: 800.0000 mg | Freq: Once | INTRAVENOUS | Status: DC

## 2023-09-15 MED ORDER — BROMOCRIPTINE MESYLATE 2.5 MG PO TABS
2.5000 mg | ORAL_TABLET | Freq: Three times a day (TID) | ORAL | Status: DC
  Administered 2023-09-15: 2.5 mg
  Filled 2023-09-15 (×2): qty 1

## 2023-09-15 MED ORDER — HEPARIN (PORCINE) 25000 UT/250ML-% IV SOLN
1650.0000 [IU]/h | INTRAVENOUS | Status: DC
  Administered 2023-09-15: 1200 [IU]/h via INTRAVENOUS
  Administered 2023-09-15: 1000 [IU]/h via INTRAVENOUS
  Administered 2023-09-16: 1350 [IU]/h via INTRAVENOUS
  Filled 2023-09-15 (×3): qty 250

## 2023-09-15 MED ORDER — HEPARIN BOLUS VIA INFUSION
4000.0000 [IU] | Freq: Once | INTRAVENOUS | Status: AC
  Administered 2023-09-15: 4000 [IU] via INTRAVENOUS
  Filled 2023-09-15: qty 4000

## 2023-09-15 MED ORDER — SODIUM CHLORIDE 0.9 % IV SOLN
500.0000 mg | INTRAVENOUS | Status: AC
  Administered 2023-09-15 – 2023-09-19 (×5): 500 mg via INTRAVENOUS
  Filled 2023-09-15 (×5): qty 5

## 2023-09-15 MED ORDER — SODIUM CHLORIDE 0.9 % IV SOLN
2.0000 g | INTRAVENOUS | Status: DC
  Administered 2023-09-15 – 2023-09-18 (×4): 2 g via INTRAVENOUS
  Filled 2023-09-15 (×4): qty 20

## 2023-09-15 MED ORDER — NOREPINEPHRINE 16 MG/250ML-% IV SOLN
0.0000 ug/min | INTRAVENOUS | Status: DC
  Administered 2023-09-15: 14 ug/min via INTRAVENOUS
  Administered 2023-09-16: 8 ug/min via INTRAVENOUS
  Filled 2023-09-15 (×2): qty 250

## 2023-09-15 MED ORDER — VANCOMYCIN HCL IN DEXTROSE 1-5 GM/200ML-% IV SOLN: 1000.0000 mg | Freq: Once | INTRAVENOUS | Status: DC

## 2023-09-15 MED ORDER — MAGNESIUM SULFATE 2 GM/50ML IV SOLN
2.0000 g | Freq: Once | INTRAVENOUS | Status: AC
  Administered 2023-09-15: 2 g via INTRAVENOUS
  Filled 2023-09-15: qty 50

## 2023-09-15 MED ORDER — ASPIRIN 81 MG PO CHEW
81.0000 mg | CHEWABLE_TABLET | Freq: Every day | ORAL | Status: DC
  Administered 2023-09-15 – 2023-09-21 (×7): 81 mg
  Filled 2023-09-15 (×8): qty 1

## 2023-09-15 MED ORDER — HYDROCORTISONE SOD SUC (PF) 100 MG IJ SOLR
100.0000 mg | Freq: Two times a day (BID) | INTRAMUSCULAR | Status: DC
  Administered 2023-09-15 – 2023-09-16 (×4): 100 mg via INTRAVENOUS
  Filled 2023-09-15 (×5): qty 2

## 2023-09-15 MED ORDER — DEXTROSE 5 % IV SOLN
10.0000 mg/kg | Freq: Three times a day (TID) | INTRAVENOUS | Status: DC
  Filled 2023-09-15: qty 17.6

## 2023-09-15 MED ORDER — POTASSIUM CHLORIDE 10 MEQ/50ML IV SOLN
10.0000 meq | INTRAVENOUS | Status: AC
  Administered 2023-09-15 (×3): 10 meq via INTRAVENOUS
  Filled 2023-09-15 (×3): qty 50

## 2023-09-15 NOTE — Consult Note (Signed)
 [facility] CLINIC CARDIOLOGY CONSULT NOTE       Patient ID: Blake [name] MRN: [id number] DOB/AGE: [birth year] 73 y.o.  Admit date: 09/14/2023 Referring Physician Dr. Vergia Glasgow Primary Physician Rory Collard, MD Primary Cardiologist Dr. Beau Bound Reason for Consultation Elevated trops, NSTEMI  HPI: Blake [name] is a 73 y.o. male  with a past medical history of coronary artery disease s/p stent to LAD, ischemic cardiomyopathy s/p ICD ([employer]), chronic HFrEF, OSA (on CPAP), hx CVA who presented to the ED on 09/14/2023 for dyspnea, chest pain and AMS. Patient recently seen at urgent care for nonproductive cough, chills/diaphoresis Troponins found to be elevated.  Cardiology was consulted for further evaluation.   Patient currently intubated.  Majority of patient's history obtained from chart review.  Patient presented to the ED with altered mental status, chest pain and dyspnea. Patient had 2 days of nonproductive cough with chills/body aches. Work up in the ED notable for sodium 138, potassium 3.9, creatinine 1.03, hemoglobin 15, platelets 222.  ABG with pH 7.49, PCO237, bicarb 28.  Lactate 2.6.  CK 136 > 612.CXR with bilateral vascular congestion.  BNP elevated at 570.  Troponins elevated and trending 100 > 7400 > 15300 > 15200.  EKG in ED with sinus tachycardia, rate 144 bpm then sinus rhythm rate 90s.  Patient started on heparin  infusion, received 1 dose of IV metoprolol tartrate 5 mg, received IV antibiotics and intubated due to high risk for decompensation and airway protection.  At the time of my evaluation this AM, patient intubated and on pressors. BP and HR variable.    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   Coronary artery disease    Dementia (HCC)    Depression    Disorder of male genital organs 07/11/2013   Dyspnea    Encounter for long-term (current) use of medications 11/21/2014   Erectile dysfunction 07/11/2013   Executive function deficit 03/06/2016   GERD (gastroesophageal reflux disease)    Headache    stress   Hearing loss    Hyperlipidemia    Hypertension    Hypogonadism, 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   Major depression, recurrent, chronic (HCC) 03/06/2016   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   Mixed hyperlipidemia 02/02/2014   Myocardial infarction (HCC) 08/2013   OCD (obsessive compulsive disorder)    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   Other specified disorder of male genital organs(608.89) 07/11/2013   Prostatic hypertrophy    Pseudophakia of both eyes 04/20/2015   Raynaud's disease    Reduced libido 07/11/2013   Repeated falls    weak left ankle   Scoliosis    Stroke (HCC) 2/16   "light"   Synovitis    knees, ankles    Past Surgical History:  Procedure Laterality Date   BACK SURGERY  2011   rods and screws   CLOSED REDUCTION NASAL FRACTURE Bilateral 06/11/2018  Procedure: CLOSED REDUCTION NASAL FRACTURE;  Surgeon: [name], MD;  Location: [facility] ORS;  Service: ENT;  Laterality: Bilateral;   COLONOSCOPY  2015   CORONARY ANGIOPLASTY     2015 stent   CORONARY STENT INTERVENTION N/A 06/08/2019   Procedure: CORONARY STENT INTERVENTION;  Surgeon: Antonette Batters, MD;  Location: [facility] INVASIVE CV [facility];  Service: Cardiovascular;  Laterality: N/A;   CYSTOSCOPY  1984   EYE SURGERY Bilateral 2005,2006,2009   detached retina, cataract   FOOT ARTHRODESIS Left 05/30/2015    Procedure: ARTHRODESIS FOOT STJ LT FOOT;  Surgeon: Anell Baptist, DPM;  Location: [facility];  Service: Podiatry;  Laterality: Left;  WITH POPLITEAL BLOCK   FOOT SURGERY Left    HERNIA REPAIR Right 1969   inguinal   LEFT HEART CATH AND CORONARY ANGIOGRAPHY Left 06/08/2019   Procedure: LEFT HEART CATH AND CORONARY ANGIOGRAPHY;  Surgeon: Antonette Batters, MD;  Location: [facility] INVASIVE CV LAB;  Service: Cardiovascular;  Laterality: Left;   SEPTOPLASTY Bilateral 06/11/2018   Procedure: SEPTOPLASTY;  Surgeon: [name], MD;  Location: [facility] ORS;  Service: ENT;  Laterality: Bilateral;   SHOULDER SURGERY Right 2004   skull surgery     TOE SURGERY Right 2009   TONSILLECTOMY  2008    Medications Prior to Admission  Medication Sig Dispense Refill Last Dose/Taking   acetaminophen  (TYLENOL ) 500 MG tablet Take 1,000 mg by mouth every 6 (six) hours as needed for mild pain or moderate pain.   Unknown   aspirin  EC 81 MG tablet Take 81 mg by mouth daily.   09/14/2023 at  6:00 AM   B Complex-C (SUPER B COMPLEX PO) Take 1 tablet by mouth daily.   09/14/2023 Morning   Calcium  Carbonate-Vitamin D  600-200 MG-UNIT CAPS Take 1 tablet by mouth 2 (two) times daily.    09/14/2023 Morning   carvedilol  (COREG ) 3.125 MG tablet Take 3.125 mg by mouth daily.    09/14/2023 Morning   cefdinir (OMNICEF) 300 MG capsule Take 300 mg by mouth 2 (two) times daily.   09/14/2023 Noon   clindamycin  (CLEOCIN  T) 1 % lotion Apply 1 application topically 2 (two) times daily as needed.    Unknown   clopidogrel  (PLAVIX ) 75 MG tablet Take 75 mg by mouth daily.   09/14/2023 at  6:00 AM   divalproex  (DEPAKOTE  ER) 250 MG 24 hr tablet Total of 750 mg daily. Take along with 500 mg tab 30 tablet 0 09/14/2023 at  8:00 AM   divalproex  (DEPAKOTE  ER) 500 MG 24 hr tablet Take 1 tablet (500 mg total) by mouth daily. 30 tablet 1 09/14/2023 at  8:00 AM   DULoxetine  (CYMBALTA ) 60 MG capsule Take 60 mg by mouth 2 (two) times daily.    09/14/2023 at  3:30  AM   FIBER PO Take 1 tablet by mouth 2 (two) times daily.    09/14/2023 Morning   furosemide  (LASIX ) 20 MG tablet Take 20 mg by mouth daily.   09/14/2023 Morning   latanoprost (XALATAN) 0.005 % ophthalmic solution Place 1 drop into the left eye daily.   09/14/2023 Morning   losartan (COZAAR) 100 MG tablet Take 100 mg by mouth daily.   09/14/2023 Morning   Melatonin 5 MG TABS Take 10 mg by mouth at bedtime. 30 min before bed   09/13/2023 Bedtime   omeprazole (PRILOSEC) 20 MG capsule Take 20 mg by mouth daily.   09/14/2023 Morning   Probiotic Product (PROBIOTIC DAILY PO) Take 1 tablet by mouth  daily. Senior   09/14/2023 Morning   silodosin  (RAPAFLO ) 8 MG CAPS capsule TAKE 1 CAPSULE BY MOUTH DAILY WITH BREAKFAST (Patient taking differently: Take 8 mg by mouth daily.) 90 capsule 3 09/13/2023 at  6:00 PM   sodium chloride  (OCEAN) 0.65 % SOLN nasal spray Place 2 sprays into both nostrils as needed for congestion.   09/14/2023 Morning   tadalafil  (CIALIS ) 5 MG tablet Take 1 tablet by mouth once daily 90 tablet 0 09/14/2023 Morning   SYRINGE-NEEDLE, DISP, 3 ML (B-D 3CC LUER-LOK SYR 22GX1-1/2) 22G X 1-1/2" 3 ML MISC USE AS DIRECTED WITH TESTOSTERONE  12 each 20    testosterone  cypionate (DEPOTESTOSTERONE CYPIONATE) 200 MG/ML injection Inject 0.3 mLs (60 mg total) into the muscle once a week. DISCARD REMAINING AS THESE ARE SINGLE USE VIALS. (Patient taking differently: Inject 60 mg into the muscle every 14 (fourteen) days. EVERY OTHER SATURDAY. DISCARD REMAINING AS THESE ARE SINGLE USE VIALS.) 5 mL 0    Social History   Socioeconomic History   Marital status: Married    Spouse name: cindy   Number of children: 1   Years of education: Not on file   Highest education level: Professional school degree (e.g., MD, DDS, DVM, JD)  Occupational History   Not on file  Tobacco Use   Smoking status: Never   Smokeless tobacco: Never  Vaping Use   Vaping status: Never Used  Substance and Sexual Activity   Alcohol use:  No   Drug use: No   Sexual activity: Not Currently    Birth control/protection: None  Other Topics Concern   Not on file  Social History Narrative   Not on file   Social Drivers of Health   Financial Resource Strain: Not on file  Food Insecurity: No Food Insecurity (09/15/2023)   Hunger Vital Sign    Worried About Running Out of Food in the Last Year: Never true    Ran Out of Food in the Last Year: Never true  Transportation Needs: No Transportation Needs (09/15/2023)   PRAPARE - Administrator, Civil Service (Medical): No    Lack of Transportation (Non-Medical): No  Physical Activity: Not on file  Stress: Not on file  Social Connections: Patient Unable To Answer (09/15/2023)   Social Connection and Isolation Panel [NHANES]    Frequency of Communication with Friends and Family: Patient unable to answer    Frequency of Social Gatherings with Friends and Family: Patient unable to answer    Attends Religious Services: Patient unable to answer    Active Member of Clubs or Organizations: Patient unable to answer    Attends Banker Meetings: Patient unable to answer    Marital Status: Patient unable to answer  Intimate Partner Violence: Unknown (09/15/2023)   Humiliation, Afraid, Rape, and Kick questionnaire    Fear of Current or Ex-Partner: Patient unable to answer    Emotionally Abused: Patient unable to answer    Physically Abused: Not on file    Sexually Abused: Patient unable to answer    Family History  Problem Relation Age of Onset   Asperger's syndrome Mother      Vitals:   09/15/23 1230 09/15/23 1300 09/15/23 1400 09/15/23 1500  BP:  117/75 120/72 109/62  Pulse: 89 83 86 87  Resp: 20 20 20 20   Temp: 97.9 F (36.6 C) 98.1 F (36.7 C) (!) 97.2 F (36.2 C) 97.9 F (36.6 C)  TempSrc:  Esophageal Esophageal   SpO2: 97% 97%  98% 98%  Weight:      Height:        PHYSICAL EXAM General: Chronically ill-appearing elderly male, well nourished, in  no acute distress. HEENT: Normocephalic and atraumatic. Neck: No JVD.   Lungs: Intubated.  Mechanical breath sounds bilaterally. Heart: HRRR. Normal S1 and S2 without gallops or murmurs.  Abdomen: Non-distended appearing.  Msk: Normal strength and tone for age. Extremities: Warm and well perfused. No clubbing, cyanosis. No edema.  Neuro: Alert and oriented X 3. Psych: Answers questions appropriately.   Labs: Basic Metabolic Panel: Recent Labs    09/14/23 1835 09/15/23 0308 09/15/23 0612  NA 138 138 136  K 3.9 3.6 2.8*  CL 105 107 107  CO2 21* 18* 20*  GLUCOSE 127* 135* 145*  BUN 15 18 17   CREATININE 1.03 1.15 1.05  CALCIUM  8.9 7.5* 7.7*  MG 1.8  --  2.2  PHOS  --   --  2.4*   Liver Function Tests: Recent Labs    09/14/23 1835 09/15/23 0308  AST 25 76*  ALT 24 26  ALKPHOS 55 40  BILITOT 0.9 0.8  PROT 7.2 5.8*  ALBUMIN 4.0 3.1*   No results for input(s): "LIPASE", "AMYLASE" in the last 72 hours. CBC: Recent Labs    09/14/23 1835 09/15/23 0244 09/15/23 0612  WBC 9.3 6.6  --   NEUTROABS 7.5  --   --   HGB 15.6 13.8  --   HCT 45.0 40.3  --   MCV 91.8 92.9  --   PLT 222 178 172   Cardiac Enzymes: Recent Labs    09/14/23 1835 09/15/23 0128 09/15/23 0308 09/15/23 0812 09/15/23 1007  CKTOTAL 136  --  612*  --   --   TROPONINIHS 106* 7,453*  --  15,306* 15,207*   BNP: Recent Labs    09/14/23 1835  BNP 573.5*   D-Dimer: No results for input(s): "DDIMER" in the last 72 hours. Hemoglobin A1C: No results for input(s): "HGBA1C" in the last 72 hours. Fasting Lipid Panel: No results for input(s): "CHOL", "HDL", "LDLCALC", "TRIG", "CHOLHDL", "LDLDIRECT" in the last 72 hours. Thyroid Function Tests: Recent Labs    09/15/23 0308  TSH 1.658   Anemia Panel: No results for input(s): "VITAMINB12", "FOLATE", "FERRITIN", "TIBC", "IRON", "RETICCTPCT" in the last 72 hours.   Radiology: [facility] Chest Port 1 View Result Date: 09/15/2023 EXAM: 1 VIEW XRAY OF THE  CHEST 09/15/2023 10:35:00 AM COMPARISON: 1 view chest x-ray 05/16/2023 at 2:38 PM. CLINICAL HISTORY: Endotracheally intubated. FINDINGS: LUNGS AND PLEURA: Mild edema and bilateral effusions are similar to the prior study. Bibasilar airspace opacity likely reflects atelectasis. HEART AND MEDIASTINUM: The heart is enlarged. BONES AND SOFT TISSUES: No acute osseous abnormality. LINES AND TUBES: The endotracheal tube is stable, 3.5 cm above the carina. A right IJ line is stable. IMPRESSION: 1. Stable endotracheal tube and right IJ line. 2. Enlarged heart. 3. Mild edema and bilateral effusions, similar to the prior study. 4. Bibasilar airspace opacity, likely atelectasis. Electronically signed by: Audree Leas MD 09/15/2023 01:26 PM EDT RP Workstation: [id number]   DG Chest Port 1 View Result Date: 09/15/2023 CLINICAL DATA:  Central line placement EXAM: PORTABLE CHEST 1 VIEW COMPARISON:  Radiograph and CT 09/14/2023 FINDINGS: Stable cardiomegaly. Pulmonary vascular congestion and bilateral ground-glass opacities. Small right pleural effusion and right basilar airspace opacities. No pneumothorax. Endotracheal tube tip in the intrathoracic trachea 3.0 cm from the carina. Subdiaphragmatic enteric tube. Right IJ CVC tip in the mid SVC.  Left chest wall ICD. IMPRESSION: 1. Right IJ CVC tip in the mid SVC. No pneumothorax. 2. Similar findings of congestive heart failure. Electronically Signed   By: Rozell Cornet M.D.   On: 09/15/2023 02:52   CT Angio Chest PE W and/or Wo Contrast Result Date: 09/14/2023 CLINICAL DATA:  Cold symptoms, chest pain, short of breath, abdominal pain EXAM: CT ANGIOGRAPHY CHEST CT ABDOMEN AND PELVIS WITH CONTRAST TECHNIQUE: Multidetector CT imaging of the chest was performed using the standard protocol during bolus administration of intravenous contrast. Multiplanar CT image reconstructions and MIPs were obtained to evaluate the vascular anatomy. Multidetector CT imaging of the abdomen  and pelvis was performed using the standard protocol during bolus administration of intravenous contrast. RADIATION DOSE REDUCTION: This exam was performed according to the departmental dose-optimization program which includes automated exposure control, adjustment of the mA and/or kV according to patient size and/or use of iterative reconstruction technique. CONTRAST:  OMNIPAQUE  IOHEXOL  350 MG/ML SOLN COMPARISON:  01/02/2010, 08/23/2013, 09/14/2023 FINDINGS: CTA CHEST FINDINGS Cardiovascular: This is a technically adequate evaluation of the pulmonary vasculature. No filling defects or pulmonary emboli. Mild cardiomegaly with left ventricular dilatation. No pericardial effusion. Dual lead cardiac pacer, proximal lead in the right atrium and distal lead in the right ventricle. 4.1 cm ascending thoracic aortic aneurysm. No evidence of dissection. Atherosclerosis of the aorta and coronary vasculature. Mediastinum/Nodes: Endotracheal tube terminates just above carina. Enteric catheter extends into the gastric lumen. No pathologic adenopathy. Lungs/Pleura: There is dense bilateral perihilar airspace disease, greatest in the lower lobes. Trace bilateral pleural effusions. No pneumothorax. Central airways are patent. Musculoskeletal: No acute or destructive bony abnormalities. Reconstructed images demonstrate no additional findings. Review of the MIP images confirms the above findings. CT ABDOMEN and PELVIS FINDINGS Hepatobiliary: No focal liver abnormality is seen. No gallstones, gallbladder wall thickening, or biliary dilatation. Pancreas: Unremarkable. No pancreatic ductal dilatation or surrounding inflammatory changes. Spleen: Normal in size without focal abnormality. Adrenals/Urinary Tract: Adrenal glands are unremarkable. Kidneys are normal, without renal calculi, focal lesion, or hydronephrosis. The bladder is decompressed with an indwelling Foley catheter. Stomach/Bowel: No bowel obstruction or ileus. Normal  appendix right lower quadrant. Scattered colonic diverticulosis without evidence of acute diverticulitis. Enteric catheter tip within the lumen of the gastric body. Vascular/Lymphatic: Aortic atherosclerosis. No enlarged abdominal or pelvic lymph nodes. Reproductive: Stable enlargement of the prostate. Other: No free fluid or free intraperitoneal gas. No abdominal wall hernia. Musculoskeletal: No acute or destructive bony abnormalities. Postsurgical changes from L2-L4 posterior fusion. Reconstructed images demonstrate no additional findings. Review of the MIP images confirms the above findings. IMPRESSION: Chest: 1. No evidence of pulmonary embolus. 2. Dense bilateral perihilar airspace disease, greatest in the lower lobes, which could reflect widespread infection or edema. 3. Trace bilateral pleural effusions. 4. Aortic Atherosclerosis (ICD10-I70.0). Coronary artery atherosclerosis. 5. 4.1 cm ascending thoracic aortic aneurysm. Recommend annual imaging followup by CTA or MRA. This recommendation follows 2010 ACCF/AHA/AATS/ACR/ASA/SCA/SCAI/SIR/STS/SVM Guidelines for the Diagnosis and Management of Patients with Thoracic Aortic Disease. Circulation. 2010; 121: U981-X914. Aortic aneurysm NOS (ICD10-I71.9) Abdomen/pelvis: 1. No acute intra-abdominal or intrapelvic process. Normal appendix. 2. Distal colonic diverticulosis without diverticulitis. 3. Enlarged prostate. 4.  Aortic Atherosclerosis (ICD10-I70.0). Electronically Signed   By: Bobbye Burrow M.D.   On: 09/14/2023 22:40   CT ABDOMEN PELVIS W CONTRAST Result Date: 09/14/2023 CLINICAL DATA:  Cold symptoms, chest pain, short of breath, abdominal pain EXAM: CT ANGIOGRAPHY CHEST CT ABDOMEN AND PELVIS WITH CONTRAST TECHNIQUE: Multidetector CT imaging of the  chest was performed using the standard protocol during bolus administration of intravenous contrast. Multiplanar CT image reconstructions and MIPs were obtained to evaluate the vascular anatomy. Multidetector  CT imaging of the abdomen and pelvis was performed using the standard protocol during bolus administration of intravenous contrast. RADIATION DOSE REDUCTION: This exam was performed according to the departmental dose-optimization program which includes automated exposure control, adjustment of the mA and/or kV according to patient size and/or use of iterative reconstruction technique. CONTRAST:  OMNIPAQUE  IOHEXOL  350 MG/ML SOLN COMPARISON:  01/02/2010, 08/23/2013, 09/14/2023 FINDINGS: CTA CHEST FINDINGS Cardiovascular: This is a technically adequate evaluation of the pulmonary vasculature. No filling defects or pulmonary emboli. Mild cardiomegaly with left ventricular dilatation. No pericardial effusion. Dual lead cardiac pacer, proximal lead in the right atrium and distal lead in the right ventricle. 4.1 cm ascending thoracic aortic aneurysm. No evidence of dissection. Atherosclerosis of the aorta and coronary vasculature. Mediastinum/Nodes: Endotracheal tube terminates just above carina. Enteric catheter extends into the gastric lumen. No pathologic adenopathy. Lungs/Pleura: There is dense bilateral perihilar airspace disease, greatest in the lower lobes. Trace bilateral pleural effusions. No pneumothorax. Central airways are patent. Musculoskeletal: No acute or destructive bony abnormalities. Reconstructed images demonstrate no additional findings. Review of the MIP images confirms the above findings. CT ABDOMEN and PELVIS FINDINGS Hepatobiliary: No focal liver abnormality is seen. No gallstones, gallbladder wall thickening, or biliary dilatation. Pancreas: Unremarkable. No pancreatic ductal dilatation or surrounding inflammatory changes. Spleen: Normal in size without focal abnormality. Adrenals/Urinary Tract: Adrenal glands are unremarkable. Kidneys are normal, without renal calculi, focal lesion, or hydronephrosis. The bladder is decompressed with an indwelling Foley catheter. Stomach/Bowel: No bowel  obstruction or ileus. Normal appendix right lower quadrant. Scattered colonic diverticulosis without evidence of acute diverticulitis. Enteric catheter tip within the lumen of the gastric body. Vascular/Lymphatic: Aortic atherosclerosis. No enlarged abdominal or pelvic lymph nodes. Reproductive: Stable enlargement of the prostate. Other: No free fluid or free intraperitoneal gas. No abdominal wall hernia. Musculoskeletal: No acute or destructive bony abnormalities. Postsurgical changes from L2-L4 posterior fusion. Reconstructed images demonstrate no additional findings. Review of the MIP images confirms the above findings. IMPRESSION: Chest: 1. No evidence of pulmonary embolus. 2. Dense bilateral perihilar airspace disease, greatest in the lower lobes, which could reflect widespread infection or edema. 3. Trace bilateral pleural effusions. 4. Aortic Atherosclerosis (ICD10-I70.0). Coronary artery atherosclerosis. 5. 4.1 cm ascending thoracic aortic aneurysm. Recommend annual imaging followup by CTA or MRA. This recommendation follows 2010 ACCF/AHA/AATS/ACR/ASA/SCA/SCAI/SIR/STS/SVM Guidelines for the Diagnosis and Management of Patients with Thoracic Aortic Disease. Circulation. 2010; 121: [id number]. Aortic aneurysm NOS (ICD10-I71.9) Abdomen/pelvis: 1. No acute intra-abdominal or intrapelvic process. Normal appendix. 2. Distal colonic diverticulosis without diverticulitis. 3. Enlarged prostate. 4.  Aortic Atherosclerosis (ICD10-I70.0). Electronically Signed   By: Bobbye Burrow M.D.   On: 09/14/2023 22:40   CT HEAD WO CONTRAST ( ) Result Date: 09/14/2023 CLINICAL DATA:  Head trauma, minor (Age >= 65y) EXAM: CT HEAD WITHOUT CONTRAST TECHNIQUE: Contiguous axial images were obtained from the base of the skull through the vertex without intravenous contrast. RADIATION DOSE REDUCTION: This exam was performed according to the departmental dose-optimization program which includes automated exposure control, adjustment  of the mA and/or kV according to patient size and/or use of iterative reconstruction technique. COMPARISON:  CT head 08/15/2022 FINDINGS: Brain: Bilateral anterior inferior frontal lobe and bilateral anterior temporal lobe encephalomalacia. Left cerebellar encephalomalacia. No evidence of large-territorial acute infarction. No parenchymal hemorrhage. No mass lesion. No extra-axial collection. No mass effect  or midline shift. No hydrocephalus. Basilar cisterns are patent. Vascular: No hyperdense vessel. Atherosclerotic calcifications are present within the cavernous internal carotid and vertebral arteries. Skull: No acute fracture or focal lesion. Old left occipital skull fracture. Sinuses/Orbits: Paranasal sinuses and mastoid air cells are clear. Bilateral lens replacement. Bilateral scleral buckle. Otherwise the orbits are unremarkable. Other: Partially visualized endotracheal tube. IMPRESSION: No acute intracranial abnormality. Electronically Signed   By: Morgane  Naveau M.D.   On: 09/14/2023 22:36   DG Abd Portable 1 View Result Date: 09/14/2023 CLINICAL DATA:  Enteric catheter placement EXAM: PORTABLE ABDOMEN - 1 VIEW COMPARISON:  None Available. FINDINGS: Frontal view of the lower chest and upper abdomen demonstrates enteric catheter passing below diaphragm tip projecting over gastric body. Interstitial and ground-glass opacities throughout the lungs consistent with edema. Unremarkable bowel gas pattern. IMPRESSION: 1. Enteric catheter tip projects over gastric body. Electronically Signed   By: Bobbye Burrow M.D.   On: 09/14/2023 20:55   DG Chest Port 1 View Result Date: 09/14/2023 CLINICAL DATA:  Intubated, CHF, tachypnea EXAM: PORTABLE CHEST 1 VIEW COMPARISON:  09/14/2023 FINDINGS: Single frontal view of the chest demonstrates endotracheal tube overlying tracheal air column, tip of proximally 2 cm above carina. Dual lead pacemaker/AICD unchanged. Cardiac silhouette remains mildly enlarged. Stable  interstitial and ground-glass opacities throughout the lungs. No large effusion or pneumothorax. No acute bony abnormalities. IMPRESSION: 1. No complication after intubation. 2. Stable findings of congestive heart failure. Electronically Signed   By: Bobbye Burrow M.D.   On: 09/14/2023 20:55   DG Chest Port 1 View Result Date: 09/14/2023 CLINICAL DATA:  Tachypnea.  CHF EXAM: PORTABLE CHEST 1 VIEW COMPARISON:  X-ray 01/14/2022 and older FINDINGS: Left upper chest battery pack for defibrillator with leads along the right side of the heart. Stable cardiopericardial silhouette. Tortuous aorta. Increasing vascular congestion and component of possible edema. No pneumothorax or effusion. No consolidation. Degenerative changes along the spine. Overlapping cardiac leads. IMPRESSION: Increasing vascular congestion and interstitial changes, possible edema. Defibrillator. Electronically Signed   By: Adrianna Horde M.D.   On: 09/14/2023 18:20    ECHO ordered  TELEMETRY reviewed by me 09/15/2023: Sinus rhythm, rate 80s  EKG reviewed by me: Sinus rhythm rate 93 bpm  Data reviewed by me 09/15/2023: last 24h vitals tele labs imaging I/O ED provider note, admission H&P.  Principal Problem:   Acute respiratory failure with hypoxia (HCC) Active Problems:   Sepsis (HCC)   Altered mental status   HFrEF (heart failure with reduced ejection fraction) (HCC)   Non-ST elevation MI (NSTEMI) (HCC)   Aspiration pneumonia of both lower lobes (HCC)    ASSESSMENT AND PLAN:  Blake [name] is a 73 y.o. male  with a past medical history of coronary artery disease s/p stent to LAD, ischemic cardiomyopathy s/p ICD ([employer]), chronic HFrEF, OSA (on CPAP), hx CVA who presented to the ED on 09/14/2023 for dyspnea, chest pain and AMS. Patient recently seen at urgent care for nonproductive cough, chills/diaphoresis Troponins found to be elevated.  Cardiology was consulted for further evaluation  # NSTEMI # Acute hypoxic  respiratory failure  # Acute metabolic encephalopathy  # Sepsis # Acute on chronic HFrEF # Ischemic cardiomyopathy s/p ICD Patient presented to ED with chest pain, dyspnea. Patient intubated for airway protection. BNP elevated at 570.  Troponins elevated and trending 100 > 7400 > 15300 > 15200.  EKG in ED with sinus tachycardia, rate 144 bpm.  Per telemetry patient remains in  sinus rhythm rate 80s.  Patient intubated and remains on Levophed. -Echo ordered. -Monitor and replenish electrolytes for a goal K >4, Mag >2. -Continue heparin  gtt for medical management for NSTEMI. -Continue aspirin  81 mg per tube daily. -Consider resuming home GDMT once patient off pressors and hemodynamically stable. -Consider LHC when patient off pressors and hemodynamically stable. -Acute hypoxic respiratory failure, acute metabolic encephalopathy, sepsis management per primary.  TIMI Risk Score for Unstable Angina or Non-ST Elevation MI:   The patient's TIMI risk score is 5, which indicates a 26% risk of all cause mortality, new or recurrent myocardial infarction or need for urgent revascularization in the next 14 days.    This patient's plan of care was discussed and created with Dr. Custovic and she is in agreement.  Signed: Creighton Doffing, PA-C  09/15/2023, 4:54 PM [facility]

## 2023-09-15 NOTE — Procedures (Signed)
 CENTRAL VENOUS CATHETER INSERTION PROCEDURE NOTE  Blake [name]  [id number]  [birth year]  Date:09/15/23  Time:3:06 AM   Provider Performing:Riely Oetken A [name]   Procedure: Insertion of Non-tunneled Central Venous Catheter(36556) with US  guidance (40981)   Indication(s) Medication administration and Difficult access  Consent Risks of the procedure as well as the alternatives and risks of each were explained to the patient and/or caregiver.  Consent for the procedure was obtained and is signed in the bedside chart  Anesthesia Topical only with 1% lidocaine    Timeout Verified patient identification, verified procedure, site/side was marked, verified correct patient position, special equipment/implants available, medications/allergies/relevant history reviewed, required imaging and test results available.  Sterile Technique Maximal sterile technique including full sterile barrier drape, hand hygiene, sterile gown, sterile gloves, mask, hair covering, sterile ultrasound probe cover (if used).  Procedure Description Area of catheter insertion was cleaned with chlorhexidine  and draped in sterile fashion.  With real-time ultrasound guidance a central venous catheter was placed into the right internal jugular vein. Nonpulsatile blood flow and easy flushing noted in all ports.  The catheter was sutured in place and sterile dressing applied.  Complications/Tolerance None; patient tolerated the procedure well. Chest X-ray is ordered to verify placement for internal jugular or subclavian cannulation.   Chest x-ray is not ordered for femoral cannulation.  EBL Minimal  Specimen(s) None   Alonza Arthurs, DNP, CCRN, FNP-C, AGACNP-BC Acute Care & Family Nurse Practitioner  [facility]  See Amion for personal pager PCCM on call pager [phone] until 7 am

## 2023-09-15 NOTE — Consult Note (Signed)
 PHARMACY CONSULT NOTE - ELECTROLYTES  Pharmacy Consult for Electrolyte Monitoring and Replacement   Recent Labs: Potassium (mmol/L)  Date Value  09/15/2023 2.8 (L)  05/23/2014 4.1   Magnesium  (mg/dL)  Date Value  09/81/1914 2.2  09/13/2013 1.9   Calcium  (mg/dL)  Date Value  78/29/5621 7.7 (L)   Calcium , Total (mg/dL)  Date Value  30/86/5784 8.5   Albumin (g/dL)  Date Value  69/62/9528 3.1 (L)  05/23/2014 3.7   Phosphorus (mg/dL)  Date Value  41/32/4401 2.4 (L)   Sodium (mmol/L)  Date Value  09/15/2023 136  05/23/2014 143   Height: 5\' 6"  (167.6 cm) Weight: 91 kg (200 lb 9.9 oz) IBW/kg (Calculated) : 63.8 Estimated Creatinine Clearance: 64.2 mL/min (by C-G formula based on SCr of 1.05 mg/dL).  Assessment  Blake [name] is a 73 y.o. male presenting with acute respiratory failure and shock. 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 Pharmacy has been consulted to monitor and replace electrolytes.  Diet: NPO, intubated MIVF: N/A Pertinent medications: N/A  Goal of Therapy: Electrolytes within normal limits  Plan:  K 2.8, Kcl 10 mEq IV q1h x 3 and Kcl 40 mEq BID x 2 doses Re-check BMP at 1700 and all electrolytes with AM labs tomorrow  Thank you for allowing pharmacy to be a part of this patient's care.  Page Boast 09/15/2023 11:59 AM

## 2023-09-15 NOTE — Progress Notes (Signed)
 PHARMACIST - PHYSICIAN COMMUNICATION   CONCERNING: Hydrocortisone (Solu-Cortef) IV    Current order: Hydrocortisone (Solu-Cortef) IV 50mg  IV every 6 hours     DESCRIPTION: Per Petrey Protocol:   IV methylprednisolone  will be converted to either a q12h or q24h frequency with the same total daily dose (TDD).  Ordered Dose: 1 to 125 mg TDD; convert to: TDD q24h.  Ordered Dose: 126 to 250 mg TDD; convert to: TDD div q12h.  Ordered Dose: >250 mg TDD; DAW.  Order has been adjusted to: Hydrocortisone (Solu-Cortef) IV  100mg  every 12 hours   Malone Sear , PharmD, BCPS Clinical Pharmacist  09/15/2023 8:09 AM

## 2023-09-15 NOTE — Progress Notes (Signed)
 Pharmacy Antibiotic Note  Blake [name] is a 73 y.o. male admitted on 09/14/2023 with meningitis.  Pharmacy has been consulted for Vancomycin & Acyclovir dosing for 14 days.  Plan: Pt given Vancomycin 2000 mg once. Vancomycin 1750 mg IV Q 24 hrs. Goal AUC 400-550. Expected AUC: 526.2 SCr used: 1.03  Acyclovir 10 mg/kg (TBW) q8hrs  Pharmacy will continue to follow and will adjust abx dosing whenever warranted.  Temp (24hrs), Avg:101.3 F (38.5 C), Min:85.6 F (29.8 C), Max:103.3 F (39.6 C)   Recent Labs  Lab 09/14/23 1835 09/14/23 2120 09/15/23 0128  WBC 9.3  --   --   CREATININE 1.03  --   --   LATICACIDVEN 1.5 2.6* 1.4    Estimated Creatinine Clearance: 65.6 mL/min (by C-G formula based on SCr of 1.03 mg/dL).    Allergies  Allergen Reactions   Mirtazapine Other (See Comments)    Other reaction(s): Other (See Comments) Irritability/anger, increased appetite, worsening depression Irritability/anger, increased appetite, worsening depression     Antimicrobials this admission: 5/26 Acyclovir >>  5/26 Ampicillin >> x 14 days 5/26 Ceftriaxone >> x 14 days 5/26 Vancomycin >> x 14 days  Microbiology results: 5/26 BCx: Pending 5/26 UCx: Pending  5/26 CSFCx: Sent  Thank you for allowing pharmacy to be a part of this patient's care.  Coretta Dexter, PharmD, MBA 09/15/2023 2:03 AM

## 2023-09-15 NOTE — Progress Notes (Signed)
 ANTICOAGULATION CONSULT NOTE  Pharmacy Consult for Heparin  Infusion Indication: ACS/STEMI  Patient Measurements: Height: 5\' 6"  (167.6 cm) Weight: 91 kg (200 lb 9.9 oz) IBW/kg (Calculated) : 63.8 HEPARIN  DW (KG): 82.2 Heparin  Dosing Weight: 84.2 kg  Labs: Recent Labs    09/14/23 1835 09/15/23 0128 09/15/23 0244 09/15/23 0308 09/15/23 0612 09/15/23 0812 09/15/23 1007 09/15/23 1226 09/15/23 1646 09/15/23 2058  HGB 15.6  --  13.8  --   --   --   --   --   --   --   HCT 45.0  --  40.3  --   --   --   --   --   --   --   PLT 222  --  178  --  172  --   --   --   --   --   APTT  --   --  29  --   --   --   --   --   --   --   LABPROT  --   --  15.6*  --   --   --   --   --   --   --   INR  --   --  1.2  --   --   --   --   --   --   --   HEPARINUNFRC  --   --   --   --   --   --   --  0.30  --  0.20*  CREATININE 1.03  --   --  1.15 1.05  --   --   --  1.08  --   CKTOTAL 136  --   --  612*  --   --   --   --   --   --   TROPONINIHS 106* 7,453*  --   --   --  15,306* 15,207*  --   --   --     Estimated Creatinine Clearance: 62.4 mL/min (by C-G formula based on SCr of 1.08 mg/dL).   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   Coronary artery disease    Dementia (HCC)    Depression    Disorder of male genital organs 07/11/2013   Dyspnea    Encounter for long-term (current) use of medications 11/21/2014   Erectile dysfunction 07/11/2013   Executive function deficit 03/06/2016   GERD (gastroesophageal reflux disease)    Headache    stress   Hearing loss    Hyperlipidemia    Hypertension     Hypogonadism, 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   Major depression, recurrent, chronic (HCC) 03/06/2016   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   Mixed hyperlipidemia 02/02/2014   Myocardial infarction (HCC) 08/2013   OCD (obsessive compulsive disorder)    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   Other specified disorder of male genital organs(608.89) 07/11/2013   Prostatic hypertrophy    Pseudophakia of both eyes 04/20/2015  Raynaud's disease    Reduced libido 07/11/2013   Repeated falls    weak left ankle   Scoliosis    Stroke (HCC) 2/16   "light"   Synovitis    knees, ankles   Assessment: Pt is a 73 yo male presenting to ED c/o dyspnea and CP, being treated for possible meningitis, now found with elevated Troponin I level, trending up.  Goal of Therapy:  Heparin  level 0.3-0.7 units/ml Monitor platelets by anticoagulation protocol: Yes   Plan:  5/27@2058 : HL 0.20, SUBtherapeutic --Bolus 1250 units x 1 --Increase heparin  infusion to 1200 units/hr --Recheck HL in 8 hours after rate change --Daily CBC per protocol while on IV heparin   Kalilah Barua A Gabrial Domine 09/15/2023 9:26 PM

## 2023-09-15 NOTE — Procedures (Signed)
 ARTERIAL CATHETER INSERTION PROCEDURE NOTE  Blake [name]  [id number]  12/03/1950  Date:09/15/23  Time:3:08 AM    Provider Performing: Steffi Edu   Procedure: Insertion of Arterial Line (14782) with US  guidance (95621)   Indication(s) Blood pressure monitoring and/or need for frequent ABGs  Consent Risks of the procedure as well as the alternatives and risks of each were explained to the patient and/or caregiver.  Consent for the procedure was obtained and is signed in the bedside chart  Anesthesia None  Time Out Verified patient identification, verified procedure, site/side was marked, verified correct patient position, special equipment/implants available, medications/allergies/relevant history reviewed, required imaging and test results available.  Sterile Technique Maximal sterile technique including full sterile barrier drape, hand hygiene, sterile gown, sterile gloves, mask, hair covering, sterile ultrasound probe cover (if used).  Procedure Description Area of catheter insertion was cleaned with chlorhexidine  and draped in sterile fashion. With real-time ultrasound guidance an arterial catheter was placed into the right radial artery.  Appropriate arterial tracings confirmed on monitor.    Complications/Tolerance None; patient tolerated the procedure well.  EBL Minimal  Specimen(s) None   Alonza Arthurs, DNP, CCRN, FNP-C, AGACNP-BC Acute Care & Family Nurse Practitioner  [facility]  See Amion for personal pager PCCM on call pager [phone] until 7 am

## 2023-09-15 NOTE — Progress Notes (Signed)
 ANTICOAGULATION CONSULT NOTE  Pharmacy Consult for Heparin  Infusion Indication: ACS/STEMI  Patient Measurements: Height: 5\' 6"  (167.6 cm) Weight: 91 kg (200 lb 9.9 oz) IBW/kg (Calculated) : 63.8 HEPARIN  DW (KG): 82.2 Heparin  Dosing Weight: 84.2 kg  Labs: Recent Labs    09/14/23 1835 09/15/23 0128 09/15/23 0244 09/15/23 0308 09/15/23 0612 09/15/23 0812 09/15/23 1007 09/15/23 1226  HGB 15.6  --  13.8  --   --   --   --   --   HCT 45.0  --  40.3  --   --   --   --   --   PLT 222  --  178  --  172  --   --   --   APTT  --   --  29  --   --   --   --   --   LABPROT  --   --  15.6*  --   --   --   --   --   INR  --   --  1.2  --   --   --   --   --   HEPARINUNFRC  --   --   --   --   --   --   --  0.30  CREATININE 1.03  --   --  1.15 1.05  --   --   --   CKTOTAL 136  --   --  612*  --   --   --   --   TROPONINIHS 106* 7,453*  --   --   --  15,306* 15,207*  --     Estimated Creatinine Clearance: 64.2 mL/min (by C-G formula based on SCr of 1.05 mg/dL).   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   Coronary artery disease    Dementia (HCC)    Depression    Disorder of male genital organs 07/11/2013   Dyspnea    Encounter for long-term (current) use of medications 11/21/2014   Erectile dysfunction 07/11/2013   Executive function deficit 03/06/2016   GERD (gastroesophageal reflux disease)    Headache    stress   Hearing loss    Hyperlipidemia    Hypertension    Hypogonadism, 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   Major depression,  recurrent, chronic (HCC) 03/06/2016   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   Mixed hyperlipidemia 02/02/2014   Myocardial infarction (HCC) 08/2013   OCD (obsessive compulsive disorder)    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   Other specified disorder of male genital organs(608.89) 07/11/2013   Prostatic hypertrophy    Pseudophakia of both eyes 04/20/2015   Raynaud's disease    Reduced libido 07/11/2013   Repeated falls    weak left ankle   Scoliosis    Stroke (HCC) 2/16   "light"   Synovitis    knees, ankles   Assessment: Pt is a 73 yo male presenting to ED c/o dyspnea and CP, being treated for possible  meningitis, now found with elevated Troponin I level, trending up.  Goal of Therapy:  Heparin  level 0.3-0.7 units/ml Monitor platelets by anticoagulation protocol: Yes   Plan:  --Heparin  level is therapeutic x 1 --Continue heparin  infusion at 1000 units/hr --Re-check confirmatory HL in 8 hours --Daily CBC per protocol while on IV heparin   Page Boast 09/15/2023 1:00 PM

## 2023-09-15 NOTE — Progress Notes (Signed)
 Initial Nutrition Assessment  DOCUMENTATION CODES:   Obesity unspecified  INTERVENTION:   -If unable to extubate in 48 hours, recommend:  Initiate Vital High Protein @ 20 ml/hr via OGT and increase by 10 ml every 4 hours to goal rate of 40 ml/hr.   60 ml Prosource TF20 TID   30 ml free water  flush every 4 hours  Tube feeding regimen provides 1200 kcal (100% of needs), 144 grams of protein, and 803 ml of H2O. Total free water : 983 ml daily   NUTRITION DIAGNOSIS:   Inadequate oral intake related to inability to eat as evidenced by NPO status.  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   Patient is currently intubated on ventilator support. OGT currently clamped. Per KUB on 09/14/23, tip of tube in gastric body.  MV: 9.1 L/min Temp (24hrs), Avg:99.7 F (37.6 C), Min:85.6 F (29.8 C), Max:103.3 F (39.6 C)  Propofol : 5.4 ml/hr (provides 143 kcals daily)   Reviewed I/O's: +2.2 L x 24 hours  UOP: 500 ml x 24 hours  Case discussed with RN, who reports that pt active despite neurological history (went to church PTA). Pt had been having  chills, body aches, and cough PTA and was seen at urgent care and diagnosed with URI and prescribed a small dose of OMNICEF. Per RN, MD suspects current state likely related to pneumonia on top of rhinovirus. Some home medications held to determine if side effects may have contributed to current acute illness. Per RN, possible plan to extubate tomorrow, however, will likely start TF tomorrow if unable to extubate.   Wt has been stable over the past 2 years.   Medications reviewed and include colace, solu-cortef, protonix , miralx, potassium chloride , levophed, and propofol .   Labs reviewed: K: 2.8 (on supplementation), Phos: 2.4, CBGS: 124 (inpatient orders for glycemic control are none).    NUTRITION - FOCUSED PHYSICAL EXAM:  Flowsheet Row Most Recent Value  Orbital Region No depletion  Upper Arm Region No depletion  Thoracic and Lumbar Region No depletion  Buccal Region No depletion  Temple Region No depletion  Clavicle Bone Region No depletion  Clavicle and Acromion Bone Region No depletion  Scapular Bone Region No depletion  Dorsal Hand No depletion  Patellar Region No depletion  Anterior Thigh Region No depletion  Posterior Calf Region No depletion  Edema (RD Assessment) None  Hair Reviewed  Eyes Reviewed  Mouth Reviewed  Skin Reviewed  Nails Reviewed       Diet Order:   Diet Order             Diet NPO time specified  Diet effective now  EDUCATION NEEDS:   Not appropriate for education at this time  Skin:  Skin Assessment: Reviewed RN Assessment  Last BM:  Unknown  Height:   Ht Readings from Last 1 Encounters:  09/14/23 5\' 6"  (1.676 m)    Weight:   Wt Readings from Last 1 Encounters:  09/15/23 91 kg    Ideal Body Weight:  64.5 kg  BMI:  Body mass index is 32.38 kg/m.  Estimated Nutritional Needs:   Kcal:  1610-9604  Protein:  > 129 grams  Fluid:  1-1.3 L    [name], RD, LDN, CDCES Registered Dietitian  III Certified Diabetes Care and Education Specialist If unable to reach this RD, please use "RD Inpatient" group chat on secure chat between hours of 8am-4 pm daily

## 2023-09-15 NOTE — Progress Notes (Signed)
 ANTICOAGULATION CONSULT NOTE  Pharmacy Consult for heparin  infusion Indication: ACS/STEMI  Allergies  Allergen Reactions   Mirtazapine Other (See Comments)    Other reaction(s): Other (See Comments) Irritability/anger, increased appetite, worsening depression Irritability/anger, increased appetite, worsening depression     Patient Measurements: Weight: 88 kg (194 lb 0.1 oz) Heparin  Dosing Weight: 84.2 kg  Vital Signs: Temp: 99.7 F (37.6 C) (05/27 0225) Temp Source: Bladder (05/26 2300) BP: 106/71 (05/27 0225) Pulse Rate: 94 (05/27 0225)  Labs: Recent Labs    09/14/23 1835 09/15/23 0128  HGB 15.6  --   HCT 45.0  --   PLT 222  --   CREATININE 1.03  --   CKTOTAL 136  --   TROPONINIHS 106* 7,453*    Estimated Creatinine Clearance: 65.6 mL/min (by C-G formula based on SCr of 1.03 mg/dL).   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   Coronary artery disease    Dementia (HCC)    Depression    Disorder of male genital organs 07/11/2013   Dyspnea    Encounter for long-term (current) use of medications 11/21/2014   Erectile dysfunction 07/11/2013   Executive function deficit 03/06/2016   GERD (gastroesophageal reflux disease)    Headache    stress   Hearing loss    Hyperlipidemia    Hypertension    Hypogonadism, 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   Major depression, recurrent, chronic (HCC) 03/06/2016   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   Mixed hyperlipidemia 02/02/2014   Myocardial  infarction (HCC) 08/2013   OCD (obsessive compulsive disorder)    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   Other specified disorder of male genital organs(608.89) 07/11/2013   Prostatic hypertrophy    Pseudophakia of both eyes 04/20/2015   Raynaud's disease    Reduced libido 07/11/2013   Repeated falls    weak left ankle   Scoliosis    Stroke (HCC) 2/16   "light"   Synovitis    knees, ankles    Assessment: Pt is a 73 yo male presenting to ED c/o dyspnea and CP, being treated for possible meningitis, now found with elevated Troponin I level, trending up.  Goal of Therapy:  Heparin  level 0.3-0.7 units/ml Monitor platelets by anticoagulation protocol: Yes   Plan:  Bolus 4000 units x 1 Start heparin  infusion at 1100 units/hr Will check HL in 8 hr after start of infusion CBC daily while on heparin   Coretta Dexter, PharmD, [facility] 09/15/2023 2:52 AM

## 2023-09-16 DIAGNOSIS — J690 Pneumonitis due to inhalation of food and vomit: Secondary | ICD-10-CM

## 2023-09-16 DIAGNOSIS — I5023 Acute on chronic systolic (congestive) heart failure: Secondary | ICD-10-CM | POA: Diagnosis not present

## 2023-09-16 DIAGNOSIS — G928 Other toxic encephalopathy: Secondary | ICD-10-CM

## 2023-09-16 DIAGNOSIS — J9601 Acute respiratory failure with hypoxia: Secondary | ICD-10-CM | POA: Diagnosis not present

## 2023-09-16 LAB — URINE CULTURE: Culture: NO GROWTH

## 2023-09-16 LAB — CBC
HCT: 39.2 % (ref 39.0–52.0)
Hemoglobin: 12.8 g/dL — ABNORMAL LOW (ref 13.0–17.0)
MCH: 31.4 pg (ref 26.0–34.0)
MCHC: 32.7 g/dL (ref 30.0–36.0)
MCV: 96.1 fL (ref 80.0–100.0)
Platelets: 163 10*3/uL (ref 150–400)
RBC: 4.08 MIL/uL — ABNORMAL LOW (ref 4.22–5.81)
RDW: 14.5 % (ref 11.5–15.5)
WBC: 13.3 10*3/uL — ABNORMAL HIGH (ref 4.0–10.5)
nRBC: 0 % (ref 0.0–0.2)

## 2023-09-16 LAB — LEGIONELLA PNEUMOPHILA SEROGP 1 UR AG: L. pneumophila Serogp 1 Ur Ag: NEGATIVE

## 2023-09-16 LAB — ECHOCARDIOGRAM COMPLETE
Area-P 1/2: 5.14 cm2
Height: 66 in
P 1/2 time: 376 ms
S' Lateral: 4.8 cm
Weight: 3209.9 [oz_av]

## 2023-09-16 LAB — HEPARIN LEVEL (UNFRACTIONATED)
Heparin Unfractionated: 0.22 [IU]/mL — ABNORMAL LOW (ref 0.30–0.70)
Heparin Unfractionated: 0.37 [IU]/mL (ref 0.30–0.70)
Heparin Unfractionated: 0.22 [IU]/mL — ABNORMAL LOW (ref 0.30–0.70)

## 2023-09-16 LAB — GLUCOSE, CAPILLARY
Glucose-Capillary: 101 mg/dL — ABNORMAL HIGH (ref 70–99)
Glucose-Capillary: 107 mg/dL — ABNORMAL HIGH (ref 70–99)
Glucose-Capillary: 116 mg/dL — ABNORMAL HIGH (ref 70–99)
Glucose-Capillary: 122 mg/dL — ABNORMAL HIGH (ref 70–99)
Glucose-Capillary: 143 mg/dL — ABNORMAL HIGH (ref 70–99)
Glucose-Capillary: 149 mg/dL — ABNORMAL HIGH (ref 70–99)

## 2023-09-16 LAB — BASIC METABOLIC PANEL WITH GFR
Anion gap: 9 (ref 5–15)
BUN: 18 mg/dL (ref 8–23)
CO2: 20 mmol/L — ABNORMAL LOW (ref 22–32)
Calcium: 6.9 mg/dL — ABNORMAL LOW (ref 8.9–10.3)
Chloride: 111 mmol/L (ref 98–111)
Creatinine, Ser: 0.88 mg/dL (ref 0.61–1.24)
GFR, Estimated: >60 mL/min (ref >60)
Glucose, Bld: 136 mg/dL — ABNORMAL HIGH (ref 70–99)
Potassium: 4.4 mmol/L (ref 3.5–5.1)
Sodium: 140 mmol/L (ref 135–145)

## 2023-09-16 LAB — HEPATIC FUNCTION PANEL
ALT: 26 U/L (ref 0–44)
AST: 59 U/L — ABNORMAL HIGH (ref 15–41)
Albumin: 2.8 g/dL — ABNORMAL LOW (ref 3.5–5.0)
Alkaline Phosphatase: 34 U/L — ABNORMAL LOW (ref 38–126)
Bilirubin, Direct: <0.1 mg/dL (ref 0.0–0.2)
Total Bilirubin: 0.5 mg/dL (ref 0.0–1.2)
Total Protein: 5.5 g/dL — ABNORMAL LOW (ref 6.5–8.1)

## 2023-09-16 LAB — PHOSPHORUS: Phosphorus: 3 mg/dL (ref 2.5–4.6)

## 2023-09-16 LAB — MAGNESIUM: Magnesium: 2.3 mg/dL (ref 1.7–2.4)

## 2023-09-16 MED ORDER — HALOPERIDOL LACTATE 5 MG/ML IJ SOLN
5.0000 mg | Freq: Once | INTRAMUSCULAR | Status: AC
  Administered 2023-09-16: 5 mg via INTRAVENOUS
  Filled 2023-09-16: qty 1

## 2023-09-16 MED ORDER — DIAZEPAM 5 MG/ML IJ SOLN
10.0000 mg | Freq: Once | INTRAMUSCULAR | Status: AC
  Administered 2023-09-16: 10 mg via INTRAVENOUS

## 2023-09-16 MED ORDER — PROSOURCE TF20 ENFIT COMPATIBL EN LIQD
60.0000 mL | Freq: Every day | ENTERAL | Status: DC
  Filled 2023-09-16: qty 60

## 2023-09-16 MED ORDER — HALOPERIDOL LACTATE 5 MG/ML IJ SOLN
2.5000 mg | Freq: Four times a day (QID) | INTRAMUSCULAR | Status: DC | PRN
  Filled 2023-09-16: qty 1

## 2023-09-16 MED ORDER — VITAL AF 1.2 CAL PO LIQD: 1000.0000 mL | ORAL | Status: DC

## 2023-09-16 MED ORDER — DEXMEDETOMIDINE HCL IN NACL 400 MCG/100ML IV SOLN
0.0000 ug/kg/h | INTRAVENOUS | Status: DC
  Administered 2023-09-16: 0.4 ug/kg/h via INTRAVENOUS
  Filled 2023-09-16 (×8): qty 100

## 2023-09-16 MED ORDER — HEPARIN BOLUS VIA INFUSION
1250.0000 [IU] | Freq: Once | INTRAVENOUS | Status: AC
  Administered 2023-09-16: 1250 [IU] via INTRAVENOUS
  Filled 2023-09-16: qty 1250

## 2023-09-16 MED ORDER — VALPROATE SODIUM 100 MG/ML IV SOLN
250.0000 mg | Freq: Three times a day (TID) | INTRAVENOUS | Status: DC
  Administered 2023-09-16 – 2023-09-17 (×2): 250 mg via INTRAVENOUS
  Filled 2023-09-16 (×3): qty 2.5

## 2023-09-16 MED ORDER — FENTANYL CITRATE PF 50 MCG/ML IJ SOSY: 25.0000 ug | PREFILLED_SYRINGE | INTRAMUSCULAR | Status: DC | PRN

## 2023-09-16 MED ORDER — DIAZEPAM 5 MG/ML IJ SOLN
INTRAMUSCULAR | Status: AC
  Filled 2023-09-16: qty 2

## 2023-09-16 MED ORDER — FREE WATER: 30.0000 mL | Status: DC

## 2023-09-16 MED ORDER — NOREPINEPHRINE 16 MG/250ML-% IV SOLN
0.0000 ug/min | INTRAVENOUS | Status: DC
  Administered 2023-09-16: 2 ug/min via INTRAVENOUS
  Filled 2023-09-16: qty 250

## 2023-09-16 NOTE — Progress Notes (Signed)
 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   Per ed reports, and patient's wife who is at the bedside, Patient was seen at the urgent care for chief complaints of chills, body aches, intermittently and nonproductive coughing fits. He was diagnosed with acute upper respiratory infection and was sent home with promethazine-dextromethorphan and cefdinir (OMNICEF). [name] report that he took the prescription and went to bed. Later he woke up altered and delusional and c/o CP and SOB. He was noted to be sweating profusely with abnormal body temp per wife.   ED Course: Initial vital signs showed HR >180 beats/minute, BP >160 mm Hg, the RR 30s-40s breaths/minute, and the oxygen saturation 80s% on NRB and a temperature of 103.41F (39.6C).    Pertinent Labs/Diagnostics Findings: Na+/ K+:138/3.9  Glucose:127 CO2 21 WBC: unremarkable Lactic acid: 1.5~2.6 CK: COVID PCR: Negative troponin:106~7453   BNP:573.5  VBG: pO2 pend; pCO2 37; pH 7.49;  HCO3 28.2, %O2 Sat 43.7.  CXR> CTH> CTA Chest> CT Abd/pelvis>see report   Patient's agitation and restlessness worsen with HR up in the 170s to 180s.  2 mg of Ativan was trialed but patient did not  calm down. Given worsening symptoms and high risk for decompensation, patient intubated for airway protection. He was started on broad-spectrum antibiotics Ampicillin, vanc, Ceftriaxone and Acyclovir due to unclear source for possible infection and cover broadly to include treatment for meningitis. PCCM consulted for admission.  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 and fell off, then he got back onto the horse and fell again on asphalt), HFrEF, ischemic cardiomyopathy, Hyperlipidemia, SVT, CAD, Depression, GERD, Hearing loss of both ears, MI, CVA, Hypertension, Hypotestosteronemia, ICD in place [facility] [employer] [id number] DEFIBRILLATOR, RESONATE EL DR DF4 S/N: [phone] implanted 11/21/2021), and OSA on CPAP     Significant Hospital Events: Including procedures, antibiotic start and stop dates in addition to other pertinent events   5/26: Admitted to ICU with acute altered mental status and respiratory failure in the setting of suspected NMS versus sepsis requiring intubation 5/27: fever improved, continued on vasopressors 5/28: afebrile, pressor requirements improved   Interim History / Subjective:  Sedated and intubated, remains vented.  Objective    Blood pressure 117/73, pulse 79, temperature 99 F (37.2 C), resp. rate 20, height 5\' 6"  (1.676 m), weight 91.1 kg, SpO2 100%.    Vent Mode: PRVC FiO2 (%):  [50 %-60 %] 50 % Set Rate:  [20 bmp] 20 bmp Vt Set:  [500 mL] 500 mL PEEP:  [8 cmH20] 8 cmH20 Plateau Pressure:  [20 cmH20-22 cmH20] 21 cmH20   Intake/Output Summary (Last 24 hours) at 09/16/2023 0930 Last data filed at 09/16/2023 [phone]  Gross per 24 hour  Intake 1737.95 ml  Output 1560 ml  Net 177.95 ml   Filed Weights   09/14/23 2300 09/15/23 0352 09/16/23 0342  Weight: 88 kg 91 kg 91.1 kg    Examination:  Physical  Exam Constitutional:      General: He is not in acute distress.    Appearance: He is ill-appearing.  Cardiovascular:     Rate and Rhythm: Normal rate and regular rhythm.     Pulses: Normal pulses.     Heart sounds: Normal heart sounds.  Pulmonary:     Breath sounds: No wheezing or rales.     Comments: Ventilated breath sounds Neurological:     Mental Status: He is disoriented.     Assessment and Plan   73 year old male presenting with a few days' history of shortness of breath, viral symptoms, and encephalopathy. He was intubated on presentation to the ED due to agitation for airway protection and admitted to the ICU for further management.   #Acute Hypoxic Respiratory Failure #Toxic Metabolic Encephalopathy  #Fronto-temporal Dementia #Rhinovirus Infection #Aspiration Pneumonia  #Community Acquired Pneumonia #Acute on Chronic HFrEF #Type II NSTEMI  #CAD s/p PCI to LAD #Circulatory Shock  #Severe Sepsis #OSA on CPAP   Neuro - Propofol  and fentanyl  for analgosedation, RASS -1, holding home valproic acid  and duloxetine . Report of promethazine-dextromethorphan but wife reports only minimal ingestion (1/4 spoon once). Symptoms less likely to represent NMS or meningitis. He is on clopidogrel  and the risk of LP outweighs the benefits. Will proceed with WUA today.   CV - history of HFrEF, on GDMT, and CAD with PCI to LAD. Presents with significant elevation in troponin, which represents NSTEMI secondary to shock and critical illness. This could also be secondary to decompensated heart failure (elevated BNP, interlobular septal thickening). He is on heparin  gtt and maintained on nor-epinephrine  for vasopressor support, with goal MAP of > 65 mmHg. Lactic acid is within normal. Will consult cardiology for further recommendations. TTE ordered. Holding diuresis for now pending stabilization of hemodynamics. LHC planned by cardiology after stabilization of shock and resolution of respiratory  failure.   Pulm - baseline OSA followed by [facility], on CPAP. Now intubated with respiratory failure secondary to rhinovirus with superimposed bacterial pneumonia (consolidations noted in the bilateral lower lobes). Also noted interlobular septal thickening on my review of the chest CT suggestive of concomitant decompensated heart failure. Continued with ventilatory support, with improved vent settings. Will attempt SBT today with FiO2 of 40% and PEEP of 5.   GI - SUP with PPI, will initiate tube feeds today.   Renal - kidney function at baseline and improved. Avoiding nephrotoxins.   Endo - ICU glycemic protocol, on hydrocortisone for adjunct treatment of severe pneumonia.   Hem/Onc - on heparin  gtt for ACS   ID - positive for rhinovirus, has bilateral consolidations in the lower lobes consistent with CAP. Continue with broad spectrum antibiotics, will d/c acyclovir and ampicillin given low index of suspicion for meningitis.  Best Practice (right click and "Reselect all SmartList Selections" daily)   Diet/type: tubefeeds DVT prophylaxis systemic heparin  Pressure ulcer(s): N/A GI prophylaxis: PPI Lines: Central line and Arterial Line Foley:  Yes, and it is still needed Code Status:  full code Last date of multidisciplinary goals of care discussion [09/16/2023]  Labs   CBC: Recent Labs  Lab 09/14/23 1835 09/15/23 0244 09/15/23 0612 09/16/23 0359  WBC 9.3 6.6  --  13.3*  NEUTROABS 7.5  --   --   --  HGB 15.6 13.8  --  12.8*  HCT 45.0 40.3  --  39.2  MCV 91.8 92.9  --  96.1  PLT 222 178 172 163    Basic Metabolic Panel: Recent Labs  Lab 09/14/23 1835 09/15/23 0308 09/15/23 0612 09/15/23 1646 09/16/23 0359  NA 138 138 136 138 140  K 3.9 3.6 2.8* 5.0 4.4  CL 105 107 107 109 111  CO2 21* 18* 20* 22 20*  GLUCOSE 127* 135* 145* 147* 136*  BUN 15 18 17 17 18   CREATININE 1.03 1.15 1.05 1.08 0.88  CALCIUM  8.9 7.5* 7.7* 7.7* 6.9*  MG 1.8  --  2.2  --  2.3  PHOS  --    --  2.4*  --  3.0   GFR: Estimated Creatinine Clearance: 76.6 mL/min (by C-G formula based on SCr of 0.88 mg/dL). Recent Labs  Lab 09/14/23 1835 09/14/23 2120 09/15/23 0128 09/15/23 0244 09/15/23 0614 09/16/23 0359  PROCALCITON  --   --   --   --  3.43  --   WBC 9.3  --   --  6.6  --  13.3*  LATICACIDVEN 1.5 2.6* 1.4  --   --   --     Liver Function Tests: Recent Labs  Lab 09/14/23 1835 09/15/23 0308 09/16/23 0359  AST 25 76* 59*  ALT 24 26 26   ALKPHOS 55 40 34*  BILITOT 0.9 0.8 0.5  PROT 7.2 5.8* 5.5*  ALBUMIN 4.0 3.1* 2.8*   No results for input(s): "LIPASE", "AMYLASE" in the last 168 hours. No results for input(s): "AMMONIA" in the last 168 hours.  ABG    Component Value Date/Time   PHART 7.36 09/15/2023 1100   PCO2ART 38 09/15/2023 1100   PO2ART 85 09/15/2023 1100   HCO3 21.5 09/15/2023 1100   ACIDBASEDEF 3.6 (H) 09/15/2023 1100   O2SAT 98 09/15/2023 1100     Coagulation Profile: Recent Labs  Lab 09/15/23 0244  INR 1.2    Cardiac Enzymes: Recent Labs  Lab 09/14/23 1835 09/15/23 0308  CKTOTAL 136 612*    HbA1C: No results found for: "HGBA1C"  CBG: Recent Labs  Lab 09/15/23 1613 09/15/23 1923 09/15/23 2316 09/16/23 0327 09/16/23 0732  GLUCAP 142* 114* 115* 116* 107*    Review of Systems:   N/A  Past Medical History:  He,  has a past medical history of 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), Asperger's syndrome, 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), CAD (coronary artery disease) (09/21/2013), 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), Chronic left shoulder pain (04/03/2016), 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), Cognitive deficit as late effect of traumatic brain injury (HCC) (03/06/2016), Coronary artery disease, Dementia (HCC), Depression, Disorder of male genital organs (07/11/2013), Dyspnea, Encounter for long-term (current) use of medications (11/21/2014), Erectile dysfunction (07/11/2013), Executive function deficit  (03/06/2016), GERD (gastroesophageal reflux disease), Headache, Hearing loss, Hyperlipidemia, Hypertension, Hypogonadism, male (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   Surgical History:   Past Surgical History:  Procedure Laterality Date   BACK SURGERY  2011   rods and screws   CLOSED REDUCTION NASAL FRACTURE Bilateral 06/11/2018   Procedure: CLOSED REDUCTION NASAL FRACTURE;  Surgeon: [name], MD;  Location: [facility] OR[facility];  Service: ENT;  Laterality: Bilateral;   COLONOSCOPY  2015   CORONARY ANGIOPLASTY     2015 stent   CORONARY STENT INTERVENTION N/A 06/08/2019   Procedure: CORONARY STENT INTERVENTION;  Surgeon: Antonette Batters, MD;  Location: [facility] IN[facility];  Service: Cardiovascular;  Laterality: N/A;   CYSTOSCOPY  1984   EYE SURGERY Bilateral 2005,2006,2009   detached retina, cataract   FOOT ARTHRODESIS Left 05/30/2015   Procedure: ARTHRODESIS FOOT STJ LT FOOT;  Surgeon: Anell Baptist, DPM;  Location: [facility];  Service: Podiatry;  Laterality: Left;  WITH POPLITEAL BLOCK   FOOT SURGERY Left    HERNIA REPAIR Right 1969   inguinal   LEFT HEART CATH AND CORONARY ANGIOGRAPHY Left 06/08/2019   Procedure: LEFT HEART CATH AND CORONARY ANGIOGRAPHY;  Surgeon: Antonette Batters, MD;  Location: [facility] INV[facility] CV [facility];  Service: Cardiovascular;  Laterality: Left;   SEPTOPLASTY Bilateral 06/11/2018   Procedure: SEPTOPLASTY;  Surgeon: [name], MD;  Location: [facility] OR[facility];  Service:  ENT;  Laterality: Bilateral;   SHOULDER SURGERY Right 2004   skull surgery     TOE SURGERY Right 2009   TONSILLECTOMY  2008     Social History:   reports that he has never smoked. He has never used smokeless tobacco. He reports that he does not drink alcohol and does not use drugs.   Family History:  His family history includes Asperger's syndrome in his mother.   Allergies Allergies  Allergen Reactions   Promethazine Other (See Comments)    Severe neuroleptic malignant syndrome requiring intubation   Mirtazapine Other (See Comments)    Other reaction(s): Other (See Comments) Irritability/anger, increased appetite, worsening depression Irritability/anger, increased appetite, worsening depression      Home Medications  Prior to Admission medications   Medication Sig Start Date End Date Taking? Authorizing Provider  acetaminophen  (TYLENOL ) 500 MG tablet Take 1,000 mg by mouth every 6 (six) hours as needed for mild pain or moderate pain.   Yes [provider]  aspirin  EC 81 MG tablet Take 81 mg by mouth daily.   Yes [provider]  B Complex-C (SUPER B COMPLEX PO) Take 1 tablet by mouth daily.   Yes [provider]  Calcium  Carbonate-Vitamin D  600-200 MG-UNIT CAPS Take 1 tablet by mouth 2 (two) times daily.    Yes [provider]  carvedilol  (COREG ) 3.125 MG tablet Take 3.125 mg by mouth daily.  11/03/18  Yes [provider]  cefdinir (OMNICEF) 300 MG capsule Take 300 mg by mouth 2 (two) times daily. 09/14/23 09/21/23 Yes [provider]  clindamycin  (CLEOCIN  T) 1 % lotion Apply 1 application topically 2 (two) times daily as needed.    Yes [provider]  clopidogrel  (PLAVIX ) 75 MG tablet Take 75 mg by mouth daily. 09/27/18  Yes [provider]  divalproex  (DEPAKOTE  ER) 250 MG 24 hr tablet Total of 750 mg daily. Take along with 500 mg tab 08/24/23  Yes Hisada, Ivan Marion, MD  divalproex  (DEPAKOTE  ER) 500 MG 24 hr tablet  Take 1 tablet (500 mg total) by mouth daily. 08/13/23 10/12/23 Yes Todd Fossa, MD  DULoxetine  (CYMBALTA ) 60 MG capsule Take 60 mg by mouth 2 (two) times daily.  11/17/18  Yes [provider]  FIBER PO Take 1 tablet by mouth 2 (two) times daily.    Yes [provider]  furosemide  (LASIX ) 20 MG tablet Take 20 mg by mouth daily. 11/03/18  Yes [provider]  latanoprost (  XALATAN) 0.005 % ophthalmic solution Place 1 drop into the left eye daily. 11/06/21  Yes [provider]  losartan (COZAAR) 100 MG tablet Take 100 mg by mouth daily.   Yes [provider]  Melatonin 5 MG TABS Take 10 mg by mouth at bedtime. 30 min before bed   Yes [provider]  omeprazole (PRILOSEC) 20 MG capsule Take 20 mg by mouth daily.   Yes [provider]  Probiotic Product (PROBIOTIC DAILY PO) Take 1 tablet by mouth daily. Senior   Yes [provider]  silodosin  (RAPAFLO ) 8 MG CAPS capsule TAKE 1 CAPSULE BY MOUTH DAILY WITH BREAKFAST Patient taking differently: Take 8 mg by mouth daily. 03/16/23  Yes Stoioff, Kizzie Perks, MD  sodium chloride  (OCEAN) 0.65 % SOLN nasal spray Place 2 sprays into both nostrils as needed for congestion.   Yes [provider]  tadalafil  (CIALIS ) 5 MG tablet Take 1 tablet by mouth once daily 08/03/23  Yes Stoioff, Scott C, MD  SYRINGE-NEEDLE, DISP, 3 ML (B-D 3CC LUER-LOK SYR 22GX1-1/2) 22G X 1-1/2" 3 ML MISC USE AS DIRECTED WITH TESTOSTERONE  08/24/23   Stoioff, Kizzie Perks, MD  testosterone  cypionate (DEPOTESTOSTERONE CYPIONATE) 200 MG/ML injection Inject 0.3 mLs (60 mg total) into the muscle once a week. DISCARD REMAINING AS THESE ARE SINGLE USE VIALS. Patient taking differently: Inject 60 mg into the muscle every 14 (fourteen) days. EVERY OTHER SATURDAY. DISCARD REMAINING AS THESE ARE SINGLE USE VIALS. 06/16/23   Geraline Knapp, MD     Critical care time: 58 minutes    Vergia Glasgow, MD [facility] 09/16/2023 9:37 AM

## 2023-09-16 NOTE — Progress Notes (Signed)
 ANTICOAGULATION CONSULT NOTE  Pharmacy Consult for Heparin  Infusion Indication: ACS/STEMI  Patient Measurements: Height: 5\' 6"  (167.6 cm) Weight: 91.1 kg (200 lb 13.4 oz) IBW/kg (Calculated) : 63.8 HEPARIN  DW (KG): 82.2 Heparin  Dosing Weight: 84.2 kg  Labs: Recent Labs    09/14/23 1835 09/15/23 0128 09/15/23 0244 09/15/23 0308 09/15/23 0612 09/15/23 0812 09/15/23 1007 09/15/23 1226 09/15/23 1646 09/15/23 2058 09/16/23 0359 09/16/23 1405 09/16/23 2124  HGB 15.6  --  13.8  --   --   --   --   --   --   --  12.8*  --   --   HCT 45.0  --  40.3  --   --   --   --   --   --   --  39.2  --   --   PLT 222  --  178  --  172  --   --   --   --   --  163  --   --   APTT  --   --  29  --   --   --   --   --   --   --   --   --   --   LABPROT  --   --  15.6*  --   --   --   --   --   --   --   --   --   --   INR  --   --  1.2  --   --   --   --   --   --   --   --   --   --   HEPARINUNFRC  --   --   --   --   --   --   --    < >  --    < > 0.22* 0.37 0.22*  CREATININE 1.03  --   --  1.15 1.05  --   --   --  1.08  --  0.88  --   --   CKTOTAL 136  --   --  612*  --   --   --   --   --   --   --   --   --   TROPONINIHS 106* 7,453*  --   --   --  15,306* 15,207*  --   --   --   --   --   --    < > = values in this interval not displayed.    Estimated Creatinine Clearance: 76.6 mL/min (by C-G formula based on SCr of 0.88 mg/dL).   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   Coronary artery disease    Dementia (HCC)    Depression    Disorder of male genital organs 07/11/2013   Dyspnea    Encounter for  long-term (current) use of medications 11/21/2014   Erectile dysfunction 07/11/2013   Executive function deficit 03/06/2016   GERD (gastroesophageal reflux disease)    Headache    stress   Hearing loss  Hyperlipidemia    Hypertension    Hypogonadism, 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   Major depression, recurrent, chronic (HCC) 03/06/2016   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   Mixed hyperlipidemia 02/02/2014   Myocardial infarction (HCC) 08/2013   OCD (obsessive compulsive disorder)    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   Other specified disorder of male genital organs(608.89) 07/11/2013   Prostatic hypertrophy    Pseudophakia of both eyes 04/20/2015   Raynaud's disease    Reduced libido 07/11/2013   Repeated falls    weak left ankle   Scoliosis    Stroke (HCC) 2/16   "light"   Synovitis    knees, ankles   Assessment: Pt is a 73 yo male presenting to ED c/o dyspnea and CP, being treated for possible meningitis, now found with elevated Troponin I level, trending up.  Goal of Therapy:  Heparin  level 0.3-0.7 units/ml Monitor platelets by anticoagulation protocol: Yes  Date Time HL Rate/Comment 5/28 2124 0.22 Subtherapeutic   Plan:  --Heparin  level is subtherapeutic --Bolus 1250 units then increase infusion to 1500 units/hr --Check HL in 8 hours --Daily CBC per protocol while on IV heparin   Will M. Alva Jewels, PharmD Clinical Pharmacist 09/16/2023 9:56 PM

## 2023-09-16 NOTE — Consult Note (Signed)
 PHARMACY CONSULT NOTE - ELECTROLYTES  Pharmacy Consult for Electrolyte Monitoring and Replacement   Recent Labs: Potassium (mmol/L)  Date Value  09/16/2023 4.4  05/23/2014 4.1   Magnesium  (mg/dL)  Date Value  52/84/1324 2.3  09/13/2013 1.9   Calcium  (mg/dL)  Date Value  40/01/2724 6.9 (L)   Calcium , Total (mg/dL)  Date Value  36/64/4034 8.5   Albumin (g/dL)  Date Value  74/25/9563 2.8 (L)  05/23/2014 3.7   Phosphorus (mg/dL)  Date Value  87/56/4332 3.0   Sodium (mmol/L)  Date Value  09/16/2023 140  05/23/2014 143   Height: 5\' 6"  (167.6 cm) Weight: 91.1 kg (200 lb 13.4 oz) IBW/kg (Calculated) : 63.8 Estimated Creatinine Clearance: 76.6 mL/min (by C-G formula based on SCr of 0.88 mg/dL).  Assessment  Blake [name] is a 73 y.o. male presenting with acute respiratory failure and shock. PMH significant for bilateral fronto temporal encephalomalacia due to severe TBI resulting longstanding cognitive deficits, poor social inhibition, apathy, severe TBI (thrown into a field by a horse and fell off, then he got back onto the horse and fell again on asphalt), HFrEF, ischemic cardiomyopathy, hyperlipidemia, SVT, CAD, depression, GERD, hearing loss of both ears, MI, CVA, HTN, hypotestosteronemia, ICD in place [facility] Scientific D433 DEFIBRILLATOR, RESONATE EL DR DF4 S/N: [id number] implanted 11/21/2021), and OSA on CPAP . Pharmacy has been consulted to monitor and replace electrolytes.  Diet: NPO, intubated MIVF: N/A Pertinent medications: N/A  Goal of Therapy: Electrolytes within normal limits  Plan:  No electrolyte replacement indicated Labs tomorrow  Thank you for allowing pharmacy to be a part of this patient's care.  Page Boast 09/16/2023 8:06 AM

## 2023-09-16 NOTE — Procedures (Signed)
 Extubation Procedure Note  Patient Details:   Name: Blake [name] DOB: 08/03/1950 MRN: [id number]   Airway Documentation:    Vent end date: 09/16/23 Vent end time: 1355   Evaluation  O2 sats: stable throughout Complications: No apparent complications Patient did tolerate procedure well. Bilateral Breath Sounds: Diminished   Yes  Patient extubated to HFNC 8 lpm with no complications  Irean Manner 09/16/2023, 2:10 PM
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
## 2023-09-16 NOTE — Progress Notes (Signed)
 [facility] CLINIC CARDIOLOGY PROGRESS NOTE       Patient ID: Blake [name] MRN: [id number] DOB/AGE: 75-Aug-1952 75 y.o.  Admit date: 09/14/2023 Referring Physician Dr. Vergia Glasgow Primary Physician Rory Collard, MD Primary Cardiologist Dr. Beau Bound Reason for Consultation Elevated trops, NSTEMI  HPI: Blake [name] is a 73 y.o. male  with a past medical history of coronary artery disease s/p stent to LAD, ischemic cardiomyopathy s/p ICD ([employer]), chronic HFrEF, OSA (on CPAP), hx CVA who presented to the ED on 09/14/2023 for dyspnea, chest pain and AMS. Patient recently seen at urgent care for nonproductive cough, chills/diaphoresis Troponins found to be elevated.  Cardiology was consulted for further evaluation.   Interval History: -Patient seen and examined this AM. Patient intubated and remains on levo gtt with variable BP and HR. -Overnight Tele showed no significant events.  -Total UOP 1.54L yesterday with stable renal function. -Electrolytes are stable.    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   Coronary artery disease    Dementia (HCC)    Depression    Disorder of male genital organs 07/11/2013   Dyspnea    Encounter for long-term (current) use of medications 11/21/2014   Erectile dysfunction 07/11/2013   Executive function deficit 03/06/2016   GERD (gastroesophageal reflux disease)    Headache    stress   Hearing loss    Hyperlipidemia    Hypertension    Hypogonadism, 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   Hypotestosteronism  07/11/2013   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   Major depression, recurrent, chronic (HCC) 03/06/2016   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   Mixed hyperlipidemia 02/02/2014   Myocardial infarction (HCC) 08/2013   OCD (obsessive compulsive disorder)    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   Other specified disorder of male genital organs(608.89) 07/11/2013   Prostatic hypertrophy    Pseudophakia of both eyes 04/20/2015   Raynaud's disease    Reduced libido 07/11/2013   Repeated falls    weak left ankle   Scoliosis    Stroke (HCC) 2/16   "light"   Synovitis    knees, ankles    Past Surgical History:  Procedure Laterality Date   BACK SURGERY  2011   rods and screws   CLOSED REDUCTION NASAL FRACTURE Bilateral 06/11/2018   Procedure: CLOSED REDUCTION NASAL FRACTURE;  Surgeon: [name], MD;  Location: [facility] ORS;  Service: ENT;  Laterality: Bilateral;   COLONOSCOPY  2015   CORONARY ANGIOPLASTY     2015 stent   CORONARY STENT INTERVENTION N/A 06/08/2019   Procedure: CORONARY STENT INTERVENTION;  Surgeon: Antonette Batters, MD;  Location: [facility] INVASIVE CV [facility];  Service: Cardiovascular;  Laterality: N/A;   CYSTOSCOPY  1984   EYE SURGERY Bilateral 2005,2006,2009   detached retina, cataract   FOOT ARTHRODESIS Left 05/30/2015   Procedure: ARTHRODESIS FOOT STJ LT FOOT;  Surgeon: Anell Baptist, DPM;  [facility]: [facility];  Service: Podiatry;  Laterality: Left;  WITH  POPLITEAL BLOCK   FOOT SURGERY Left    HERNIA REPAIR Right 1969   inguinal   LEFT HEART CATH AND CORONARY ANGIOGRAPHY Left 06/08/2019   Procedure: LEFT HEART CATH AND CORONARY ANGIOGRAPHY;  Surgeon: Antonette Batters, MD;  Location: [facility] INVASIVE CV LAB;  Service: Cardiovascular;  Laterality: Left;   SEPTOPLASTY Bilateral 06/11/2018   Procedure: SEPTOPLASTY;  Surgeon: [name], MD;  Location: [facility] OR[facility];  Service: ENT;  Laterality: Bilateral;   SHOULDER SURGERY Right  2004   skull surgery     TOE SURGERY Right 2009   TONSILLECTOMY  2008    Medications Prior to Admission  Medication Sig Dispense Refill Last Dose/Taking   acetaminophen  (TYLENOL ) 500 MG tablet Take 1,000 mg by mouth every 6 (six) hours as needed for mild pain or moderate pain.   Unknown   aspirin  EC 81 MG tablet Take 81 mg by mouth daily.   09/14/2023 at  6:00 AM   B Complex-C (SUPER B COMPLEX PO) Take 1 tablet by mouth daily.   09/14/2023 Morning   Calcium  Carbonate-Vitamin D  600-200 MG-UNIT CAPS Take 1 tablet by mouth 2 (two) times daily.    09/14/2023 Morning   carvedilol  (COREG ) 3.125 MG tablet Take 3.125 mg by mouth daily.    09/14/2023 Morning   cefdinir (OMNICEF) 300 MG capsule Take 300 mg by mouth 2 (two) times daily.   09/14/2023 Noon   clindamycin  (CLEOCIN  T) 1 % lotion Apply 1 application topically 2 (two) times daily as needed.    Unknown   clopidogrel  (PLAVIX ) 75 MG tablet Take 75 mg by mouth daily.   09/14/2023 at  6:00 AM   divalproex  (DEPAKOTE  ER) 250 MG 24 hr tablet Total of 750 mg daily. Take along with 500 mg tab 30 tablet 0 09/14/2023 at  8:00 AM   divalproex  (DEPAKOTE  ER) 500 MG 24 hr tablet Take 1 tablet (500 mg total) by mouth daily. 30 tablet 1 09/14/2023 at  8:00 AM   DULoxetine  (CYMBALTA ) 60 MG capsule Take 60 mg by mouth 2 (two) times daily.    09/14/2023 at  3:30 AM   FIBER PO Take 1 tablet by mouth 2 (two) times daily.    09/14/2023 Morning   furosemide  (LASIX ) 20 MG tablet Take 20 mg by mouth daily.   09/14/2023 Morning   latanoprost (XALATAN) 0.005 % ophthalmic solution Place 1 drop into the left eye daily.   09/14/2023 Morning   losartan (COZAAR) 100 MG tablet Take 100 mg by mouth daily.   09/14/2023 Morning   Melatonin 5 MG TABS Take 10 mg by mouth at bedtime. 30 min before bed   09/13/2023 Bedtime   omeprazole (PRILOSEC) 20 MG capsule Take 20 mg by mouth daily.   09/14/2023 Morning   Probiotic Product (PROBIOTIC DAILY PO) Take 1 tablet by mouth daily. Senior   09/14/2023  Morning   silodosin  (RAPAFLO ) 8 MG CAPS capsule TAKE 1 CAPSULE BY MOUTH DAILY WITH BREAKFAST (Patient taking differently: Take 8 mg by mouth daily.) 90 capsule 3 09/13/2023 at  6:00 PM   sodium chloride  (OCEAN) 0.65 % SOLN nasal spray Place 2 sprays into both nostrils as needed for congestion.   09/14/2023 Morning   tadalafil  (CIALIS ) 5 MG tablet Take 1 tablet by mouth once daily 90 tablet 0 09/14/2023 Morning   SYRINGE-NEEDLE, DISP, 3 ML (B-D 3CC LUER-LOK SYR 22GX1-1/2) 22G X 1-1/2" 3 ML MISC USE AS DIRECTED WITH TESTOSTERONE  12 each 20    testosterone  cypionate (DEPOTESTOSTERONE CYPIONATE)  200 MG/ML injection Inject 0.3 mLs (60 mg total) into the muscle once a week. DISCARD REMAINING AS THESE ARE SINGLE USE VIALS. (Patient taking differently: Inject 60 mg into the muscle every 14 (fourteen) days. EVERY OTHER SATURDAY. DISCARD REMAINING AS THESE ARE SINGLE USE VIALS.) 5 mL 0    Social History   Socioeconomic History   Marital status: Married    Spouse name: cindy   Number of children: 1   Years of education: Not on file   Highest education level: Professional school degree (e.g., MD, DDS, DVM, JD)  Occupational History   Not on file  Tobacco Use   Smoking status: Never   Smokeless tobacco: Never  Vaping Use   Vaping status: Never Used  Substance and Sexual Activity   Alcohol use: No   Drug use: No   Sexual activity: Not Currently    Birth control/protection: None  Other Topics Concern   Not on file  Social History Narrative   Not on file   Social Drivers of Health   Financial Resource Strain: Not on file  Food Insecurity: No Food Insecurity (09/15/2023)   Hunger Vital Sign    Worried About Running Out of Food in the Last Year: Never true    Ran Out of Food in the Last Year: Never true  Transportation Needs: No Transportation Needs (09/15/2023)   PRAPARE - Administrator, Civil Service (Medical): No    Lack of Transportation (Non-Medical): No  Physical Activity: Not  on file  Stress: Not on file  Social Connections: Patient Unable To Answer (09/15/2023)   Social Connection and Isolation Panel [NHANES]    Frequency of Communication with Friends and Family: Patient unable to answer    Frequency of Social Gatherings with Friends and Family: Patient unable to answer    Attends Religious Services: Patient unable to answer    Active Member of Clubs or Organizations: Patient unable to answer    Attends Banker Meetings: Patient unable to answer    Marital Status: Patient unable to answer  Intimate Partner Violence: Unknown (09/15/2023)   Humiliation, Afraid, Rape, and Kick questionnaire    Fear of Current or Ex-Partner: Patient unable to answer    Emotionally Abused: Patient unable to answer    Physically Abused: Not on file    Sexually Abused: Patient unable to answer    Family History  Problem Relation Age of Onset   Asperger's syndrome Mother      Vitals:   09/16/23 0745 09/16/23 0753 09/16/23 0800 09/16/23 0808  BP:   117/73   Pulse: (!) 37 77 79   Resp: 20 20 20    Temp: 99 F (37.2 C) 99 F (37.2 C) 99 F (37.2 C)   TempSrc:      SpO2: 100% 100% 100% 100%  Weight:      Height:        PHYSICAL EXAM General: Chronically ill-appearing elderly male, well nourished, in no acute distress. HEENT: Normocephalic and atraumatic. Neck: No JVD.   Lungs: Intubated.  Mechanical breath sounds bilaterally. Heart: HRRR. Normal S1 and S2 without gallops or murmurs.  Abdomen: Non-distended appearing.  Msk: Normal strength and tone for age. Extremities: Warm and well perfused. No clubbing, cyanosis. No edema.  Neuro: Alert and oriented X 3. Psych: Answers questions appropriately.   Labs: Basic Metabolic Panel: Recent Labs    09/15/23 0612 09/15/23 1646 09/16/23 0359  NA 136 138 140  K 2.8* 5.0 4.4  CL 107 109 111  CO2 20* 22 20*  GLUCOSE 145* 147* 136*  BUN 17 17 18   CREATININE 1.05 1.08 0.88  CALCIUM  7.7* 7.7* 6.9*  MG 2.2   --  2.3  PHOS 2.4*  --  3.0   Liver Function Tests: Recent Labs    09/15/23 0308 09/16/23 0359  AST 76* 59*  ALT 26 26  ALKPHOS 40 34*  BILITOT 0.8 0.5  PROT 5.8* 5.5*  ALBUMIN 3.1* 2.8*   No results for input(s): "LIPASE", "AMYLASE" in the last 72 hours. CBC: Recent Labs    09/14/23 1835 09/15/23 0244 09/15/23 0612 09/16/23 0359  WBC 9.3 6.6  --  13.3*  NEUTROABS 7.5  --   --   --   HGB 15.6 13.8  --  12.8*  HCT 45.0 40.3  --  39.2  MCV 91.8 92.9  --  96.1  PLT 222 178 172 163   Cardiac Enzymes: Recent Labs    09/14/23 1835 09/15/23 0128 09/15/23 0308 09/15/23 0812 09/15/23 1007  CKTOTAL 136  --  612*  --   --   TROPONINIHS 106* 7,453*  --  15,306* 15,207*   BNP: Recent Labs    09/14/23 1835  BNP 573.5*   D-Dimer: No results for input(s): "DDIMER" in the last 72 hours. Hemoglobin A1C: No results for input(s): "HGBA1C" in the last 72 hours. Fasting Lipid Panel: No results for input(s): "CHOL", "HDL", "LDLCALC", "TRIG", "CHOLHDL", "LDLDIRECT" in the last 72 hours. Thyroid Function Tests: Recent Labs    09/15/23 0308  TSH 1.658   Anemia Panel: No results for input(s): "VITAMINB12", "FOLATE", "FERRITIN", "TIBC", "IRON", "RETICCTPCT" in the last 72 hours.   Radiology: [facility] Chest Port 1 View Result Date: 09/15/2023 EXAM: 1 VIEW XRAY OF THE CHEST 09/15/2023 10:35:00 AM COMPARISON: 1 view chest x-ray 05/16/2023 at 2:38 PM. CLINICAL HISTORY: Endotracheally intubated. FINDINGS: LUNGS AND PLEURA: Mild edema and bilateral effusions are similar to the prior study. Bibasilar airspace opacity likely reflects atelectasis. HEART AND MEDIASTINUM: The heart is enlarged. BONES AND SOFT TISSUES: No acute osseous abnormality. LINES AND TUBES: The endotracheal tube is stable, 3.5 cm above the carina. A right IJ line is stable. IMPRESSION: 1. Stable endotracheal tube and right IJ line. 2. Enlarged heart. 3. Mild edema and bilateral effusions, similar to the prior study. 4.  Bibasilar airspace opacity, likely atelectasis. Electronically signed by: Audree Leas MD 09/15/2023 01:26 PM EDT RP Workstation: [id number]   DG Chest Port 1 View Result Date: 09/15/2023 CLINICAL DATA:  Central line placement EXAM: PORTABLE CHEST 1 VIEW COMPARISON:  Radiograph and CT 09/14/2023 FINDINGS: Stable cardiomegaly. Pulmonary vascular congestion and bilateral ground-glass opacities. Small right pleural effusion and right basilar airspace opacities. No pneumothorax. Endotracheal tube tip in the intrathoracic trachea 3.0 cm from the carina. Subdiaphragmatic enteric tube. Right IJ CVC tip in the mid SVC. Left chest wall ICD. IMPRESSION: 1. Right IJ CVC tip in the mid SVC. No pneumothorax. 2. Similar findings of congestive heart failure. Electronically Signed   By: Rozell Cornet M.D.   On: 09/15/2023 02:52   CT Angio Chest PE W and/or Wo Contrast Result Date: 09/14/2023 CLINICAL DATA:  Cold symptoms, chest pain, short of breath, abdominal pain EXAM: CT ANGIOGRAPHY CHEST CT ABDOMEN AND PELVIS WITH CONTRAST TECHNIQUE: Multidetector CT imaging of the chest was performed using the standard protocol during bolus administration of intravenous contrast. Multiplanar CT image reconstructions and MIPs were obtained to evaluate the vascular anatomy. Multidetector CT imaging of the  abdomen and pelvis was performed using the standard protocol during bolus administration of intravenous contrast. RADIATION DOSE REDUCTION: This exam was performed according to the departmental dose-optimization program which includes automated exposure control, adjustment of the mA and/or kV according to patient size and/or use of iterative reconstruction technique. CONTRAST:  OMNIPAQUE  IOHEXOL  350 MG/ML SOLN COMPARISON:  01/02/2010, 08/23/2013, 09/14/2023 FINDINGS: CTA CHEST FINDINGS Cardiovascular: This is a technically adequate evaluation of the pulmonary vasculature. No filling defects or pulmonary emboli. Mild  cardiomegaly with left ventricular dilatation. No pericardial effusion. Dual lead cardiac pacer, proximal lead in the right atrium and distal lead in the right ventricle. 4.1 cm ascending thoracic aortic aneurysm. No evidence of dissection. Atherosclerosis of the aorta and coronary vasculature. Mediastinum/Nodes: Endotracheal tube terminates just above carina. Enteric catheter extends into the gastric lumen. No pathologic adenopathy. Lungs/Pleura: There is dense bilateral perihilar airspace disease, greatest in the lower lobes. Trace bilateral pleural effusions. No pneumothorax. Central airways are patent. Musculoskeletal: No acute or destructive bony abnormalities. Reconstructed images demonstrate no additional findings. Review of the MIP images confirms the above findings. CT ABDOMEN and PELVIS FINDINGS Hepatobiliary: No focal liver abnormality is seen. No gallstones, gallbladder wall thickening, or biliary dilatation. Pancreas: Unremarkable. No pancreatic ductal dilatation or surrounding inflammatory changes. Spleen: Normal in size without focal abnormality. Adrenals/Urinary Tract: Adrenal glands are unremarkable. Kidneys are normal, without renal calculi, focal lesion, or hydronephrosis. The bladder is decompressed with an indwelling Foley catheter. Stomach/Bowel: No bowel obstruction or ileus. Normal appendix right lower quadrant. Scattered colonic diverticulosis without evidence of acute diverticulitis. Enteric catheter tip within the lumen of the gastric body. Vascular/Lymphatic: Aortic atherosclerosis. No enlarged abdominal or pelvic lymph nodes. Reproductive: Stable enlargement of the prostate. Other: No free fluid or free intraperitoneal gas. No abdominal wall hernia. Musculoskeletal: No acute or destructive bony abnormalities. Postsurgical changes from L2-L4 posterior fusion. Reconstructed images demonstrate no additional findings. Review of the MIP images confirms the above findings. IMPRESSION: Chest:  1. No evidence of pulmonary embolus. 2. Dense bilateral perihilar airspace disease, greatest in the lower lobes, which could reflect widespread infection or edema. 3. Trace bilateral pleural effusions. 4. Aortic Atherosclerosis (ICD10-I70.0). Coronary artery atherosclerosis. 5. 4.1 cm ascending thoracic aortic aneurysm. Recommend annual imaging followup by CTA or MRA. This recommendation follows 2010 ACCF/AHA/AATS/ACR/ASA/SCA/SCAI/SIR/STS/SVM Guidelines for the Diagnosis and Management of Patients with Thoracic Aortic Disease. Circulation. 2010; 121: Z610-R604. Aortic aneurysm NOS (ICD10-I71.9) Abdomen/pelvis: 1. No acute intra-abdominal or intrapelvic process. Normal appendix. 2. Distal colonic diverticulosis without diverticulitis. 3. Enlarged prostate. 4.  Aortic Atherosclerosis (ICD10-I70.0). Electronically Signed   By: Bobbye Burrow M.D.   On: 09/14/2023 22:40   CT ABDOMEN PELVIS W CONTRAST Result Date: 09/14/2023 CLINICAL DATA:  Cold symptoms, chest pain, short of breath, abdominal pain EXAM: CT ANGIOGRAPHY CHEST CT ABDOMEN AND PELVIS WITH CONTRAST TECHNIQUE: Multidetector CT imaging of the chest was performed using the standard protocol during bolus administration of intravenous contrast. Multiplanar CT image reconstructions and MIPs were obtained to evaluate the vascular anatomy. Multidetector CT imaging of the abdomen and pelvis was performed using the standard protocol during bolus administration of intravenous contrast. RADIATION DOSE REDUCTION: This exam was performed according to the departmental dose-optimization program which includes automated exposure control, adjustment of the mA and/or kV according to patient size and/or use of iterative reconstruction technique. CONTRAST:  OMNIPAQUE  IOHEXOL  350 MG/ML SOLN COMPARISON:  01/02/2010, 08/23/2013, 09/14/2023 FINDINGS: CTA CHEST FINDINGS Cardiovascular: This is a technically adequate evaluation of the pulmonary vasculature. No  filling defects  or pulmonary emboli. Mild cardiomegaly with left ventricular dilatation. No pericardial effusion. Dual lead cardiac pacer, proximal lead in the right atrium and distal lead in the right ventricle. 4.1 cm ascending thoracic aortic aneurysm. No evidence of dissection. Atherosclerosis of the aorta and coronary vasculature. Mediastinum/Nodes: Endotracheal tube terminates just above carina. Enteric catheter extends into the gastric lumen. No pathologic adenopathy. Lungs/Pleura: There is dense bilateral perihilar airspace disease, greatest in the lower lobes. Trace bilateral pleural effusions. No pneumothorax. Central airways are patent. Musculoskeletal: No acute or destructive bony abnormalities. Reconstructed images demonstrate no additional findings. Review of the MIP images confirms the above findings. CT ABDOMEN and PELVIS FINDINGS Hepatobiliary: No focal liver abnormality is seen. No gallstones, gallbladder wall thickening, or biliary dilatation. Pancreas: Unremarkable. No pancreatic ductal dilatation or surrounding inflammatory changes. Spleen: Normal in size without focal abnormality. Adrenals/Urinary Tract: Adrenal glands are unremarkable. Kidneys are normal, without renal calculi, focal lesion, or hydronephrosis. The bladder is decompressed with an indwelling Foley catheter. Stomach/Bowel: No bowel obstruction or ileus. Normal appendix right lower quadrant. Scattered colonic diverticulosis without evidence of acute diverticulitis. Enteric catheter tip within the lumen of the gastric body. Vascular/Lymphatic: Aortic atherosclerosis. No enlarged abdominal or pelvic lymph nodes. Reproductive: Stable enlargement of the prostate. Other: No free fluid or free intraperitoneal gas. No abdominal wall hernia. Musculoskeletal: No acute or destructive bony abnormalities. Postsurgical changes from L2-L4 posterior fusion. Reconstructed images demonstrate no additional findings. Review of the MIP images confirms the above  findings. IMPRESSION: Chest: 1. No evidence of pulmonary embolus. 2. Dense bilateral perihilar airspace disease, greatest in the lower lobes, which could reflect widespread infection or edema. 3. Trace bilateral pleural effusions. 4. Aortic Atherosclerosis (ICD10-I70.0). Coronary artery atherosclerosis. 5. 4.1 cm ascending thoracic aortic aneurysm. Recommend annual imaging followup by CTA or MRA. This recommendation follows 2010 ACCF/AHA/AATS/ACR/ASA/SCA/SCAI/SIR/STS/SVM Guidelines for the Diagnosis and Management of Patients with Thoracic Aortic Disease. Circulation. 2010; 121: Z610-R604. Aortic aneurysm NOS (ICD10-I71.9) Abdomen/pelvis: 1. No acute intra-abdominal or intrapelvic process. Normal appendix. 2. Distal colonic diverticulosis without diverticulitis. 3. Enlarged prostate. 4.  Aortic Atherosclerosis (ICD10-I70.0). Electronically Signed   By: Bobbye Burrow M.D.   On: 09/14/2023 22:40   CT HEAD WO CONTRAST ( ) Result Date: 09/14/2023 CLINICAL DATA:  Head trauma, minor (Age >= 65y) EXAM: CT HEAD WITHOUT CONTRAST TECHNIQUE: Contiguous axial images were obtained from the base of the skull through the vertex without intravenous contrast. RADIATION DOSE REDUCTION: This exam was performed according to the departmental dose-optimization program which includes automated exposure control, adjustment of the mA and/or kV according to patient size and/or use of iterative reconstruction technique. COMPARISON:  CT head 08/15/2022 FINDINGS: Brain: Bilateral anterior inferior frontal lobe and bilateral anterior temporal lobe encephalomalacia. Left cerebellar encephalomalacia. No evidence of large-territorial acute infarction. No parenchymal hemorrhage. No mass lesion. No extra-axial collection. No mass effect or midline shift. No hydrocephalus. Basilar cisterns are patent. Vascular: No hyperdense vessel. Atherosclerotic calcifications are present within the cavernous internal carotid and vertebral arteries. Skull: No  acute fracture or focal lesion. Old left occipital skull fracture. Sinuses/Orbits: Paranasal sinuses and mastoid air cells are clear. Bilateral lens replacement. Bilateral scleral buckle. Otherwise the orbits are unremarkable. Other: Partially visualized endotracheal tube. IMPRESSION: No acute intracranial abnormality. Electronically Signed   By: Morgane  Naveau M.D.   On: 09/14/2023 22:36   DG Abd Portable 1 View Result Date: 09/14/2023 CLINICAL DATA:  Enteric catheter placement EXAM: PORTABLE ABDOMEN - 1 VIEW COMPARISON:  None Available. FINDINGS: Frontal  view of the lower chest and upper abdomen demonstrates enteric catheter passing below diaphragm tip projecting over gastric body. Interstitial and ground-glass opacities throughout the lungs consistent with edema. Unremarkable bowel gas pattern. IMPRESSION: 1. Enteric catheter tip projects over gastric body. Electronically Signed   By: Bobbye Burrow M.D.   On: 09/14/2023 20:55   DG Chest Port 1 View Result Date: 09/14/2023 CLINICAL DATA:  Intubated, CHF, tachypnea EXAM: PORTABLE CHEST 1 VIEW COMPARISON:  09/14/2023 FINDINGS: Single frontal view of the chest demonstrates endotracheal tube overlying tracheal air column, tip of proximally 2 cm above carina. Dual lead pacemaker/AICD unchanged. Cardiac silhouette remains mildly enlarged. Stable interstitial and ground-glass opacities throughout the lungs. No large effusion or pneumothorax. No acute bony abnormalities. IMPRESSION: 1. No complication after intubation. 2. Stable findings of congestive heart failure. Electronically Signed   By: Bobbye Burrow M.D.   On: 09/14/2023 20:55   DG Chest Port 1 View Result Date: 09/14/2023 CLINICAL DATA:  Tachypnea.  CHF EXAM: PORTABLE CHEST 1 VIEW COMPARISON:  X-ray 01/14/2022 and older FINDINGS: Left upper chest battery pack for defibrillator with leads along the right side of the heart. Stable cardiopericardial silhouette. Tortuous aorta. Increasing vascular  congestion and component of possible edema. No pneumothorax or effusion. No consolidation. Degenerative changes along the spine. Overlapping cardiac leads. IMPRESSION: Increasing vascular congestion and interstitial changes, possible edema. Defibrillator. Electronically Signed   By: Adrianna Horde M.D.   On: 09/14/2023 18:20    ECHO pending  TELEMETRY reviewed by me 09/16/2023: Sinus rhythm, rate 60s  EKG reviewed by me: Sinus rhythm rate 93 bpm  Data reviewed by me 09/16/2023: last 24h vitals tele labs imaging I/O critical care team notes.  Principal Problem:   Acute respiratory failure with hypoxia (HCC) Active Problems:   Sepsis (HCC)   Altered mental status   HFrEF (heart failure with reduced ejection fraction) (HCC)   Non-ST elevation MI (NSTEMI) (HCC)   Aspiration pneumonia of both lower lobes (HCC)    ASSESSMENT AND PLAN:  Blake [name] is a 73 y.o. male  with a past medical history of coronary artery disease s/p stent to LAD, ischemic cardiomyopathy s/p ICD ([employer]), chronic HFrEF, OSA (on CPAP), hx CVA who presented to the ED on 09/14/2023 for dyspnea, chest pain and AMS. Patient recently seen at urgent care for nonproductive cough, chills/diaphoresis Troponins found to be elevated.  Cardiology was consulted for further evaluation  # NSTEMI # Acute hypoxic respiratory failure  # Acute metabolic encephalopathy  # Sepsis # Acute on chronic HFrEF # Ischemic cardiomyopathy s/p ICD Patient presented to ED with chest pain, dyspnea. Patient intubated for airway protection. BNP elevated at 570.  Troponins elevated and trending 100 > 7400 > 15300 > 15200.  EKG in ED with sinus tachycardia, rate 144 bpm.  Per telemetry patient remains in sinus rhythm.  Patient intubated and remains on Levophed . -Echo pening.. -Monitor and replenish electrolytes for a goal K >4, Mag >2. -Continue heparin  gtt for medical management for NSTEMI. -Continue aspirin  81 mg per tube  daily. -Consider resuming home GDMT once patient off pressors and hemodynamically stable. -Consider LHC when patient off pressors and hemodynamically stable. -Acute hypoxic respiratory failure, acute metabolic encephalopathy, sepsis management per primary.  TIMI Risk Score for Unstable Angina or Non-ST Elevation MI:   The patient's TIMI risk score is 5, which indicates a 26% risk of all cause mortality, new or recurrent myocardial infarction or need for urgent revascularization in the next  14 days.    This patient's plan of care was discussed and created with Dr. Custovic and she is in agreement.  Signed: Creighton Doffing, PA-C  09/16/2023, 9:16 AM [facility]

## 2023-09-16 NOTE — Plan of Care (Signed)
  Problem: Clinical Measurements: Goal: Ability to maintain clinical measurements within normal limits will improve Outcome: Progressing Goal: Diagnostic test results will improve Outcome: Progressing Goal: Respiratory complications will improve Outcome: Progressing Goal: Cardiovascular complication will be avoided Outcome: Progressing   Problem: Coping: Goal: Level of anxiety will decrease Outcome: Progressing   Problem: Pain Managment: Goal: General experience of comfort will improve and/or be controlled Outcome: Progressing   Problem: Safety: Goal: Ability to remain free from injury will improve Outcome: Progressing   Problem: Skin Integrity: Goal: Risk for impaired skin integrity will decrease Outcome: Progressing   Problem: Education: Goal: Knowledge of General Education information will improve Description: Including pain rating scale, medication(s)/side effects and non-pharmacologic comfort measures Outcome: Not Progressing   Problem: Nutrition: Goal: Adequate nutrition will be maintained Outcome: Not Progressing

## 2023-09-16 NOTE — Progress Notes (Signed)
 ANTICOAGULATION CONSULT NOTE  Pharmacy Consult for Heparin  Infusion Indication: ACS/STEMI  Patient Measurements: Height: 5\' 6"  (167.6 cm) Weight: 91.1 kg (200 lb 13.4 oz) IBW/kg (Calculated) : 63.8 HEPARIN  DW (KG): 82.2 Heparin  Dosing Weight: 84.2 kg  Labs: Recent Labs    09/14/23 1835 09/15/23 0128 09/15/23 0244 09/15/23 0308 09/15/23 0612 09/15/23 0812 09/15/23 1007 09/15/23 1226 09/15/23 1646 09/15/23 2058 09/16/23 0359 09/16/23 1405  HGB 15.6  --  13.8  --   --   --   --   --   --   --  12.8*  --   HCT 45.0  --  40.3  --   --   --   --   --   --   --  39.2  --   PLT 222  --  178  --  172  --   --   --   --   --  163  --   APTT  --   --  29  --   --   --   --   --   --   --   --   --   LABPROT  --   --  15.6*  --   --   --   --   --   --   --   --   --   INR  --   --  1.2  --   --   --   --   --   --   --   --   --   HEPARINUNFRC  --   --   --   --   --   --   --    < >  --  0.20* 0.22* 0.37  CREATININE 1.03  --   --  1.15 1.05  --   --   --  1.08  --  0.88  --   CKTOTAL 136  --   --  612*  --   --   --   --   --   --   --   --   TROPONINIHS 106* 7,453*  --   --   --  15,306* 15,207*  --   --   --   --   --    < > = values in this interval not displayed.    Estimated Creatinine Clearance: 76.6 mL/min (by C-G formula based on SCr of 0.88 mg/dL).   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   Coronary artery disease    Dementia (HCC)    Depression    Disorder of male genital organs 07/11/2013   Dyspnea    Encounter for long-term (current) use of medications 11/21/2014   Erectile  dysfunction 07/11/2013   Executive function deficit 03/06/2016   GERD (gastroesophageal reflux disease)    Headache    stress   Hearing loss    Hyperlipidemia    Hypertension    Hypogonadism, 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   Major  depression, recurrent, chronic (HCC) 03/06/2016   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   Mixed hyperlipidemia 02/02/2014   Myocardial infarction (HCC) 08/2013   OCD (obsessive compulsive disorder)    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   Other specified disorder of male genital organs(608.89) 07/11/2013   Prostatic hypertrophy    Pseudophakia of both eyes 04/20/2015   Raynaud's disease    Reduced libido 07/11/2013   Repeated falls    weak left ankle   Scoliosis    Stroke (HCC) 2/16   "light"   Synovitis    knees, ankles   Assessment: Pt is a 73 yo male presenting to ED c/o dyspnea and CP, being treated for possible meningitis, now found with elevated Troponin I level, trending up.  Goal of Therapy:  Heparin  level 0.3-0.7 units/ml Monitor platelets by anticoagulation protocol: Yes   Plan:  --Heparin  level is therapeutic x 1 --Continue heparin  infusion at 1350 units/hr --Recheck confirmatory HL in 8 hours --Daily CBC per protocol while on IV heparin   Page Boast 09/16/2023 3:04 PM

## 2023-09-17 ENCOUNTER — Inpatient Hospital Stay

## 2023-09-17 DIAGNOSIS — J9601 Acute respiratory failure with hypoxia: Secondary | ICD-10-CM | POA: Diagnosis not present

## 2023-09-17 DIAGNOSIS — R579 Shock, unspecified: Secondary | ICD-10-CM | POA: Diagnosis not present

## 2023-09-17 DIAGNOSIS — G928 Other toxic encephalopathy: Secondary | ICD-10-CM | POA: Diagnosis not present

## 2023-09-17 DIAGNOSIS — I2510 Atherosclerotic heart disease of native coronary artery without angina pectoris: Secondary | ICD-10-CM | POA: Diagnosis not present

## 2023-09-17 DIAGNOSIS — J690 Pneumonitis due to inhalation of food and vomit: Secondary | ICD-10-CM | POA: Diagnosis not present

## 2023-09-17 DIAGNOSIS — I5023 Acute on chronic systolic (congestive) heart failure: Secondary | ICD-10-CM | POA: Diagnosis not present

## 2023-09-17 LAB — CBC WITH DIFFERENTIAL/PLATELET
Abs Immature Granulocytes: 0.06 10*3/uL (ref 0.00–0.07)
Basophils Absolute: 0 10*3/uL (ref 0.0–0.1)
Basophils Relative: 0 %
Eosinophils Absolute: 0 10*3/uL (ref 0.0–0.5)
Eosinophils Relative: 0 %
HCT: 39.4 % (ref 39.0–52.0)
Hemoglobin: 13.1 g/dL (ref 13.0–17.0)
Immature Granulocytes: 1 %
Lymphocytes Relative: 10 %
Lymphs Abs: 1 10*3/uL (ref 0.7–4.0)
MCH: 32 pg (ref 26.0–34.0)
MCHC: 33.2 g/dL (ref 30.0–36.0)
MCV: 96.3 fL (ref 80.0–100.0)
Monocytes Absolute: 0.7 10*3/uL (ref 0.1–1.0)
Monocytes Relative: 7 %
Neutro Abs: 7.6 10*3/uL (ref 1.7–7.7)
Neutrophils Relative %: 82 %
Platelets: 170 10*3/uL (ref 150–400)
RBC: 4.09 MIL/uL — ABNORMAL LOW (ref 4.22–5.81)
RDW: 14.7 % (ref 11.5–15.5)
WBC: 9.4 10*3/uL (ref 4.0–10.5)
nRBC: 0 % (ref 0.0–0.2)

## 2023-09-17 LAB — BLOOD GAS, ARTERIAL
Acid-base deficit: 0.3 mmol/L (ref 0.0–2.0)
Acid-base deficit: 3.6 mmol/L — ABNORMAL HIGH (ref 0.0–2.0)
Acid-base deficit: 4.6 mmol/L — ABNORMAL HIGH (ref 0.0–2.0)
Acid-base deficit: 0.2 mmol/L (ref 0.0–2.0)
Bicarbonate: 22.8 mmol/L (ref 20.0–28.0)
Bicarbonate: 21.5 mmol/L (ref 20.0–28.0)
Bicarbonate: 19.4 mmol/L — ABNORMAL LOW (ref 20.0–28.0)
Bicarbonate: 24.8 mmol/L (ref 20.0–28.0)
FIO2: 60 %
FIO2: 60 %
FIO2: 80 %
MECHVT: 550 mL
MECHVT: 500 mL
MECHVT: 500 mL
Mechanical Rate: 24
Mechanical Rate: 20
Mechanical Rate: 15
O2 Content: 10 L/min
O2 Saturation: 98.9 %
O2 Saturation: 98 %
O2 Saturation: 92.1 %
O2 Saturation: 99 %
PEEP: 8 cmH2O
PEEP: 8 cmH2O
PEEP: 14 cmH2O
Patient temperature: 37
Patient temperature: 37
Patient temperature: 37
Patient temperature: 37
pCO2 arterial: 32 mmHg (ref 32–48)
pCO2 arterial: 38 mmHg (ref 32–48)
pCO2 arterial: 32 mmHg (ref 32–48)
pCO2 arterial: 41 mmHg (ref 32–48)
pH, Arterial: 7.46 — ABNORMAL HIGH (ref 7.35–7.45)
pH, Arterial: 7.36 (ref 7.35–7.45)
pH, Arterial: 7.39 (ref 7.35–7.45)
pH, Arterial: 7.39 (ref 7.35–7.45)
pO2, Arterial: 83 mmHg (ref 83–108)
pO2, Arterial: 85 mmHg (ref 83–108)
pO2, Arterial: 59 mmHg — ABNORMAL LOW (ref 83–108)
pO2, Arterial: 100 mmHg (ref 83–108)

## 2023-09-17 LAB — GLUCOSE, CAPILLARY
Glucose-Capillary: 108 mg/dL — ABNORMAL HIGH (ref 70–99)
Glucose-Capillary: 114 mg/dL — ABNORMAL HIGH (ref 70–99)
Glucose-Capillary: 118 mg/dL — ABNORMAL HIGH (ref 70–99)
Glucose-Capillary: 137 mg/dL — ABNORMAL HIGH (ref 70–99)
Glucose-Capillary: 140 mg/dL — ABNORMAL HIGH (ref 70–99)
Glucose-Capillary: 152 mg/dL — ABNORMAL HIGH (ref 70–99)

## 2023-09-17 LAB — BLOOD GAS, VENOUS
Bicarbonate: 28.2 mmol/L — ABNORMAL HIGH (ref 20.0–28.0)
O2 Saturation: 43.7 mmol/L — ABNORMAL HIGH (ref 0.0–2.0)
Patient temperature: 37
Patient temperature: 43.7 %
pCO2, Ven: 37 mmHg — ABNORMAL LOW (ref 44–60)
pH, Ven: 7.49 — ABNORMAL HIGH (ref 7.25–7.43)
pO2, Ven: 28.2 mmol/L — ABNORMAL HIGH (ref 32–45)

## 2023-09-17 LAB — BASIC METABOLIC PANEL WITH GFR
Anion gap: 8 (ref 5–15)
Anion gap: 8 (ref 5–15)
BUN: 32 mg/dL — ABNORMAL HIGH (ref 8–23)
BUN: 34 mg/dL — ABNORMAL HIGH (ref 8–23)
CO2: 24 mmol/L (ref 22–32)
CO2: 24 mmol/L (ref 22–32)
Calcium: 8.1 mg/dL — ABNORMAL LOW (ref 8.9–10.3)
Calcium: 7.7 mg/dL — ABNORMAL LOW (ref 8.9–10.3)
Chloride: 110 mmol/L (ref 98–111)
Chloride: 110 mmol/L (ref 98–111)
Creatinine, Ser: 1.14 mg/dL (ref 0.61–1.24)
Creatinine, Ser: 1.19 mg/dL (ref 0.61–1.24)
GFR, Estimated: >60 mL/min (ref >60)
GFR, Estimated: >60 mL/min (ref >60)
Glucose, Bld: 147 mg/dL — ABNORMAL HIGH (ref 70–99)
Glucose, Bld: 122 mg/dL — ABNORMAL HIGH (ref 70–99)
Potassium: 5.3 mmol/L — ABNORMAL HIGH (ref 3.5–5.1)
Potassium: 4.5 mmol/L (ref 3.5–5.1)
Sodium: 142 mmol/L (ref 135–145)
Sodium: 142 mmol/L (ref 135–145)

## 2023-09-17 LAB — POTASSIUM: Potassium: 3.5 mmol/L (ref 3.5–5.1)

## 2023-09-17 LAB — PHOSPHORUS: Phosphorus: 2.6 mg/dL (ref 2.5–4.6)

## 2023-09-17 LAB — CULTURE, RESPIRATORY W GRAM STAIN
Culture: NO GROWTH
Gram Stain: NONE SEEN

## 2023-09-17 LAB — CK: Total CK: 198 U/L (ref 49–397)

## 2023-09-17 LAB — COOXEMETRY PANEL
Carboxyhemoglobin: 0.9 % (ref 0.5–1.5)
Methemoglobin: 1 % (ref 0.0–1.5)
O2 Saturation: 73.3 %
Total hemoglobin: 13.1 g/dL (ref 12.0–16.0)
Total oxygen content: 71.8 %

## 2023-09-17 LAB — BRAIN NATRIURETIC PEPTIDE: B Natriuretic Peptide: 968.1 pg/mL — ABNORMAL HIGH (ref 0.0–100.0)

## 2023-09-17 LAB — MAGNESIUM
Magnesium: 2.7 mg/dL — ABNORMAL HIGH (ref 1.7–2.4)
Magnesium: 2.3 mg/dL (ref 1.7–2.4)

## 2023-09-17 LAB — HEPARIN LEVEL (UNFRACTIONATED): Heparin Unfractionated: 0.25 [IU]/mL — ABNORMAL LOW (ref 0.30–0.70)

## 2023-09-17 MED ORDER — FENTANYL CITRATE (PF) 100 MCG/2ML IJ SOLN: 100.0000 ug | Freq: Once | INTRAMUSCULAR | Status: AC

## 2023-09-17 MED ORDER — PROPOFOL 1000 MG/100ML IV EMUL
0.0000 ug/kg/min | INTRAVENOUS | Status: DC
  Administered 2023-09-17: 50 ug/kg/min via INTRAVENOUS
  Administered 2023-09-17: 30 ug/kg/min via INTRAVENOUS
  Administered 2023-09-17 – 2023-09-18 (×6): 50 ug/kg/min via INTRAVENOUS
  Administered 2023-09-18: 35 ug/kg/min via INTRAVENOUS
  Administered 2023-09-19 (×3): 50 ug/kg/min via INTRAVENOUS
  Administered 2023-09-19 (×2): 45 ug/kg/min via INTRAVENOUS
  Administered 2023-09-19 – 2023-09-21 (×11): 50 ug/kg/min via INTRAVENOUS
  Administered 2023-09-21: 30 ug/kg/min via INTRAVENOUS
  Administered 2023-09-21: 50 ug/kg/min via INTRAVENOUS
  Administered 2023-09-22 (×5): 30 ug/kg/min via INTRAVENOUS
  Administered 2023-09-23: 28 ug/kg/min via INTRAVENOUS
  Administered 2023-09-23 – 2023-09-24 (×3): 30 ug/kg/min via INTRAVENOUS
  Filled 2023-09-17 (×41): qty 100

## 2023-09-17 MED ORDER — FAMOTIDINE 20 MG PO TABS: 20.0000 mg | ORAL_TABLET | Freq: Two times a day (BID) | ORAL | Status: DC

## 2023-09-17 MED ORDER — MIDAZOLAM HCL 2 MG/2ML IJ SOLN: 4.0000 mg | Freq: Once | INTRAMUSCULAR | Status: AC

## 2023-09-17 MED ORDER — ATORVASTATIN CALCIUM 20 MG PO TABS
40.0000 mg | ORAL_TABLET | Freq: Every day | ORAL | Status: DC
  Administered 2023-09-17 – 2023-09-29 (×13): 40 mg
  Filled 2023-09-17 (×13): qty 2

## 2023-09-17 MED ORDER — VALPROIC ACID 250 MG/5ML PO SOLN
250.0000 mg | Freq: Three times a day (TID) | ORAL | Status: DC
  Administered 2023-09-17 – 2023-09-29 (×36): 250 mg
  Filled 2023-09-17 (×38): qty 5

## 2023-09-17 MED ORDER — FENTANYL CITRATE PF 50 MCG/ML IJ SOSY
PREFILLED_SYRINGE | INTRAMUSCULAR | Status: AC
  Administered 2023-09-17: 50 ug
  Filled 2023-09-17: qty 1

## 2023-09-17 MED ORDER — NAPHAZOLINE-GLYCERIN 0.012-0.25 % OP SOLN
1.0000 [drp] | Freq: Four times a day (QID) | OPHTHALMIC | Status: DC | PRN
  Administered 2023-09-19 (×3): 2 [drp] via OPHTHALMIC
  Filled 2023-09-17: qty 15

## 2023-09-17 MED ORDER — PROPOFOL 1000 MG/100ML IV EMUL
INTRAVENOUS | Status: AC
  Filled 2023-09-17: qty 100

## 2023-09-17 MED ORDER — ETOMIDATE 2 MG/ML IV SOLN
INTRAVENOUS | Status: AC
Start: 2023-09-17 — End: 2023-09-17
  Administered 2023-09-17: 20 mg
  Filled 2023-09-17: qty 10

## 2023-09-17 MED ORDER — MIDAZOLAM HCL 2 MG/2ML IJ SOLN: 1.0000 mg | INTRAMUSCULAR | Status: DC | PRN

## 2023-09-17 MED ORDER — FENTANYL CITRATE PF 50 MCG/ML IJ SOSY: 25.0000 ug | PREFILLED_SYRINGE | INTRAMUSCULAR | Status: DC | PRN

## 2023-09-17 MED ORDER — ETOMIDATE 2 MG/ML IV SOLN: 20.0000 mg | Freq: Once | INTRAVENOUS | Status: AC

## 2023-09-17 MED ORDER — POLYETHYLENE GLYCOL 3350 17 G PO PACK
17.0000 g | PACK | Freq: Every day | ORAL | Status: DC
Start: 2023-09-17 — End: 2023-09-19
  Administered 2023-09-17 – 2023-09-19 (×3): 17 g
  Filled 2023-09-17 (×3): qty 1

## 2023-09-17 MED ORDER — DOCUSATE SODIUM 50 MG/5ML PO LIQD
100.0000 mg | Freq: Two times a day (BID) | ORAL | Status: DC
  Administered 2023-09-17 – 2023-09-29 (×13): 100 mg
  Filled 2023-09-17 (×12): qty 10

## 2023-09-17 MED ORDER — ACETAMINOPHEN 10 MG/ML IV SOLN
1000.0000 mg | Freq: Once | INTRAVENOUS | Status: AC
  Administered 2023-09-17: 1000 mg via INTRAVENOUS
  Filled 2023-09-17: qty 100

## 2023-09-17 MED ORDER — SUCCINYLCHOLINE CHLORIDE 200 MG/10ML IV SOSY
PREFILLED_SYRINGE | INTRAVENOUS | Status: AC
  Filled 2023-09-17: qty 10

## 2023-09-17 MED ORDER — MIDAZOLAM HCL 2 MG/2ML IJ SOLN
INTRAMUSCULAR | Status: AC
Start: 2023-09-17 — End: 2023-09-17
  Administered 2023-09-17: 4 mg
  Filled 2023-09-17: qty 2

## 2023-09-17 MED ORDER — MORPHINE SULFATE (PF) 2 MG/ML IV SOLN
2.0000 mg | INTRAVENOUS | Status: DC | PRN
  Administered 2023-09-17: 2 mg via INTRAVENOUS
  Filled 2023-09-17 (×4): qty 1

## 2023-09-17 MED ORDER — PROSOURCE TF20 ENFIT COMPATIBL EN LIQD
60.0000 mL | Freq: Two times a day (BID) | ENTERAL | Status: DC
  Administered 2023-09-17 – 2023-09-21 (×9): 60 mL

## 2023-09-17 MED ORDER — FUROSEMIDE 10 MG/ML IJ SOLN: 40.0000 mg | Freq: Every day | INTRAMUSCULAR | Status: DC

## 2023-09-17 MED ORDER — FENTANYL CITRATE PF 50 MCG/ML IJ SOSY
25.0000 ug | PREFILLED_SYRINGE | INTRAMUSCULAR | Status: DC | PRN
  Administered 2023-09-18: 50 ug via INTRAVENOUS
  Administered 2023-09-18 (×2): 100 ug via INTRAVENOUS
  Administered 2023-09-19: 50 ug via INTRAVENOUS
  Administered 2023-09-19: 100 ug via INTRAVENOUS
  Administered 2023-09-19 – 2023-09-26 (×4): 50 ug via INTRAVENOUS
  Administered 2023-09-27 – 2023-09-28 (×9): 100 ug via INTRAVENOUS
  Filled 2023-09-17: qty 2
  Filled 2023-09-17 (×4): qty 1
  Filled 2023-09-17 (×6): qty 2
  Filled 2023-09-17: qty 1
  Filled 2023-09-17: qty 2
  Filled 2023-09-17: qty 1
  Filled 2023-09-17: qty 2
  Filled 2023-09-17: qty 1
  Filled 2023-09-17 (×3): qty 2
  Filled 2023-09-17 (×2): qty 1

## 2023-09-17 MED ORDER — HEPARIN BOLUS VIA INFUSION
1250.0000 [IU] | Freq: Once | INTRAVENOUS | Status: DC
  Filled 2023-09-17: qty 1250

## 2023-09-17 MED ORDER — FUROSEMIDE 10 MG/ML IJ SOLN: 80.0000 mg | Freq: Two times a day (BID) | INTRAMUSCULAR | Status: DC

## 2023-09-17 MED ORDER — KETAMINE HCL 50 MG/5ML IJ SOSY
PREFILLED_SYRINGE | INTRAMUSCULAR | Status: AC
Start: 2023-09-17 — End: 2023-09-17
  Filled 2023-09-17: qty 10

## 2023-09-17 MED ORDER — FREE WATER
30.0000 mL | Status: DC
  Administered 2023-09-17 – 2023-09-25 (×39): 30 mL

## 2023-09-17 MED ORDER — FUROSEMIDE 10 MG/ML IJ SOLN
80.0000 mg | Freq: Two times a day (BID) | INTRAMUSCULAR | Status: DC
  Administered 2023-09-17 (×2): 80 mg via INTRAVENOUS
  Filled 2023-09-17 (×2): qty 8

## 2023-09-17 MED ORDER — VITAL AF 1.2 CAL PO LIQD
1000.0000 mL | ORAL | Status: DC
  Administered 2023-09-17 – 2023-09-21 (×4): 1000 mL

## 2023-09-17 MED ORDER — MORPHINE SULFATE (PF) 2 MG/ML IV SOLN: 2.0000 mg | INTRAVENOUS | Status: DC

## 2023-09-17 MED ORDER — FUROSEMIDE 10 MG/ML IJ SOLN
40.0000 mg | Freq: Once | INTRAMUSCULAR | Status: AC
  Administered 2023-09-17: 40 mg via INTRAVENOUS
  Filled 2023-09-17: qty 4

## 2023-09-17 MED ORDER — POTASSIUM CHLORIDE 20 MEQ PO PACK
40.0000 meq | PACK | Freq: Once | ORAL | Status: AC
  Administered 2023-09-17: 40 meq
  Filled 2023-09-17 (×2): qty 2

## 2023-09-17 MED ORDER — FENTANYL CITRATE PF 50 MCG/ML IJ SOSY
PREFILLED_SYRINGE | INTRAMUSCULAR | Status: AC
Start: 2023-09-17 — End: 2023-09-17
  Filled 2023-09-17: qty 2

## 2023-09-17 MED ORDER — DIAZEPAM 5 MG/ML IJ SOLN
INTRAMUSCULAR | Status: AC
  Filled 2023-09-17: qty 2

## 2023-09-17 MED ORDER — HYDROCORTISONE SOD SUC (PF) 100 MG IJ SOLR
50.0000 mg | Freq: Three times a day (TID) | INTRAMUSCULAR | Status: DC
  Administered 2023-09-17 – 2023-09-19 (×7): 50 mg via INTRAVENOUS
  Filled 2023-09-17 (×6): qty 2

## 2023-09-17 NOTE — Progress Notes (Addendum)
 Nutrition Follow-up  DOCUMENTATION CODES:   Obesity unspecified  INTERVENTION:   Vital 1.2@50ml /hr- Initiate at 31ml/hr and increase by 10ml/hr q 8 hours until goal rate is reached.   ProSource TF 20- Give 60ml BID via tube, each supplement provides 80kcal and 20g of protein.   Propofol : 27.3 ml/hr- provides 721kcal/day   Free water  flushes 30ml q4 hours to maintain tube patency   Regimen provides 2321kcal/day, 130g/day protein and 1177ml/day of free water .   Pt at high refeed risk; recommend monitor potassium, magnesium  and phosphorus labs daily until stable  Daily weights  NUTRITION DIAGNOSIS:   Inadequate oral intake related to inability to eat as evidenced by NPO status. 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  Pt extubated yesterday but was re-intubated overnight. OGT in place. Plan is to initiate tube feeds today. Pt is likely at refeed risk. Per chart, pt is up ~4 lbs since admission. Pt +2.8L on his I & Os. Pulmonary edema noted on CXR; pt is receiving lasix . No BM yet.   Medications reviewed and include: aspirin , coalce, lasix , solu-cortef , protonix , miralax , azithromycin , ceftriaxone , levophed , propofol    Labs reviewed: K 4.5 wnl, BUN 34(H), P 2.6 wnl, Mg 2.7(H) BNP 968.1(H) Cbgs- 118, 108, 137 x 24 hrs   Patient is currently intubated on ventilator support MV: 9.2 L/min Temp (24hrs), Avg:99.7 F (37.6 C), Min:98.7 F (37.1 C), Max:101.1 F (38.4 C)  Propofol : 27.3 ml/hr- provides 721kcal/day   UOP-   Diet Order:   Diet  Order             Diet NPO time specified  Diet effective now                  EDUCATION NEEDS:   Not appropriate for education at this time  Skin:  Skin Assessment: Reviewed RN Assessment  Last BM:  pta  Height:   Ht Readings from Last 1 Encounters:  09/14/23 5\' 6"  (1.676 m)    Weight:   Wt Readings from Last 1 Encounters:  09/17/23 91.1 kg    Ideal Body Weight:  64.5 kg  BMI:  Body mass index is 32.42 kg/m.  Estimated Nutritional Needs:   Kcal:  2015kcal/day  Protein:  >130g/day  Fluid:  1.6-1.8L/day  Torrance Freestone MS, RD, LDN If unable to be reached, please send secure chat to "RD inpatient" available from 8:00a-4:00p daily

## 2023-09-17 NOTE — Progress Notes (Signed)
   09/17/23 0550  Spiritual Encounters  Type of Visit Initial  Care provided to: Pt and family (Wife, Carmelina Chinchilla at bedside)  Psychologist, sport and exercise partners present during Programmer, systems  Referral source Nurse (RN/NT/LPN)  Reason for visit Urgent spiritual support  OnCall Visit Yes  Spiritual Framework  Presenting Themes Meaning/purpose/sources of inspiration;Goals in life/care;Values and beliefs;Other (comment) (Wife requested prayer for Pt during intubation)  Family Stress Factors Other (Comment) (Pt and family have experienced recent loss)  Interventions  Spiritual Care Interventions Made Established relationship of care and support;Compassionate presence;Reflective listening;Prayer;Encouragement;Other (comment) (for Wife)  Intervention Outcomes  Outcomes Connection to spiritual care;Awareness around self/spiritual resourses;Connection to values and goals of care;Awareness of support;Connected to spiritual community (for Wife)

## 2023-09-17 NOTE — Progress Notes (Signed)
   09/17/23 0700  Spiritual Encounters  Type of Visit Follow up  Care provided to: Pt and family Carmelina Chinchilla (wife) at bedside; Pt is a retired Scientist, physiological)  Psychologist, sport and exercise partners present during Advertising account executive  Reason for visit Routine spiritual support  OnCall Visit Yes  Spiritual Framework  Presenting Themes Meaning/purpose/sources of inspiration;Values and beliefs;Impactful experiences and emotions;Other (comment) (for Family)  Interventions  Spiritual Care Interventions Made Compassionate presence;Reflective listening;Normalization of emotions;Explored values/beliefs/practices/strengths;Encouragement;Other (comment) (for Family)  Intervention Outcomes  Outcomes Connection to spiritual care;Awareness around self/spiritual resourses;Awareness of support;Other (comment) (for Family)

## 2023-09-17 NOTE — Consult Note (Signed)
 PHARMACY CONSULT NOTE - ELECTROLYTES  Pharmacy Consult for Electrolyte Monitoring and Replacement   Recent Labs: Potassium (mmol/L)  Date Value  09/17/2023 5.3 (H)  05/23/2014 4.1   Magnesium  (mg/dL)  Date Value  59/56/3875 2.7 (H)  09/13/2013 1.9   Calcium  (mg/dL)  Date Value  64/33/2951 8.1 (L)   Calcium , Total (mg/dL)  Date Value  88/41/6606 8.5   Albumin (g/dL)  Date Value  30/16/0109 2.8 (L)  05/23/2014 3.7   Phosphorus (mg/dL)  Date Value  32/35/5732 2.6   Sodium (mmol/L)  Date Value  09/17/2023 142  05/23/2014 143   Height: 5\' 6"  (167.6 cm) Weight: 91.1 kg (200 lb 13.4 oz) IBW/kg (Calculated) : 63.8 Estimated Creatinine Clearance: 59.2 mL/min (by C-G formula based on SCr of 1.14 mg/dL).  Assessment  Blake [name] is a 73 y.o. male presenting with acute respiratory failure and shock. 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 Pharmacy has been consulted to monitor and replace electrolytes.  Diet: NPO, intubated MIVF: N/A Pertinent medications: Lasix  40 mg IV daily  Goal of Therapy: Electrolytes within normal limits  Plan:  No electrolyte replacement indicated Labs tomorrow  Thank you for allowing pharmacy to be a part of this patient's care.  Page Boast 09/17/2023 8:08 AM

## 2023-09-17 NOTE — Procedures (Signed)
 Intubation Procedure Note  Blake [name]  [id number]  Jan 19, 1951  Date:09/17/23  Time:6:13 AM   Provider Performing:Toyoko Silos L [place]    Procedure: Intubation (31500)  Indication(s) Respiratory Failure  Consent Unable to obtain consent due to emergent nature of procedure.   Anesthesia Etomidate, Versed , and Fentanyl    Time Out Verified patient identification, verified procedure, site/side was marked, verified correct patient position, special equipment/implants available, medications/allergies/relevant history reviewed, required imaging and test results available.   Sterile Technique Usual hand hygeine, masks, and gloves were used   Procedure Description Patient positioned in bed supine.  Sedation given as noted above.  Patient was intubated with endotracheal tube using Glidescope.  View was Grade 1 full glottis .  Number of attempts was 1.  Colorimetric CO2 detector was consistent with tracheal placement.   Complications/Tolerance None; patient tolerated the procedure well. Chest X-ray is ordered to verify placement.   EBL Minimal   Specimen(s) None   Eliott Guess, AGACNP-BC Acute Care Nurse Practitioner [facility]   [phone] / [phone] Please see Amion for details.

## 2023-09-17 NOTE — Consult Note (Addendum)
 Advanced Heart Failure Team Consult Note   Primary Physician: Rory Collard, MD Cardiologist: Dr. Beau Bound  Reason for Consultation: Acute on Chronic Systolic Heart Failure HPI:    Blake [name] is seen today for evaluation of acute on chronic systolic heart failure at the request of Dr. Parks Bollman.   Blake [name] is a 73 y.o. 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   Myoview SPECT 3/20 showed EF 30% and borderlins ischemia with moderate to severe cardiomyopathy possibly severe.   [facility] 2/21 showed severe single vessel CAD in proxLAD with 75% lesion treated with DES. RA normal, PW 8, PA 16 CO (fick) 6.83.  Echo 6/23 with EF 35%, mod LVH, normal RV, triv MR, mild AR  Had been seen by PCP 06/15/23 for chest pain with exertion and joint pain. He was sent for knee and spine XR.  Presented to [facility] ED 5/26 with his wife after being seen in urgent care for flu-like symptoms day prior and treated for URI with cefdinir. Later that night developed AMS and shortness of breath with associated fever. On arrival to ED HR 130s, BP 150s/110s, O2sat 70s on NRB requiring intubation and temp of 103.39F. Code sepsis called and he was started on IV abx and given 1L IV. Labs notable for K 3.9, Cr 1.03, BNP 573, hs-trop [phone], LA 1.5>2.6, WBC 9.3, hgb 15.6. ECG showed ST 144 bpm with transient ST changes compared to prior EKGs. All cultures remain negative from admission. Positive for Rhinovirus. CXR with significant pulm edema. Admitted to the ICU and started on IV heparin , NE in the setting of sedation, and stress dose steroids.   Echo ordered showing EF 35-40% with RWMA, G1DD, normal RV, mod MR.  Extubated yesterday to HFNC, required re-intubated this morning. Heart Failure consulted for assistance. Remains  sedated on low-dose NE at 2.   SH: Never smoked. No ETOH or drug use.   Home Medications Prior to Admission medications   Medication Sig Start Date End Date Taking? Authorizing Provider  acetaminophen  (TYLENOL ) 500 MG tablet Take 1,000 mg by mouth every 6 (six) hours as needed for mild pain or moderate pain.   Yes [provider]  aspirin  EC 81 MG tablet Take 81 mg by mouth daily.   Yes [provider]  B Complex-C (SUPER B COMPLEX PO) Take 1 tablet by mouth daily.   Yes [provider]  Calcium  Carbonate-Vitamin D  600-200 MG-UNIT CAPS Take 1 tablet by mouth 2 (two) times daily.    Yes [provider]  carvedilol  (COREG ) 3.125 MG tablet Take 3.125 mg by mouth daily.  11/03/18  Yes [provider]  cefdinir (OMNICEF) 300 MG capsule Take 300 mg by mouth 2 (two) times daily. 09/14/23 09/21/23 Yes [provider]  clindamycin  (CLEOCIN  T) 1 % lotion Apply 1 application topically 2 (two) times daily as needed.    Yes [provider]  clopidogrel  (PLAVIX ) 75 MG tablet Take 75 mg by mouth daily. 09/27/18  Yes [provider]  divalproex  (DEPAKOTE  ER) 250 MG 24 hr tablet Total of 750 mg daily. Take along with 500 mg tab 08/24/23  Yes Hisada, Ivan Marion, MD  divalproex  (DEPAKOTE  ER) 500 MG 24 hr tablet Take 1 tablet (500 mg total) by mouth daily. 08/13/23 10/12/23 Yes Todd Fossa, MD  DULoxetine  (CYMBALTA ) 60 MG capsule Take 60 mg by mouth 2 (two) times daily.  11/17/18  Yes [provider]  FIBER PO Take 1 tablet by mouth 2 (two) times daily.    Yes [provider]  furosemide  (LASIX ) 20 MG tablet Take 20 mg by mouth daily. 11/03/18  Yes [provider]  latanoprost (XALATAN) 0.005 % ophthalmic solution Place 1 drop into the left eye daily. 11/06/21  Yes [provider]  losartan (COZAAR) 100 MG tablet Take 100 mg by mouth daily.   Yes [provider]  Melatonin 5 MG TABS Take 10 mg by mouth at bedtime.  30 min before bed   Yes [provider]  omeprazole (PRILOSEC) 20 MG capsule Take 20 mg by mouth daily.   Yes [provider]  Probiotic Product (PROBIOTIC DAILY PO) Take 1 tablet by mouth daily. Senior   Yes [provider]  silodosin  (RAPAFLO ) 8 MG CAPS capsule TAKE 1 CAPSULE BY MOUTH DAILY WITH BREAKFAST Patient taking differently: Take 8 mg by mouth daily. 03/16/23  Yes Stoioff, Kizzie Perks, MD  sodium chloride  (OCEAN) 0.65 % SOLN nasal spray Place 2 sprays into both nostrils as needed for congestion.   Yes [provider]  tadalafil  (CIALIS ) 5 MG tablet Take 1 tablet by mouth once daily 08/03/23  Yes Stoioff, Scott C, MD  SYRINGE-NEEDLE, DISP, 3 ML (B-D 3CC LUER-LOK SYR 22GX1-1/2) 22G X 1-1/2" 3 ML MISC USE AS DIRECTED WITH TESTOSTERONE  08/24/23   Stoioff, Kizzie Perks, MD  testosterone  cypionate (DEPOTESTOSTERONE CYPIONATE) 200 MG/ML injection Inject 0.3 mLs (60 mg total) into the muscle once a week. DISCARD REMAINING AS THESE ARE SINGLE USE VIALS. Patient taking differently: Inject 60 mg into the muscle every 14 (fourteen) days. EVERY OTHER SATURDAY. DISCARD REMAINING AS THESE ARE SINGLE USE VIALS. 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   Coronary artery disease    Dementia (HCC)    Depression    Disorder of male genital organs 07/11/2013   Dyspnea    Encounter for long-term (current) use of medications 11/21/2014   Erectile dysfunction 07/11/2013   Executive function deficit 03/06/2016   GERD (gastroesophageal reflux disease)    Headache     stress   Hearing loss    Hyperlipidemia    Hypertension    Hypogonadism, 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   Major depression, recurrent, chronic (HCC) 03/06/2016   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   Mixed hyperlipidemia 02/02/2014   Myocardial infarction (HCC) 08/2013   OCD (obsessive compulsive disorder)    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   Other specified disorder of male genital organs(608.89) 07/11/2013   Prostatic hypertrophy    Pseudophakia of both eyes 04/20/2015   Raynaud's disease    Reduced libido 07/11/2013   Repeated falls    weak left ankle   Scoliosis    Stroke (HCC) 2/16   "light"   Synovitis  knees, ankles    Past Surgical History: Past Surgical History:  Procedure Laterality Date   BACK SURGERY  2011   rods and screws   CLOSED REDUCTION NASAL FRACTURE Bilateral 06/11/2018   Procedure: CLOSED REDUCTION NASAL FRACTURE;  Surgeon: [name], MD;  Location: [facility] ORS;  Service: ENT;  Laterality: Bilateral;   COLONOSCOPY  2015   CORONARY ANGIOPLASTY     2015 stent   CORONARY STENT INTERVENTION N/A 06/08/2019   Procedure: CORONARY STENT INTERVENTION;  Surgeon: Antonette Batters, MD;  Location: [facility];  Service: Cardiovascular;  Laterality: N/A;   CYSTOSCOPY  1984   EYE SURGERY Bilateral 2005,2006,2009   detached retina, cataract   FOOT ARTHRODESIS Left 05/30/2015   Procedure: ARTHRODESIS FOOT STJ LT FOOT;  Surgeon: Anell Baptist, DPM;  Location: [facility];  Service: Podiatry;  Laterality: Left;  WITH POPLITEAL BLOCK   FOOT SURGERY Left    HERNIA REPAIR Right 1969   inguinal   LEFT HEART CATH AND CORONARY ANGIOGRAPHY Left 06/08/2019   Procedure: LEFT HEART CATH AND CORONARY ANGIOGRAPHY;  Surgeon: Antonette Batters, MD;  Location: [facility];  Service: Cardiovascular;  Laterality: Left;   SEPTOPLASTY Bilateral 06/11/2018    Procedure: SEPTOPLASTY;  Surgeon: [name], MD;  Location: [facility] OR[facility];  Service: ENT;  Laterality: Bilateral;   SHOULDER SURGERY Right 2004   skull surgery     TOE SURGERY Right 2009   TONSILLECTOMY  2008    Family History: Family History  Problem Relation Age of Onset   Asperger's syndrome Mother     Social History: Social History   Socioeconomic History   Marital status: Married    Spouse name: cindy   Number of children: 1   Years of education: Not on file   Highest education level: Professional school degree (e.g., MD, DDS, DVM, JD)  Occupational History   Not on file  Tobacco Use   Smoking status: Never   Smokeless tobacco: Never  Vaping Use   Vaping status: Never Used  Substance and Sexual Activity   Alcohol use: No   Drug use: No   Sexual activity: Not Currently    Birth control/protection: None  Other Topics Concern   Not on file  Social History Narrative   Not on file   Social Drivers of Health   Financial Resource Strain: Not on file  Food Insecurity: No Food Insecurity (09/15/2023)   Hunger Vital Sign    Worried About Running Out of Food in the Last Year: Never true    Ran Out of Food in the Last Year: Never true  Transportation Needs: No Transportation Needs (09/15/2023)   PRAPARE - Administrator, Civil Service (Medical): No    Lack of Transportation (Non-Medical): No  Physical Activity: Not on file  Stress: Not on file  Social Connections: Patient Unable To Answer (09/15/2023)   Social Connection and Isolation Panel [NHANES]    Frequency of Communication with Friends and Family: Patient unable to answer    Frequency of Social Gatherings with Friends and Family: Patient unable to answer    Attends Religious Services: Patient unable to answer    Active Member of Clubs or Organizations: Patient unable to answer    Attends Banker Meetings: Patient unable to answer    Marital Status: Patient unable to answer    Allergies:   Allergies  Allergen Reactions   Promethazine Other (See Comments)    Severe neuroleptic malignant syndrome  requiring intubation   Mirtazapine Other (See Comments)    Other reaction(s): Other (See Comments) Irritability/anger, increased appetite, worsening depression Irritability/anger, increased appetite, worsening depression     Objective:    Vital Signs:   Temp:  [98.7 F (37.1 C)-101.1 F (38.4 C)] 99.2 F (37.3 C) (05/29 0745) Pulse Rate:  [49-133] 89 (05/29 0745) Resp:  [8-37] 22 (05/29 0745) BP: (88-142)/(58-98) 109/75 (05/29 0745) SpO2:  [73 %-100 %] 95 % (05/29 0803) Arterial Line BP: (85-174)/(48-129) 132/129 (05/29 0600) FiO2 (%):  [40 %-100 %] 80 % (05/29 0803) Weight:  [91.1 kg] 91.1 kg (05/29 0500) Last BM Date :  (PTA)  Weight change: Filed Weights   09/15/23 0352 09/16/23 0342 09/17/23 0500  Weight: 91 kg 91.1 kg 91.1 kg   Intake/Output:  Intake/Output Summary (Last 24 hours) at 09/17/2023 0806 Last data filed at 09/17/2023 0728 Gross per 24 hour  Intake 1366.76 ml  Output 321 ml  Net 1045.76 ml    Physical Exam    General: Critically-ill appearing. No distress on vent Cardiac: JVP elevated. S1 and S2 present. No murmurs or rub. Resp: diminished throughout + ETT Extremities: Warm and clammy.  No peripheral edema.  Neuro: Sedated  Telemetry   SR 90s (personally reviewed)  EKG    ST 101 bpm + a PVC (personally reviewed)  Labs   Basic Metabolic Panel: Recent Labs  Lab 09/14/23 1835 09/15/23 0308 09/15/23 0612 09/15/23 1646 09/16/23 0359 09/17/23 0523  NA 138 138 136 138 140 142  K 3.9 3.6 2.8* 5.0 4.4 5.3*  CL 105 107 107 109 111 110  CO2 21* 18* 20* 22 20* 24  GLUCOSE 127* 135* 145* 147* 136* 147*  BUN 15 18 17 17 18  32*  CREATININE 1.03 1.15 1.05 1.08 0.88 1.14  CALCIUM  8.9 7.5* 7.7* 7.7* 6.9* 8.1*  MG 1.8  --  2.2  --  2.3 2.7*  PHOS  --   --  2.4*  --  3.0 2.6   Liver Function Tests: Recent Labs  Lab 09/14/23 1835  09/15/23 0308 09/16/23 0359  AST 25 76* 59*  ALT 24 26 26   ALKPHOS 55 40 34*  BILITOT 0.9 0.8 0.5  PROT 7.2 5.8* 5.5*  ALBUMIN 4.0 3.1* 2.8*   CBC: Recent Labs  Lab 09/14/23 1835 09/15/23 0244 09/15/23 0612 09/16/23 0359 09/17/23 0523  WBC 9.3 6.6  --  13.3* 9.4  NEUTROABS 7.5  --   --   --  7.6  HGB 15.6 13.8  --  12.8* 13.1  HCT 45.0 40.3  --  39.2 39.4  MCV 91.8 92.9  --  96.1 96.3  PLT 222 178 172 163 170   Cardiac Enzymes: Recent Labs  Lab 09/14/23 1835 09/15/23 0308 09/17/23 0523  CKTOTAL 136 612* 198   BNP (last 3 results) Recent Labs    09/14/23 1835 09/17/23 0523  BNP 573.5* 968.1*   CBG: Recent Labs  Lab 09/16/23 1618 09/16/23 1937 09/16/23 2333 09/17/23 0323 09/17/23 0735  GLUCAP 122* 143* 149* 137* 108*   Coagulation Studies: Recent Labs    09/15/23 0244  LABPROT 15.6*  INR 1.2   Imaging   DG Abd 1 View Result Date: 09/17/2023 CLINICAL DATA:  OG tube placement. EXAM: ABDOMEN - 1 VIEW COMPARISON:  09/14/2023 FINDINGS: OG tube tip is positioned in the mid stomach with proximal side port below the expected location of the GE junction. Nonspecific bowel gas pattern within the visualized abdomen. IMPRESSION: OG tube tip  is positioned in the mid stomach. Electronically Signed   By: Donnal Fusi M.D.   On: 09/17/2023 06:42   DG Chest Port 1 View Result Date: 09/17/2023 CLINICAL DATA:  73 year old male intubated, central line placement, bilateral airspace opacity. EXAM: PORTABLE CHEST 1 VIEW COMPARISON:  Portable chest 09/15/2023 and earlier. FINDINGS: Portable AP supine view at 0610 hours. Endotracheal tube projects over the midline trachea in good position between the clavicles and carina. Enteric tube has been removed. Stable right IJ approach central line. Stable left chest AICD. Similar lung volumes but diffuse bilateral increased pulmonary opacity with combined reticulonodular and vague/airspace appearance. No pneumothorax or pleural effusion.  No air bronchograms. Stable cardiac size and mediastinal contours. No acute osseous abnormality identified. Paucity of bowel gas in the visible abdomen. IMPRESSION: 1. Satisfactory ET tube. Enteric tube removed. Stable right IJ approach central line. 2. Increased bilateral pulmonary opacity from two days ago, differential considerations include progressive pulmonary edema, bilateral infection, ARDS. No pleural effusion is evident. Electronically Signed   By: Marlise Simpers M.D.   On: 09/17/2023 06:36   Medications:    Current Medications:  aspirin   81 mg Per Tube Daily   atorvastatin  40 mg Per Tube Daily   Chlorhexidine  Gluconate Cloth  6 each Topical Daily   [COMPLETED] diazepam       docusate  100 mg Per Tube BID   docusate  100 mg Per Tube BID   famotidine   20 mg Per Tube BID   [START ON 09/18/2023] furosemide   40 mg Intravenous Daily   heparin   1,250 Units Intravenous Once   hydrocortisone sod succinate (SOLU-CORTEF) inj  100 mg Intravenous Q12H   ipratropium-albuterol  3 mL Nebulization Q6H   ketamine HCl        morphine injection  2 mg Intravenous STAT   mouth rinse  15 mL Mouth Rinse Q2H   pantoprazole  (PROTONIX ) IV  40 mg Intravenous QHS   polyethylene glycol  17 g Per Tube Daily   Infusions:  azithromycin Stopped (09/16/23 1132)   cefTRIAXone (ROCEPHIN)  IV Stopped (09/16/23 1024)   dexmedetomidine (PRECEDEX) IV infusion 1.405 mcg/kg/hr (09/17/23 0316)   heparin  Stopped (09/17/23 0623)   norepinephrine (LEVOPHED) Adult infusion 2 mcg/min (09/17/23 0316)   propofol  (DIPRIVAN ) infusion 35 mcg/kg/min (09/17/23 7628)   valproate sodium 250 mg (09/17/23 0651)   Patient Profile   Blake [name] is a 73 y.o. 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   Assessment/Plan   1. Acute on Chronic Systolic Heart  Failure: ICM  - first noted by Myoview stress in 2020 with evidence of ischemia and EF 30% - s/p [employer] ICD - Echo this admission with stable function EF 35-40%, normal RV, mod LVH, G1DD - BNP 968 today with severe pulm edema - Will start CVP/Coox monitoring - start Lasix  80 mg IV bid - continue NE for BP support, wean as able - GDMT limited by hypotension and pressor use - will need LHC once stable  2. NSTEMI - h/o severe single vessel CAD in LAD s/p DES in 2021 - presented with hs-trop 106 > 7453 > 15306  - initial EKG with ST and transient ST changes, resolved on repeat - continue heparin  gtt - continue ASA + statin per tube - will need LHC once stabilizes  3.  ARDS: in the setting of viral PNA and pulmonary edema - CXR with progressive ARDS, recommend daily CXR - intubated, would keep ETT; vent management per CCM - febrile overnight Tmax 101.1 - diuresis as above - on IV azithro and rocephin per CCM - on stress-dose steroids per CCM  4. Hyperkalemia - K 5.3 today - repeat BMET this afternoon - dose with Lokelma if remains elevated  Length of Stay: 3  CRITICAL CARE Performed by: [place] Lee  Total critical care time: 12 minutes  Critical care time was exclusive of separately billable procedures and treating other patients.  Critical care was necessary to treat or prevent imminent or life-threatening deterioration.  Critical care was time spent personally by me on the following activities: development of treatment plan with patient and/or surrogate as well as nursing, discussions with consultants, evaluation of patient's response to treatment, examination of patient, obtaining history from patient or surrogate, ordering and performing treatments and interventions, ordering and review of laboratory studies, ordering and review of radiographic studies, pulse oximetry and re-evaluation of patient's condition.  [place] Lee, NP  09/17/2023, 8:06 AM  Advanced Heart Failure  Team Pager [phone] (M-F; 7a - 5p)  Please contact [facility] for night-coverage after hours (4p -7a ) and weekends on [email or web address]  Patient seen with NP.  I formulated the plan and agree with the above note.   Patient was admitted 5/26 with hypoxemic respiratory failure, dyspnea, and chest pain.  He had a high fever initially.  CTA chest showed no PE, there was peri-hilar airspace disease, infection vs edema.  Lactate 2.6 => 1.4. Hs-TnI elevated to 15,300 without acute ECG changes. He was initially intubated and started on ceftriaxone/azithromycin + heparin  gtt.  He required norepinephrine, suspected primarily septic shock.  He takes 20 mg daily of Lasix  at home.  This was held with concern for septic shock. Respiratory virus panel positive for rhinovirus, suspected viral infection with superimposed bacterial PNA.    Echo showed EF 35-40% with normal RV and moderate MR, this is consistent with prior echoes.   Patient was extubated yesterday but developed respiratory distress overnight and was re-intubated.  With re-intubation, he was given 1 dose of IV Lasix .  CXR this morning is much worse with severe diffuse bilateral airspace disease.  Temp to 101.1 this morning, FiO2 on vent now 0.8.  Procalcitonin 3.43.   CVP line was set up, CVP 19-20 currently.  Co-ox 73%, now on NE 2.   General: Intubated Neck: JVP difficult but appears elevated, no thyromegaly or thyroid nodule.  Lungs: Clear to auscultation bilaterally with normal respiratory effort. CV: Nondisplaced PMI.  Heart regular S1/S2, no S3/S4, no murmur.  No peripheral edema.  No carotid bruit.  Normal pedal pulses.  Abdomen: Soft, nontender, no hepatosplenomegaly, no distention.  Skin: Intact without lesions or rashes.  Neurologic: He will open eyes and follow commands.  Extremities: No clubbing or cyanosis.  HEENT: Normal.   1. Acute hypoxemic respiratory failure: In setting of high fever, respiratory virus panel + for rhinovirus.   Suspect viral infection with secondary bacterial PNA (possible aspiration).  However, I think that there is also a significant component of pulmonary edema.  CXR much worse this morning when she was re-intubated.  Concern for ARDS based on appearance, but hope we can improve with diuresis.  - Lasix  80 mg IV bid, replace K.  - Continue ceftriaxone/azithromycin for CAP coverage per CCM.  2. Shock: This may be primarily septic shock/shock related to sedation (  propofol ).  Procalcitonin elevated at 3.43. Weaning pressor, NE now down to 2. Lactate has cleared.  - Continue abx as above.  - Continue to wean NE as tolerated.  - Stress dose steroids per CCM.  3. Acute on chronic systolic CHF: Ischemic cardiomyopathy.  Echo this admission with EF 35-40% with normal RV and moderate MR, this is consistent with prior echoes. Initially, Lasix  was held and he had volume rescuscitation.  CVP 19-20 today, CXR concerning for pulmonary edema.  Co-ox adequate at 73%.  - Wean NE as BP tolerates.  - Lasix  80 mg IV bid, he will need significant diuresis. - Once BP stable off NE, can start GDMT titration.  4. ID: Rhinovirus infection, suspect secondary bacterial PNA with sepsis, procalcitonin 3.43.  - Continue ceftriaxone/azithromycin.  5. CAD: NSTEMI with [place]-TnI up to 15[address].  He did have some chest pain at time of admission.  Echo was unchanged with EF 35-40%.  He was in shock initially and has developed volume overload, but TnI seems high for demand ischemia alone.  Cannot rule out ACS with plaque rupture.  - Continue heparin  gtt for now.  - Continue ASA 81.  - Start statin.  - Once he is extubated and stabilized, would favor coronary angiography.   CRITICAL CARE Performed by: Peder Bourdon  Total critical care time: 70 minutes  Critical care time was exclusive of separately billable procedures and treating other patients.  Critical care was necessary to treat or prevent imminent or life-threatening  deterioration.  Critical care was time spent personally by me on the following activities: development of treatment plan with patient and/or surrogate as well as nursing, discussions with consultants, evaluation of patient's response to treatment, examination of patient, obtaining history from patient or surrogate, ordering and performing treatments and interventions, ordering and review of laboratory studies, ordering and review of radiographic studies, pulse oximetry and re-evaluation of patient's condition.  Peder Bourdon 09/17/2023 11:27 AM

## 2023-09-17 NOTE — Progress Notes (Signed)
**Note Blake-Identified via Obfuscation**  [facility] CLINIC CARDIOLOGY PROGRESS NOTE       Patient ID: DEMORIO [name] [name] MRN: [id number] DOB/AGE: 11/12/1950 73 y.o.  Admit date: 09/14/2023 Referring Physician Dr. Vergia Glasgow Primary Physician Rory Collard, MD Primary Cardiologist Dr. Beau Bound Reason for Consultation Elevated trops, NSTEMI  HPI: Blake [name] is a 73 y.o. male  with a past medical history of coronary artery disease s/p stent to LAD, ischemic cardiomyopathy s/p ICD ([employer]), chronic HFrEF, OSA (on CPAP), hx CVA who presented to the ED on 09/14/2023 for dyspnea, chest pain and AMS. Patient recently seen at urgent care for nonproductive cough, chills/diaphoresis Troponins found to be elevated.  Cardiology was consulted for further evaluation.   Interval History: -Patient seen and examined this AM. Patient intubated and remains on levo gtt with variable BP and HR. Patient was extubated yesterday afternoon and re-intubated early this AM.  -Overnight Tele showed no significant events.  -Total UOP 1L yesterday with stable renal function. - K 5.3 -Consulted Advanced heart failure.    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   Coronary artery disease    Dementia (HCC)    Depression    Disorder of male genital organs 07/11/2013   Dyspnea    Encounter for long-term (current) use of medications 11/21/2014   Erectile dysfunction 07/11/2013   Executive function deficit 03/06/2016   GERD (gastroesophageal reflux disease)    Headache    stress   Hearing loss     Hyperlipidemia    Hypertension    Hypogonadism, 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   Major depression, recurrent, chronic (HCC) 03/06/2016   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   Mixed hyperlipidemia 02/02/2014   Myocardial infarction (HCC) 08/2013   OCD (obsessive compulsive disorder)    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   Other specified disorder of male genital organs(608.89) 07/11/2013   Prostatic hypertrophy    Pseudophakia of both eyes 04/20/2015   Raynaud's disease    Reduced libido 07/11/2013   Repeated falls    weak left ankle   Scoliosis    Stroke (HCC) 2/16   "light"   Synovitis    knees, ankles    Past Surgical History:  Procedure Laterality Date   BACK SURGERY  2011   rods and screws   CLOSED REDUCTION NASAL FRACTURE Bilateral 06/11/2018   Procedure: CLOSED REDUCTION NASAL FRACTURE;  Surgeon: [name], MD;  Location: [facility] ORS;  Service: ENT;  Laterality: Bilateral;   COLONOSCOPY  2015   CORONARY ANGIOPLASTY     2015 stent   CORONARY STENT INTERVENTION N/A 06/08/2019   Procedure: CORONARY STENT INTERVENTION;  Surgeon: Antonette Batters, MD;  Location: [facility] INVASIVE CV LAB;  Service: Cardiovascular;  Laterality: N/A;   CYSTOSCOPY  1984   EYE SURGERY Bilateral 2005,2006,2009   detached retina, cataract   FOOT ARTHRODESIS Left 05/30/2015   Procedure: ARTHRODESIS FOOT STJ LT FOOT;  Surgeon: Austine Lefort  Althea Atkinson, DPM;  Location: [facility];  Service: Podiatry;  Laterality: Left;  WITH POPLITEAL BLOCK   FOOT SURGERY Left    HERNIA REPAIR Right 1969   inguinal   LEFT HEART CATH AND CORONARY ANGIOGRAPHY Left 06/08/2019   Procedure: LEFT HEART CATH AND CORONARY ANGIOGRAPHY;  Surgeon: Antonette Batters, MD;  Location: [facility] INVASIVE CV LAB;  Service: Cardiovascular;  Laterality: Left;   SEPTOPLASTY Bilateral 06/11/2018   Procedure: SEPTOPLASTY;  Surgeon: [name],  MD;  Location: [facility] ORS;  Service: ENT;  Laterality: Bilateral;   SHOULDER SURGERY Right 2004   skull surgery     TOE SURGERY Right 2009   TONSILLECTOMY  2008    Medications Prior to Admission  Medication Sig Dispense Refill Last Dose/Taking   acetaminophen  (TYLENOL ) 500 MG tablet Take 1,000 mg by mouth every 6 (six) hours as needed for mild pain or moderate pain.   Unknown   aspirin  EC 81 MG tablet Take 81 mg by mouth daily.   09/14/2023 at  6:00 AM   B Complex-C (SUPER B COMPLEX PO) Take 1 tablet by mouth daily.   09/14/2023 Morning   Calcium  Carbonate-Vitamin D  600-200 MG-UNIT CAPS Take 1 tablet by mouth 2 (two) times daily.    09/14/2023 Morning   carvedilol  (COREG ) 3.125 MG tablet Take 3.125 mg by mouth daily.    09/14/2023 Morning   cefdinir (OMNICEF) 300 MG capsule Take 300 mg by mouth 2 (two) times daily.   09/14/2023 Noon   clindamycin  (CLEOCIN  T) 1 % lotion Apply 1 application topically 2 (two) times daily as needed.    Unknown   clopidogrel  (PLAVIX ) 75 MG tablet Take 75 mg by mouth daily.   09/14/2023 at  6:00 AM   divalproex  (DEPAKOTE  ER) 250 MG 24 hr tablet Total of 750 mg daily. Take along with 500 mg tab 30 tablet 0 09/14/2023 at  8:00 AM   divalproex  (DEPAKOTE  ER) 500 MG 24 hr tablet Take 1 tablet (500 mg total) by mouth daily. 30 tablet 1 09/14/2023 at  8:00 AM   DULoxetine  (CYMBALTA ) 60 MG capsule Take 60 mg by mouth 2 (two) times daily.    09/14/2023 at  3:30 AM   FIBER PO Take 1 tablet by mouth 2 (two) times daily.    09/14/2023 Morning   furosemide  (LASIX ) 20 MG tablet Take 20 mg by mouth daily.   09/14/2023 Morning   latanoprost (XALATAN) 0.005 % ophthalmic solution Place 1 drop into the left eye daily.   09/14/2023 Morning   losartan (COZAAR) 100 MG tablet Take 100 mg by mouth daily.   09/14/2023 Morning   Melatonin 5 MG TABS Take 10 mg by mouth at bedtime. 30 min before bed   09/13/2023 Bedtime   omeprazole (PRILOSEC) 20 MG capsule Take 20 mg by mouth daily.   09/14/2023 Morning    Probiotic Product (PROBIOTIC DAILY PO) Take 1 tablet by mouth daily. Senior   09/14/2023 Morning   silodosin  (RAPAFLO ) 8 MG CAPS capsule TAKE 1 CAPSULE BY MOUTH DAILY WITH BREAKFAST (Patient taking differently: Take 8 mg by mouth daily.) 90 capsule 3 09/13/2023 at  6:00 PM   sodium chloride  (OCEAN) 0.65 % SOLN nasal spray Place 2 sprays into both nostrils as needed for congestion.   09/14/2023 Morning   tadalafil  (CIALIS ) 5 MG tablet Take 1 tablet by mouth once daily 90 tablet 0 09/14/2023 Morning   SYRINGE-NEEDLE, DISP, 3 ML (B-D 3CC LUER-LOK SYR 22GX1-1/2) 22G X 1-1/2" 3 ML MISC  USE AS DIRECTED WITH TESTOSTERONE  12 each 20    testosterone  cypionate (DEPOTESTOSTERONE CYPIONATE) 200 MG/ML injection Inject 0.3 mLs (60 mg total) into the muscle once a week. DISCARD REMAINING AS THESE ARE SINGLE USE VIALS. (Patient taking differently: Inject 60 mg into the muscle every 14 (fourteen) days. EVERY OTHER SATURDAY. DISCARD REMAINING AS THESE ARE SINGLE USE VIALS.) 5 mL 0    Social History   Socioeconomic History   Marital status: Married    Spouse name: cindy   Number of children: 1   Years of education: Not on file   Highest education level: Professional school degree (e.g., MD, DDS, DVM, JD)  Occupational History   Not on file  Tobacco Use   Smoking status: Never   Smokeless tobacco: Never  Vaping Use   Vaping status: Never Used  Substance and Sexual Activity   Alcohol use: No   Drug use: No   Sexual activity: Not Currently    Birth control/protection: None  Other Topics Concern   Not on file  Social History Narrative   Not on file   Social Drivers of Health   Financial Resource Strain: Not on file  Food Insecurity: No Food Insecurity (09/15/2023)   Hunger Vital Sign    Worried About Running Out of Food in the Last Year: Never true    Ran Out of Food in the Last Year: Never true  Transportation Needs: No Transportation Needs (09/15/2023)   PRAPARE - Scientist, research (physical sciences) (Medical): No    Lack of Transportation (Non-Medical): No  Physical Activity: Not on file  Stress: Not on file  Social Connections: Patient Unable To Answer (09/15/2023)   Social Connection and Isolation Panel [NHANES]    Frequency of Communication with Friends and Family: Patient unable to answer    Frequency of Social Gatherings with Friends and Family: Patient unable to answer    Attends Religious Services: Patient unable to answer    Active Member of Clubs or Organizations: Patient unable to answer    Attends Banker Meetings: Patient unable to answer    Marital Status: Patient unable to answer  Intimate Partner Violence: Unknown (09/15/2023)   Humiliation, Afraid, Rape, and Kick questionnaire    Fear of Current or Ex-Partner: Patient unable to answer    Emotionally Abused: Patient unable to answer    Physically Abused: Not on file    Sexually Abused: Patient unable to answer    Family History  Problem Relation Age of Onset   Asperger's syndrome Mother      Vitals:   09/17/23 1030 09/17/23 1045 09/17/23 1100 09/17/23 1109  BP: 96/71 (!) 81/61 (!) 74/57   Pulse: 81 76 77   Resp: 19 18 17    Temp:      TempSrc:      SpO2: 95% 95% 95% 95%  Weight:      Height:        PHYSICAL EXAM General: Chronically ill-appearing elderly male, well nourished, in no acute distress. HEENT: Normocephalic and atraumatic. Neck: No JVD.   Lungs: Intubated.  Mechanical breath sounds bilaterally. Heart: HRRR. Normal S1 and S2 without gallops or murmurs.  Abdomen: Non-distended appearing.  Msk: Normal strength and tone for age. Extremities: Warm and well perfused. No clubbing, cyanosis. No edema.  Neuro: Alert and oriented X 3. Psych: Answers questions appropriately.   Labs: Basic Metabolic Panel: Recent Labs    09/16/23 0359 09/17/23 0523 09/17/23 1006  NA 140 142  142  K 4.4 5.3* 4.5  CL 111 110 110  CO2 20* 24 24  GLUCOSE 136* 147* 122*  BUN 18 32* 34*   CREATININE 0.88 1.14 1.19  CALCIUM  6.9* 8.1* 7.7*  MG 2.3 2.7*  --   PHOS 3.0 2.6  --    Liver Function Tests: Recent Labs    09/15/23 0308 09/16/23 0359  AST 76* 59*  ALT 26 26  ALKPHOS 40 34*  BILITOT 0.8 0.5  PROT 5.8* 5.5*  ALBUMIN 3.1* 2.8*   No results for input(s): "LIPASE", "AMYLASE" in the last 72 hours. CBC: Recent Labs    09/14/23 1835 09/15/23 0244 09/16/23 0359 09/17/23 0523  WBC 9.3   < > 13.3* 9.4  NEUTROABS 7.5  --   --  7.6  HGB 15.6   < > 12.8* 13.1  HCT 45.0   < > 39.2 39.4  MCV 91.8   < > 96.1 96.3  PLT 222   < > 163 170   < > = values in this interval not displayed.   Cardiac Enzymes: Recent Labs    09/14/23 1835 09/15/23 0128 09/15/23 0308 09/15/23 0812 09/15/23 1007 09/17/23 0523  CKTOTAL 136  --  612*  --   --  198  TROPONINIHS 106* 7,453*  --  15,306* 15,207*  --    BNP: Recent Labs    09/14/23 1835 09/17/23 0523  BNP 573.5* 968.1*   D-Dimer: No results for input(s): "DDIMER" in the last 72 hours. Hemoglobin A1C: No results for input(s): "HGBA1C" in the last 72 hours. Fasting Lipid Panel: No results for input(s): "CHOL", "HDL", "LDLCALC", "TRIG", "CHOLHDL", "LDLDIRECT" in the last 72 hours. Thyroid Function Tests: Recent Labs    09/15/23 0308  TSH 1.658   Anemia Panel: No results for input(s): "VITAMINB12", "FOLATE", "FERRITIN", "TIBC", "IRON", "RETICCTPCT" in the last 72 hours.   Radiology: DG Abd 1 View Result Date: 09/17/2023 CLINICAL DATA:  OG tube placement. EXAM: ABDOMEN - 1 VIEW COMPARISON:  09/14/2023 FINDINGS: OG tube tip is positioned in the mid stomach with proximal side port below the expected location of the GE junction. Nonspecific bowel gas pattern within the visualized abdomen. IMPRESSION: OG tube tip is positioned in the mid stomach. Electronically Signed   By: Donnal Fusi M.D.   On: 09/17/2023 06:42   DG Chest Port 1 View Result Date: 09/17/2023 CLINICAL DATA:  73 year old male intubated, central  line placement, bilateral airspace opacity. EXAM: PORTABLE CHEST 1 VIEW COMPARISON:  Portable chest 09/15/2023 and earlier. FINDINGS: Portable AP supine view at 0610 hours. Endotracheal tube projects over the midline trachea in good position between the clavicles and carina. Enteric tube has been removed. Stable right IJ approach central line. Stable left chest AICD. Similar lung volumes but diffuse bilateral increased pulmonary opacity with combined reticulonodular and vague/airspace appearance. No pneumothorax or pleural effusion. No air bronchograms. Stable cardiac size and mediastinal contours. No acute osseous abnormality identified. Paucity of bowel gas in the visible abdomen. IMPRESSION: 1. Satisfactory ET tube. Enteric tube removed. Stable right IJ approach central line. 2. Increased bilateral pulmonary opacity from two days ago, differential considerations include progressive pulmonary edema, bilateral infection, ARDS. No pleural effusion is evident. Electronically Signed   By: Marlise Simpers M.D.   On: 09/17/2023 06:36   ECHOCARDIOGRAM COMPLETE Result Date: 09/16/2023    ECHOCARDIOGRAM REPORT   Patient Name:   DR. Hannah Lewis [name] Date of Exam: 09/15/2023 Medical Rec #:  [id number]  Height:       66.0 in Accession #:    [id number]            Weight:       200.6 lb Date of Birth:  [birth year]             BSA:          2.002 m Patient Age:    73 years              BP:           106/68 mmHg Patient Gender: M                     HR:           77 bpm. Exam Location:  [facility] Procedure: 2D Echo, Cardiac Doppler and Color Doppler (Both Spectral and Color            Flow Doppler were utilized during procedure). Indications:     Elevated Troponin  History:         Patient has no prior history of Echocardiogram examinations.                  CAD, Stroke, Signs/Symptoms:Dyspnea; Risk Factors:Hypertension                  and Dyslipidemia.  Sonographer:     Brigid Canada RDCS Referring Phys:  [id number]  Delanna Fears Diagnosing Phys: Sabina Custovic IMPRESSIONS  1. Left ventricular ejection fraction, by estimation, is 35 to 40%. The left ventricle has moderately decreased function. The left ventricle demonstrates regional wall motion abnormalities (see scoring diagram/findings for description). Left ventricular  diastolic parameters are consistent with Grade I diastolic dysfunction (impaired relaxation).  2. Right ventricular systolic function is normal. The right ventricular size is normal. There is mildly elevated pulmonary artery systolic pressure. The estimated right ventricular systolic pressure is 42.7 mmHg.  3. The mitral valve is normal in structure. Moderate mitral valve regurgitation. No evidence of mitral stenosis.  4. The aortic valve is normal in structure. Aortic valve regurgitation is not visualized. No aortic stenosis is present.  5. The inferior vena cava is normal in size with greater than 50% respiratory variability, suggesting right atrial pressure of 3 mmHg. FINDINGS  Left Ventricle: Left ventricular ejection fraction, by estimation, is 35 to 40%. The left ventricle has moderately decreased function. The left ventricle demonstrates regional wall motion abnormalities. The left ventricular internal cavity size was normal in size. There is no left ventricular hypertrophy. Left ventricular diastolic parameters are consistent with Grade I diastolic dysfunction (impaired relaxation).  LV Wall Scoring: The antero-lateral wall and apical lateral segment are hypokinetic. Right Ventricle: The right ventricular size is normal. No increase in right ventricular wall thickness. Right ventricular systolic function is normal. There is mildly elevated pulmonary artery systolic pressure. The tricuspid regurgitant velocity is 2.63  m/s, and with an assumed right atrial pressure of 15 mmHg, the estimated right ventricular systolic pressure is 42.7 mmHg. Left Atrium: Left atrial size was normal in size. Right  Atrium: Right atrial size was normal in size. Pericardium: There is no evidence of pericardial effusion. Mitral Valve: The mitral valve is normal in structure. Moderate mitral valve regurgitation. No evidence of mitral valve stenosis. Tricuspid Valve: The tricuspid valve is normal in structure. Tricuspid valve regurgitation is mild. The aortic valve is normal in structure. Aortic valve regurgitation is not visualized. No aortic stenosis is present. Pulmonic Valve: The  pulmonic valve was normal in structure. Pulmonic valve regurgitation is not visualized. Aorta: The aortic root is normal in size and structure. Venous: The inferior vena cava is normal in size with greater than 50% respiratory variability, suggesting right atrial pressure of 3 mmHg. IAS/Shunts: No atrial level shunt detected by color flow Doppler.  LEFT VENTRICLE PLAX 2D LVIDd:         5.50 cm   Diastology LVIDs:         4.80 cm   LV e' medial:    6.71 cm/s LV PW:         1.00 cm   LV E/e' medial:  12.9 LV IVS:        1.00 cm   LV e' lateral:   12.83 cm/s LVOT diam:     2.30 cm   LV E/e' lateral: 6.8 LV SV:         63 LV SV Index:   32 LVOT Area:     4.15 cm  RIGHT VENTRICLE             IVC RV Basal diam:  3.70 cm     IVC diam: 2.30 cm RV S prime:     14.50 cm/s TAPSE (M-mode): 2.5 cm LEFT ATRIUM             Index        RIGHT ATRIUM           Index LA diam:        4.70 cm 2.35 cm/m   RA Area:     12.60 cm LA Vol (A2C):   61.3 ml 30.61 ml/m  RA Volume:   31.10 ml  15.53 ml/m LA Vol (A4C):   36.9 ml 18.43 ml/m LA Biplane Vol: 48.5 ml 24.22 ml/m  AORTIC VALVE LVOT Vmax:   82.47 cm/s LVOT Vmean:  54.333 cm/s LVOT VTI:    0.153 m AI PHT:      376 msec  AORTA Ao Root diam: 3.20 cm Ao Asc diam:  3.70 cm MITRAL VALVE               TRICUSPID VALVE MV Area (PHT): 5.14 cm    TR Peak grad:   27.7 mmHg MV Decel Time: 148 msec    TR Vmax:        263.00 cm/s MV E velocity: 86.75 cm/s MV A velocity: 55.30 cm/s  SHUNTS MV E/A ratio:  1.57        Systemic VTI:   0.15 m                            Systemic Diam: 2.30 cm Lanell Pinta Custovic Electronically signed by Isabell Manzanilla Signature Date/Time: 09/16/2023/11:03:59 AM    Final    DG Chest Port 1 View Result Date: 09/15/2023 EXAM: 1 VIEW XRAY OF THE CHEST 09/15/2023 10:35:00 AM COMPARISON: 1 view chest x-ray 05/16/2023 at 2:38 PM. CLINICAL HISTORY: Endotracheally intubated. FINDINGS: LUNGS AND PLEURA: Mild edema and bilateral effusions are similar to the prior study. Bibasilar airspace opacity likely reflects atelectasis. HEART AND MEDIASTINUM: The heart is enlarged. BONES AND SOFT TISSUES: No acute osseous abnormality. LINES AND TUBES: The endotracheal tube is stable, 3.5 cm above the carina. A right IJ line is stable. IMPRESSION: 1. Stable endotracheal tube and right IJ line. 2. Enlarged heart. 3. Mild edema and bilateral effusions, similar to the prior study. 4. Bibasilar airspace opacity, likely atelectasis. Electronically signed by:  Audree Leas MD 09/15/2023 01:26 PM EDT RP Workstation: [id number]   DG Chest Port 1 View Result Date: 09/15/2023 CLINICAL DATA:  Central line placement EXAM: PORTABLE CHEST 1 VIEW COMPARISON:  Radiograph and CT 09/14/2023 FINDINGS: Stable cardiomegaly. Pulmonary vascular congestion and bilateral ground-glass opacities. Small right pleural effusion and right basilar airspace opacities. No pneumothorax. Endotracheal tube tip in the intrathoracic trachea 3.0 cm from the carina. Subdiaphragmatic enteric tube. Right IJ CVC tip in the mid SVC. Left chest wall ICD. IMPRESSION: 1. Right IJ CVC tip in the mid SVC. No pneumothorax. 2. Similar findings of congestive heart failure. Electronically Signed   By: Rozell Cornet M.D.   On: 09/15/2023 02:52   CT Angio Chest PE W and/or Wo Contrast Result Date: 09/14/2023 CLINICAL DATA:  Cold symptoms, chest pain, short of breath, abdominal pain EXAM: CT ANGIOGRAPHY CHEST CT ABDOMEN AND PELVIS WITH CONTRAST TECHNIQUE: Multidetector CT imaging  of the chest was performed using the standard protocol during bolus administration of intravenous contrast. Multiplanar CT image reconstructions and MIPs were obtained to evaluate the vascular anatomy. Multidetector CT imaging of the abdomen and pelvis was performed using the standard protocol during bolus administration of intravenous contrast. RADIATION DOSE REDUCTION: This exam was performed according to the departmental dose-optimization program which includes automated exposure control, adjustment of the mA and/or kV according to patient size and/or use of iterative reconstruction technique. CONTRAST:  OMNIPAQUE  IOHEXOL  350 MG/ML SOLN COMPARISON:  01/02/2010, 08/23/2013, 09/14/2023 FINDINGS: CTA CHEST FINDINGS Cardiovascular: This is a technically adequate evaluation of the pulmonary vasculature. No filling defects or pulmonary emboli. Mild cardiomegaly with left ventricular dilatation. No pericardial effusion. Dual lead cardiac pacer, proximal lead in the right atrium and distal lead in the right ventricle. 4.1 cm ascending thoracic aortic aneurysm. No evidence of dissection. Atherosclerosis of the aorta and coronary vasculature. Mediastinum/Nodes: Endotracheal tube terminates just above carina. Enteric catheter extends into the gastric lumen. No pathologic adenopathy. Lungs/Pleura: There is dense bilateral perihilar airspace disease, greatest in the lower lobes. Trace bilateral pleural effusions. No pneumothorax. Central airways are patent. Musculoskeletal: No acute or destructive bony abnormalities. Reconstructed images demonstrate no additional findings. Review of the MIP images confirms the above findings. CT ABDOMEN and PELVIS FINDINGS Hepatobiliary: No focal liver abnormality is seen. No gallstones, gallbladder wall thickening, or biliary dilatation. Pancreas: Unremarkable. No pancreatic ductal dilatation or surrounding inflammatory changes. Spleen: Normal in size without focal abnormality.  Adrenals/Urinary Tract: Adrenal glands are unremarkable. Kidneys are normal, without renal calculi, focal lesion, or hydronephrosis. The bladder is decompressed with an indwelling Foley catheter. Stomach/Bowel: No bowel obstruction or ileus. Normal appendix right lower quadrant. Scattered colonic diverticulosis without evidence of acute diverticulitis. Enteric catheter tip within the lumen of the gastric body. Vascular/Lymphatic: Aortic atherosclerosis. No enlarged abdominal or pelvic lymph nodes. Reproductive: Stable enlargement of the prostate. Other: No free fluid or free intraperitoneal gas. No abdominal wall hernia. Musculoskeletal: No acute or destructive bony abnormalities. Postsurgical changes from L2-L4 posterior fusion. Reconstructed images demonstrate no additional findings. Review of the MIP images confirms the above findings. IMPRESSION: Chest: 1. No evidence of pulmonary embolus. 2. Dense bilateral perihilar airspace disease, greatest in the lower lobes, which could reflect widespread infection or edema. 3. Trace bilateral pleural effusions. 4. Aortic Atherosclerosis (ICD10-I70.0). Coronary artery atherosclerosis. 5. 4.1 cm ascending thoracic aortic aneurysm. Recommend annual imaging followup by CTA or MRA. This recommendation follows 2010 ACCF/AHA/AATS/ACR/ASA/SCA/SCAI/SIR/STS/SVM Guidelines for the Diagnosis and Management of Patients with Thoracic Aortic Disease. Circulation. 2010; 121:  [phone]. Aortic aneurysm NOS (ICD10-I71.9) Abdomen/pelvis: 1. No acute intra-abdominal or intrapelvic process. Normal appendix. 2. Distal colonic diverticulosis without diverticulitis. 3. Enlarged prostate. 4.  Aortic Atherosclerosis (ICD10-I70.0). Electronically Signed   By: Bobbye Burrow M.D.   On: 09/14/2023 22:40   CT ABDOMEN PELVIS W CONTRAST Result Date: 09/14/2023 CLINICAL DATA:  Cold symptoms, chest pain, short of breath, abdominal pain EXAM: CT ANGIOGRAPHY CHEST CT ABDOMEN AND PELVIS WITH CONTRAST  TECHNIQUE: Multidetector CT imaging of the chest was performed using the standard protocol during bolus administration of intravenous contrast. Multiplanar CT image reconstructions and MIPs were obtained to evaluate the vascular anatomy. Multidetector CT imaging of the abdomen and pelvis was performed using the standard protocol during bolus administration of intravenous contrast. RADIATION DOSE REDUCTION: This exam was performed according to the departmental dose-optimization program which includes automated exposure control, adjustment of the mA and/or kV according to patient size and/or use of iterative reconstruction technique. CONTRAST:  OMNIPAQUE  IOHEXOL  350 MG/ML SOLN COMPARISON:  01/02/2010, 08/23/2013, 09/14/2023 FINDINGS: CTA CHEST FINDINGS Cardiovascular: This is a technically adequate evaluation of the pulmonary vasculature. No filling defects or pulmonary emboli. Mild cardiomegaly with left ventricular dilatation. No pericardial effusion. Dual lead cardiac pacer, proximal lead in the right atrium and distal lead in the right ventricle. 4.1 cm ascending thoracic aortic aneurysm. No evidence of dissection. Atherosclerosis of the aorta and coronary vasculature. Mediastinum/Nodes: Endotracheal tube terminates just above carina. Enteric catheter extends into the gastric lumen. No pathologic adenopathy. Lungs/Pleura: There is dense bilateral perihilar airspace disease, greatest in the lower lobes. Trace bilateral pleural effusions. No pneumothorax. Central airways are patent. Musculoskeletal: No acute or destructive bony abnormalities. Reconstructed images demonstrate no additional findings. Review of the MIP images confirms the above findings. CT ABDOMEN and PELVIS FINDINGS Hepatobiliary: No focal liver abnormality is seen. No gallstones, gallbladder wall thickening, or biliary dilatation. Pancreas: Unremarkable. No pancreatic ductal dilatation or surrounding inflammatory changes. Spleen: Normal in  size without focal abnormality. Adrenals/Urinary Tract: Adrenal glands are unremarkable. Kidneys are normal, without renal calculi, focal lesion, or hydronephrosis. The bladder is decompressed with an indwelling Foley catheter. Stomach/Bowel: No bowel obstruction or ileus. Normal appendix right lower quadrant. Scattered colonic diverticulosis without evidence of acute diverticulitis. Enteric catheter tip within the lumen of the gastric body. Vascular/Lymphatic: Aortic atherosclerosis. No enlarged abdominal or pelvic lymph nodes. Reproductive: Stable enlargement of the prostate. Other: No free fluid or free intraperitoneal gas. No abdominal wall hernia. Musculoskeletal: No acute or destructive bony abnormalities. Postsurgical changes from L2-L4 posterior fusion. Reconstructed images demonstrate no additional findings. Review of the MIP images confirms the above findings. IMPRESSION: Chest: 1. No evidence of pulmonary embolus. 2. Dense bilateral perihilar airspace disease, greatest in the lower lobes, which could reflect widespread infection or edema. 3. Trace bilateral pleural effusions. 4. Aortic Atherosclerosis (ICD10-I70.0). Coronary artery atherosclerosis. 5. 4.1 cm ascending thoracic aortic aneurysm. Recommend annual imaging followup by CTA or MRA. This recommendation follows 2010 ACCF/AHA/AATS/ACR/ASA/SCA/SCAI/SIR/STS/SVM Guidelines for the Diagnosis and Management of Patients with Thoracic Aortic Disease. Circulation. 2010; 121: I696-E952. Aortic aneurysm NOS (ICD10-I71.9) Abdomen/pelvis: 1. No acute intra-abdominal or intrapelvic process. Normal appendix. 2. Distal colonic diverticulosis without diverticulitis. 3. Enlarged prostate. 4.  Aortic Atherosclerosis (ICD10-I70.0). Electronically Signed   By: Bobbye Burrow M.D.   On: 09/14/2023 22:40   CT HEAD WO CONTRAST ( ) Result Date: 09/14/2023 CLINICAL DATA:  Head trauma, minor (Age >= 65y) EXAM: CT HEAD WITHOUT CONTRAST TECHNIQUE: Contiguous axial images  were obtained from the base of  the skull through the vertex without intravenous contrast. RADIATION DOSE REDUCTION: This exam was performed according to the departmental dose-optimization program which includes automated exposure control, adjustment of the mA and/or kV according to patient size and/or use of iterative reconstruction technique. COMPARISON:  CT head 08/15/2022 FINDINGS: Brain: Bilateral anterior inferior frontal lobe and bilateral anterior temporal lobe encephalomalacia. Left cerebellar encephalomalacia. No evidence of large-territorial acute infarction. No parenchymal hemorrhage. No mass lesion. No extra-axial collection. No mass effect or midline shift. No hydrocephalus. Basilar cisterns are patent. Vascular: No hyperdense vessel. Atherosclerotic calcifications are present within the cavernous internal carotid and vertebral arteries. Skull: No acute fracture or focal lesion. Old left occipital skull fracture. Sinuses/Orbits: Paranasal sinuses and mastoid air cells are clear. Bilateral lens replacement. Bilateral scleral buckle. Otherwise the orbits are unremarkable. Other: Partially visualized endotracheal tube. IMPRESSION: No acute intracranial abnormality. Electronically Signed   By: Morgane  Naveau M.D.   On: 09/14/2023 22:36   DG Abd Portable 1 View Result Date: 09/14/2023 CLINICAL DATA:  Enteric catheter placement EXAM: PORTABLE ABDOMEN - 1 VIEW COMPARISON:  None Available. FINDINGS: Frontal view of the lower chest and upper abdomen demonstrates enteric catheter passing below diaphragm tip projecting over gastric body. Interstitial and ground-glass opacities throughout the lungs consistent with edema. Unremarkable bowel gas pattern. IMPRESSION: 1. Enteric catheter tip projects over gastric body. Electronically Signed   By: Bobbye Burrow M.D.   On: 09/14/2023 20:55   DG Chest Port 1 View Result Date: 09/14/2023 CLINICAL DATA:  Intubated, CHF, tachypnea EXAM: PORTABLE CHEST 1 VIEW  COMPARISON:  09/14/2023 FINDINGS: Single frontal view of the chest demonstrates endotracheal tube overlying tracheal air column, tip of proximally 2 cm above carina. Dual lead pacemaker/AICD unchanged. Cardiac silhouette remains mildly enlarged. Stable interstitial and ground-glass opacities throughout the lungs. No large effusion or pneumothorax. No acute bony abnormalities. IMPRESSION: 1. No complication after intubation. 2. Stable findings of congestive heart failure. Electronically Signed   By: Bobbye Burrow M.D.   On: 09/14/2023 20:55   DG Chest Port 1 View Result Date: 09/14/2023 CLINICAL DATA:  Tachypnea.  CHF EXAM: PORTABLE CHEST 1 VIEW COMPARISON:  X-ray 01/14/2022 and older FINDINGS: Left upper chest battery pack for defibrillator with leads along the right side of the heart. Stable cardiopericardial silhouette. Tortuous aorta. Increasing vascular congestion and component of possible edema. No pneumothorax or effusion. No consolidation. Degenerative changes along the spine. Overlapping cardiac leads. IMPRESSION: Increasing vascular congestion and interstitial changes, possible edema. Defibrillator. Electronically Signed   By: Adrianna Horde M.D.   On: 09/14/2023 18:20    ECHO as above  TELEMETRY reviewed by me 09/17/2023: Sinus tachycardia with frequent PVCs, rate 100s  EKG reviewed by me: Sinus tachycardia rate 144 bpm with ST elevation V2/V3. Repeat EKG ST changes resolved.   Data reviewed by me 09/17/2023: last 24h vitals tele labs imaging I/O critical care team notes, advanced heart failure notes.   Principal Problem:   Acute respiratory failure with hypoxia (HCC) Active Problems:   Sepsis (HCC)   Altered mental status   HFrEF (heart failure with reduced ejection fraction) (HCC)   Non-ST elevation MI (NSTEMI) (HCC)   Aspiration pneumonia of both lower lobes (HCC)    ASSESSMENT AND PLAN:  Blake [name] is a 73 y.o. male  with a past medical history of coronary artery disease  s/p stent to LAD, ischemic cardiomyopathy s/p ICD ([employer]), chronic HFrEF, OSA (on CPAP), hx CVA who presented to the ED on 09/14/2023  for dyspnea, chest pain and AMS. Patient recently seen at urgent care for nonproductive cough, chills/diaphoresis Troponins found to be elevated.  Cardiology was consulted for further evaluation  # NSTEMI # Acute hypoxic respiratory failure  # Acute metabolic encephalopathy  # Sepsis # Acute on chronic HFrEF # Ischemic cardiomyopathy s/p ICD Patient presented to ED with chest pain, dyspnea. Patient intubated for airway protection. BNP elevated at 570.  Troponins elevated and trending 100 > 7400 > 15300 > 15200. Sinus tachycardia rate 144 bpm with ST elevation V2/V3. Repeat EKG ST changes resolved.  Echo this admission with  rEF (35-40%), with anterolateral wall and apical lateral segment hypokinesis (unchanged from prior echo in 2020). Patient intubated and remains on Levophed. BNP elevated 670 > 960. CXR with significant pulmonary edema.  -Completed 48 hrs of heparin  gtt for medical management of NSTEMI. -Monitor and replenish electrolytes for a goal K >4, Mag >2. -Continue aspirin  81 mg, atorvastatin 40 mg per tube daily. -Started on IV lasix  80 mg twice daily. Closely monitor renal function and UOP.  -Consider resuming home GDMT once patient off pressors and hemodynamically stable. -Consider LHC when patient off pressors and hemodynamically stable. -Acute hypoxic respiratory failure, acute metabolic encephalopathy, sepsis management per primary. -Advanced heart failure consulted, appreciate recommendations.    This patient's plan of care was discussed and created with Dr. Parks Bollman and he is in agreement.  Signed: Creighton Doffing, PA-C  09/17/2023, 11:23 AM [facility]

## 2023-09-17 NOTE — Progress Notes (Signed)
 Cooling blanket applied for temp 102.9

## 2023-09-17 NOTE — Progress Notes (Signed)
 ANTICOAGULATION CONSULT NOTE  Pharmacy Consult for Heparin  Infusion Indication: ACS/STEMI  Patient Measurements: Height: 5\' 6"  (167.6 cm) Weight: 91.1 kg (200 lb 13.4 oz) IBW/kg (Calculated) : 63.8 HEPARIN  DW (KG): 82.2 Heparin  Dosing Weight: 84.2 kg  Labs: Recent Labs    09/14/23 1835 09/15/23 0128 09/15/23 0244 09/15/23 0308 09/15/23 0612 09/15/23 0812 09/15/23 1007 09/15/23 1226 09/15/23 1646 09/15/23 2058 09/16/23 0359 09/16/23 1405 09/16/23 2124 09/17/23 0523  HGB 15.6  --  13.8  --   --   --   --   --   --   --  12.8*  --   --  13.1  HCT 45.0  --  40.3  --   --   --   --   --   --   --  39.2  --   --  39.4  PLT 222  --  178  --  172  --   --   --   --   --  163  --   --  170  APTT  --   --  29  --   --   --   --   --   --   --   --   --   --   --   LABPROT  --   --  15.6*  --   --   --   --   --   --   --   --   --   --   --   INR  --   --  1.2  --   --   --   --   --   --   --   --   --   --   --   HEPARINUNFRC  --   --   --   --   --   --   --    < >  --    < > 0.22* 0.37 0.22* 0.25*  CREATININE 1.03  --   --  1.15 1.05  --   --   --  1.08  --  0.88  --   --   --   CKTOTAL 136  --   --  612*  --   --   --   --   --   --   --   --   --   --   TROPONINIHS 106* 7,453*  --   --   --  15,306* 15,207*  --   --   --   --   --   --   --    < > = values in this interval not displayed.    Estimated Creatinine Clearance: 76.6 mL/min (by C-G formula based on SCr of 0.88 mg/dL).   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   Coronary artery disease    Dementia (HCC)    Depression     Disorder of male genital organs 07/11/2013   Dyspnea    Encounter for long-term (current) use of medications 11/21/2014   Erectile dysfunction 07/11/2013  Executive function deficit 03/06/2016   GERD (gastroesophageal reflux disease)    Headache    stress   Hearing loss    Hyperlipidemia    Hypertension    Hypogonadism, 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   Major depression, recurrent, chronic (HCC) 03/06/2016   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   Mixed hyperlipidemia 02/02/2014   Myocardial infarction (HCC) 08/2013   OCD (obsessive compulsive disorder)    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  Goal of Therapy:  Heparin  level 0.3-0.7 units/ml Monitor platelets by anticoagulation protocol: Yes  Date Time HL Rate/Comment 5/28 2124 0.22 Subtherapeutic 5/29 0523 0.25 Subtherapeutic   Plan:  --Heparin  level is subtherapeutic --Bolus 1250 units then increase infusion to 1650 units/hr --Check HL in 8 hours --Daily CBC per protocol while on IV heparin   Coretta Dexter, PharmD, MBA 09/17/2023 6:00 AM

## 2023-09-17 NOTE — Progress Notes (Signed)
 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   Per ed reports, and patient's wife who is at the bedside, Patient was seen at the urgent care for chief complaints of chills, body aches, intermittently and nonproductive coughing fits. He was diagnosed with acute upper respiratory infection and was sent home with promethazine-dextromethorphan and cefdinir (OMNICEF). [name] report that he took the prescription and went to bed. Later he woke up altered and delusional and c/o CP and SOB. He was noted to be sweating profusely with abnormal body temp per wife.   ED Course: Initial vital signs showed HR >180 beats/minute, BP >160 mm Hg, the RR 30s-40s breaths/minute, and the oxygen saturation 80s% on NRB and a temperature of 103.1F (39.6C).    Pertinent Labs/Diagnostics Findings: Na+/ K+:138/3.9  Glucose:127 CO2 21 WBC: unremarkable Lactic acid: 1.5~2.6 CK: COVID PCR: Negative troponin:106~7453   BNP:573.5  VBG: pO2 pend; pCO2 37; pH 7.49;  HCO3 28.2, %O2 Sat 43.7.  CXR> CTH> CTA Chest> CT Abd/pelvis>see report   Patient's agitation and restlessness worsen with HR up in the 170s to 180s.  2 mg of Ativan was trialed but patient did not  calm down. Given worsening symptoms and high risk for decompensation, patient intubated for airway protection. He was started on broad-spectrum antibiotics Ampicillin, vanc, Ceftriaxone and Acyclovir due to unclear source for possible infection and cover broadly to include treatment for meningitis. PCCM consulted for admission.  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 and fell off, then he got back onto the horse and fell again on asphalt), HFrEF, ischemic cardiomyopathy, Hyperlipidemia, SVT, CAD, Depression, GERD, Hearing loss of both ears, MI, CVA, Hypertension, Hypotestosteronemia, ICD in place [facility] [employer] D433 DEFIBRILLATOR, RESONATE EL DR DF4 S/N: [id number] implanted 11/21/2021), and OSA on CPAP     Significant Hospital Events: Including procedures, antibiotic start and stop dates in addition to other pertinent events   5/26: Admitted to ICU with acute altered mental status and respiratory failure in the setting of suspected NMS versus sepsis requiring intubation 5/27: fever improved, continued on vasopressors 5/28: extubated in the afternoon. Respiratory failure overnight, re-intubated 5/29: vented, increased oxygen requirements. Family at the bedside. Heart failure consult placed   Interim History / Subjective:  Sedated and intubated, vented.  Objective    Blood pressure 105/69, pulse 91, temperature (!) 101.8 F (38.8 C), resp. rate 17, height 5\' 6"  (1.676 m), weight 91.1 kg, SpO2 97%. CVP:  [4 mmHg-21 mmHg] 17 mmHg  Vent Mode: PRVC FiO2 (%):  [70 %-100 %] 70 % Set Rate:  [15 bmp] 15 bmp Vt Set:  [450 mL-500 mL] 500 mL PEEP:  [12 cmH20-15 cmH20] 12 cmH20  Plateau Pressure:  [23 cmH20] 23 cmH20   Intake/Output Summary (Last 24 hours) at 09/17/2023 1436 Last data filed at 09/17/2023 1400 Gross per 24 hour  Intake  1639.19 ml  Output 2346 ml  Net -706.81 ml   Filed Weights   09/15/23 0352 09/16/23 0342 09/17/23 0500  Weight: 91 kg 91.1 kg 91.1 kg    Examination:  Physical Exam Constitutional:      General: He is not in acute distress.    Appearance: He is ill-appearing.  Cardiovascular:     Rate and Rhythm: Normal rate and regular rhythm.     Pulses: Normal pulses.     Heart sounds: Normal heart sounds.  Pulmonary:     Breath sounds: No wheezing or rales.     Comments: Ventilated breath sounds Neurological:     Mental Status: He is disoriented.     Assessment and Plan   74 year old male presenting with a few days' history of shortness of breath, viral symptoms, and encephalopathy. He was intubated on presentation to the ED due to agitation for airway protection and admitted to the ICU for further management.   #Acute Hypoxic Respiratory Failure #Toxic Metabolic Encephalopathy  #Fronto-temporal Dementia #Rhinovirus Infection #Aspiration Pneumonia  #Community Acquired Pneumonia #Acute on Chronic HFrEF #Type II NSTEMI  #CAD s/p PCI to LAD #Circulatory Shock  #Severe Sepsis #OSA on CPAP   Neuro - Propofol  and fentanyl  for analgosedation, RASS -1, holding home duloxetine , restarted valproic acid . Report of promethazine-dextromethorphan but wife reports only minimal ingestion (1/4 spoon once). Symptoms unlikely to represent NMS or meningitis. He is on clopidogrel  and the risk of LP outweighs the benefits. Daily WUA, plan.   CV - history of HFrEF, on GDMT, and CAD with PCI to LAD. Presents with significant elevation in troponin, which represents NSTEMI secondary to shock and critical illness. This could also be secondary to decompensated heart failure (elevated BNP, interlobular septal thickening). Onn nor-epinephrine  for vasopressor support, with goal MAP of > 65 mmHg, with hypotension secondary to sedation. Lactic acid is within normal. Appreciate input form cardiology and heart failure  team. Have initiated diuresis today. Plan for [facility] after stabilization of shock and resolution of respiratory failure.  Pulm - baseline OSA followed by [facility], on CPAP. Now intubated with respiratory failure secondary to rhinovirus with superimposed bacterial pneumonia (consolidations noted in the bilateral lower lobes). Also noted interlobular septal thickening on my review of the chest CT suggestive of concomitant decompensated heart failure. Extubated yesterday but failed overnight and required to be re-intubated. Will continue to wean vent settings, diurese, treat pneumonia, and work towards liberation off mechanical ventilation.  GI - SUP with PPI, tube feeds to be re-initiated.   Renal - kidney function at baseline and improved. Avoiding nephrotoxins. On IV diuresis   Endo - ICU glycemic protocol, on hydrocortisone for adjunct treatment of severe pneumonia.   Hem/Onc - was on heparin  gtt for ACS, discontinued given it's been 48 hours   ID - positive for rhinovirus, has bilateral consolidations in the lower lobes consistent with CAP. Continue with broad spectrum antibiotics, d/c acyclovir and ampicillin given low index of suspicion for meningitis.  Best Practice (right click and "Reselect all SmartList Selections" daily)   Diet/type: tubefeeds DVT prophylaxis systemic heparin  Pressure ulcer(s): N/A GI prophylaxis: PPI Lines: Central line and Arterial Line Foley:  Yes, and it is still needed Code Status:  full code Last date of multidisciplinary goals of care discussion [09/17/2023]  Labs  CBC: Recent Labs  Lab 09/14/23 1835 09/15/23 0244 09/15/23 0612 09/16/23 0359 09/17/23 0523  WBC 9.3 6.6  --  13.3* 9.4  NEUTROABS 7.5  --   --   --  7.6  HGB 15.6 13.8  --  12.8* 13.1  HCT 45.0 40.3  --  39.2 39.4  MCV 91.8 92.9  --  96.1 96.3  PLT 222 178 172 163 170    Basic Metabolic Panel: Recent Labs  Lab 09/14/23 1835 09/15/23 0308 09/15/23 0612 09/15/23 1646  09/16/23 0359 09/17/23 0523 09/17/23 1006  NA 138   < > 136 138 140 142 142  K 3.9   < > 2.8* 5.0 4.4 5.3* 4.5  CL 105   < > 107 109 111 110 110  CO2 21*   < > 20* 22 20* 24 24  GLUCOSE 127*   < > 145* 147* 136* 147* 122*  BUN 15   < > 17 17 18  32* 34*  CREATININE 1.03   < > 1.05 1.08 0.88 1.14 1.19  CALCIUM  8.9   < > 7.7* 7.7* 6.9* 8.1* 7.7*  MG 1.8  --  2.2  --  2.3 2.7*  --   PHOS  --   --  2.4*  --  3.0 2.6  --    < > = values in this interval not displayed.   GFR: Estimated Creatinine Clearance: 56.7 mL/min (by C-G formula based on SCr of 1.19 mg/dL). Recent Labs  Lab 09/14/23 1835 09/14/23 2120 09/15/23 0128 09/15/23 0244 09/15/23 0614 09/16/23 0359 09/17/23 0523  PROCALCITON  --   --   --   --  3.43  --   --   WBC 9.3  --   --  6.6  --  13.3* 9.4  LATICACIDVEN 1.5 2.6* 1.4  --   --   --   --     Liver Function Tests: Recent Labs  Lab 09/14/23 1835 09/15/23 0308 09/16/23 0359  AST 25 76* 59*  ALT 24 26 26   ALKPHOS 55 40 34*  BILITOT 0.9 0.8 0.5  PROT 7.2 5.8* 5.5*  ALBUMIN 4.0 3.1* 2.8*   No results for input(s): "LIPASE", "AMYLASE" in the last 168 hours. No results for input(s): "AMMONIA" in the last 168 hours.  ABG    Component Value Date/Time   PHART 7.39 09/17/2023 0834   PCO2ART 41 09/17/2023 0834   PO2ART 100 09/17/2023 0834   HCO3 24.8 09/17/2023 0834   ACIDBASEDEF 0.2 09/17/2023 0834   O2SAT 73.3 09/17/2023 0856     Coagulation Profile: Recent Labs  Lab 09/15/23 0244  INR 1.2    Cardiac Enzymes: Recent Labs  Lab 09/14/23 1835 09/15/23 0308 09/17/23 0523  CKTOTAL 136 612* 198    HbA1C: No results found for: "HGBA1C"  CBG: Recent Labs  Lab 09/16/23 1937 09/16/23 2333 09/17/23 0323 09/17/23 0735 09/17/23 1122  GLUCAP 143* 149* 137* 108* 118*    Review of Systems:   N/A  Past Medical History:  He,  has a past medical history of 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), Asperger's syndrome, 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), CAD (coronary artery disease) (09/21/2013), 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), Chronic left shoulder pain (04/03/2016), 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), Cognitive deficit as late effect of traumatic brain injury (HCC) (03/06/2016), Coronary artery disease, Dementia (HCC), Depression, Disorder of male genital organs (07/11/2013), Dyspnea, Encounter for long-term (current) use of medications (11/21/2014), Erectile dysfunction (07/11/2013), Executive function deficit (03/06/2016), GERD (gastroesophageal reflux disease),  Headache, Hearing loss, Hyperlipidemia, Hypertension, Hypogonadism, male (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   Surgical History:   Past Surgical History:  Procedure Laterality Date   BACK SURGERY  2011   rods and screws   CLOSED REDUCTION NASAL FRACTURE Bilateral 06/11/2018   Procedure: CLOSED REDUCTION NASAL FRACTURE;  Surgeon: [name], MD;  Location: [facility];  Service: ENT;  Laterality: Bilateral;   COLONOSCOPY  2015   CORONARY ANGIOPLASTY     2015 stent   CORONARY STENT INTERVENTION N/A 06/08/2019   Procedure: CORONARY STENT INTERVENTION;  Surgeon: Antonette Batters, MD;  Location: [facility];  Service: Cardiovascular;  Laterality: N/A;   CYSTOSCOPY  1984   EYE SURGERY Bilateral 2005,2006,2009   detached retina, cataract   FOOT ARTHRODESIS Left 05/30/2015   Procedure: ARTHRODESIS FOOT STJ LT FOOT;  Surgeon: Anell Baptist, DPM;   Location: [facility];  Service: Podiatry;  Laterality: Left;  WITH POPLITEAL BLOCK   FOOT SURGERY Left    HERNIA REPAIR Right 1969   inguinal   LEFT HEART CATH AND CORONARY ANGIOGRAPHY Left 06/08/2019   Procedure: LEFT HEART CATH AND CORONARY ANGIOGRAPHY;  Surgeon: Antonette Batters, MD;  Location: [facility];  Service: Cardiovascular;  Laterality: Left;   SEPTOPLASTY Bilateral 06/11/2018   Procedure: SEPTOPLASTY;  Surgeon: [name], MD;  Location: [facility];  Service: ENT;  Laterality: Bilateral;   SHOULDER SURGERY Right 2004   skull surgery     TOE SURGERY Right 2009   TONSILLECTOMY  2008     Social History:   reports that he has never smoked. He has never used smokeless tobacco. He reports that he does not drink alcohol and does not use drugs.   Family History:  His family history includes Asperger's syndrome in his mother.   Allergies Allergies  Allergen Reactions   Promethazine Other (See Comments)    Severe neuroleptic malignant syndrome requiring intubation   Mirtazapine Other (See Comments)    Other reaction(s): Other (See Comments) Irritability/anger, increased appetite, worsening depression Irritability/anger, increased appetite, worsening depression      Home Medications  Prior to Admission medications   Medication Sig Start Date End Date Taking? Authorizing Provider  acetaminophen  (TYLENOL ) 500 MG tablet Take 1,000 mg by mouth every 6 (six) hours as needed for mild pain or moderate pain.   Yes [provider]  aspirin  EC 81 MG tablet Take 81 mg by mouth daily.   Yes [provider]  B Complex-C (SUPER B COMPLEX PO) Take 1 tablet by mouth daily.   Yes [provider]  Calcium  Carbonate-Vitamin D  600-200 MG-UNIT CAPS Take 1 tablet by mouth 2 (two) times daily.    Yes [provider]  carvedilol  (COREG ) 3.125 MG tablet Take 3.125 mg by mouth daily.  11/03/18  Yes [provider]  cefdinir (OMNICEF) 300  MG capsule Take 300 mg by mouth 2 (two) times daily. 09/14/23 09/21/23 Yes [provider]  clindamycin  (CLEOCIN  T) 1 % lotion Apply 1 application topically 2 (two) times daily as needed.    Yes [provider]  clopidogrel  (PLAVIX ) 75 MG tablet Take 75 mg by mouth daily. 09/27/18  Yes [provider]  divalproex  (DEPAKOTE  ER) 250 MG 24 hr tablet Total of 750 mg daily. Take along with 500 mg tab 08/24/23  Yes Hisada, Ivan Marion, MD  divalproex  (DEPAKOTE  ER) 500 MG 24 hr tablet Take 1 tablet (500 mg total) by mouth daily. 08/13/23 10/12/23 Yes Todd Fossa, MD  DULoxetine  (CYMBALTA ) 60 MG capsule Take 60 mg by mouth 2 (two) times daily.  11/17/18  Yes [provider]  FIBER PO Take 1 tablet by mouth 2 (two) times daily.    Yes [provider]  furosemide  (LASIX ) 20 MG tablet Take 20 mg by mouth daily. 11/03/18  Yes [provider]  latanoprost (XALATAN) 0.005 % ophthalmic solution Place 1 drop into the left eye daily. 11/06/21  Yes [provider]  losartan (COZAAR) 100 MG tablet Take 100 mg by mouth daily.   Yes [provider]  Melatonin 5 MG TABS Take 10 mg by mouth at bedtime. 30 min before bed   Yes [provider]  omeprazole (PRILOSEC) 20 MG capsule Take 20 mg by mouth daily.   Yes [provider]  Probiotic Product (PROBIOTIC DAILY PO) Take 1 tablet by mouth daily. Senior   Yes [provider]  silodosin  (RAPAFLO ) 8 MG CAPS capsule TAKE 1 CAPSULE BY MOUTH DAILY WITH BREAKFAST Patient taking differently: Take 8 mg by mouth daily. 03/16/23  Yes Stoioff, Kizzie Perks, MD  sodium chloride  (OCEAN) 0.65 % SOLN nasal spray Place 2 sprays into both nostrils as needed for congestion.   Yes [provider]  tadalafil  (CIALIS ) 5 MG tablet Take 1 tablet by mouth once daily 08/03/23  Yes Stoioff, Scott C, MD  SYRINGE-NEEDLE, DISP, 3 ML (B-D 3CC LUER-LOK SYR 22GX1-1/2) 22G X 1-1/2" 3 ML MISC USE AS DIRECTED WITH  TESTOSTERONE  08/24/23   Stoioff, Kizzie Perks, MD  testosterone  cypionate (DEPOTESTOSTERONE CYPIONATE) 200 MG/ML injection Inject 0.3 mLs (60 mg total) into the muscle once a week. DISCARD REMAINING AS THESE ARE SINGLE USE VIALS. Patient taking differently: Inject 60 mg into the muscle every 14 (fourteen) days. EVERY OTHER SATURDAY. DISCARD REMAINING AS THESE ARE SINGLE USE VIALS. 06/16/23   Geraline Knapp, MD     Critical care time: 25    Vergia Glasgow, MD [facility] 09/17/2023 2:39 PM

## 2023-09-17 NOTE — Progress Notes (Signed)
 Heart Failure Navigator Progress Note  Assessed for Heart & Vascular TOC clinic readiness.  Does not meet criteria due to current [facility] with Advanced Heart Failure Team consult.  Navigator will sign off at this time.  Celedonio Coil, RN, BSN [facility] Heart Failure Navigator Secure Chat Only

## 2023-09-18 ENCOUNTER — Inpatient Hospital Stay

## 2023-09-18 DIAGNOSIS — J690 Pneumonitis due to inhalation of food and vomit: Secondary | ICD-10-CM | POA: Diagnosis not present

## 2023-09-18 DIAGNOSIS — G928 Other toxic encephalopathy: Secondary | ICD-10-CM | POA: Diagnosis not present

## 2023-09-18 DIAGNOSIS — I5023 Acute on chronic systolic (congestive) heart failure: Secondary | ICD-10-CM | POA: Diagnosis not present

## 2023-09-18 DIAGNOSIS — J9601 Acute respiratory failure with hypoxia: Secondary | ICD-10-CM | POA: Diagnosis not present

## 2023-09-18 LAB — BLOOD GAS, ARTERIAL
Acid-Base Excess: 1.9 mmol/L (ref 0.0–2.0)
Bicarbonate: 28.2 mmol/L — ABNORMAL HIGH (ref 20.0–28.0)
FIO2: 70 %
MECHVT: 500 mL
O2 Saturation: 100 %
PEEP: 12 cmH2O
Patient temperature: 37
RATE: 15 {breaths}/min
pCO2 arterial: 50 mmHg — ABNORMAL HIGH (ref 32–48)
pH, Arterial: 7.36 (ref 7.35–7.45)
pO2, Arterial: 166 mmHg — ABNORMAL HIGH (ref 83–108)

## 2023-09-18 LAB — BASIC METABOLIC PANEL WITH GFR
Anion gap: 12 (ref 5–15)
Anion gap: 10 (ref 5–15)
BUN: 38 mg/dL — ABNORMAL HIGH (ref 8–23)
BUN: 43 mg/dL — ABNORMAL HIGH (ref 8–23)
CO2: 25 mmol/L (ref 22–32)
CO2: 29 mmol/L (ref 22–32)
Calcium: 7.6 mg/dL — ABNORMAL LOW (ref 8.9–10.3)
Calcium: 7.2 mg/dL — ABNORMAL LOW (ref 8.9–10.3)
Chloride: 106 mmol/L (ref 98–111)
Chloride: 105 mmol/L (ref 98–111)
Creatinine, Ser: 1.09 mg/dL (ref 0.61–1.24)
Creatinine, Ser: 1.03 mg/dL (ref 0.61–1.24)
GFR, Estimated: >60 mL/min (ref >60)
GFR, Estimated: >60 mL/min (ref >60)
Glucose, Bld: 145 mg/dL — ABNORMAL HIGH (ref 70–99)
Glucose, Bld: 160 mg/dL — ABNORMAL HIGH (ref 70–99)
Potassium: 4.2 mmol/L (ref 3.5–5.1)
Potassium: 3.9 mmol/L (ref 3.5–5.1)
Sodium: 143 mmol/L (ref 135–145)
Sodium: 144 mmol/L (ref 135–145)

## 2023-09-18 LAB — CBC WITH DIFFERENTIAL/PLATELET
Abs Immature Granulocytes: 0.09 10*3/uL — ABNORMAL HIGH (ref 0.00–0.07)
Basophils Absolute: 0 10*3/uL (ref 0.0–0.1)
Basophils Relative: 0 %
Eosinophils Absolute: 0 10*3/uL (ref 0.0–0.5)
Eosinophils Relative: 0 %
HCT: 36.9 % — ABNORMAL LOW (ref 39.0–52.0)
Hemoglobin: 12.5 g/dL — ABNORMAL LOW (ref 13.0–17.0)
Immature Granulocytes: 1 %
Lymphocytes Relative: 6 %
Lymphs Abs: 0.6 10*3/uL — ABNORMAL LOW (ref 0.7–4.0)
MCH: 32.2 pg (ref 26.0–34.0)
MCHC: 33.9 g/dL (ref 30.0–36.0)
MCV: 95.1 fL (ref 80.0–100.0)
Monocytes Absolute: 1 10*3/uL (ref 0.1–1.0)
Monocytes Relative: 11 %
Neutro Abs: 7.6 10*3/uL (ref 1.7–7.7)
Neutrophils Relative %: 82 %
Platelets: 173 10*3/uL (ref 150–400)
RBC: 3.88 MIL/uL — ABNORMAL LOW (ref 4.22–5.81)
RDW: 14.7 % (ref 11.5–15.5)
WBC: 9.3 10*3/uL (ref 4.0–10.5)
nRBC: 0 % (ref 0.0–0.2)

## 2023-09-18 LAB — TROPONIN I (HIGH SENSITIVITY)
Troponin I (High Sensitivity): 1714 ng/L (ref <18)
Troponin I (High Sensitivity): 1595 ng/L (ref <18)

## 2023-09-18 LAB — TRIGLYCERIDES: Triglycerides: 146 mg/dL (ref <150)

## 2023-09-18 LAB — GLUCOSE, CAPILLARY
Glucose-Capillary: 124 mg/dL — ABNORMAL HIGH (ref 70–99)
Glucose-Capillary: 125 mg/dL — ABNORMAL HIGH (ref 70–99)
Glucose-Capillary: 109 mg/dL — ABNORMAL HIGH (ref 70–99)
Glucose-Capillary: 168 mg/dL — ABNORMAL HIGH (ref 70–99)
Glucose-Capillary: 146 mg/dL — ABNORMAL HIGH (ref 70–99)

## 2023-09-18 LAB — COOXEMETRY PANEL
Carboxyhemoglobin: 1.4 % (ref 0.5–1.5)
Carboxyhemoglobin: 1.6 % — ABNORMAL HIGH (ref 0.5–1.5)
Methemoglobin: 1.1 % (ref 0.0–1.5)
Methemoglobin: 1.1 % (ref 0.0–1.5)
O2 Saturation: 99.7 %
O2 Saturation: 91.4 %
Total hemoglobin: 12.9 g/dL (ref 12.0–16.0)
Total hemoglobin: 13 g/dL (ref 12.0–16.0)
Total oxygen content: 97.2 %
Total oxygen content: 88.9 %

## 2023-09-18 LAB — MAGNESIUM
Magnesium: 2.6 mg/dL — ABNORMAL HIGH (ref 1.7–2.4)
Magnesium: 2.3 mg/dL (ref 1.7–2.4)

## 2023-09-18 LAB — PROCALCITONIN: Procalcitonin: 2.99 ng/mL

## 2023-09-18 LAB — PHOSPHORUS: Phosphorus: 3.4 mg/dL (ref 2.5–4.6)

## 2023-09-18 MED ORDER — AMIODARONE IV BOLUS ONLY 150 MG/100ML
INTRAVENOUS | Status: AC
  Administered 2023-09-18: 150 mg via INTRAVENOUS
  Filled 2023-09-18: qty 100

## 2023-09-18 MED ORDER — SODIUM CHLORIDE 0.9% FLUSH
10.0000 mL | Freq: Two times a day (BID) | INTRAVENOUS | Status: DC
  Administered 2023-09-18 – 2023-09-24 (×12): 10 mL
  Administered 2023-09-25: 30 mL
  Administered 2023-09-25 – 2023-09-26 (×2): 10 mL
  Administered 2023-09-26: 30 mL
  Administered 2023-09-27 – 2023-09-29 (×5): 10 mL

## 2023-09-18 MED ORDER — AMIODARONE HCL IN DEXTROSE 360-4.14 MG/200ML-% IV SOLN: 60.0000 mg/h | INTRAVENOUS | Status: AC

## 2023-09-18 MED ORDER — AMIODARONE IV BOLUS ONLY 150 MG/100ML: 150.0000 mg | Freq: Once | INTRAVENOUS | Status: AC

## 2023-09-18 MED ORDER — AMIODARONE HCL IN DEXTROSE 360-4.14 MG/200ML-% IV SOLN
INTRAVENOUS | Status: AC
  Administered 2023-09-18: 60 mg/h via INTRAVENOUS
  Filled 2023-09-18: qty 200

## 2023-09-18 MED ORDER — AMIODARONE HCL IN DEXTROSE 360-4.14 MG/200ML-% IV SOLN
30.0000 mg/h | INTRAVENOUS | Status: DC
  Administered 2023-09-18 – 2023-09-20 (×5): 30 mg/h via INTRAVENOUS
  Filled 2023-09-18 (×5): qty 200

## 2023-09-18 MED ORDER — FUROSEMIDE 10 MG/ML IJ SOLN
80.0000 mg | Freq: Two times a day (BID) | INTRAMUSCULAR | Status: AC
  Administered 2023-09-18 (×2): 80 mg via INTRAVENOUS
  Filled 2023-09-18 (×2): qty 8

## 2023-09-18 MED ORDER — ENOXAPARIN SODIUM 40 MG/0.4ML IJ SOSY
40.0000 mg | PREFILLED_SYRINGE | INTRAMUSCULAR | Status: DC
  Administered 2023-09-18 – 2023-09-21 (×4): 40 mg via SUBCUTANEOUS
  Filled 2023-09-18 (×4): qty 0.4

## 2023-09-18 MED ORDER — SODIUM CHLORIDE 0.9 % IV SOLN
2.0000 g | INTRAVENOUS | Status: DC
  Administered 2023-09-19: 2 g via INTRAVENOUS
  Filled 2023-09-18: qty 20

## 2023-09-18 MED ORDER — SODIUM CHLORIDE 0.9% FLUSH: 10.0000 mL | INTRAVENOUS | Status: DC | PRN

## 2023-09-18 MED ORDER — MAGNESIUM SULFATE IN D5W 1-5 GM/100ML-% IV SOLN
1.0000 g | Freq: Once | INTRAVENOUS | Status: AC
  Administered 2023-09-18: 1 g via INTRAVENOUS
  Filled 2023-09-18: qty 100

## 2023-09-18 MED ORDER — POTASSIUM CHLORIDE 10 MEQ/50ML IV SOLN
10.0000 meq | INTRAVENOUS | Status: AC
  Administered 2023-09-18 (×2): 10 meq via INTRAVENOUS
  Filled 2023-09-18 (×2): qty 50

## 2023-09-18 NOTE — Plan of Care (Signed)
  Problem: Clinical Measurements: Goal: Respiratory complications will improve Outcome: Progressing   Problem: Education: Goal: Knowledge of General Education information will improve Description: Including pain rating scale, medication(s)/side effects and non-pharmacologic comfort measures Outcome: Not Progressing   Problem: Health Behavior/Discharge Planning: Goal: Ability to manage health-related needs will improve Outcome: Not Progressing   Problem: Clinical Measurements: Goal: Ability to maintain clinical measurements within normal limits will improve Outcome: Not Progressing

## 2023-09-18 NOTE — Progress Notes (Signed)
 During WUA, pt dislodged OGT. Tube feeds held. OGT reinserted and marked at lip at 65. KUB ordered. Per Lina Render, NP, safe to use. Tube feeds restarted.

## 2023-09-18 NOTE — Progress Notes (Signed)
 Pt went into Vtach with a pulse. Lina Render, NP called to bedside. Pads placed on patient. 150 mg amiodarone bolus administered. Amiodarone gtt started.

## 2023-09-18 NOTE — Plan of Care (Signed)
  Problem: Nutrition: Goal: Adequate nutrition will be maintained Outcome: Progressing   Problem: Elimination: Goal: Will not experience complications related to urinary retention Outcome: Progressing   Problem: Education: Goal: Knowledge of General Education information will improve Description: Including pain rating scale, medication(s)/side effects and non-pharmacologic comfort measures Outcome: Not Progressing   Problem: Health Behavior/Discharge Planning: Goal: Ability to manage health-related needs will improve Outcome: Not Progressing   Problem: Clinical Measurements: Goal: Ability to maintain clinical measurements within normal limits will improve Outcome: Not Progressing   Problem: Activity: Goal: Risk for activity intolerance will decrease Outcome: Not Progressing

## 2023-09-18 NOTE — Progress Notes (Signed)
 [facility] CLINIC CARDIOLOGY PROGRESS NOTE       Patient ID: Blake [name] MRN: [id number] DOB/AGE: 12-09-1950 73 y.o.  Admit date: 09/14/2023 Referring Physician Dr. Vergia Glasgow Primary Physician Rory Collard, MD Primary Cardiologist Dr. Beau Bound Reason for Consultation Elevated trops, NSTEMI  HPI: Blake [name] is a 73 y.o. male  with a past medical history of coronary artery disease s/p stent to LAD, ischemic cardiomyopathy s/p ICD ([employer]), chronic HFrEF, OSA (on CPAP), hx CVA who presented to the ED on 09/14/2023 for dyspnea, chest pain and AMS. Patient recently seen at urgent care for nonproductive cough, chills/diaphoresis Troponins found to be elevated.  Cardiology was consulted for further evaluation.   Interval History: -Patient seen and examined this AM. Patient intubated and remains on levo gtt with stable BP and HR.  -Overnight Tele showed no significant events.  -Total UOP 4.55L yesterday with stable renal function. - K 4.2  -Consulted Advanced heart failure.    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   Coronary artery disease    Dementia (HCC)    Depression    Disorder of male genital organs 07/11/2013   Dyspnea    Encounter for long-term (current) use of medications 11/21/2014   Erectile dysfunction 07/11/2013   Executive function deficit 03/06/2016   GERD (gastroesophageal reflux disease)    Headache    stress   Hearing loss    Hyperlipidemia    Hypertension    Hypogonadism, 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   Major depression, recurrent, chronic (HCC) 03/06/2016   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   Mixed hyperlipidemia 02/02/2014   Myocardial infarction (HCC) 08/2013   OCD (obsessive compulsive disorder)    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   Other specified disorder of male genital organs(608.89) 07/11/2013   Prostatic hypertrophy    Pseudophakia of both eyes 04/20/2015   Raynaud's disease    Reduced libido 07/11/2013   Repeated falls    weak left ankle   Scoliosis    Stroke (HCC) 2/16   "light"   Synovitis    knees, ankles    Past Surgical History:  Procedure Laterality Date   BACK SURGERY  2011   rods and screws   CLOSED REDUCTION NASAL FRACTURE Bilateral 06/11/2018   Procedure: CLOSED REDUCTION NASAL FRACTURE;  Surgeon: [name], MD;  Location: [facility] ORS;  Service: ENT;  Laterality: Bilateral;   COLONOSCOPY  2015   CORONARY ANGIOPLASTY     2015 stent   CORONARY STENT INTERVENTION N/A 06/08/2019   Procedure: CORONARY STENT INTERVENTION;  Surgeon: Antonette Batters, MD;  Location: [facility] INVASIVE CV LAB;  Service: Cardiovascular;  Laterality: N/A;   CYSTOSCOPY  1984   EYE SURGERY Bilateral 2005,2006,2009   detached retina, cataract   FOOT ARTHRODESIS Left 05/30/2015   Procedure: ARTHRODESIS FOOT STJ LT FOOT;  Surgeon: Anell Baptist, DPM;  Location: [facility];  Service:  Podiatry;  Laterality: Left;  WITH POPLITEAL BLOCK   FOOT SURGERY Left    HERNIA REPAIR Right 1969   inguinal   LEFT HEART CATH AND CORONARY ANGIOGRAPHY Left 06/08/2019   Procedure: LEFT HEART CATH AND CORONARY ANGIOGRAPHY;  Surgeon: Antonette Batters, MD;  Location: [facility] INVASIVE CV LAB;  Service: Cardiovascular;  Laterality: Left;   SEPTOPLASTY Bilateral 06/11/2018   Procedure: SEPTOPLASTY;  Surgeon: [name], MD;  Location: [facility] ORS;  Service: ENT;  Laterality: Bilateral;    SHOULDER SURGERY Right 2004   skull surgery     TOE SURGERY Right 2009   TONSILLECTOMY  2008    Medications Prior to Admission  Medication Sig Dispense Refill Last Dose/Taking   acetaminophen  (TYLENOL ) 500 MG tablet Take 1,000 mg by mouth every 6 (six) hours as needed for mild pain or moderate pain.   Unknown   aspirin  EC 81 MG tablet Take 81 mg by mouth daily.   09/14/2023 at  6:00 AM   B Complex-C (SUPER B COMPLEX PO) Take 1 tablet by mouth daily.   09/14/2023 Morning   Calcium  Carbonate-Vitamin D  600-200 MG-UNIT CAPS Take 1 tablet by mouth 2 (two) times daily.    09/14/2023 Morning   carvedilol  (COREG ) 3.125 MG tablet Take 3.125 mg by mouth daily.    09/14/2023 Morning   cefdinir (OMNICEF) 300 MG capsule Take 300 mg by mouth 2 (two) times daily.   09/14/2023 Noon   clindamycin  (CLEOCIN  T) 1 % lotion Apply 1 application topically 2 (two) times daily as needed.    Unknown   clopidogrel  (PLAVIX ) 75 MG tablet Take 75 mg by mouth daily.   09/14/2023 at  6:00 AM   divalproex  (DEPAKOTE  ER) 250 MG 24 hr tablet Total of 750 mg daily. Take along with 500 mg tab 30 tablet 0 09/14/2023 at  8:00 AM   divalproex  (DEPAKOTE  ER) 500 MG 24 hr tablet Take 1 tablet (500 mg total) by mouth daily. 30 tablet 1 09/14/2023 at  8:00 AM   DULoxetine  (CYMBALTA ) 60 MG capsule Take 60 mg by mouth 2 (two) times daily.    09/14/2023 at  3:30 AM   FIBER PO Take 1 tablet by mouth 2 (two) times daily.    09/14/2023 Morning   furosemide  (LASIX ) 20 MG tablet Take 20 mg by mouth daily.   09/14/2023 Morning   latanoprost (XALATAN) 0.005 % ophthalmic solution Place 1 drop into the left eye daily.   09/14/2023 Morning   losartan (COZAAR) 100 MG tablet Take 100 mg by mouth daily.   09/14/2023 Morning   Melatonin 5 MG TABS Take 10 mg by mouth at bedtime. 30 min before bed   09/13/2023 Bedtime   omeprazole (PRILOSEC) 20 MG capsule Take 20 mg by mouth daily.   09/14/2023 Morning   Probiotic Product (PROBIOTIC DAILY PO) Take 1 tablet by mouth daily.  Senior   09/14/2023 Morning   silodosin  (RAPAFLO ) 8 MG CAPS capsule TAKE 1 CAPSULE BY MOUTH DAILY WITH BREAKFAST (Patient taking differently: Take 8 mg by mouth daily.) 90 capsule 3 09/13/2023 at  6:00 PM   sodium chloride  (OCEAN) 0.65 % SOLN nasal spray Place 2 sprays into both nostrils as needed for congestion.   09/14/2023 Morning   tadalafil  (CIALIS ) 5 MG tablet Take 1 tablet by mouth once daily 90 tablet 0 09/14/2023 Morning   SYRINGE-NEEDLE, DISP, 3 ML (B-D 3CC LUER-LOK SYR 22GX1-1/2) 22G X 1-1/2" 3 ML MISC USE AS DIRECTED WITH TESTOSTERONE  12 each 20  testosterone  cypionate (DEPOTESTOSTERONE CYPIONATE) 200 MG/ML injection Inject 0.3 mLs (60 mg total) into the muscle once a week. DISCARD REMAINING AS THESE ARE SINGLE USE VIALS. (Patient taking differently: Inject 60 mg into the muscle every 14 (fourteen) days. EVERY OTHER SATURDAY. DISCARD REMAINING AS THESE ARE SINGLE USE VIALS.) 5 mL 0    Social History   Socioeconomic History   Marital status: Married    Spouse name: cindy   Number of children: 1   Years of education: Not on file   Highest education level: Professional school degree (e.g., MD, DDS, DVM, JD)  Occupational History   Not on file  Tobacco Use   Smoking status: Never   Smokeless tobacco: Never  Vaping Use   Vaping status: Never Used  Substance and Sexual Activity   Alcohol use: No   Drug use: No   Sexual activity: Not Currently    Birth control/protection: None  Other Topics Concern   Not on file  Social History Narrative   Not on file   Social Drivers of Health   Financial Resource Strain: Not on file  Food Insecurity: No Food Insecurity (09/15/2023)   Hunger Vital Sign    Worried About Running Out of Food in the Last Year: Never true    Ran Out of Food in the Last Year: Never true  Transportation Needs: No Transportation Needs (09/15/2023)   PRAPARE - Administrator, Civil Service (Medical): No    Lack of Transportation (Non-Medical): No   Physical Activity: Not on file  Stress: Not on file  Social Connections: Patient Unable To Answer (09/15/2023)   Social Connection and Isolation Panel [NHANES]    Frequency of Communication with Friends and Family: Patient unable to answer    Frequency of Social Gatherings with Friends and Family: Patient unable to answer    Attends Religious Services: Patient unable to answer    Active Member of Clubs or Organizations: Patient unable to answer    Attends Banker Meetings: Patient unable to answer    Marital Status: Patient unable to answer  Intimate Partner Violence: Unknown (09/15/2023)   Humiliation, Afraid, Rape, and Kick questionnaire    Fear of Current or Ex-Partner: Patient unable to answer    Emotionally Abused: Patient unable to answer    Physically Abused: Not on file    Sexually Abused: Patient unable to answer    Family History  Problem Relation Age of Onset   Asperger's syndrome Mother      Vitals:   09/18/23 0730 09/18/23 0751 09/18/23 0800 09/18/23 0804  BP: 107/75  103/69   Pulse: 70  71   Resp: 15  15   Temp:  97.9 F (36.6 C) 97.9 F (36.6 C)   TempSrc:  Bladder Bladder   SpO2: 97% 97% 97% 96%  Weight:      Height:        PHYSICAL EXAM General: Chronically ill-appearing elderly male, well nourished, in no acute distress. HEENT: Normocephalic and atraumatic. Neck: No JVD.   Lungs: Intubated.  Mechanical breath sounds bilaterally. Heart: HRRR. Normal S1 and S2 without gallops or murmurs.  Abdomen: Non-distended appearing.  Msk: Normal strength and tone for age. Extremities: Warm and well perfused. No clubbing, cyanosis. No edema.  Neuro: Alert and oriented X 3. Psych: Answers questions appropriately.   Labs: Basic Metabolic Panel: Recent Labs    09/17/23 0523 09/17/23 1006 09/17/23 2058 09/18/23 0430  NA 142 142  --  143  K 5.3* 4.5 3.5 4.2  CL 110 110  --  106  CO2 24 24  --  25  GLUCOSE 147* 122*  --  145*  BUN 32* 34*  --   38*  CREATININE 1.14 1.19  --  1.09  CALCIUM  8.1* 7.7*  --  7.6*  MG 2.7*  --  2.3 2.6*  PHOS 2.6  --   --  3.4   Liver Function Tests: Recent Labs    09/16/23 0359  AST 59*  ALT 26  ALKPHOS 34*  BILITOT 0.5  PROT 5.5*  ALBUMIN 2.8*   No results for input(s): "LIPASE", "AMYLASE" in the last 72 hours. CBC: Recent Labs    09/17/23 0523 09/18/23 0430  WBC 9.4 9.3  NEUTROABS 7.6 7.6  HGB 13.1 12.5*  HCT 39.4 36.9*  MCV 96.3 95.1  PLT 170 173   Cardiac Enzymes: Recent Labs    09/15/23 1007 09/17/23 0523  CKTOTAL  --  198  TROPONINIHS 15,207*  --    BNP: Recent Labs    09/17/23 0523  BNP 968.1*   D-Dimer: No results for input(s): "DDIMER" in the last 72 hours. Hemoglobin A1C: No results for input(s): "HGBA1C" in the last 72 hours. Fasting Lipid Panel: Recent Labs    09/18/23 0430  TRIG 146   Thyroid Function Tests: No results for input(s): "TSH", "T4TOTAL", "T3FREE", "THYROIDAB" in the last 72 hours.  Invalid input(s): "FREET3"  Anemia Panel: No results for input(s): "VITAMINB12", "FOLATE", "FERRITIN", "TIBC", "IRON", "RETICCTPCT" in the last 72 hours.   Radiology: DG Abd 1 View Result Date: 09/17/2023 CLINICAL DATA:  OG tube placement. EXAM: ABDOMEN - 1 VIEW COMPARISON:  09/14/2023 FINDINGS: OG tube tip is positioned in the mid stomach with proximal side port below the expected location of the GE junction. Nonspecific bowel gas pattern within the visualized abdomen. IMPRESSION: OG tube tip is positioned in the mid stomach. Electronically Signed   By: Donnal Fusi M.D.   On: 09/17/2023 06:42   DG Chest Port 1 View Result Date: 09/17/2023 CLINICAL DATA:  73 year old male intubated, central line placement, bilateral airspace opacity. EXAM: PORTABLE CHEST 1 VIEW COMPARISON:  Portable chest 09/15/2023 and earlier. FINDINGS: Portable AP supine view at 0610 hours. Endotracheal tube projects over the midline trachea in good position between the clavicles and  carina. Enteric tube has been removed. Stable right IJ approach central line. Stable left chest AICD. Similar lung volumes but diffuse bilateral increased pulmonary opacity with combined reticulonodular and vague/airspace appearance. No pneumothorax or pleural effusion. No air bronchograms. Stable cardiac size and mediastinal contours. No acute osseous abnormality identified. Paucity of bowel gas in the visible abdomen. IMPRESSION: 1. Satisfactory ET tube. Enteric tube removed. Stable right IJ approach central line. 2. Increased bilateral pulmonary opacity from two days ago, differential considerations include progressive pulmonary edema, bilateral infection, ARDS. No pleural effusion is evident. Electronically Signed   By: Marlise Simpers M.D.   On: 09/17/2023 06:36   ECHOCARDIOGRAM COMPLETE Result Date: 09/16/2023    ECHOCARDIOGRAM REPORT   Patient Name:   DR. Hannah Lewis [name] Date of Exam: 09/15/2023 Medical Rec #:  [id number]             Height:       66.0 in Accession #:    [id number]            Weight:       200.6 lb Date of Birth:  1950/10/15  BSA:          2.002 m Patient Age:    73 years              BP:           106/68 mmHg Patient Gender: M                     HR:           77 bpm. Exam Location:  [facility] Procedure: 2D Echo, Cardiac Doppler and Color Doppler (Both Spectral and Color            Flow Doppler were utilized during procedure). Indications:     Elevated Troponin  History:         Patient has no prior history of Echocardiogram examinations.                  CAD, Stroke, Signs/Symptoms:Dyspnea; Risk Factors:Hypertension                  and Dyslipidemia.  Sonographer:     Brigid Canada RDCS Referring Phys:  [id number] Delanna Fears Diagnosing Phys: Sabina Custovic IMPRESSIONS  1. Left ventricular ejection fraction, by estimation, is 35 to 40%. The left ventricle has moderately decreased function. The left ventricle demonstrates regional wall motion abnormalities (see scoring  diagram/findings for description). Left ventricular  diastolic parameters are consistent with Grade I diastolic dysfunction (impaired relaxation).  2. Right ventricular systolic function is normal. The right ventricular size is normal. There is mildly elevated pulmonary artery systolic pressure. The estimated right ventricular systolic pressure is 42.7 mmHg.  3. The mitral valve is normal in structure. Moderate mitral valve regurgitation. No evidence of mitral stenosis.  4. The aortic valve is normal in structure. Aortic valve regurgitation is not visualized. No aortic stenosis is present.  5. The inferior vena cava is normal in size with greater than 50% respiratory variability, suggesting right atrial pressure of 3 mmHg. FINDINGS  Left Ventricle: Left ventricular ejection fraction, by estimation, is 35 to 40%. The left ventricle has moderately decreased function. The left ventricle demonstrates regional wall motion abnormalities. The left ventricular internal cavity size was normal in size. There is no left ventricular hypertrophy. Left ventricular diastolic parameters are consistent with Grade I diastolic dysfunction (impaired relaxation).  LV Wall Scoring: The antero-lateral wall and apical lateral segment are hypokinetic. Right Ventricle: The right ventricular size is normal. No increase in right ventricular wall thickness. Right ventricular systolic function is normal. There is mildly elevated pulmonary artery systolic pressure. The tricuspid regurgitant velocity is 2.63  m/s, and with an assumed right atrial pressure of 15 mmHg, the estimated right ventricular systolic pressure is 42.7 mmHg. Left Atrium: Left atrial size was normal in size. Right Atrium: Right atrial size was normal in size. Pericardium: There is no evidence of pericardial effusion. Mitral Valve: The mitral valve is normal in structure. Moderate mitral valve regurgitation. No evidence of mitral valve stenosis. Tricuspid Valve: The tricuspid  valve is normal in structure. Tricuspid valve regurgitation is mild. The aortic valve is normal in structure. Aortic valve regurgitation is not visualized. No aortic stenosis is present. Pulmonic Valve: The pulmonic valve was normal in structure. Pulmonic valve regurgitation is not visualized. Aorta: The aortic root is normal in size and structure. Venous: The inferior vena cava is normal in size with greater than 50% respiratory variability, suggesting right atrial pressure of 3 mmHg. IAS/Shunts: No atrial level shunt detected by color  flow Doppler.  LEFT VENTRICLE PLAX 2D LVIDd:         5.50 cm   Diastology LVIDs:         4.80 cm   LV e' medial:    6.71 cm/s LV PW:         1.00 cm   LV E/e' medial:  12.9 LV IVS:        1.00 cm   LV e' lateral:   12.83 cm/s LVOT diam:     2.30 cm   LV E/e' lateral: 6.8 LV SV:         63 LV SV Index:   32 LVOT Area:     4.15 cm  RIGHT VENTRICLE             IVC RV Basal diam:  3.70 cm     IVC diam: 2.30 cm RV S prime:     14.50 cm/s TAPSE (M-mode): 2.5 cm LEFT ATRIUM             Index        RIGHT ATRIUM           Index LA diam:        4.70 cm 2.35 cm/m   RA Area:     12.60 cm LA Vol (A2C):   61.3 ml 30.61 ml/m  RA Volume:   31.10 ml  15.53 ml/m LA Vol (A4C):   36.9 ml 18.43 ml/m LA Biplane Vol: 48.5 ml 24.22 ml/m  AORTIC VALVE LVOT Vmax:   82.47 cm/s LVOT Vmean:  54.333 cm/s LVOT VTI:    0.153 m AI PHT:      376 msec  AORTA Ao Root diam: 3.20 cm Ao Asc diam:  3.70 cm MITRAL VALVE               TRICUSPID VALVE MV Area (PHT): 5.14 cm    TR Peak grad:   27.7 mmHg MV Decel Time: 148 msec    TR Vmax:        263.00 cm/s MV E velocity: 86.75 cm/s MV A velocity: 55.30 cm/s  SHUNTS MV E/A ratio:  1.57        Systemic VTI:  0.15 m                            Systemic Diam: 2.30 cm Lanell Pinta Custovic Electronically signed by Isabell Manzanilla Signature Date/Time: 09/16/2023/11:03:59 AM    Final    DG Chest Port 1 View Result Date: 09/15/2023 EXAM: 1 VIEW XRAY OF THE CHEST 09/15/2023  10:35:00 AM COMPARISON: 1 view chest x-ray 05/16/2023 at 2:38 PM. CLINICAL HISTORY: Endotracheally intubated. FINDINGS: LUNGS AND PLEURA: Mild edema and bilateral effusions are similar to the prior study. Bibasilar airspace opacity likely reflects atelectasis. HEART AND MEDIASTINUM: The heart is enlarged. BONES AND SOFT TISSUES: No acute osseous abnormality. LINES AND TUBES: The endotracheal tube is stable, 3.5 cm above the carina. A right IJ line is stable. IMPRESSION: 1. Stable endotracheal tube and right IJ line. 2. Enlarged heart. 3. Mild edema and bilateral effusions, similar to the prior study. 4. Bibasilar airspace opacity, likely atelectasis. Electronically signed by: Audree Leas MD 09/15/2023 01:26 PM EDT RP Workstation: [id number]   DG Chest Port 1 View Result Date: 09/15/2023 CLINICAL DATA:  Central line placement EXAM: PORTABLE CHEST 1 VIEW COMPARISON:  Radiograph and CT 09/14/2023 FINDINGS: Stable cardiomegaly. Pulmonary vascular congestion and bilateral ground-glass opacities. Small right pleural effusion and  right basilar airspace opacities. No pneumothorax. Endotracheal tube tip in the intrathoracic trachea 3.0 cm from the carina. Subdiaphragmatic enteric tube. Right IJ CVC tip in the mid SVC. Left chest wall ICD. IMPRESSION: 1. Right IJ CVC tip in the mid SVC. No pneumothorax. 2. Similar findings of congestive heart failure. Electronically Signed   By: Rozell Cornet M.D.   On: 09/15/2023 02:52   CT Angio Chest PE W and/or Wo Contrast Result Date: 09/14/2023 CLINICAL DATA:  Cold symptoms, chest pain, short of breath, abdominal pain EXAM: CT ANGIOGRAPHY CHEST CT ABDOMEN AND PELVIS WITH CONTRAST TECHNIQUE: Multidetector CT imaging of the chest was performed using the standard protocol during bolus administration of intravenous contrast. Multiplanar CT image reconstructions and MIPs were obtained to evaluate the vascular anatomy. Multidetector CT imaging of the abdomen and pelvis was  performed using the standard protocol during bolus administration of intravenous contrast. RADIATION DOSE REDUCTION: This exam was performed according to the departmental dose-optimization program which includes automated exposure control, adjustment of the mA and/or kV according to patient size and/or use of iterative reconstruction technique. CONTRAST:  OMNIPAQUE  IOHEXOL  350 MG/ML SOLN COMPARISON:  01/02/2010, 08/23/2013, 09/14/2023 FINDINGS: CTA CHEST FINDINGS Cardiovascular: This is a technically adequate evaluation of the pulmonary vasculature. No filling defects or pulmonary emboli. Mild cardiomegaly with left ventricular dilatation. No pericardial effusion. Dual lead cardiac pacer, proximal lead in the right atrium and distal lead in the right ventricle. 4.1 cm ascending thoracic aortic aneurysm. No evidence of dissection. Atherosclerosis of the aorta and coronary vasculature. Mediastinum/Nodes: Endotracheal tube terminates just above carina. Enteric catheter extends into the gastric lumen. No pathologic adenopathy. Lungs/Pleura: There is dense bilateral perihilar airspace disease, greatest in the lower lobes. Trace bilateral pleural effusions. No pneumothorax. Central airways are patent. Musculoskeletal: No acute or destructive bony abnormalities. Reconstructed images demonstrate no additional findings. Review of the MIP images confirms the above findings. CT ABDOMEN and PELVIS FINDINGS Hepatobiliary: No focal liver abnormality is seen. No gallstones, gallbladder wall thickening, or biliary dilatation. Pancreas: Unremarkable. No pancreatic ductal dilatation or surrounding inflammatory changes. Spleen: Normal in size without focal abnormality. Adrenals/Urinary Tract: Adrenal glands are unremarkable. Kidneys are normal, without renal calculi, focal lesion, or hydronephrosis. The bladder is decompressed with an indwelling Foley catheter. Stomach/Bowel: No bowel obstruction or ileus. Normal appendix right  lower quadrant. Scattered colonic diverticulosis without evidence of acute diverticulitis. Enteric catheter tip within the lumen of the gastric body. Vascular/Lymphatic: Aortic atherosclerosis. No enlarged abdominal or pelvic lymph nodes. Reproductive: Stable enlargement of the prostate. Other: No free fluid or free intraperitoneal gas. No abdominal wall hernia. Musculoskeletal: No acute or destructive bony abnormalities. Postsurgical changes from L2-L4 posterior fusion. Reconstructed images demonstrate no additional findings. Review of the MIP images confirms the above findings. IMPRESSION: Chest: 1. No evidence of pulmonary embolus. 2. Dense bilateral perihilar airspace disease, greatest in the lower lobes, which could reflect widespread infection or edema. 3. Trace bilateral pleural effusions. 4. Aortic Atherosclerosis (ICD10-I70.0). Coronary artery atherosclerosis. 5. 4.1 cm ascending thoracic aortic aneurysm. Recommend annual imaging followup by CTA or MRA. This recommendation follows 2010 ACCF/AHA/AATS/ACR/ASA/SCA/SCAI/SIR/STS/SVM Guidelines for the Diagnosis and Management of Patients with Thoracic Aortic Disease. Circulation. 2010; 121: W098-J191. Aortic aneurysm NOS (ICD10-I71.9) Abdomen/pelvis: 1. No acute intra-abdominal or intrapelvic process. Normal appendix. 2. Distal colonic diverticulosis without diverticulitis. 3. Enlarged prostate. 4.  Aortic Atherosclerosis (ICD10-I70.0). Electronically Signed   By: Bobbye Burrow M.D.   On: 09/14/2023 22:40   CT ABDOMEN PELVIS W CONTRAST Result Date:  09/14/2023 CLINICAL DATA:  Cold symptoms, chest pain, short of breath, abdominal pain EXAM: CT ANGIOGRAPHY CHEST CT ABDOMEN AND PELVIS WITH CONTRAST TECHNIQUE: Multidetector CT imaging of the chest was performed using the standard protocol during bolus administration of intravenous contrast. Multiplanar CT image reconstructions and MIPs were obtained to evaluate the vascular anatomy. Multidetector CT imaging of  the abdomen and pelvis was performed using the standard protocol during bolus administration of intravenous contrast. RADIATION DOSE REDUCTION: This exam was performed according to the departmental dose-optimization program which includes automated exposure control, adjustment of the mA and/or kV according to patient size and/or use of iterative reconstruction technique. CONTRAST:  OMNIPAQUE  IOHEXOL  350 MG/ML SOLN COMPARISON:  01/02/2010, 08/23/2013, 09/14/2023 FINDINGS: CTA CHEST FINDINGS Cardiovascular: This is a technically adequate evaluation of the pulmonary vasculature. No filling defects or pulmonary emboli. Mild cardiomegaly with left ventricular dilatation. No pericardial effusion. Dual lead cardiac pacer, proximal lead in the right atrium and distal lead in the right ventricle. 4.1 cm ascending thoracic aortic aneurysm. No evidence of dissection. Atherosclerosis of the aorta and coronary vasculature. Mediastinum/Nodes: Endotracheal tube terminates just above carina. Enteric catheter extends into the gastric lumen. No pathologic adenopathy. Lungs/Pleura: There is dense bilateral perihilar airspace disease, greatest in the lower lobes. Trace bilateral pleural effusions. No pneumothorax. Central airways are patent. Musculoskeletal: No acute or destructive bony abnormalities. Reconstructed images demonstrate no additional findings. Review of the MIP images confirms the above findings. CT ABDOMEN and PELVIS FINDINGS Hepatobiliary: No focal liver abnormality is seen. No gallstones, gallbladder wall thickening, or biliary dilatation. Pancreas: Unremarkable. No pancreatic ductal dilatation or surrounding inflammatory changes. Spleen: Normal in size without focal abnormality. Adrenals/Urinary Tract: Adrenal glands are unremarkable. Kidneys are normal, without renal calculi, focal lesion, or hydronephrosis. The bladder is decompressed with an indwelling Foley catheter. Stomach/Bowel: No bowel obstruction or  ileus. Normal appendix right lower quadrant. Scattered colonic diverticulosis without evidence of acute diverticulitis. Enteric catheter tip within the lumen of the gastric body. Vascular/Lymphatic: Aortic atherosclerosis. No enlarged abdominal or pelvic lymph nodes. Reproductive: Stable enlargement of the prostate. Other: No free fluid or free intraperitoneal gas. No abdominal wall hernia. Musculoskeletal: No acute or destructive bony abnormalities. Postsurgical changes from L2-L4 posterior fusion. Reconstructed images demonstrate no additional findings. Review of the MIP images confirms the above findings. IMPRESSION: Chest: 1. No evidence of pulmonary embolus. 2. Dense bilateral perihilar airspace disease, greatest in the lower lobes, which could reflect widespread infection or edema. 3. Trace bilateral pleural effusions. 4. Aortic Atherosclerosis (ICD10-I70.0). Coronary artery atherosclerosis. 5. 4.1 cm ascending thoracic aortic aneurysm. Recommend annual imaging followup by CTA or MRA. This recommendation follows 2010 ACCF/AHA/AATS/ACR/ASA/SCA/SCAI/SIR/STS/SVM Guidelines for the Diagnosis and Management of Patients with Thoracic Aortic Disease. Circulation. 2010; 121: B147-W295. Aortic aneurysm NOS (ICD10-I71.9) Abdomen/pelvis: 1. No acute intra-abdominal or intrapelvic process. Normal appendix. 2. Distal colonic diverticulosis without diverticulitis. 3. Enlarged prostate. 4.  Aortic Atherosclerosis (ICD10-I70.0). Electronically Signed   By: Bobbye Burrow M.D.   On: 09/14/2023 22:40   CT HEAD WO CONTRAST ( ) Result Date: 09/14/2023 CLINICAL DATA:  Head trauma, minor (Age >= 65y) EXAM: CT HEAD WITHOUT CONTRAST TECHNIQUE: Contiguous axial images were obtained from the base of the skull through the vertex without intravenous contrast. RADIATION DOSE REDUCTION: This exam was performed according to the departmental dose-optimization program which includes automated exposure control, adjustment of the mA and/or  kV according to patient size and/or use of iterative reconstruction technique. COMPARISON:  CT head 08/15/2022 FINDINGS: Brain: Bilateral anterior inferior  frontal lobe and bilateral anterior temporal lobe encephalomalacia. Left cerebellar encephalomalacia. No evidence of large-territorial acute infarction. No parenchymal hemorrhage. No mass lesion. No extra-axial collection. No mass effect or midline shift. No hydrocephalus. Basilar cisterns are patent. Vascular: No hyperdense vessel. Atherosclerotic calcifications are present within the cavernous internal carotid and vertebral arteries. Skull: No acute fracture or focal lesion. Old left occipital skull fracture. Sinuses/Orbits: Paranasal sinuses and mastoid air cells are clear. Bilateral lens replacement. Bilateral scleral buckle. Otherwise the orbits are unremarkable. Other: Partially visualized endotracheal tube. IMPRESSION: No acute intracranial abnormality. Electronically Signed   By: Morgane  Naveau M.D.   On: 09/14/2023 22:36   DG Abd Portable 1 View Result Date: 09/14/2023 CLINICAL DATA:  Enteric catheter placement EXAM: PORTABLE ABDOMEN - 1 VIEW COMPARISON:  None Available. FINDINGS: Frontal view of the lower chest and upper abdomen demonstrates enteric catheter passing below diaphragm tip projecting over gastric body. Interstitial and ground-glass opacities throughout the lungs consistent with edema. Unremarkable bowel gas pattern. IMPRESSION: 1. Enteric catheter tip projects over gastric body. Electronically Signed   By: Bobbye Burrow M.D.   On: 09/14/2023 20:55   DG Chest Port 1 View Result Date: 09/14/2023 CLINICAL DATA:  Intubated, CHF, tachypnea EXAM: PORTABLE CHEST 1 VIEW COMPARISON:  09/14/2023 FINDINGS: Single frontal view of the chest demonstrates endotracheal tube overlying tracheal air column, tip of proximally 2 cm above carina. Dual lead pacemaker/AICD unchanged. Cardiac silhouette remains mildly enlarged. Stable interstitial and  ground-glass opacities throughout the lungs. No large effusion or pneumothorax. No acute bony abnormalities. IMPRESSION: 1. No complication after intubation. 2. Stable findings of congestive heart failure. Electronically Signed   By: Bobbye Burrow M.D.   On: 09/14/2023 20:55   DG Chest Port 1 View Result Date: 09/14/2023 CLINICAL DATA:  Tachypnea.  CHF EXAM: PORTABLE CHEST 1 VIEW COMPARISON:  X-ray 01/14/2022 and older FINDINGS: Left upper chest battery pack for defibrillator with leads along the right side of the heart. Stable cardiopericardial silhouette. Tortuous aorta. Increasing vascular congestion and component of possible edema. No pneumothorax or effusion. No consolidation. Degenerative changes along the spine. Overlapping cardiac leads. IMPRESSION: Increasing vascular congestion and interstitial changes, possible edema. Defibrillator. Electronically Signed   By: Adrianna Horde M.D.   On: 09/14/2023 18:20    ECHO as above  TELEMETRY reviewed by me 09/18/2023: Sinus rhythm, rate 70s  EKG reviewed by me: Sinus tachycardia rate 144 bpm with ST elevation V2/V3. Repeat EKG ST changes resolved.   Data reviewed by me 09/18/2023: last 24h vitals tele labs imaging I/O critical care team notes, advanced heart failure notes.   Principal Problem:   Acute respiratory failure with hypoxia (HCC) Active Problems:   Sepsis (HCC)   Altered mental status   HFrEF (heart failure with reduced ejection fraction) (HCC)   Non-ST elevation MI (NSTEMI) (HCC)   Aspiration pneumonia of both lower lobes (HCC)    ASSESSMENT AND PLAN:  Blake [name] is a 73 y.o. male  with a past medical history of coronary artery disease s/p stent to LAD, ischemic cardiomyopathy s/p ICD ([employer]), chronic HFrEF, OSA (on CPAP), hx CVA who presented to the ED on 09/14/2023 for dyspnea, chest pain and AMS. Patient recently seen at urgent care for nonproductive cough, chills/diaphoresis Troponins found to be elevated.   Cardiology was consulted for further evaluation  # NSTEMI # Acute hypoxic respiratory failure  # Acute metabolic encephalopathy  # Sepsis # Acute on chronic HFrEF # Ischemic cardiomyopathy s/p ICD Patient  presented to ED with chest pain, dyspnea. Patient intubated for airway protection. BNP elevated at 570.  Troponins elevated and trending 100 > 7400 > 15300 > 15200. Sinus tachycardia rate 144 bpm with ST elevation V2/V3. Repeat EKG ST changes resolved.  Echo this admission with  rEF (35-40%), with anterolateral wall and apical lateral segment hypokinesis (unchanged from prior echo in 2020). Patient intubated and remains on Levophed. BNP elevated 670 > 960. CXR with significant pulmonary edema.  -Completed 48 hrs of heparin  gtt for medical management of NSTEMI. -Monitor and replenish electrolytes for a goal K >4, Mag >2. -Continue aspirin  81 mg, atorvastatin 40 mg per tube daily. -Continue on IV lasix  80 mg twice daily. Closely monitor renal function and UOP.  -Consider resuming home GDMT once patient off pressors and hemodynamically stable. -Consider LHC when patient off pressors and hemodynamically stable. -Acute hypoxic respiratory failure, acute metabolic encephalopathy, sepsis management per primary. -Advanced heart failure consulted, appreciate recommendations.    This patient's plan of care was discussed and created with Dr. Parks Bollman and he is in agreement.  Signed: Creighton Doffing, PA-C  09/18/2023, 8:25 AM [facility]

## 2023-09-18 NOTE — Consult Note (Signed)
 PHARMACY CONSULT NOTE - ELECTROLYTES  Pharmacy Consult for Electrolyte Monitoring and Replacement   Recent Labs: Potassium (mmol/L)  Date Value  09/18/2023 4.2  05/23/2014 4.1   Magnesium  (mg/dL)  Date Value  16/01/9603 2.6 (H)  09/13/2013 1.9   Calcium  (mg/dL)  Date Value  54/12/8117 7.6 (L)   Calcium , Total (mg/dL)  Date Value  14/78/2956 8.5   Albumin (g/dL)  Date Value  21/30/8657 2.8 (L)  05/23/2014 3.7   Phosphorus (mg/dL)  Date Value  84/69/6295 3.4   Sodium (mmol/L)  Date Value  09/18/2023 143  05/23/2014 143   Height: 5' 5.98" (167.6 cm) Weight: 86.7 kg (191 lb 2.2 oz) IBW/kg (Calculated) : 63.76 Estimated Creatinine Clearance: 60.5 mL/min (by C-G formula based on SCr of 1.09 mg/dL).  Assessment  Blake [name] is a 73 y.o. male presenting with acute respiratory failure and shock. 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 Pharmacy has been consulted to monitor and replace electrolytes.  Diet: NPO, intubated MIVF: N/A Pertinent medications: Lasix  80 mg IV BID  Goal of Therapy: Electrolytes within normal limits  Plan:  Electrolytes stable despite diuretics. No replacement warranted Labs tomorrow  Thank you for allowing pharmacy to be a part of this patient's care.  Blake [name] 09/18/2023 7:58 AM

## 2023-09-18 NOTE — Progress Notes (Signed)
 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   Per ed reports, and patient's wife who is at the bedside, Patient was seen at the urgent care for chief complaints of chills, body aches, intermittently and nonproductive coughing fits. He was diagnosed with acute upper respiratory infection and was sent home with promethazine-dextromethorphan and cefdinir (OMNICEF). [name] report that he took the prescription and went to bed. Later he woke up altered and delusional and c/o CP and SOB. He was noted to be sweating profusely with abnormal body temp per wife.   ED Course: Initial vital signs showed HR >180 beats/minute, BP >160 mm Hg, the RR 30s-40s breaths/minute, and the oxygen saturation 80s% on NRB and a temperature of 103.49F (39.6C).    Pertinent Labs/Diagnostics Findings: Na+/ K+:138/3.9  Glucose:127 CO2 21 WBC: unremarkable Lactic acid: 1.5~2.6 CK: COVID PCR: Negative troponin:106~7453   BNP:573.5  VBG: pO2 pend; pCO2 37; pH 7.49;  HCO3 28.2, %O2 Sat 43.7.  CXR> CTH> CTA Chest> CT Abd/pelvis>see report   Patient's agitation and restlessness worsen with HR up in the 170s to 180s.  2 mg of Ativan was trialed but patient did not  calm down. Given worsening symptoms and high risk for decompensation, patient intubated for airway protection. He was started on broad-spectrum antibiotics Ampicillin, vanc, Ceftriaxone and Acyclovir due to unclear source for possible infection and cover broadly to include treatment for meningitis. PCCM consulted for admission.  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 and fell off, then he got back onto the horse and fell again on asphalt), HFrEF, ischemic cardiomyopathy, Hyperlipidemia, SVT, CAD, Depression, GERD, Hearing loss of both ears, MI, CVA, Hypertension, Hypotestosteronemia, ICD in place [facility] [employer] D433 DEFIBRILLATOR, RESONATE EL DR DF4 S/N: [phone] implanted 11/21/2021), and OSA on CPAP     Significant Hospital Events: Including procedures, antibiotic start and stop dates in addition to other pertinent events   5/26: Admitted to ICU with acute altered mental status and respiratory failure in the setting of suspected NMS versus sepsis requiring intubation 5/27: fever improved, continued on vasopressors 5/28: extubated in the afternoon. Respiratory failure overnight, re-intubated 5/29: vented, increased oxygen requirements. Family at the bedside. Heart failure consult placed 5/30: vent requirements improved, diuresing, sedated   Interim History / Subjective:  Remains vented and sedated  Objective    Blood pressure 107/71, pulse 79, temperature 97.9 F (36.6 C), temperature source Bladder, resp. rate 16, height 5' 5.98" (1.676 m), weight 86.7 kg, SpO2 97%. CVP:  [4 mmHg-22 mmHg] 11 mmHg  Vent Mode: PRVC FiO2 (%):  [50 %-70 %] 50 % Set Rate:  [15 bmp] 15 bmp Vt Set:  [500 mL] 500 mL  PEEP:  [8 cmH20-12 cmH20] 8 cmH20 Plateau Pressure:  [19 cmH20-22 cmH20] 19 cmH20   Intake/Output Summary (Last 24 hours) at  09/18/2023 0915 Last data filed at 09/18/2023 0908 Gross per 24 hour  Intake 2366.49 ml  Output 4675 ml  Net -2308.51 ml   Filed Weights   09/16/23 0342 09/17/23 0500 09/18/23 0500  Weight: 91.1 kg 91.1 kg 86.7 kg    Examination:  Physical Exam Constitutional:      General: He is not in acute distress.    Appearance: He is ill-appearing.  Cardiovascular:     Rate and Rhythm: Normal rate and regular rhythm.     Pulses: Normal pulses.     Heart sounds: Normal heart sounds.  Pulmonary:     Breath sounds: No wheezing or rales.     Comments: Ventilated breath sounds Neurological:     Mental Status: He is disoriented.     Assessment and Plan   73 year old male presenting with a few days' history of shortness of breath, viral symptoms, and encephalopathy. He was intubated on presentation to the ED due to agitation for airway protection and admitted to the ICU for further management.  #Acute Hypoxic Respiratory Failure #Toxic Metabolic Encephalopathy  #Fronto-temporal Dementia #Rhinovirus Infection #Aspiration Pneumonia  #Community Acquired Pneumonia #Acute on Chronic HFrEF #Type II NSTEMI  #CAD s/p PCI to LAD #Circulatory Shock  #Severe Sepsis #OSA on CPAP   Neuro - Propofol  and PRN fentanyl  for analgosedation, RASS -1, holding home duloxetine , restarted valproic acid . Report of promethazine-dextromethorphan but wife reports only minimal ingestion (1/4 spoon once). Symptoms unlikely to represent NMS or meningitis. He is on clopidogrel  and the risk of LP outweighs the benefits. Would consider LP after a 5 day washout of clopidogrel . Plan for daily WUA and SBT as tolerated. Goal RASS -1, might consider dexmedetomidine after extubation.  CV - history of HFrEF, on GDMT, and CAD with PCI to LAD. Presents with significant elevation in troponin, which represents NSTEMI secondary to shock and critical illness. This could also be secondary to decompensated heart failure (elevated BNP,  interlobular septal thickening). On nor-epinephrine  for vasopressor support, with goal MAP of > 65 mmHg, with hypotension secondary to sedation. Lactic acid is within normal. Appreciate input form cardiology and heart failure team. He's diuresed with IV furosemide  80 mg bid, with overall I/O -2.4 L yesterday. Appreciate input from advanced heart failure.  Pulm - baseline OSA followed by [facility], on CPAP. Now intubated with respiratory failure secondary to rhinovirus with superimposed bacterial pneumonia (consolidations noted in the bilateral lower lobes). Also noted interlobular septal thickening on my review of the chest CT suggestive of concomitant decompensated heart failure. Extubated yesterday but failed overnight and required to be re-intubated. Will continue to wean vent settings, diurese, treat pneumonia, and work towards liberation off mechanical ventilation.  GI - SUP with PPI, on tube feeds.   Renal - kidney function at baseline and improved. Avoiding nephrotoxins. On IV diuresis.   Endo - ICU glycemic protocol, on hydrocortisone for adjunct treatment of severe pneumonia.   Hem/Onc - was on heparin  gtt for ACS, discontinued after 48 hours of AC.   ID - positive for rhinovirus, has bilateral consolidations in the lower lobes consistent with CAP. Continue with broad spectrum antibiotics, d/c acyclovir and ampicillin given low index of suspicion for meningitis. Will consider LP if remains febrile and with altered mental status.  Best Practice (right click and "Reselect all SmartList Selections" daily)   Diet/type:  tubefeeds DVT prophylaxis systemic heparin  Pressure ulcer(s): N/A GI prophylaxis: PPI Lines: Central line and Arterial Line Foley:  Yes, and it is still needed Code Status:  full code Last date of multidisciplinary goals of care discussion [09/18/2023]  Labs   CBC: Recent Labs  Lab 09/14/23 1835 09/15/23 0244 09/15/23 0612 09/16/23 0359 09/17/23 0523  09/18/23 0430  WBC 9.3 6.6  --  13.3* 9.4 9.3  NEUTROABS 7.5  --   --   --  7.6 7.6  HGB 15.6 13.8  --  12.8* 13.1 12.5*  HCT 45.0 40.3  --  39.2 39.4 36.9*  MCV 91.8 92.9  --  96.1 96.3 95.1  PLT 222 178 172 163 170 173    Basic Metabolic Panel: Recent Labs  Lab 09/15/23 0612 09/15/23 1646 09/16/23 0359 09/17/23 0523 09/17/23 1006 09/17/23 2058 09/18/23 0430  NA 136 138 140 142 142  --  143  K 2.8* 5.0 4.4 5.3* 4.5 3.5 4.2  CL 107 109 111 110 110  --  106  CO2 20* 22 20* 24 24  --  25  GLUCOSE 145* 147* 136* 147* 122*  --  145*  BUN 17 17 18  32* 34*  --  38*  CREATININE 1.05 1.08 0.88 1.14 1.19  --  1.09  CALCIUM  7.7* 7.7* 6.9* 8.1* 7.7*  --  7.6*  MG 2.2  --  2.3 2.7*  --  2.3 2.6*  PHOS 2.4*  --  3.0 2.6  --   --  3.4   GFR: Estimated Creatinine Clearance: 60.5 mL/min (by C-G formula based on SCr of 1.09 mg/dL). Recent Labs  Lab 09/14/23 1835 09/14/23 2120 09/15/23 0128 09/15/23 0244 09/15/23 0614 09/16/23 0359 09/17/23 0523 09/18/23 0430  PROCALCITON  --   --   --   --  3.43  --   --   --   WBC 9.3  --   --  6.6  --  13.3* 9.4 9.3  LATICACIDVEN 1.5 2.6* 1.4  --   --   --   --   --     Liver Function Tests: Recent Labs  Lab 09/14/23 1835 09/15/23 0308 09/16/23 0359  AST 25 76* 59*  ALT 24 26 26   ALKPHOS 55 40 34*  BILITOT 0.9 0.8 0.5  PROT 7.2 5.8* 5.5*  ALBUMIN 4.0 3.1* 2.8*   No results for input(s): "LIPASE", "AMYLASE" in the last 168 hours. No results for input(s): "AMMONIA" in the last 168 hours.  ABG    Component Value Date/Time   PHART 7.36 09/18/2023 0500   PCO2ART 50 (H) 09/18/2023 0500   PO2ART 166 (H) 09/18/2023 0500   HCO3 28.2 (H) 09/18/2023 0500   ACIDBASEDEF 0.2 09/17/2023 0834   O2SAT 91.4 09/18/2023 0751     Coagulation Profile: Recent Labs  Lab 09/15/23 0244  INR 1.2    Cardiac Enzymes: Recent Labs  Lab 09/14/23 1835 09/15/23 0308 09/17/23 0523  CKTOTAL 136 612* 198    HbA1C: No results found for:  "HGBA1C"  CBG: Recent Labs  Lab 09/17/23 1530 09/17/23 1921 09/17/23 2341 09/18/23 0338 09/18/23 0743  GLUCAP 114* 152* 140* 124* 125*    Review of Systems:   N/A  Past Medical History:  He,  has a past medical history of 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), Asperger's syndrome, 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), CAD (coronary artery disease) (09/21/2013), 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), Chronic left shoulder pain (04/03/2016), 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), Cognitive deficit as late effect  of traumatic brain injury [facility]) (03/06/2016), Coronary artery disease, Dementia (HCC), Depression, Disorder of male genital organs (07/11/2013), Dyspnea, Encounter for long-term (current) use of medications (11/21/2014), Erectile dysfunction (07/11/2013), Executive function deficit (03/06/2016), GERD (gastroesophageal reflux disease), Headache, Hearing loss, Hyperlipidemia, Hypertension, Hypogonadism, male (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   Surgical History:   Past Surgical History:  Procedure Laterality Date   BACK SURGERY  2011   rods and screws   CLOSED REDUCTION NASAL FRACTURE Bilateral 06/11/2018   Procedure: CLOSED REDUCTION NASAL FRACTURE;  Surgeon: [name], MD;  Location: [facility] ORS;  Service: ENT;  Laterality: Bilateral;   COLONOSCOPY  2015   CORONARY ANGIOPLASTY     2015 stent   CORONARY STENT INTERVENTION N/A 06/08/2019   Procedure: CORONARY  STENT INTERVENTION;  Surgeon: Antonette Batters, MD;  Location: [facility] INVASIVE CV LAB;  Service: Cardiovascular;  Laterality: N/A;   CYSTOSCOPY  1984   EYE SURGERY Bilateral 2005,2006,2009   detached retina, cataract   FOOT ARTHRODESIS Left 05/30/2015   Procedure: ARTHRODESIS FOOT STJ LT FOOT;  Surgeon: Anell Baptist, DPM;  Location: [facility];  Service: Podiatry;  Laterality: Left;  WITH POPLITEAL BLOCK   FOOT SURGERY Left    HERNIA REPAIR Right 1969   inguinal   LEFT HEART CATH AND CORONARY ANGIOGRAPHY Left 06/08/2019   Procedure: LEFT HEART CATH AND CORONARY ANGIOGRAPHY;  Surgeon: Antonette Batters, MD;  Location: [facility] INVASIVE CV LAB;  Service: Cardiovascular;  Laterality: Left;   SEPTOPLASTY Bilateral 06/11/2018   Procedure: SEPTOPLASTY;  Surgeon: [name], MD;  Location: [facility] ORS;  Service: ENT;  Laterality: Bilateral;   SHOULDER SURGERY Right 2004   skull surgery     TOE SURGERY Right 2009   TONSILLECTOMY  2008     Social History:   reports that he has never smoked. He has never used smokeless tobacco. He reports that he does not drink alcohol and does not use drugs.   Family History:  His family history includes Asperger's syndrome in his mother.   Allergies Allergies  Allergen Reactions   Promethazine Other (See Comments)    Severe neuroleptic malignant syndrome requiring intubation   Mirtazapine Other (See Comments)    Other reaction(s): Other (See Comments) Irritability/anger, increased appetite, worsening depression Irritability/anger, increased appetite, worsening depression      Home Medications  Prior to Admission medications   Medication Sig Start Date End Date Taking? Authorizing Provider  acetaminophen  (TYLENOL ) 500 MG tablet Take 1,000 mg by mouth every 6 (six) hours as needed for mild pain or moderate pain.   Yes [provider]  aspirin  EC 81 MG tablet Take 81 mg by mouth daily.   Yes [provider]  B Complex-C (SUPER B  COMPLEX PO) Take 1 tablet by mouth daily.   Yes [provider]  Calcium  Carbonate-Vitamin D  600-200 MG-UNIT CAPS Take 1 tablet by mouth 2 (two) times daily.    Yes [provider]  carvedilol  (COREG ) 3.125 MG tablet Take 3.125 mg by mouth daily.  11/03/18  Yes [provider]  cefdinir (OMNICEF) 300 MG capsule Take 300 mg by mouth 2 (two) times daily. 09/14/23 09/21/23 Yes [provider]  clindamycin  (CLEOCIN  T) 1 % lotion Apply 1 application topically 2 (two) times daily as needed.    Yes [provider]  clopidogrel  (PLAVIX ) 75 MG tablet Take 75 mg by mouth daily. 09/27/18  Yes [provider]  divalproex  (DEPAKOTE  ER) 250 MG 24 hr tablet Total of 750 mg daily. Take along with 500 mg tab 08/24/23  Yes Hisada, Ivan Marion, MD  divalproex  (DEPAKOTE  ER) 500 MG 24 hr tablet Take 1 tablet (500 mg total) by mouth daily. 08/13/23 10/12/23 Yes Todd Fossa, MD  DULoxetine  (CYMBALTA ) 60 MG capsule Take 60 mg by mouth 2 (two) times daily.  11/17/18  Yes [provider]  FIBER PO Take 1 tablet by mouth 2 (two) times daily.    Yes [provider]  furosemide  (LASIX ) 20 MG tablet Take 20 mg by mouth daily. 11/03/18  Yes [provider]  latanoprost (XALATAN) 0.005 % ophthalmic solution Place 1 drop into the left eye daily. 11/06/21  Yes [provider]  losartan (COZAAR) 100 MG tablet Take 100 mg by mouth daily.   Yes [provider]  Melatonin 5 MG TABS Take 10 mg by mouth at bedtime. 30 min before bed   Yes [provider]  omeprazole (PRILOSEC) 20 MG capsule Take 20 mg by mouth daily.   Yes [provider]  Probiotic Product (PROBIOTIC DAILY PO) Take 1 tablet by mouth daily. Senior   Yes [provider]  silodosin  (RAPAFLO ) 8 MG CAPS capsule TAKE 1 CAPSULE BY MOUTH DAILY WITH BREAKFAST Patient taking differently: Take 8 mg by mouth daily. 03/16/23  Yes Stoioff, Kizzie Perks, MD  sodium chloride  (OCEAN)  0.65 % SOLN nasal spray Place 2 sprays into both nostrils as needed for congestion.   Yes [provider]  tadalafil  (CIALIS ) 5 MG tablet Take 1 tablet by mouth once daily 08/03/23  Yes Stoioff, Scott C, MD  SYRINGE-NEEDLE, DISP, 3 ML (B-D 3CC LUER-LOK SYR 22GX1-1/2) 22G X 1-1/2" 3 ML MISC USE AS DIRECTED WITH TESTOSTERONE  08/24/23   Stoioff, Kizzie Perks, MD  testosterone  cypionate (DEPOTESTOSTERONE CYPIONATE) 200 MG/ML injection Inject 0.3 mLs (60 mg total) into the muscle once a week. DISCARD REMAINING AS THESE ARE SINGLE USE VIALS. Patient taking differently: Inject 60 mg into the muscle every 14 (fourteen) days. EVERY OTHER SATURDAY. DISCARD REMAINING AS THESE ARE SINGLE USE VIALS. 06/16/23   Geraline Knapp, MD     Critical care time: 39 minutes    Vergia Glasgow, MD [facility] 09/18/2023 9:30 AM

## 2023-09-18 NOTE — Progress Notes (Addendum)
 Advanced Heart Failure Rounding Note  Cardiologist: None  Chief Complaint: Acute hypoxic respiratory failure Subjective:    CVP 9. Co-ox pending. On NE 4.  4.5L UOP (net neg 2.4L). Weight down 9lbs. Vent settings weaned to 50% and PEEP 10. Afebrile off the cooling blanket.  Sedated/Intubated. Daughter at bedside.   Objective:    Weight Range: 86.7 kg Body mass index is 30.87 kg/m.   Vital Signs:   Temp:  [97.9 F (36.6 C)-102.9 F (39.4 C)] 98.6 F (37 C) (05/30 0400) Pulse Rate:  [25-102] 66 (05/30 0600) Resp:  [12-32] 17 (05/30 0600) BP: (73-130)/(55-85) 107/74 (05/30 0600) SpO2:  [95 %-98 %] 98 % (05/30 0600) FiO2 (%):  [50 %-80 %] 50 % (05/30 0603) Weight:  [86.7 kg] 86.7 kg (05/30 0500) Last BM Date :  (PTA)  Weight change: Filed Weights   09/16/23 0342 09/17/23 0500 09/18/23 0500  Weight: 91.1 kg 91.1 kg 86.7 kg   Intake/Output:  Intake/Output Summary (Last 24 hours) at 09/18/2023 0711 Last data filed at 09/18/2023 1610 Gross per 24 hour  Intake 2143.76 ml  Output 4550 ml  Net -2406.24 ml    Physical Exam    CVP 9 General: Critically-ill appearing. Compliant with vent Cardiac: JVP difficult to assess. S1 and S2 present. No murmurs or rub. Resp: Lung sounds coarse throughout Extremities: Warm and dry. No peripheral edema.  Neuro: Sedated Lines/Devices:  RIJ CVL, foley, ETT, OGT  Telemetry   SR in 60s. Bi- and trigeminy overnight 2000-0000 (personally reviewed)  EKG    No new EKG to review  Labs    CBC Recent Labs    09/17/23 0523 09/18/23 0430  WBC 9.4 9.3  NEUTROABS 7.6 7.6  HGB 13.1 12.5*  HCT 39.4 36.9*  MCV 96.3 95.1  PLT 170 173   Basic Metabolic Panel Recent Labs    96/04/54 0523 09/17/23 1006 09/17/23 2058 09/18/23 0430  NA 142 142  --  143  K 5.3* 4.5 3.5 4.2  CL 110 110  --  106  CO2 24 24  --  25  GLUCOSE 147* 122*  --  145*  BUN 32* 34*  --  38*  CREATININE 1.14 1.19  --  1.09  CALCIUM  8.1* 7.7*  --  7.6*   MG 2.7*  --  2.3 2.6*  PHOS 2.6  --   --  3.4   Liver Function Tests Recent Labs    09/16/23 0359  AST 59*  ALT 26  ALKPHOS 34*  BILITOT 0.5  PROT 5.5*  ALBUMIN 2.8*   Cardiac Enzymes Recent Labs    09/17/23 0523  CKTOTAL 198   BNP (last 3 results) Recent Labs    09/14/23 1835 09/17/23 0523  BNP 573.5* 968.1*   Fasting Lipid Panel Recent Labs    09/18/23 0430  TRIG 146   Imaging   No results found.  Medications:    Scheduled Medications:  aspirin   81 mg Per Tube Daily   atorvastatin   40 mg Per Tube Daily   Chlorhexidine  Gluconate Cloth  6 each Topical Daily   docusate  100 mg Per Tube BID   feeding supplement (PROSource TF20)  60 mL Per Tube BID   free water   30 mL Per Tube Q4H   furosemide   80 mg Intravenous BID   hydrocortisone  sod succinate (SOLU-CORTEF ) inj  50 mg Intravenous Q8H   ipratropium-albuterol   3 mL Nebulization Q6H   mouth rinse  15 mL Mouth Rinse Q2H  pantoprazole  (PROTONIX ) IV  40 mg Intravenous QHS   polyethylene glycol  17 g Per Tube Daily   valproic  acid  250 mg Per Tube TID    Infusions:  azithromycin  Stopped (09/17/23 1227)   cefTRIAXone  (ROCEPHIN )  IV Stopped (09/17/23 1126)   feeding supplement (VITAL AF 1.2 CAL) 50 mL/hr at 09/18/23 0440   norepinephrine  (LEVOPHED ) Adult infusion 4 mcg/min (09/18/23 0622)   propofol  (DIPRIVAN ) infusion 45 mcg/kg/min (09/18/23 0623)    PRN Medications: fentaNYL  (SUBLIMAZE ) injection, ipratropium-albuterol , naphazoline-glycerin   Patient Profile   VONN SLIGER III is a 73 y.o. 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   Assessment/Plan   1. Acute hypoxemic respiratory failure: In setting of high fever, respiratory virus panel + for rhinovirus.  Suspect viral infection with secondary bacterial PNA (possible  aspiration).  Also a significant component of pulmonary edema.  CXR much worse this morning when he was re-intubated.  Concern for ARDS based on appearance, but hope we can improve with diuresis.  - Coox 73 yesterday, pending today. CVP 9 today, down from 22 (of note, PEEP also at 10 from 15) - Continue Lasix  80 mg IV bid another day, diuresing well - Continue ceftriaxone /azithromycin  for CAP coverage per CCM.  2. Shock: This may be primarily septic shock/shock related to sedation (propofol ).  Procalcitonin elevated at 3.43. Lactate has cleared. On NE at 4.  - Continue abx as above.  - Continue to wean NE as tolerated - Stress dose steroids per CCM.  3. Acute on chronic systolic CHF: Ischemic cardiomyopathy.  Echo this admission with EF 35-40% with normal RV and moderate MR, this is consistent with prior echoes. Initially, Lasix  was held and he had volume rescuscitation. CXR concerning for pulmonary edema. Co-ox pending today. CVP 9, down from 22 (in the setting of good diuretic response and decrease in PEEP) - Wean NE as BP tolerates. Currently on 4 - Continue Lasix  80 mg IV bid another day, diuresing well - Once BP stable off NE, can start GDMT titration.  4. ID: Rhinovirus infection, suspect secondary bacterial PNA with sepsis, procalcitonin 3.43.  - Continue ceftriaxone /azithromycin .  5. CAD: NSTEMI with [place]-TnI up to 15[address].  He did have some chest pain at time of admission.  Echo was unchanged with EF 35-40%.  He was in shock initially and has developed volume overload, but TnI seems high for demand ischemia alone.  Cannot rule out ACS with plaque rupture.  - Off heparin . Completed 48h. Add Lovenox  for DVT ppx. - Continue ASA 81 + atorva 40 mg daily - Once he is extubated and stabilized, would favor coronary angiography.    Length of Stay: 4  CRITICAL CARE Performed by: [place] Lee  Total critical care time: 14 minutes  Critical care time was exclusive of separately billable procedures  and treating other patients.  Critical care was necessary to treat or prevent imminent or life-threatening deterioration.  Critical care was time spent personally by me on the following activities: development of treatment plan with patient and/or surrogate as well as nursing, discussions with consultants, evaluation of patient's response to treatment, examination of patient, obtaining history from patient or surrogate, ordering and performing treatments and interventions, ordering and review of laboratory studies, ordering and review of radiographic studies, pulse oximetry and re-evaluation of patient's condition.  [place] Lee, NP  09/18/2023, 7:11 AM  Advanced Heart Failure Team Pager [phone] (M-F; 7a - 5p)  Please contact [facility] for night-coverage after hours (5p -7a ) and weekends on [email or web address]  Patient seen with NP, I formulated the plan and agree with the above note.   I/Os net negative 2406 cc, weight trending down.  FiO2 down to 0.5. CVP 10 on my read, co-ox inaccurate. BUN mildly higher, creatinine stable.   General: Sedated on vent Neck: JVP 10 cm, no thyromegaly or thyroid nodule.  Lungs: Clear to auscultation anteriorly CV: Nondisplaced PMI.  Heart regular S1/S2, no S3/S4, no murmur.  No peripheral edema.   Abdomen: Soft, nontender, no hepatosplenomegaly, no distention.  Skin: Intact without lesions or rashes.  Neurologic: Sedated, awakens with Propofol  wean.  Extremities: No clubbing or cyanosis.  HEENT: Normal.   Patient diuresed well yesterday, oxygen requirement decreasing.  Weight trending down, CVP 10.  - Would give Lasix  80 mg IV bid for 1 more day, reassess tomorrow. - Repeat pCXR.   He remains on NE 4, co-ox in accurate. - Resend co-ox.  - Wean NE as BP allows, suspect we should be able to wean today as sedation comes off.  - GDMT held until off NE.   Continue abx for PNA per CCM.   CRITICAL CARE Performed by: Peder Bourdon  Total critical care  time: 40 minutes  Critical care time was exclusive of separately billable procedures and treating other patients.  Critical care was necessary to treat or prevent imminent or life-threatening deterioration.  Critical care was time spent personally by me on the following activities: development of treatment plan with patient and/or surrogate as well as nursing, discussions with consultants, evaluation of patient's response to treatment, examination of patient, obtaining history from patient or surrogate, ordering and performing treatments and interventions, ordering and review of laboratory studies, ordering and review of radiographic studies, pulse oximetry and re-evaluation of patient's condition.  Peder Bourdon 09/18/2023 7:58 AM

## 2023-09-18 NOTE — TOC Progression Note (Signed)
 Transition of Care [facility]) - Progression Note    Patient Details  Name: Blake [name] MRN: [id number] Date of Birth: [birth year]  [facility] of [facility]) CM/SW Contact  Lamyia Cdebaca A Kathlee Barnhardt, RN Phone Number: 09/18/2023, 2:38 PM  Clinical Narrative:    Chart reviewed.  Noted that patient was admitted with altered mental status and Respiratory failure. Noted that was intubated on admission to ICU.  Patient was extubated on 05/28 and had to be re-intubated. Patient remains intubated and sedated and critically ill.  Heart Failure and Cardiology consulted.    TOC will continue to follow for discharge planning.           Expected Discharge Plan and Services                                               Social Determinants of Health (SDOH) Interventions SDOH Screenings   Food Insecurity: No Food Insecurity (09/15/2023)  Housing: Low Risk  (09/15/2023)  Transportation Needs: No Transportation Needs (09/15/2023)  Utilities: Not At Risk (09/15/2023)  Depression (PHQ2-9): Low Risk  (08/04/2023)  Social Connections: Patient Unable To Answer (09/15/2023)  Tobacco Use: Low Risk  (09/14/2023)   Received from [facility]    Readmission Risk Interventions     No data to display

## 2023-09-19 ENCOUNTER — Inpatient Hospital Stay

## 2023-09-19 DIAGNOSIS — J9601 Acute respiratory failure with hypoxia: Secondary | ICD-10-CM | POA: Diagnosis not present

## 2023-09-19 DIAGNOSIS — I472 Ventricular tachycardia, unspecified: Secondary | ICD-10-CM

## 2023-09-19 DIAGNOSIS — J690 Pneumonitis due to inhalation of food and vomit: Secondary | ICD-10-CM | POA: Diagnosis not present

## 2023-09-19 DIAGNOSIS — I5023 Acute on chronic systolic (congestive) heart failure: Secondary | ICD-10-CM | POA: Diagnosis not present

## 2023-09-19 DIAGNOSIS — G928 Other toxic encephalopathy: Secondary | ICD-10-CM | POA: Diagnosis not present

## 2023-09-19 LAB — BASIC METABOLIC PANEL WITH GFR
Anion gap: 10 (ref 5–15)
BUN: 46 mg/dL — ABNORMAL HIGH (ref 8–23)
CO2: 29 mmol/L (ref 22–32)
Calcium: 7.5 mg/dL — ABNORMAL LOW (ref 8.9–10.3)
Chloride: 104 mmol/L (ref 98–111)
Creatinine, Ser: 1.02 mg/dL (ref 0.61–1.24)
GFR, Estimated: >60 mL/min (ref >60)
Glucose, Bld: 157 mg/dL — ABNORMAL HIGH (ref 70–99)
Potassium: 3.8 mmol/L (ref 3.5–5.1)
Sodium: 143 mmol/L (ref 135–145)

## 2023-09-19 LAB — PROCALCITONIN: Procalcitonin: 1.78 ng/mL

## 2023-09-19 LAB — GLUCOSE, CAPILLARY
Glucose-Capillary: 120 mg/dL — ABNORMAL HIGH (ref 70–99)
Glucose-Capillary: 133 mg/dL — ABNORMAL HIGH (ref 70–99)
Glucose-Capillary: 132 mg/dL — ABNORMAL HIGH (ref 70–99)
Glucose-Capillary: 115 mg/dL — ABNORMAL HIGH (ref 70–99)
Glucose-Capillary: 122 mg/dL — ABNORMAL HIGH (ref 70–99)

## 2023-09-19 LAB — BLOOD GAS, ARTERIAL
Acid-Base Excess: 8.8 mmol/L — ABNORMAL HIGH (ref 0.0–2.0)
Bicarbonate: 34.6 mmol/L — ABNORMAL HIGH (ref 20.0–28.0)
FIO2: 40 %
O2 Saturation: 98.7 %
PEEP: 5 cmH2O
Patient temperature: 37
RATE: 15 {breaths}/min
Spontaneous VT: 500 mL
pCO2 arterial: 51 mmHg — ABNORMAL HIGH (ref 32–48)
pH, Arterial: 7.44 (ref 7.35–7.45)
pO2, Arterial: 84 mmHg (ref 83–108)

## 2023-09-19 LAB — CBC WITH DIFFERENTIAL/PLATELET
Abs Immature Granulocytes: 0.08 10*3/uL — ABNORMAL HIGH (ref 0.00–0.07)
Basophils Absolute: 0 10*3/uL (ref 0.0–0.1)
Basophils Relative: 0 %
Eosinophils Absolute: 0 10*3/uL (ref 0.0–0.5)
Eosinophils Relative: 0 %
HCT: 38.1 % — ABNORMAL LOW (ref 39.0–52.0)
Hemoglobin: 12.3 g/dL — ABNORMAL LOW (ref 13.0–17.0)
Immature Granulocytes: 1 %
Lymphocytes Relative: 6 %
Lymphs Abs: 0.4 10*3/uL — ABNORMAL LOW (ref 0.7–4.0)
MCH: 31.5 pg (ref 26.0–34.0)
MCHC: 32.3 g/dL (ref 30.0–36.0)
MCV: 97.4 fL (ref 80.0–100.0)
Monocytes Absolute: 0.8 10*3/uL (ref 0.1–1.0)
Monocytes Relative: 12 %
Neutro Abs: 5.6 10*3/uL (ref 1.7–7.7)
Neutrophils Relative %: 81 %
Platelets: 179 10*3/uL (ref 150–400)
RBC: 3.91 MIL/uL — ABNORMAL LOW (ref 4.22–5.81)
RDW: 14.7 % (ref 11.5–15.5)
WBC: 6.9 10*3/uL (ref 4.0–10.5)
nRBC: 0 % (ref 0.0–0.2)

## 2023-09-19 LAB — PHOSPHORUS: Phosphorus: 2.5 mg/dL (ref 2.5–4.6)

## 2023-09-19 MED ORDER — POLYETHYLENE GLYCOL 3350 17 G PO PACK
17.0000 g | PACK | Freq: Two times a day (BID) | ORAL | Status: DC
  Administered 2023-09-19 – 2023-09-29 (×7): 17 g
  Filled 2023-09-19 (×7): qty 1

## 2023-09-19 MED ORDER — PIPERACILLIN-TAZOBACTAM 3.375 G IVPB
3.3750 g | Freq: Three times a day (TID) | INTRAVENOUS | Status: AC
  Administered 2023-09-19 – 2023-09-21 (×8): 3.375 g via INTRAVENOUS
  Filled 2023-09-19 (×8): qty 50

## 2023-09-19 MED ORDER — DIAZEPAM 5 MG/ML IJ SOLN
5.0000 mg | Freq: Three times a day (TID) | INTRAMUSCULAR | Status: DC
  Administered 2023-09-19 – 2023-09-20 (×2): 5 mg via INTRAVENOUS
  Filled 2023-09-19 (×2): qty 2

## 2023-09-19 MED ORDER — FUROSEMIDE 10 MG/ML IJ SOLN
60.0000 mg | Freq: Two times a day (BID) | INTRAMUSCULAR | Status: DC
  Administered 2023-09-19 – 2023-09-20 (×3): 60 mg via INTRAVENOUS
  Filled 2023-09-19 (×3): qty 6

## 2023-09-19 MED ORDER — HYDROCORTISONE SOD SUC (PF) 100 MG IJ SOLR
50.0000 mg | Freq: Two times a day (BID) | INTRAMUSCULAR | Status: DC
  Administered 2023-09-19 – 2023-09-20 (×3): 50 mg via INTRAVENOUS
  Filled 2023-09-19 (×3): qty 2

## 2023-09-19 NOTE — Progress Notes (Deleted)
 [facility] MD/PA/NP OP Progress Note  09/19/2023 11:43 AM Blake [name]  MRN:  [id number]  Chief Complaint: No chief complaint on file.  HPI: *** Visit Diagnosis: No diagnosis found.  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   Coronary artery disease    Dementia (HCC)    Depression    Disorder of male genital organs 07/11/2013   Dyspnea    Encounter for long-term (current) use of medications 11/21/2014   Erectile dysfunction 07/11/2013   Executive function deficit 03/06/2016   GERD (gastroesophageal reflux disease)    Headache    stress   Hearing loss    Hyperlipidemia    Hypertension    Hypogonadism, 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   Major depression, recurrent, chronic (HCC) 03/06/2016   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   Mixed hyperlipidemia 02/02/2014   Myocardial infarction (HCC) 08/2013   OCD (obsessive compulsive disorder)    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   Other specified disorder of male genital organs(608.89) 07/11/2013   Prostatic hypertrophy    Pseudophakia of both eyes 04/20/2015   Raynaud's disease    Reduced libido 07/11/2013   Repeated falls    weak left ankle   Scoliosis    Stroke (HCC) 2/16   "light"   Synovitis    knees, ankles    Past Surgical History:  Procedure Laterality Date   BACK SURGERY  2011   rods and screws   CLOSED REDUCTION NASAL FRACTURE Bilateral 06/11/2018   Procedure: CLOSED REDUCTION NASAL FRACTURE;  Surgeon:  [name], MD;  Location: [facility] ORS;  Service: ENT;  Laterality: Bilateral;   COLONOSCOPY  2015   CORONARY ANGIOPLASTY     2015 stent   CORONARY STENT INTERVENTION N/A 06/08/2019   Procedure: CORONARY STENT INTERVENTION;  Surgeon: Antonette Batters, MD;  Location: [facility] INVASIVE CV LAB;  Service: Cardiovascular;  Laterality: N/A;   CYSTOSCOPY  1984   EYE SURGERY Bilateral 2005,2006,2009   detached retina, cataract   FOOT ARTHRODESIS Left 05/30/2015   Procedure: ARTHRODESIS FOOT STJ LT FOOT;  Surgeon: Anell Baptist, DPM;  Location: [facility];  Service: Podiatry;  Laterality: Left;  WITH POPLITEAL BLOCK   FOOT SURGERY Left    HERNIA REPAIR Right 1969   inguinal   LEFT HEART CATH AND CORONARY ANGIOGRAPHY Left 06/08/2019   Procedure: LEFT HEART CATH AND CORONARY ANGIOGRAPHY;  Surgeon: Antonette Batters, MD;  Location: [facility] INVASIVE CV LAB;  Service: Cardiovascular;  Laterality: Left;   SEPTOPLASTY Bilateral 06/11/2018   Procedure: SEPTOPLASTY;  Surgeon: [name], MD;  Location: [facility] ORS;  Service: ENT;  Laterality: Bilateral;   SHOULDER SURGERY Right 2004   skull surgery     TOE SURGERY Right 2009   TONSILLECTOMY  2008    Family Psychiatric History: ***  Family History:  Family History  Problem Relation Age of Onset   Asperger's syndrome Mother     Social History:  Social History  Socioeconomic History   Marital status: Married    Spouse name: cindy   Number of children: 1   Years of education: Not on file   Highest education level: Professional school degree (e.g., MD, DDS, DVM, JD)  Occupational History   Not on file  Tobacco Use   Smoking status: Never   Smokeless tobacco: Never  Vaping Use   Vaping status: Never Used  Substance and Sexual Activity   Alcohol use: No   Drug use: No   Sexual activity: Not Currently    Birth control/protection: None  Other Topics Concern   Not on file  Social History Narrative   Not on file   Social Drivers of  Health   Financial Resource Strain: Not on file  Food Insecurity: No Food Insecurity (09/15/2023)   Hunger Vital Sign    Worried About Running Out of Food in the Last Year: Never true    Ran Out of Food in the Last Year: Never true  Transportation Needs: No Transportation Needs (09/15/2023)   PRAPARE - Administrator, Civil Service (Medical): No    Lack of Transportation (Non-Medical): No  Physical Activity: Not on file  Stress: Not on file  Social Connections: Patient Unable To Answer (09/15/2023)   Social Connection and Isolation Panel [NHANES]    Frequency of Communication with Friends and Family: Patient unable to answer    Frequency of Social Gatherings with Friends and Family: Patient unable to answer    Attends Religious Services: Patient unable to answer    Active Member of Clubs or Organizations: Patient unable to answer    Attends Banker Meetings: Patient unable to answer    Marital Status: Patient unable to answer    Allergies:  Allergies  Allergen Reactions   Promethazine Other (See Comments)    Severe neuroleptic malignant syndrome requiring intubation   Mirtazapine Other (See Comments)    Other reaction(s): Other (See Comments) Irritability/anger, increased appetite, worsening depression Irritability/anger, increased appetite, worsening depression     Metabolic Disorder Labs: No results found for: "HGBA1C", "MPG" No results found for: "PROLACTIN" Lab Results  Component Value Date   CHOL 112 05/23/2014   TRIG 146 09/18/2023   HDL 41 05/23/2014   VLDL 16 05/23/2014   LDLCALC 55 05/23/2014   LDLCALC 124 (H) 09/13/2013   Lab Results  Component Value Date   TSH 1.658 09/15/2023   TSH 3.62 09/13/2013    Therapeutic Level Labs: No results found for: "LITHIUM" Lab Results  Component Value Date   VALPROATE 49 (L) 09/14/2023   VALPROATE 42 (L) 09/03/2023   No results found for: "CBMZ"  Current Medications: No current  facility-administered medications for this visit.   No current outpatient medications on file.   Facility-Administered Medications Ordered in Other Visits  Medication Dose Route Frequency Provider Last Rate Last Admin   amiodarone  (NEXTERONE  PREMIX) 360-4.14 MG/200ML-% (1.8 mg/mL) IV infusion  30 mg/hr Intravenous Continuous Keene, Jeremiah D, NP 16.67 mL/hr at 09/19/23 1042 30 mg/hr at 09/19/23 1042   aspirin  chewable tablet 81 mg  81 mg Per Tube Daily Vergia Glasgow, MD   81 mg at 09/19/23 0932   atorvastatin  (LIPITOR) tablet 40 mg  40 mg Per Tube Daily Decoste, Gabriella, PA-C   40 mg at 09/19/23 0931   azithromycin  (ZITHROMAX ) 500 mg in sodium chloride  0.9 % 250 mL IVPB  500 mg Intravenous Q24H Vergia Glasgow, MD 250 mL/hr at 09/19/23 1124 500 mg at 09/19/23 1124  Chlorhexidine  Gluconate Cloth 2 % PADS 6 each  6 each Topical Daily Vergia Glasgow, MD   6 each at 09/18/23 0832   docusate (COLACE) 50 MG/5ML liquid 100 mg  100 mg Per Tube BID Rust-Chester, Britton L, NP   100 mg at 09/19/23 0933   enoxaparin  (LOVENOX ) injection 40 mg  40 mg Subcutaneous Q24H Lee, Jordan, NP   40 mg at 09/19/23 0830   feeding supplement (PROSource TF20) liquid 60 mL  60 mL Per Tube BID Vergia Glasgow, MD   60 mL at 09/19/23 0933   feeding supplement (VITAL AF 1.2 CAL) liquid 1,000 mL  1,000 mL Per Tube Continuous Vergia Glasgow, MD   Stopped at 09/19/23 0953   fentaNYL  (SUBLIMAZE ) injection 25-100 mcg  25-100 mcg Intravenous Q30 min PRN Rust-Chester, Gara July, NP   100 mcg at 09/19/23 1039   free water  30 mL  30 mL Per Tube Q4H Dgayli, Berneta Brightly, MD   30 mL at 09/19/23 1120   furosemide  (LASIX ) injection 60 mg  60 mg Intravenous BID Vergia Glasgow, MD   60 mg at 09/19/23 1191   hydrocortisone  sodium succinate  (SOLU-CORTEF ) 100 MG injection 50 mg  50 mg Intravenous Q12H Dgayli, Berneta Brightly, MD       ipratropium-albuterol  (DUONEB) 0.5-2.5 (3) MG/3ML nebulizer solution 3 mL  3 mL Nebulization Q6H Ouma, Phoebe Breed, NP    3 mL at 09/19/23 0819   ipratropium-albuterol  (DUONEB) 0.5-2.5 (3) MG/3ML nebulizer solution 3 mL  3 mL Nebulization Q6H PRN Gideon Kussmaul, NP       naphazoline-glycerin  (CLEAR EYES REDNESS) ophth solution 1-2 drop  1-2 drop Both Eyes QID PRN Rust-Chester, Gara July, NP   2 drop at 09/19/23 0831   norepinephrine  (LEVOPHED ) 16 mg in 250mL (0.064 mg/mL) premix infusion  0-40 mcg/min Intravenous Titrated Nelson, Dana G, NP   Stopped at 09/18/23 1657   Oral care mouth rinse  15 mL Mouth Rinse Q2H Gideon Kussmaul, NP   15 mL at 09/19/23 1124   pantoprazole  (PROTONIX ) injection 40 mg  40 mg Intravenous QHS Vergia Glasgow, MD   40 mg at 09/18/23 2102   piperacillin-tazobactam (ZOSYN) IVPB 3.375 g  3.375 g Intravenous Q8H Dgayli, Berneta Brightly, MD       polyethylene glycol (MIRALAX  / GLYCOLAX ) packet 17 g  17 g Per Tube BID Dgayli, Khabib, MD       propofol  (DIPRIVAN ) 1000 MG/100ML infusion  0-50 mcg/kg/min Intravenous Continuous Rust-Chester, Britton L, NP 27.3 mL/hr at 09/19/23 1042 50 mcg/kg/min at 09/19/23 1042   sodium chloride  flush (NS) 0.9 % injection 10-40 mL  10-40 mL Intracatheter Q12H Dgayli, Berneta Brightly, MD   10 mL at 09/19/23 1024   sodium chloride  flush (NS) 0.9 % injection 10-40 mL  10-40 mL Intracatheter PRN Vergia Glasgow, MD       valproic  acid (DEPAKENE ) 250 MG/5ML solution 250 mg  250 mg Per Tube TID Vergia Glasgow, MD   250 mg at 09/19/23 4782     Musculoskeletal: Strength & Muscle Tone: {desc; muscle tone:32375} Gait & Station: {PE GAIT ED NFAO:13086} Patient leans: {Patient Leans:21022755}  Psychiatric Specialty Exam: Review of Systems  There were no vitals taken for this visit.There is no height or weight on file to calculate BMI.  General Appearance: {Appearance:22683}  Eye Contact:  {BHH EYE CONTACT:22684}  Speech:  {Speech:22685}  Volume:  {Volume (PAA):22686}  Mood:  {BHH MOOD:22306}  Affect:  {Affect (PAA):22687}  Thought Process:  {Thought Process  (PAA):22688}  Orientation:  {  BHH ORIENTATION (PAA):22689}  Thought Content: {Thought Content:22690}   Suicidal Thoughts:  {ST/HT (PAA):22692}  Homicidal Thoughts:  {ST/HT (PAA):22692}  Memory:  {BHH MEMORY:22881}  Judgement:  {Judgement (PAA):22694}  Insight:  {Insight (PAA):22695}  Psychomotor Activity:  {Psychomotor (PAA):22696}  Concentration:  {Concentration:21399}  Recall:  {BHH GOOD/FAIR/POOR:22877}  Fund of Knowledge: {BHH GOOD/FAIR/POOR:22877}  Language: {BHH GOOD/FAIR/POOR:22877}  Akathisia:  {BHH YES OR NO:22294}  Handed:  {Handed:22697}  AIMS (if indicated): {Desc; done/not:10129}  Assets:  {Assets (PAA):22698}  ADL's:  {BHH ZOX'W:96045}  Cognition: {chl bhh cognition:304700322}  Sleep:  {BHH GOOD/FAIR/POOR:22877}   Screenings: GAD-7    Flowsheet Row [facility] Visit from [facility]/04/2023 in [facility]  Total GAD-7 Score 0      PHQ2-9    Flowsheet Row Office Visit from 08/04/2023 in [facility]  PHQ-2 Total Score 0      Flowsheet Row ED to Hosp-Admission (Current) from 09/14/2023 in [facility]U Office Visit from 08/04/2023 in [facility] ED from 08/15/2022 in [facility] at [facility]  C-SSRS RISK CATEGORY No Risk No Risk No Risk        Assessment and Plan: ***  Collaboration of Care: Collaboration of Care: [facility] OP Collaboration of WUJW:[id number]}  Patient/Guardian was advised Release of Information must be obtained prior to any record release in order to collaborate their care with an outside provider. Patient/Guardian was advised if they have not already done so to contact the registration department to sign all necessary forms in order for us  to release information regarding their care.   Consent: Patient/Guardian gives verbal consent for treatment and assignment of benefits for services  provided during this visit. Patient/Guardian expressed understanding and agreed to proceed.    Todd Fossa, MD 09/19/2023, 11:43 AM

## 2023-09-19 NOTE — Consult Note (Signed)
 PHARMACY CONSULT NOTE - ELECTROLYTES  Pharmacy Consult for Electrolyte Monitoring and Replacement   Recent Labs: Potassium (mmol/L)  Date Value  09/19/2023 3.8  05/23/2014 4.1   Magnesium  (mg/dL)  Date Value  16/01/9603 2.3  09/13/2013 1.9   Calcium  (mg/dL)  Date Value  54/12/8117 7.5 (L)   Calcium , Total (mg/dL)  Date Value  14/78/2956 8.5   Albumin (g/dL)  Date Value  21/30/8657 2.8 (L)  05/23/2014 3.7   Phosphorus (mg/dL)  Date Value  84/69/6295 2.5   Sodium (mmol/L)  Date Value  09/19/2023 143  05/23/2014 143   Height: 5' 5.98" (167.6 cm) Weight: 86.7 kg (191 lb 2.2 oz) IBW/kg (Calculated) : 63.76 Estimated Creatinine Clearance: 64.6 mL/min (by C-G formula based on SCr of 1.02 mg/dL).  Assessment  Blake [name] is a 73 y.o. male presenting with acute respiratory failure and shock. PMH significant for bilateral fronto temporal encephalomalacia due to severe TBI resulting longstanding cognitive deficits, poor social inhibition, apathy, severe TBI (thrown into a field by a horse and fell off, then he got back onto the horse and fell again on asphalt), HFrEF, ischemic cardiomyopathy, hyperlipidemia, SVT, CAD, depression, GERD, hearing loss of both ears, MI, CVA, HTN, hypotestosteronemia, ICD in place [facility] Scientific D433 DEFIBRILLATOR, RESONATE EL DR DF4 S/N: [id number] implanted 11/21/2021), and OSA on CPAP . Pharmacy has been consulted to monitor and replace electrolytes.  Diet: NPO, intubated MIVF: N/A Pertinent medications: Lasix  80 mg IV BID(last dose 5/30)  Goal of Therapy: Electrolytes within normal limits  Plan:  Electrolytes stable despite diuretics. No replacement warranted Labs tomorrow  Thank you for allowing pharmacy to be a part of this patient's care.  Lakynn Halvorsen A Jarah Pember 09/19/2023 7:20 AM

## 2023-09-19 NOTE — Plan of Care (Signed)
  Problem: Nutrition: Goal: Adequate nutrition will be maintained Outcome: Progressing   Problem: Coping: Goal: Level of anxiety will decrease Outcome: Progressing   Problem: Elimination: Goal: Will not experience complications related to urinary retention Outcome: Progressing   Problem: Pain Managment: Goal: General experience of comfort will improve and/or be controlled Outcome: Progressing   Problem: Safety: Goal: Ability to remain free from injury will improve Outcome: Progressing   Problem: Education: Goal: Knowledge of General Education information will improve Description: Including pain rating scale, medication(s)/side effects and non-pharmacologic comfort measures 09/19/2023 1350 by Bartley Borrow, RN Outcome: Not Progressing 09/19/2023 1350 by Bartley Borrow, RN Outcome: Not Progressing   Problem: Health Behavior/Discharge Planning: Goal: Ability to manage health-related needs will improve 09/19/2023 1350 by Bartley Borrow, RN Outcome: Not Progressing 09/19/2023 1350 by Bartley Borrow, RN Outcome: Not Progressing   Problem: Clinical Measurements: Goal: Ability to maintain clinical measurements within normal limits will improve Outcome: Not Progressing Goal: Will remain free from infection Outcome: Not Progressing   Problem: Activity: Goal: Ability to tolerate increased activity will improve Outcome: Not Progressing

## 2023-09-19 NOTE — Plan of Care (Signed)
  Problem: Education: °Goal: Knowledge of General Education information will improve °Description: Including pain rating scale, medication(s)/side effects and non-pharmacologic comfort measures °Outcome: Not Progressing °  °Problem: Health Behavior/Discharge Planning: °Goal: Ability to manage health-related needs will improve °Outcome: Not Progressing °  °Problem: Clinical Measurements: °Goal: Ability to maintain clinical measurements within normal limits will improve °Outcome: Not Progressing °Goal: Respiratory complications will improve °Outcome: Not Progressing °  °

## 2023-09-19 NOTE — Progress Notes (Signed)
 [facility]    SUBJECTIVE: Patient currently intubated sedated   Vitals:   09/19/23 1035 09/19/23 1054 09/19/23 1100 09/19/23 1119  BP:   122/76   Pulse:   99   Resp:   19   Temp:    99.3 F (37.4 C)  TempSrc:    Bladder  SpO2: 93% 91% 91%   Weight:      Height:         Intake/Output Summary (Last 24 hours) at 09/19/2023 1136 Last data filed at 09/19/2023 1100 Gross per 24 hour  Intake 3750.83 ml  Output 3525 ml  Net 225.83 ml      PHYSICAL EXAM  General: Well developed, well nourished, in no acute distress intubated sedated HEENT:  Normocephalic and atramatic Neck:  No JVD.  Lungs: Clear bilaterally to auscultation and percussion. Heart: HRRR . Normal S1 and S2 without gallops or murmurs.  Abdomen: Bowel sounds are positive, abdomen soft and non-tender  Msk:  Back normal, normal gait. Normal strength and tone for age. Extremities: No clubbing, cyanosis or edema.   Neuro: Intubated sedated Psych: Sedated intubated   LABS: Basic Metabolic Panel: Recent Labs    09/18/23 0430 09/18/23 1348 09/19/23 0409  NA 143 144 143  K 4.2 3.9 3.8  CL 106 105 104  CO2 25 29 29   GLUCOSE 145* 160* 157*  BUN 38* 43* 46*  CREATININE 1.09 1.03 1.02  CALCIUM  7.6* 7.2* 7.5*  MG 2.6* 2.3  --   PHOS 3.4  --  2.5   Liver Function Tests: No results for input(s): "AST", "ALT", "ALKPHOS", "BILITOT", "PROT", "ALBUMIN" in the last 72 hours. No results for input(s): "LIPASE", "AMYLASE" in the last 72 hours. CBC: Recent Labs    09/18/23 0430 09/19/23 0409  WBC 9.3 6.9  NEUTROABS 7.6 5.6  HGB 12.5* 12.3*  HCT 36.9* 38.1*  MCV 95.1 97.4  PLT 173 179   Cardiac Enzymes: Recent Labs    09/17/23 0523  CKTOTAL 198   BNP: Invalid input(s): "POCBNP" D-Dimer: No results for input(s): "DDIMER" in the last 72 hours. Hemoglobin A1C: No results for input(s): "HGBA1C" in the last 72 hours. Fasting Lipid Panel: Recent Labs    09/18/23 0430  TRIG 146   Thyroid Function  Tests: No results for input(s): "TSH", "T4TOTAL", "T3FREE", "THYROIDAB" in the last 72 hours.  Invalid input(s): "FREET3" Anemia Panel: No results for input(s): "VITAMINB12", "FOLATE", "FERRITIN", "TIBC", "IRON", "RETICCTPCT" in the last 72 hours.  DG Chest Port 1 View Result Date: 09/19/2023 CLINICAL DATA:  73 year old male with respiratory failure. EXAM: PORTABLE CHEST 1 VIEW COMPARISON:  Portable AP semi upright view at 0751 hours. FINDINGS: Portable AP semi upright view at 0751 hours. Less rotated to the left now. Pacer and resuscitation pads project over the lower chest. Stable left chest AICD. Endotracheal tube tip remains satisfactory. Stable lines and tubes. Stable low lung volumes, patchy perihilar and lung base opacity. Ventilation is stable with no pneumothorax, pleural effusion identified. And ventilation has improved compared to 09/17/2023. Stable cardiac size and mediastinal contours. Paucity of bowel gas now.  Stable visualized osseous structures. IMPRESSION: 1. Stable lines and tubes. 2. Stable ventilation since yesterday, improved since 09/17/2023. Stable low lung volumes and patchy perihilar and lung base opacity. Electronically Signed   By: Marlise Simpers M.D.   On: 09/19/2023 08:02   DG Abd 1 View Result Date: 09/18/2023 CLINICAL DATA:  Check gastric catheter placement EXAM: ABDOMEN - 1 VIEW COMPARISON:  09/17/2023 FINDINGS: Gastric  catheter is noted coiled within the stomach. No free air is seen. Postsurgical changes in the lower lumbar spine are noted. No obstructive pattern is seen. IMPRESSION: Gastric catheter in the stomach. Electronically Signed   By: Violeta Grey M.D.   On: 09/18/2023 11:18   DG Chest Port 1 View Result Date: 09/18/2023 CLINICAL DATA:  Shortness of breath and chest pain EXAM: PORTABLE CHEST 1 VIEW COMPARISON:  09/17/2023 FINDINGS: Cardiac shadow is stable. Defibrillator is again noted and stable. Endotracheal 2 and gastric catheter are seen. Lungs are well aerated  bilaterally. Improved aeration is noted with decrease in edematous changes. Some basilar opacities are again seen bilaterally. IMPRESSION: Improved aeration when compared with the prior exam. Some persistent basilar opacities remain. Electronically Signed   By: Violeta Grey M.D.   On: 09/18/2023 11:09     Echo moderate to severely depressed left ventricular function EF around 30%  TELEMETRY: Sinus rhythm rate of 70 nonspecific ST-T wave changes:  ASSESSMENT AND PLAN:  Principal Problem:   Acute respiratory failure with hypoxia (HCC) Active Problems:   Sepsis (HCC)   Altered mental status   HFrEF (heart failure with reduced ejection fraction) (HCC)   Non-ST elevation MI (NSTEMI) (HCC)   Aspiration pneumonia of both lower lobes (HCC)    Plan Acute respiratory failure secondary to RSV infection continue current supportive management and care Altered mental status intubated sedated continue current therapy Non-STEMI continue medical therapy continue anticoagulation Agree with echocardiogram for assessment of left ventricular function with elevated troponins Aspiration pneumonia lower lobes agree with broad-spectrum antibiotic therapy Multivessel coronary disease chronic stable continue medical therapy consider aggressive therapy once patient is extubated and recovered AICD in place no discharges Obstructive sleep apnea continue respiratory support Toxic metabolic encephalopathy history of frontotemporal dementia continue supportive therapy consider neurology input Circulatory shock/severe sepsis continue current therapy Continue to follow with no invasive cardiac procedures planned  Antonette Batters, MD, 09/19/2023 11:36 AM

## 2023-09-19 NOTE — Progress Notes (Signed)
 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 [employer] [id number] DEFIBRILLATOR, RESONATE EL DR DF4 S/N: [id number] implanted 11/21/2021), and OSA on CPAP who presented to the ED with chief complaints of altered mental status.   Per ed reports, and patient's wife who is at the bedside, Patient was seen at the urgent care for chief complaints of chills, body aches, intermittently and nonproductive coughing fits. He was diagnosed with acute upper respiratory infection and was sent home with promethazine-dextromethorphan and cefdinir (OMNICEF). [name] report that he took the prescription and went to bed. Later he woke up altered and delusional and c/o CP and SOB. He was noted to be sweating profusely with abnormal body temp per wife.   ED Course: Initial vital signs showed HR >180 beats/minute, BP >160 mm Hg, the RR 30s-40s breaths/minute, and the oxygen saturation 80s% on NRB and a temperature of 103.23F (39.6C).    Pertinent Labs/Diagnostics Findings: Na+/ K+:138/3.9  Glucose:127 CO2 21 WBC: unremarkable Lactic acid: 1.5~2.6 CK: COVID PCR: Negative troponin:106~7453   BNP:573.5  VBG: pO2 pend; pCO2 37; pH 7.49;  HCO3 28.2, %O2 Sat 43.7.  CXR> CTH> CTA Chest> CT Abd/pelvis>see report   Patient's agitation and restlessness worsen with HR up in the 170s to 180s.  2 mg of Ativan  was trialed but patient did not  calm down. Given worsening symptoms and high risk for decompensation, patient intubated for airway protection. He was started on broad-spectrum antibiotics Ampicillin , vanc, Ceftriaxone  and Acyclovir  due to unclear source for possible infection and cover broadly to include treatment for meningitis. PCCM consulted for admission.  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 and fell off, then he got back onto the horse and fell again on asphalt), HFrEF, ischemic cardiomyopathy, Hyperlipidemia, SVT, CAD, Depression, GERD, Hearing loss of both ears, MI, CVA, Hypertension, Hypotestosteronemia, ICD in place [facility] [employer] [id number] DEFIBRILLATOR, RESONATE EL DR DF4 S/N: [id number] implanted 11/21/2021), and OSA on CPAP     Significant Hospital Events: Including procedures, antibiotic start and stop dates in addition to other pertinent events   5/26: Admitted to ICU with acute altered mental status and respiratory failure in the setting of suspected NMS versus sepsis requiring intubation 5/27: fever improved, continued on vasopressors 5/28: extubated in the afternoon. Respiratory failure overnight, re-intubated 5/29: vented, increased oxygen requirements. Family at the bedside. Heart failure consult placed 5/30: vent requirements improved, diuresing, sedated. Reina Cara with SBT 5/31: remains vented, WUA this AM   Interim History / Subjective:  Remains vented and sedated, withdraws to pain  Objective    Blood pressure 122/74, pulse 76, temperature 98 F (36.7 C), temperature source Bladder, resp. rate 16, height 5' 5.98" (1.676 m), weight 86.7 kg, SpO2 98%. CVP:  [11 mmHg-23 mmHg] 14 mmHg  Vent Mode: PRVC FiO2 (%):  [50 %-60 %] 50 % Set  Rate:  [15 bmp] 15 bmp Vt Set:  [500 mL] 500 mL PEEP:  [8 cmH20] 8 cmH20 Pressure Support:  [5 cmH20] 5  cmH20 Plateau Pressure:  [14 cmH20-17 cmH20] 17 cmH20   Intake/Output Summary (Last 24 hours) at 09/19/2023 0923 Last data filed at 09/19/2023 0553 Gross per 24 hour  Intake 3389.19 ml  Output 3175 ml  Net 214.19 ml   Filed Weights   09/17/23 0500 09/18/23 0500 09/19/23 0500  Weight: 91.1 kg 86.7 kg 86.7 kg    Examination:  Physical Exam Constitutional:      General: He is not in acute distress.    Appearance: He is ill-appearing.  Cardiovascular:     Rate and Rhythm: Normal rate and regular rhythm.     Pulses: Normal pulses.     Heart sounds: Normal heart sounds.  Pulmonary:     Breath sounds: No wheezing or rales.     Comments: Ventilated breath sounds Neurological:     Mental Status: He is disoriented.     Assessment and Plan   73 year old male presenting with a few days' history of shortness of breath, viral symptoms, and encephalopathy. He was intubated on presentation to the ED due to agitation for airway protection and admitted to the ICU for further management.  #Acute Hypoxic Respiratory Failure #Toxic Metabolic Encephalopathy  #Fronto-temporal Dementia #Rhinovirus Infection #Aspiration Pneumonia  #Community Acquired Pneumonia #Acute on Chronic HFrEF #Type II NSTEMI  #CAD s/p PCI to LAD #Circulatory Shock  #Severe Sepsis #OSA on CPAP #Ventricular Tachycardia   Neuro - Propofol  and PRN fentanyl  for analgosedation, RASS -1, holding home duloxetine , restarted valproic  acid. Report of promethazine-dextromethorphan but wife reports only minimal ingestion (1/4 spoon once). Symptoms unlikely to represent NMS or meningitis. He is on clopidogrel  and the risk of LP outweighs the benefits. Would consider LP after a 5 day washout of clopidogrel . Plan for daily WUA and SBT as tolerated.  CV - history of HFrEF, on GDMT, and CAD with PCI to LAD. Presents with significant elevation in troponin, which represents NSTEMI secondary to shock and critical illness. This could  also be secondary to decompensated heart failure (elevated BNP, interlobular septal thickening). He also developed ventricular tachycardia (5/30) while on SBT for which an amiodarone  gtt was started. Now off vasopressors with normabl BP. Continued on diuresis with advanced heart failure input. Will re-evaluate co-ox today, and decrease furosemide  to 60 mg IV bid.   Pulm - baseline OSA followed by [facility] Pulmonology, on CPAP. Now intubated with respiratory failure secondary to rhinovirus with superimposed bacterial pneumonia (consolidations noted in the bilateral lower lobes). Also noted interlobular septal thickening on my review of the chest CT suggestive of concomitant decompensated heart failure. Extubated 5/29 but failed and re-intubated. Will continue to wean vent settings, diurese, treat pneumonia, and work towards liberation off mechanical ventilation. SBT today.  GI - SUP with PPI, on tube feeds.   Renal - kidney function at baseline and improved. Avoiding nephrotoxins. On IV diuresis with 60 mg furosemide  bid.   Endo - ICU glycemic protocol, on hydrocortisone  for adjunct treatment of severe pneumonia. Decreased hydrocortisone  to 50 bid today.   Hem/Onc - was on heparin  gtt for ACS, discontinued after 48 hours of AC. Enoxaparin  for DVT prophylaxis   ID - positive for rhinovirus, has bilateral consolidations in the lower lobes consistent with CAP. Continue with broad spectrum antibiotics, d/c acyclovir  and ampicillin  given low index of suspicion for meningitis. Will consider LP if remains febrile  and with altered mental status. Check CXR today and procalcitonin level.  Best Practice (right click and "Reselect all SmartList Selections" daily)   Diet/type: tubefeeds DVT prophylaxis LMWH Pressure ulcer(s): N/A GI prophylaxis: PPI Lines: Central line and Arterial Line Foley:  Yes, and it is still needed Code Status:  full code Last date of multidisciplinary goals of care discussion  [09/19/2023]  Labs   CBC: Recent Labs  Lab 09/14/23 1835 09/15/23 0244 09/15/23 0612 09/16/23 0359 09/17/23 0523 09/18/23 0430  WBC 9.3 6.6  --  13.3* 9.4 9.3  NEUTROABS 7.5  --   --   --  7.6 7.6  HGB 15.6 13.8  --  12.8* 13.1 12.5*  HCT 45.0 40.3  --  39.2 39.4 36.9*  MCV 91.8 92.9  --  96.1 96.3 95.1  PLT 222 178 172 163 170 173    Basic Metabolic Panel: Recent Labs  Lab 09/15/23 0612 09/15/23 1646 09/16/23 0359 09/17/23 0523 09/17/23 1006 09/17/23 2058 09/18/23 0430 09/18/23 1348 09/19/23 0409  NA 136   < > 140 142 142  --  143 144 143  K 2.8*   < > 4.4 5.3* 4.5 3.5 4.2 3.9 3.8  CL 107   < > 111 110 110  --  106 105 104  CO2 20*   < > 20* 24 24  --  25 29 29   GLUCOSE 145*   < > 136* 147* 122*  --  145* 160* 157*  BUN 17   < > 18 32* 34*  --  38* 43* 46*  CREATININE 1.05   < > 0.88 1.14 1.19  --  1.09 1.03 1.02  CALCIUM  7.7*   < > 6.9* 8.1* 7.7*  --  7.6* 7.2* 7.5*  MG 2.2  --  2.3 2.7*  --  2.3 2.6* 2.3  --   PHOS 2.4*  --  3.0 2.6  --   --  3.4  --  2.5   < > = values in this interval not displayed.   GFR: Estimated Creatinine Clearance: 64.6 mL/min (by C-G formula based on SCr of 1.02 mg/dL). Recent Labs  Lab 09/14/23 1835 09/14/23 2120 09/15/23 0128 09/15/23 0244 09/15/23 2536 09/16/23 0359 09/17/23 0523 09/18/23 0429 09/18/23 0430  PROCALCITON  --   --   --   --  3.43  --   --  2.99  --   WBC 9.3  --   --  6.6  --  13.3* 9.4  --  9.3  LATICACIDVEN 1.5 2.6* 1.4  --   --   --   --   --   --     Liver Function Tests: Recent Labs  Lab 09/14/23 1835 09/15/23 0308 09/16/23 0359  AST 25 76* 59*  ALT 24 26 26   ALKPHOS 55 40 34*  BILITOT 0.9 0.8 0.5  PROT 7.2 5.8* 5.5*  ALBUMIN 4.0 3.1* 2.8*   No results for input(s): "LIPASE", "AMYLASE" in the last 168 hours. No results for input(s): "AMMONIA" in the last 168 hours.  ABG    Component Value Date/Time   PHART 7.36 09/18/2023 0500   PCO2ART 50 (H) 09/18/2023 0500   PO2ART 166 (H)  09/18/2023 0500   HCO3 28.2 (H) 09/18/2023 0500   ACIDBASEDEF 0.2 09/17/2023 0834   O2SAT 91.4 09/18/2023 0751     Coagulation Profile: Recent Labs  Lab 09/15/23 0244  INR 1.2    Cardiac Enzymes: Recent Labs  Lab 09/14/23 1835 09/15/23 0308 09/17/23  0523  CKTOTAL 136 612* 198    HbA1C: No results found for: "HGBA1C"  CBG: Recent Labs  Lab 09/18/23 0743 09/18/23 1151 09/18/23 1526 09/18/23 1938 09/19/23 0756  GLUCAP 125* 109* 168* 146* 120*    Review of Systems:   N/A  Past Medical History:  He,  has a past medical history of 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), Asperger's syndrome, 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), CAD (coronary artery disease) (09/21/2013), 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), Chronic left shoulder pain (04/03/2016), 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), Cognitive deficit as late effect of traumatic brain injury (HCC) (03/06/2016), Coronary artery disease, Dementia (HCC), Depression, Disorder of male genital organs (07/11/2013), Dyspnea, Encounter for long-term (current) use of medications (11/21/2014), Erectile dysfunction (07/11/2013), Executive function deficit (03/06/2016), GERD (gastroesophageal reflux disease), Headache, Hearing loss, Hyperlipidemia, Hypertension, Hypogonadism, male (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   Surgical History:   Past Surgical History:  Procedure Laterality Date   BACK SURGERY  2011   rods  and screws   CLOSED REDUCTION NASAL FRACTURE Bilateral 06/11/2018   Procedure: CLOSED REDUCTION NASAL FRACTURE;  Surgeon: [name], MD;  Location: [facility] ORS;  Service: ENT;  Laterality: Bilateral;   COLONOSCOPY  2015   CORONARY ANGIOPLASTY     2015 stent   CORONARY STENT INTERVENTION N/A 06/08/2019   Procedure: CORONARY STENT INTERVENTION;  Surgeon: Antonette Batters, MD;  Location: [facility] INVASIVE CV LAB;  Service: Cardiovascular;  Laterality: N/A;   CYSTOSCOPY  1984   EYE SURGERY Bilateral 2005,2006,2009   detached retina, cataract   FOOT ARTHRODESIS Left 05/30/2015   Procedure: ARTHRODESIS FOOT STJ LT FOOT;  Surgeon: Anell Baptist, DPM;  Location: [facility];  Service: Podiatry;  Laterality: Left;  WITH POPLITEAL BLOCK   FOOT SURGERY Left    HERNIA REPAIR Right 1969   inguinal   LEFT HEART CATH AND CORONARY ANGIOGRAPHY Left 06/08/2019   Procedure: LEFT HEART CATH AND CORONARY ANGIOGRAPHY;  Surgeon: Antonette Batters, MD;  Location: [facility] INV[facility] CV LAB;  Service: Cardiovascular;  Laterality: Left;   SEPTOPLASTY Bilateral 06/11/2018   Procedure: SEPTOPLASTY;  Surgeon: [name], MD;  Location: [facility] ORS;  Service: ENT;  Laterality: Bilateral;   SHOULDER SURGERY Right 2004   skull surgery     TOE SURGERY Right 2009   TONSILLECTOMY  2008     Social History:   reports that he has never smoked. He has never used smokeless tobacco. He reports that he does not drink alcohol and does not use drugs.   Family History:  His family history includes Asperger's syndrome in his mother.   Allergies Allergies  Allergen Reactions   Promethazine Other (See Comments)    Severe neuroleptic malignant syndrome requiring intubation   Mirtazapine Other (See Comments)    Other reaction(s): Other (See Comments) Irritability/anger, increased appetite, worsening depression Irritability/anger, increased appetite, worsening depression      Home Medications  Prior to Admission medications    Medication Sig Start Date End Date Taking? Authorizing Provider  acetaminophen  (TYLENOL ) 500 MG tablet Take 1,000 mg by mouth every 6 (six) hours as needed for mild pain or moderate pain.   Yes [provider]  aspirin  EC  81 MG tablet Take 81 mg by mouth daily.   Yes [provider]  B Complex-C (SUPER B COMPLEX PO) Take 1 tablet by mouth daily.   Yes [provider]  Calcium  Carbonate-Vitamin D  600-200 MG-UNIT CAPS Take 1 tablet by mouth 2 (two) times daily.    Yes [provider]  carvedilol  (COREG ) 3.125 MG tablet Take 3.125 mg by mouth daily.  11/03/18  Yes [provider]  cefdinir (OMNICEF) 300 MG capsule Take 300 mg by mouth 2 (two) times daily. 09/14/23 09/21/23 Yes [provider]  clindamycin  (CLEOCIN  T) 1 % lotion Apply 1 application topically 2 (two) times daily as needed.    Yes [provider]  clopidogrel  (PLAVIX ) 75 MG tablet Take 75 mg by mouth daily. 09/27/18  Yes [provider]  divalproex  (DEPAKOTE  ER) 250 MG 24 hr tablet Total of 750 mg daily. Take along with 500 mg tab 08/24/23  Yes Hisada, Ivan Marion, MD  divalproex  (DEPAKOTE  ER) 500 MG 24 hr tablet Take 1 tablet (500 mg total) by mouth daily. 08/13/23 10/12/23 Yes Todd Fossa, MD  DULoxetine  (CYMBALTA ) 60 MG capsule Take 60 mg by mouth 2 (two) times daily.  11/17/18  Yes [provider]  FIBER PO Take 1 tablet by mouth 2 (two) times daily.    Yes [provider]  furosemide  (LASIX ) 20 MG tablet Take 20 mg by mouth daily. 11/03/18  Yes [provider]  latanoprost (XALATAN) 0.005 % ophthalmic solution Place 1 drop into the left eye daily. 11/06/21  Yes [provider]  losartan (COZAAR) 100 MG tablet Take 100 mg by mouth daily.   Yes [provider]  Melatonin 5 MG TABS Take 10 mg by mouth at bedtime. 30 min before bed   Yes [provider]  omeprazole (PRILOSEC) 20 MG capsule Take 20 mg by mouth daily.   Yes  [provider]  Probiotic Product (PROBIOTIC DAILY PO) Take 1 tablet by mouth daily. Senior   Yes [provider]  silodosin  (RAPAFLO ) 8 MG CAPS capsule TAKE 1 CAPSULE BY MOUTH DAILY WITH BREAKFAST Patient taking differently: Take 8 mg by mouth daily. 03/16/23  Yes Stoioff, Kizzie Perks, MD  sodium chloride  (OCEAN) 0.65 % SOLN nasal spray Place 2 sprays into both nostrils as needed for congestion.   Yes [provider]  tadalafil  (CIALIS ) 5 MG tablet Take 1 tablet by mouth once daily 08/03/23  Yes Stoioff, Scott C, MD  SYRINGE-NEEDLE, DISP, 3 ML (B-D 3CC LUER-LOK SYR 22GX1-1/2) 22G X 1-1/2" 3 ML MISC USE AS DIRECTED WITH TESTOSTERONE  08/24/23   Stoioff, Kizzie Perks, MD  testosterone  cypionate (DEPOTESTOSTERONE CYPIONATE) 200 MG/ML injection Inject 0.3 mLs (60 mg total) into the muscle once a week. DISCARD REMAINING AS THESE ARE SINGLE USE VIALS. Patient taking differently: Inject 60 mg into the muscle every 14 (fourteen) days. EVERY OTHER SATURDAY. DISCARD REMAINING AS THESE ARE SINGLE USE VIALS. 06/16/23   Geraline Knapp, MD     Critical care time: 43 minutes    Vergia Glasgow, MD [facility] 09/19/2023 9:26 AM

## 2023-09-20 ENCOUNTER — Other Ambulatory Visit: Payer: Self-pay | Admitting: Psychiatry

## 2023-09-20 ENCOUNTER — Encounter: Payer: Self-pay | Admitting: Student in an Organized Health Care Education/Training Program

## 2023-09-20 ENCOUNTER — Inpatient Hospital Stay

## 2023-09-20 DIAGNOSIS — I5023 Acute on chronic systolic (congestive) heart failure: Secondary | ICD-10-CM | POA: Diagnosis not present

## 2023-09-20 DIAGNOSIS — J9601 Acute respiratory failure with hypoxia: Secondary | ICD-10-CM | POA: Diagnosis not present

## 2023-09-20 DIAGNOSIS — G928 Other toxic encephalopathy: Secondary | ICD-10-CM | POA: Diagnosis not present

## 2023-09-20 DIAGNOSIS — J690 Pneumonitis due to inhalation of food and vomit: Secondary | ICD-10-CM | POA: Diagnosis not present

## 2023-09-20 LAB — BASIC METABOLIC PANEL WITH GFR
Anion gap: 11 (ref 5–15)
BUN: 42 mg/dL — ABNORMAL HIGH (ref 8–23)
CO2: 35 mmol/L — ABNORMAL HIGH (ref 22–32)
Calcium: 7.6 mg/dL — ABNORMAL LOW (ref 8.9–10.3)
Chloride: 103 mmol/L (ref 98–111)
Creatinine, Ser: 0.89 mg/dL (ref 0.61–1.24)
GFR, Estimated: >60 mL/min (ref >60)
Glucose, Bld: 164 mg/dL — ABNORMAL HIGH (ref 70–99)
Potassium: 3.3 mmol/L — ABNORMAL LOW (ref 3.5–5.1)
Sodium: 149 mmol/L — ABNORMAL HIGH (ref 135–145)

## 2023-09-20 LAB — COOXEMETRY PANEL
Carboxyhemoglobin: 1.8 % — ABNORMAL HIGH (ref 0.5–1.5)
Carboxyhemoglobin: 1.6 % — ABNORMAL HIGH (ref 0.5–1.5)
Methemoglobin: 0.6 % (ref 0.0–1.5)
Methemoglobin: <0.7 % (ref 0.0–1.5)
O2 Saturation: 84.6 %
O2 Saturation: 78.1 %
Total hemoglobin: 15.6 g/dL (ref 12.0–16.0)
Total hemoglobin: 12.7 g/dL (ref 12.0–16.0)
Total oxygen content: 82.5 %
Total oxygen content: 76.5 %

## 2023-09-20 LAB — MAGNESIUM: Magnesium: 2.7 mg/dL — ABNORMAL HIGH (ref 1.7–2.4)

## 2023-09-20 LAB — GLUCOSE, CAPILLARY
Glucose-Capillary: 134 mg/dL — ABNORMAL HIGH (ref 70–99)
Glucose-Capillary: 121 mg/dL — ABNORMAL HIGH (ref 70–99)
Glucose-Capillary: 126 mg/dL — ABNORMAL HIGH (ref 70–99)
Glucose-Capillary: 163 mg/dL — ABNORMAL HIGH (ref 70–99)
Glucose-Capillary: 142 mg/dL — ABNORMAL HIGH (ref 70–99)
Glucose-Capillary: 118 mg/dL — ABNORMAL HIGH (ref 70–99)

## 2023-09-20 LAB — PROCALCITONIN: Procalcitonin: 1.2 ng/mL

## 2023-09-20 LAB — PHOSPHORUS: Phosphorus: 2.2 mg/dL — ABNORMAL LOW (ref 2.5–4.6)

## 2023-09-20 MED ORDER — K PHOS MONO-SOD PHOS DI & MONO 155-852-130 MG PO TABS
500.0000 mg | ORAL_TABLET | ORAL | Status: AC
  Administered 2023-09-20 (×2): 500 mg
  Filled 2023-09-20 (×2): qty 2

## 2023-09-20 MED ORDER — SODIUM CHLORIDE 0.9 % IV SOLN: INTRAVENOUS | Status: AC | PRN

## 2023-09-20 MED ORDER — IPRATROPIUM-ALBUTEROL 0.5-2.5 (3) MG/3ML IN SOLN
3.0000 mL | Freq: Three times a day (TID) | RESPIRATORY_TRACT | Status: DC
  Administered 2023-09-21 – 2023-09-29 (×26): 3 mL via RESPIRATORY_TRACT
  Filled 2023-09-20 (×26): qty 3

## 2023-09-20 MED ORDER — POTASSIUM CHLORIDE 10 MEQ/100ML IV SOLN
10.0000 meq | INTRAVENOUS | Status: AC
  Administered 2023-09-20 (×2): 10 meq via INTRAVENOUS
  Filled 2023-09-20 (×2): qty 100

## 2023-09-20 NOTE — Plan of Care (Signed)
  Problem: Education: Goal: Knowledge of General Education information will improve Description: Including pain rating scale, medication(s)/side effects and non-pharmacologic comfort measures Outcome: Not Progressing   Problem: Health Behavior/Discharge Planning: Goal: Ability to manage health-related needs will improve Outcome: Not Progressing   Problem: Clinical Measurements: Goal: Ability to maintain clinical measurements within normal limits will improve Outcome: Not Progressing Goal: Will remain free from infection Outcome: Not Progressing Goal: Diagnostic test results will improve Outcome: Not Progressing Goal: Respiratory complications will improve Outcome: Not Progressing Goal: Cardiovascular complication will be avoided Outcome: Not Progressing   Problem: Activity: Goal: Risk for activity intolerance will decrease Outcome: Not Progressing   Problem: Nutrition: Goal: Adequate nutrition will be maintained Outcome: Not Progressing   Problem: Coping: Goal: Level of anxiety will decrease Outcome: Not Progressing   Problem: Elimination: Goal: Will not experience complications related to bowel motility Outcome: Not Progressing Goal: Will not experience complications related to urinary retention Outcome: Not Progressing   Problem: Pain Managment: Goal: General experience of comfort will improve and/or be controlled Outcome: Not Progressing   Problem: Safety: Goal: Ability to remain free from injury will improve Outcome: Not Progressing   Problem: Skin Integrity: Goal: Risk for impaired skin integrity will decrease Outcome: Not Progressing   Problem: Activity: Goal: Ability to tolerate increased activity will improve Outcome: Not Progressing   Problem: Respiratory: Goal: Ability to maintain a clear airway and adequate ventilation will improve Outcome: Not Progressing   Problem: Role Relationship: Goal: Method of communication will improve Outcome: Not  Progressing

## 2023-09-20 NOTE — Progress Notes (Signed)
 [facility] Cardiology    SUBJECTIVE: Patient intubated sedated   Vitals:   09/20/23 1000 09/20/23 1030 09/20/23 1100 09/20/23 1102  BP: 125/75 128/75 122/75 122/75  Pulse: 84 89 90 94  Resp: 14 13 14 19   Temp: 99.3 F (37.4 C) 99.7 F (37.6 C) 99.9 F (37.7 C) 99.9 F (37.7 C)  TempSrc:      SpO2: 95% 95% 93% 96%  Weight:      Height:         Intake/Output Summary (Last 24 hours) at 09/20/2023 1221 Last data filed at 09/20/2023 1110 Gross per 24 hour  Intake 2706.76 ml  Output 3780 ml  Net -1073.24 ml      PHYSICAL EXAM  General: Well developed, well nourished, in no acute distress HEENT:  Normocephalic and atramatic Neck:  No JVD.  Lungs: Clear bilaterally to auscultation and percussion. Heart: HRRR . Normal S1 and S2 without gallops or murmurs.  Abdomen: Bowel sounds are positive, abdomen soft and non-tender  Msk:  Back normal, normal gait. Normal strength and tone for age. Extremities: No clubbing, cyanosis or edema.   Neuro: Alert and oriented X 3. Psych:  Good affect, responds appropriately   LABS: Basic Metabolic Panel: Recent Labs    09/18/23 1348 09/19/23 0409 09/20/23 0437  NA 144 143 149*  K 3.9 3.8 3.3*  CL 105 104 103  CO2 29 29 35*  GLUCOSE 160* 157* 164*  BUN 43* 46* 42*  CREATININE 1.03 1.02 0.89  CALCIUM  7.2* 7.5* 7.6*  MG 2.3  --  2.7*  PHOS  --  2.5 2.2*   Liver Function Tests: No results for input(s): "AST", "ALT", "ALKPHOS", "BILITOT", "PROT", "ALBUMIN" in the last 72 hours. No results for input(s): "LIPASE", "AMYLASE" in the last 72 hours. CBC: Recent Labs    09/18/23 0430 09/19/23 0409  WBC 9.3 6.9  NEUTROABS 7.6 5.6  HGB 12.5* 12.3*  HCT 36.9* 38.1*  MCV 95.1 97.4  PLT 173 179   Cardiac Enzymes: No results for input(s): "CKTOTAL", "CKMB", "CKMBINDEX", "TROPONINI" in the last 72 hours. BNP: Invalid input(s): "POCBNP" D-Dimer: No results for input(s): "DDIMER" in the last 72 hours. Hemoglobin A1C: No results for  input(s): "HGBA1C" in the last 72 hours. Fasting Lipid Panel: Recent Labs    09/18/23 0430  TRIG 146   Thyroid Function Tests: No results for input(s): "TSH", "T4TOTAL", "T3FREE", "THYROIDAB" in the last 72 hours.  Invalid input(s): "FREET3" Anemia Panel: No results for input(s): "VITAMINB12", "FOLATE", "FERRITIN", "TIBC", "IRON", "RETICCTPCT" in the last 72 hours.  DG Chest Port 1 View Result Date: 09/20/2023 CLINICAL DATA:  73 year old male respiratory failure, intubated. EXAM: PORTABLE CHEST 1 VIEW COMPARISON:  Portable chest yesterday and earlier. FINDINGS: Portable AP upright view at 0942 hours. Stable lines and tubes. Left chest pacer/resuscitation pads persist. Stable lung volumes and mediastinal contours. Stable left chest AICD. Stable ventilation since yesterday. No pneumothorax, pulmonary edema. Patchy left greater than right lung base opacity. Paucity of bowel gas in the upper abdomen. Stable visualized osseous structures. IMPRESSION: 1. Stable lines and tubes. 2. Stable ventilation since yesterday. Patchy left greater than right lung base opacity. Electronically Signed   By: Marlise Simpers M.D.   On: 09/20/2023 10:26   DG Chest Port 1 View Result Date: 09/19/2023 CLINICAL DATA:  73 year old male with respiratory failure. EXAM: PORTABLE CHEST 1 VIEW COMPARISON:  Portable AP semi upright view at 0751 hours. FINDINGS: Portable AP semi upright view at 0751 hours. Less rotated to the  left now. Pacer and resuscitation pads project over the lower chest. Stable left chest AICD. Endotracheal tube tip remains satisfactory. Stable lines and tubes. Stable low lung volumes, patchy perihilar and lung base opacity. Ventilation is stable with no pneumothorax, pleural effusion identified. And ventilation has improved compared to 09/17/2023. Stable cardiac size and mediastinal contours. Paucity of bowel gas now.  Stable visualized osseous structures. IMPRESSION: 1. Stable lines and tubes. 2. Stable ventilation  since yesterday, improved since 09/17/2023. Stable low lung volumes and patchy perihilar and lung base opacity. Electronically Signed   By: Marlise Simpers M.D.   On: 09/19/2023 08:02     Echo moderate to severely depressed left ventricular function  TELEMETRY: Normal sinus rhythm rate of 60 nonspecific T wave changes:  ASSESSMENT AND PLAN:  Principal Problem:   Acute respiratory failure with hypoxia (HCC) Active Problems:   Sepsis (HCC)   Altered mental status   HFrEF (heart failure with reduced ejection fraction) (HCC)   Non-ST elevation MI (NSTEMI) (HCC)   Aspiration pneumonia of both lower lobes (HCC)    Plan Agree with continued critical care therapy for respiratory failure Continue treatment for RSV infection Wean from vent when appropriate Continue diuretic therapy for congestive heart failure Evidence of non-STEMI elevated troponin depressed LV function Altered mental status unclear etiology possibly related to infection Echocardiogram for left ventricular function wall motion Continue supportive care COVID-19 positive test (U07.1, COVID-19) with Acute Pneumonia (J12.89, Other viral pneumonia) (If respiratory failure or sepsis present, add as separate assessment) No invasive cardiac procedures schedule or recommended at this point    Antonette Batters, MD 09/20/2023 12:21 PM

## 2023-09-20 NOTE — Consult Note (Signed)
 PHARMACY CONSULT NOTE - ELECTROLYTES  Pharmacy Consult for Electrolyte Monitoring and Replacement   Recent Labs: Potassium (mmol/L)  Date Value  09/20/2023 3.3 (L)  05/23/2014 4.1   Magnesium  (mg/dL)  Date Value  16/01/9603 2.7 (H)  09/13/2013 1.9   Calcium  (mg/dL)  Date Value  54/12/8117 7.6 (L)   Calcium , Total (mg/dL)  Date Value  14/78/2956 8.5   Albumin (g/dL)  Date Value  21/30/8657 2.8 (L)  05/23/2014 3.7   Phosphorus (mg/dL)  Date Value  84/69/6295 2.2 (L)   Sodium (mmol/L)  Date Value  09/20/2023 149 (H)  05/23/2014 143   Height: 5' 5.98" (167.6 cm) Weight: 84.6 kg (186 lb 8.2 oz) IBW/kg (Calculated) : 63.76 Estimated Creatinine Clearance: 73.1 mL/min (by C-G formula based on SCr of 0.89 mg/dL).  Assessment  Blake [name] is Blake 73 y.o. male presenting with acute respiratory failure and shock. 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 Pharmacy has been consulted to monitor and replace electrolytes.  Diet: NPO, intubated MIVF: N/Blake Pertinent medications: Lasix  80 mg IV BID(last dose 5/30)  Goal of Therapy: Electrolytes within normal limits  Plan:  K 3.3: Kcl 10mEq IV x 2 Phos 2.2: Kphos 500mg  via tube x 2 Labs tomorrow  Thank you for allowing pharmacy to be Blake part of this patient's care.  Blake [name] Blake [name] 09/20/2023 7:21 AM

## 2023-09-20 NOTE — Progress Notes (Signed)
 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   Per ed reports, and patient's wife who is at the bedside, Patient was seen at the urgent care for chief complaints of chills, body aches, intermittently and nonproductive coughing fits. He was diagnosed with acute upper respiratory infection and was sent home with promethazine-dextromethorphan and cefdinir (OMNICEF). [name] report that he took the prescription and went to bed. Later he woke up altered and delusional and c/o CP and SOB. He was noted to be sweating profusely with abnormal body temp per wife.   ED Course: Initial vital signs showed HR >180 beats/minute, BP >160 mm Hg, the RR 30s-40s breaths/minute, and the oxygen saturation 80s% on NRB and a temperature of 103.29F (39.6C).    Pertinent Labs/Diagnostics Findings: Na+/ K+:138/3.9  Glucose:127 CO2 21 WBC: unremarkable Lactic acid: 1.5~2.6 CK: COVID PCR: Negative troponin:106~7453   BNP:573.5  VBG: pO2 pend; pCO2 37; pH 7.49;  HCO3 28.2, %O2 Sat 43.7.  CXR> CTH> CTA Chest> CT Abd/pelvis>see report   Patient's agitation and restlessness worsen with HR up in the 170s to 180s.  2 mg of Ativan  was trialed but patient did not  calm down. Given worsening symptoms and high risk for decompensation, patient intubated for airway protection. He was started on broad-spectrum antibiotics Ampicillin , vanc, Ceftriaxone  and Acyclovir  due to unclear source for possible infection and cover broadly to include treatment for meningitis. PCCM consulted for admission.  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 and fell off, then he got back onto the horse and fell again on asphalt), HFrEF, ischemic cardiomyopathy, Hyperlipidemia, SVT, CAD, Depression, GERD, Hearing loss of both ears, MI, CVA, Hypertension, Hypotestosteronemia, ICD in place [facility] [employer] [id number] DEFIBRILLATOR, RESONATE EL DR DF4 S/N: [phone] implanted 11/21/2021), and OSA on CPAP     Significant Hospital Events: Including procedures, antibiotic start and stop dates in addition to other pertinent events   5/26: Admitted to ICU with acute altered mental status and respiratory failure in the setting of suspected NMS versus sepsis requiring intubation 5/27: fever improved, continued on vasopressors 5/28: extubated in the afternoon. Respiratory failure overnight, re-intubated 5/29: vented, increased oxygen requirements. Family at the bedside. Heart failure consult placed 5/30: vent requirements improved, diuresing, sedated. Reina Cara with SBT 5/31: remains vented, WUA this AM 6/01: vented, sedated, afebrile  Interim History / Subjective:  Remains vented, arouses to tactile stimuli  Objective    Blood pressure 122/77, pulse 88, temperature 100.2 F (37.9 C), resp. rate 18, height 5' 5.98" (1.676 m), weight 84.6 kg, SpO2 90%. CVP:  [0 mmHg-13 mmHg] 13 mmHg  Vent Mode: PRVC FiO2 (%):  [45 %-50 %] 45 % Set Rate:  [  15 bmp] 15 bmp Vt Set:  [500 mL] 500 mL PEEP:  [5 cmH20] 5 cmH20 Plateau Pressure:  [14 cmH20-19  cmH20] 14 cmH20   Intake/Output Summary (Last 24 hours) at 09/20/2023 1422 Last data filed at 09/20/2023 1110 Gross per 24 hour  Intake 2706.76 ml  Output 3680 ml  Net -973.24 ml   Filed Weights   09/18/23 0500 09/19/23 0500 09/20/23 0500  Weight: 86.7 kg 86.7 kg 84.6 kg    Examination:  Physical Exam Constitutional:      General: He is not in acute distress.    Appearance: He is ill-appearing.  Cardiovascular:     Rate and Rhythm: Normal rate and regular rhythm.     Pulses: Normal pulses.     Heart sounds: Normal heart sounds.  Pulmonary:     Breath sounds: No wheezing or rales.     Comments: Ventilated breath sounds Neurological:     Mental Status: He is disoriented.     Assessment and Plan   73 year old male presenting with a few days' history of shortness of breath, viral symptoms, and encephalopathy. He was intubated on presentation to the ED due to agitation for airway protection and admitted to the ICU for further management.  #Acute Hypoxic Respiratory Failure #Toxic Metabolic Encephalopathy  #Fronto-temporal Dementia #Rhinovirus Infection #Aspiration Pneumonia  #Community Acquired Pneumonia #Acute on Chronic HFrEF #Type II NSTEMI  #CAD s/p PCI to LAD #Circulatory Shock  #Severe Sepsis #OSA on CPAP #Ventricular Tachycardia   Neuro - Propofol  and PRN fentanyl  for analgosedation, RASS -1, holding home duloxetine , restarted valproic  acid. Report of promethazine-dextromethorphan but wife reports only minimal ingestion (1/4 spoon once). Symptoms unlikely to represent NMS or meningitis. He was on clopidogrel  and the risk of LP outweighs the benefits. Would consider LP after a 5 day washout of clopidogrel  if there remains a concern, but at this point this is less likely. Plan for daily WUA and SBT as tolerated.  CV - history of HFrEF, on GDMT, and CAD with PCI to LAD. Presents with significant elevation in troponin, which represents NSTEMI secondary to shock and  critical illness. This could also be secondary to decompensated heart failure (elevated BNP, interlobular septal thickening). He also developed ventricular tachycardia (5/30) while on SBT for which an amiodarone  gtt was started. Now off vasopressors with normabl BP. Held diuretics after this AM's dose given hypernatremia and hypokalemia. Will re-initiate tomorrow.  Pulm - baseline OSA followed by [facility], on CPAP. Now intubated with respiratory failure secondary to rhinovirus with superimposed bacterial pneumonia (consolidations noted in the bilateral lower lobes). Also noted interlobular septal thickening on my review of the chest CT suggestive of concomitant decompensated heart failure. Extubated 5/29 but failed and re-intubated. Will continue to wean vent settings, diurese, treat pneumonia, and work towards liberation off mechanical ventilation. Holding SBT today per wife's request, will retrial tomorrow.  GI - SUP with PPI, on tube feeds.   Renal - kidney function at baseline and improved. Avoiding nephrotoxins. On IV diuresis with 60 mg furosemide  bid which we will hold today and restart tomorrow.   Endo - ICU glycemic protocol, on hydrocortisone  for adjunct treatment of severe pneumonia. Decreased hydrocortisone  to 50 bid.   Hem/Onc - was on heparin  gtt for ACS, discontinued after 48 hours of AC. Enoxaparin  for DVT prophylaxis   ID - positive for rhinovirus, has bilateral consolidations in the lower lobes consistent with CAP. Continue with broad spectrum antibiotics, now broadened to zosyn. Procal is improved.  Best Practice (right click and "Reselect all SmartList Selections" daily)   Diet/type: tubefeeds DVT prophylaxis LMWH Pressure ulcer(s): N/A GI prophylaxis: PPI Lines: Central line and Arterial Line Foley:  Yes, and it is still needed Code Status:  full code Last date of multidisciplinary goals of care discussion [09/20/2023]  Labs   CBC: Recent Labs  Lab 09/14/23 1835  09/15/23 0244 09/15/23 0612 09/16/23 0359 09/17/23 0523 09/18/23 0430 09/19/23 0409  WBC 9.3 6.6  --  13.3* 9.4 9.3 6.9  NEUTROABS 7.5  --   --   --  7.6 7.6 5.6  HGB 15.6 13.8  --  12.8* 13.1 12.5* 12.3*  HCT 45.0 40.3  --  39.2 39.4 36.9* 38.1*  MCV 91.8 92.9  --  96.1 96.3 95.1 97.4  PLT 222 178 172 163 170 173 179    Basic Metabolic Panel: Recent Labs  Lab 09/16/23 0359 09/17/23 0523 09/17/23 1006 09/17/23 2058 09/18/23 0430 09/18/23 1348 09/19/23 0409 09/20/23 0437  NA 140 142 142  --  143 144 143 149*  K 4.4 5.3* 4.5 3.5 4.2 3.9 3.8 3.3*  CL 111 110 110  --  106 105 104 103  CO2 20* 24 24  --  25 29 29  35*  GLUCOSE 136* 147* 122*  --  145* 160* 157* 164*  BUN 18 32* 34*  --  38* 43* 46* 42*  CREATININE 0.88 1.14 1.19  --  1.09 1.03 1.02 0.89  CALCIUM  6.9* 8.1* 7.7*  --  7.6* 7.2* 7.5* 7.6*  MG 2.3 2.7*  --  2.3 2.6* 2.3  --  2.7*  PHOS 3.0 2.6  --   --  3.4  --  2.5 2.2*   GFR: Estimated Creatinine Clearance: 73.1 mL/min (by C-G formula based on SCr of 0.89 mg/dL). Recent Labs  Lab 09/14/23 1835 09/14/23 2120 09/15/23 0128 09/15/23 0244 09/15/23 9604 09/16/23 0359 09/17/23 0523 09/18/23 0429 09/18/23 0430 09/19/23 0409 09/20/23 0437  PROCALCITON  --   --   --   --  3.43  --   --  2.99  --  1.78 1.20  WBC 9.3  --   --    < >  --  13.3* 9.4  --  9.3 6.9  --   LATICACIDVEN 1.5 2.6* 1.4  --   --   --   --   --   --   --   --    < > = values in this interval not displayed.    Liver Function Tests: Recent Labs  Lab 09/14/23 1835 09/15/23 0308 09/16/23 0359  AST 25 76* 59*  ALT 24 26 26   ALKPHOS 55 40 34*  BILITOT 0.9 0.8 0.5  PROT 7.2 5.8* 5.5*  ALBUMIN 4.0 3.1* 2.8*   No results for input(s): "LIPASE", "AMYLASE" in the last 168 hours. No results for input(s): "AMMONIA" in the last 168 hours.  ABG    Component Value Date/Time   PHART 7.44 09/19/2023 0943   PCO2ART 51 (H) 09/19/2023 0943   PO2ART 84 09/19/2023 0943   HCO3 34.6 (H)  09/19/2023 0943   ACIDBASEDEF 0.2 09/17/2023 0834   O2SAT 78.1 09/20/2023 0437     Coagulation Profile: Recent Labs  Lab 09/15/23 0244  INR 1.2    Cardiac Enzymes: Recent Labs  Lab 09/14/23 1835 09/15/23 0308 09/17/23 0523  CKTOTAL 136 612* 198    HbA1C: No results found for: "HGBA1C"  CBG: Recent Labs  Lab 09/19/23 1950 09/19/23 2315 09/20/23  7829 09/20/23 0800 09/20/23 1157  GLUCAP 115* 122* 134* 121* 126*    Review of Systems:   N/A  Past Medical History:  He,  has a past medical history of 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), Asperger's syndrome, 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), CAD (coronary artery disease) (09/21/2013), 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), Chronic left shoulder pain (04/03/2016), 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), Cognitive deficit as late effect of traumatic brain injury (HCC) (03/06/2016), Coronary artery disease, Dementia (HCC), Depression, Disorder of male genital organs (07/11/2013), Dyspnea, Encounter for long-term (current) use of medications (11/21/2014), Erectile dysfunction (07/11/2013), Executive function deficit (03/06/2016), GERD (gastroesophageal reflux disease), Headache, Hearing loss, Hyperlipidemia, Hypertension, Hypogonadism, male (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   Surgical History:   Past Surgical History:  Procedure Laterality Date   BACK SURGERY  2011   rods and screws   CLOSED REDUCTION  NASAL FRACTURE Bilateral 06/11/2018   Procedure: CLOSED REDUCTION NASAL FRACTURE;  Surgeon: [name], MD;  Location: [facility] ORS;  Service: ENT;  Laterality: Bilateral;   COLONOSCOPY  2015   CORONARY ANGIOPLASTY     2015 stent   CORONARY STENT INTERVENTION N/A 06/08/2019   Procedure: CORONARY STENT INTERVENTION;  Surgeon: Antonette Batters, MD;  Location: [facility] INVASIVE CV [facility];  Service: Cardiovascular;  Laterality: N/A;   CYSTOSCOPY  1984   EYE SURGERY Bilateral 2005,2006,2009   detached retina, cataract   FOOT ARTHRODESIS Left 05/30/2015   Procedure: ARTHRODESIS FOOT STJ LT FOOT;  Surgeon: Anell Baptist, DPM;  [facility];  Service: Podiatry;  Laterality: Left;  WITH POPLITEAL BLOCK   FOOT SURGERY Left    HERNIA REPAIR Right 1969   inguinal   LEFT HEART CATH AND CORONARY ANGIOGRAPHY Left 06/08/2019   Procedure: LEFT HEART CATH AND CORONARY ANGIOGRAPHY;  Surgeon: Antonette Batters, MD;  Location: [facility] INVASIVE CV LAB;  Service: Cardiovascular;  Laterality: Left;   SEPTOPLASTY Bilateral 06/11/2018   Procedure: SEPTOPLASTY;  Surgeon: [name], MD;  Location: [facility] ORS;  Service: ENT;  Laterality: Bilateral;   SHOULDER SURGERY Right 2004   skull surgery     TOE SURGERY Right 2009   TONSILLECTOMY  2008     Social History:   reports that he has never smoked. He has never used smokeless tobacco. He reports that he does not drink alcohol and does not use drugs.   Family History:  His family history includes Asperger's syndrome in his mother.   Allergies Allergies  Allergen Reactions   Promethazine Other (See Comments)    Severe neuroleptic malignant syndrome requiring intubation   Mirtazapine Other (See Comments)    Other reaction(s): Other (See Comments) Irritability/anger, increased appetite, worsening depression Irritability/anger, increased appetite, worsening depression      Home Medications  Prior to Admission medications   Medication Sig Start Date End  Date Taking? Authorizing Provider  acetaminophen  (TYLENOL ) 500 MG tablet Take 1,000 mg by mouth every 6 (six) hours as needed for mild pain or moderate pain.   Yes [provider]  aspirin  EC 81 MG tablet Take 81 mg by mouth daily.   Yes [provider]  B Complex-C (SUPER B COMPLEX PO) Take 1 tablet by  mouth daily.   Yes [provider]  Calcium  Carbonate-Vitamin D  600-200 MG-UNIT CAPS Take 1 tablet by mouth 2 (two) times daily.    Yes [provider]  carvedilol  (COREG ) 3.125 MG tablet Take 3.125 mg by mouth daily.  11/03/18  Yes [provider]  cefdinir (OMNICEF) 300 MG capsule Take 300 mg by mouth 2 (two) times daily. 09/14/23 09/21/23 Yes [provider]  clindamycin  (CLEOCIN  T) 1 % lotion Apply 1 application topically 2 (two) times daily as needed.    Yes [provider]  clopidogrel  (PLAVIX ) 75 MG tablet Take 75 mg by mouth daily. 09/27/18  Yes [provider]  divalproex  (DEPAKOTE  ER) 250 MG 24 hr tablet Total of 750 mg daily. Take along with 500 mg tab 08/24/23  Yes Hisada, Ivan Marion, MD  divalproex  (DEPAKOTE  ER) 500 MG 24 hr tablet Take 1 tablet (500 mg total) by mouth daily. 08/13/23 10/12/23 Yes Todd Fossa, MD  DULoxetine  (CYMBALTA ) 60 MG capsule Take 60 mg by mouth 2 (two) times daily.  11/17/18  Yes [provider]  FIBER PO Take 1 tablet by mouth 2 (two) times daily.    Yes [provider]  furosemide  (LASIX ) 20 MG tablet Take 20 mg by mouth daily. 11/03/18  Yes [provider]  latanoprost (XALATAN) 0.005 % ophthalmic solution Place 1 drop into the left eye daily. 11/06/21  Yes [provider]  losartan (COZAAR) 100 MG tablet Take 100 mg by mouth daily.   Yes [provider]  Melatonin 5 MG TABS Take 10 mg by mouth at bedtime. 30 min before bed   Yes [provider]  omeprazole (PRILOSEC) 20 MG capsule Take 20 mg by mouth daily.   Yes [provider]  Probiotic  Product (PROBIOTIC DAILY PO) Take 1 tablet by mouth daily. Senior   Yes [provider]  silodosin  (RAPAFLO ) 8 MG CAPS capsule TAKE 1 CAPSULE BY MOUTH DAILY WITH BREAKFAST Patient taking differently: Take 8 mg by mouth daily. 03/16/23  Yes Stoioff, Kizzie Perks, MD  sodium chloride  (OCEAN) 0.65 % SOLN nasal spray Place 2 sprays into both nostrils as needed for congestion.   Yes [provider]  tadalafil  (CIALIS ) 5 MG tablet Take 1 tablet by mouth once daily 08/03/23  Yes Stoioff, Scott C, MD  SYRINGE-NEEDLE, DISP, 3 ML (B-D 3CC LUER-LOK SYR 22GX1-1/2) 22G X 1-1/2" 3 ML MISC USE AS DIRECTED WITH TESTOSTERONE  08/24/23   Stoioff, Kizzie Perks, MD  testosterone  cypionate (DEPOTESTOSTERONE CYPIONATE) 200 MG/ML injection Inject 0.3 mLs (60 mg total) into the muscle once a week. DISCARD REMAINING AS THESE ARE SINGLE USE VIALS. Patient taking differently: Inject 60 mg into the muscle every 14 (fourteen) days. EVERY OTHER SATURDAY. DISCARD REMAINING AS THESE ARE SINGLE USE VIALS. 06/16/23   Geraline Knapp, MD     Critical care time: 46 minutes    Vergia Glasgow, MD [facility] 09/20/2023 4:45 PM

## 2023-09-21 DIAGNOSIS — J9602 Acute respiratory failure with hypercapnia: Secondary | ICD-10-CM | POA: Diagnosis not present

## 2023-09-21 DIAGNOSIS — I5021 Acute systolic (congestive) heart failure: Secondary | ICD-10-CM | POA: Diagnosis not present

## 2023-09-21 DIAGNOSIS — R6521 Severe sepsis with septic shock: Secondary | ICD-10-CM

## 2023-09-21 DIAGNOSIS — J9601 Acute respiratory failure with hypoxia: Secondary | ICD-10-CM | POA: Diagnosis not present

## 2023-09-21 DIAGNOSIS — A419 Sepsis, unspecified organism: Secondary | ICD-10-CM | POA: Diagnosis not present

## 2023-09-21 LAB — BASIC METABOLIC PANEL WITH GFR
Anion gap: 10 (ref 5–15)
BUN: 42 mg/dL — ABNORMAL HIGH (ref 8–23)
CO2: 38 mmol/L — ABNORMAL HIGH (ref 22–32)
Calcium: 7.8 mg/dL — ABNORMAL LOW (ref 8.9–10.3)
Chloride: 98 mmol/L (ref 98–111)
Creatinine, Ser: 0.88 mg/dL (ref 0.61–1.24)
GFR, Estimated: >60 mL/min (ref >60)
Glucose, Bld: 159 mg/dL — ABNORMAL HIGH (ref 70–99)
Potassium: 3.3 mmol/L — ABNORMAL LOW (ref 3.5–5.1)
Sodium: 146 mmol/L — ABNORMAL HIGH (ref 135–145)

## 2023-09-21 LAB — COOXEMETRY PANEL
Carboxyhemoglobin: 1.5 % (ref 0.5–1.5)
Methemoglobin: <0.7 % (ref 0.0–1.5)
O2 Saturation: 73.7 %
Total hemoglobin: 11 g/dL — ABNORMAL LOW (ref 12.0–16.0)
Total oxygen content: 72.2 %

## 2023-09-21 LAB — GLUCOSE, CAPILLARY
Glucose-Capillary: 150 mg/dL — ABNORMAL HIGH (ref 70–99)
Glucose-Capillary: 125 mg/dL — ABNORMAL HIGH (ref 70–99)
Glucose-Capillary: 110 mg/dL — ABNORMAL HIGH (ref 70–99)
Glucose-Capillary: 108 mg/dL — ABNORMAL HIGH (ref 70–99)
Glucose-Capillary: 82 mg/dL (ref 70–99)
Glucose-Capillary: 142 mg/dL — ABNORMAL HIGH (ref 70–99)

## 2023-09-21 LAB — CBC
HCT: 36.1 % — ABNORMAL LOW (ref 39.0–52.0)
Hemoglobin: 11.8 g/dL — ABNORMAL LOW (ref 13.0–17.0)
MCH: 31.8 pg (ref 26.0–34.0)
MCHC: 32.7 g/dL (ref 30.0–36.0)
MCV: 97.3 fL (ref 80.0–100.0)
Platelets: 235 10*3/uL (ref 150–400)
RBC: 3.71 MIL/uL — ABNORMAL LOW (ref 4.22–5.81)
RDW: 14.2 % (ref 11.5–15.5)
WBC: 6.4 10*3/uL (ref 4.0–10.5)
nRBC: 0 % (ref 0.0–0.2)

## 2023-09-21 LAB — PROTIME-INR
INR: 1.1 (ref 0.8–1.2)
Prothrombin Time: 14 s (ref 11.4–15.2)

## 2023-09-21 LAB — PHOSPHORUS: Phosphorus: 3.4 mg/dL (ref 2.5–4.6)

## 2023-09-21 LAB — TRIGLYCERIDES: Triglycerides: 190 mg/dL — ABNORMAL HIGH (ref <150)

## 2023-09-21 LAB — MAGNESIUM: Magnesium: 2.7 mg/dL — ABNORMAL HIGH (ref 1.7–2.4)

## 2023-09-21 MED ORDER — POTASSIUM CHLORIDE CRYS ER 20 MEQ PO TBCR: 40.0000 meq | EXTENDED_RELEASE_TABLET | Freq: Once | ORAL | Status: DC

## 2023-09-21 MED ORDER — POTASSIUM CHLORIDE 20 MEQ PO PACK
40.0000 meq | PACK | Freq: Once | ORAL | Status: AC
  Administered 2023-09-21: 40 meq
  Filled 2023-09-21: qty 2

## 2023-09-21 MED ORDER — QUETIAPINE FUMARATE 25 MG PO TABS
25.0000 mg | ORAL_TABLET | Freq: Two times a day (BID) | ORAL | Status: DC
  Administered 2023-09-21 – 2023-09-25 (×10): 25 mg
  Filled 2023-09-21 (×10): qty 1

## 2023-09-21 MED ORDER — AMIODARONE HCL 200 MG PO TABS: 400.0000 mg | ORAL_TABLET | Freq: Every day | ORAL | Status: DC

## 2023-09-21 MED ORDER — POTASSIUM CHLORIDE 20 MEQ PO PACK: 40.0000 meq | PACK | ORAL | Status: DC

## 2023-09-21 MED ORDER — AMIODARONE HCL 200 MG PO TABS
400.0000 mg | ORAL_TABLET | Freq: Every day | ORAL | Status: DC
  Administered 2023-09-21 – 2023-09-29 (×9): 400 mg
  Filled 2023-09-21 (×9): qty 2

## 2023-09-21 NOTE — Progress Notes (Signed)
 No urine output since removing foley at 1120 am. Bladder scanned pt yield 230 cc. Dana Np notified. Received verbal order for I&O cath. Will continue to monitor.

## 2023-09-21 NOTE — Progress Notes (Signed)
 Spoke to [place] patients wife to inform of the message from Dr Edda Goo she voiced understanding.

## 2023-09-21 NOTE — Plan of Care (Signed)
  Problem: Clinical Measurements: Goal: Respiratory complications will improve Outcome: Progressing Goal: Cardiovascular complication will be avoided Outcome: Progressing   Problem: Activity: Goal: Risk for activity intolerance will decrease Outcome: Progressing   Problem: Nutrition: Goal: Adequate nutrition will be maintained Outcome: Progressing   Problem: Elimination: Goal: Will not experience complications related to bowel motility Outcome: Progressing Goal: Will not experience complications related to urinary retention Outcome: Progressing   Problem: Pain Managment: Goal: General experience of comfort will improve and/or be controlled Outcome: Progressing

## 2023-09-21 NOTE — IPAL (Signed)
  Interdisciplinary Goals of Care Family Meeting   Date carried out: 09/21/2023  Location of the meeting: Conference room  Member's involved: Physician, Bedside Registered Nurse, and Family Member or next of kin    GOALS OF CARE DISCUSSION  The Clinical status was relayed to family in detail- Wife Blake [name]  Updated and notified of patients medical condition- Explained to family course of therapy and the modalities  Patient with Progressive multiorgan failure with a very high probablity of a very minimal chance of meaningful recovery despite all aggressive and optimal medical therapy.   PATIENT REMAINS FULL CODE  Family understands the situation. Patient with recurrent choking episodes prior to admission Patient with previous [facility] from TBI Patient with failure to wean from vent Oxygen requirements at 45%  I have discussed that patient has been on vent for 7 days and has failed trial of extubation, best course of action is to proceed with [facility] and FEEDINGS tubes.  Family are satisfied with Plan of action and management. All questions answered  Additional CC time 45 mins   Blake [name], M.D.  [facility]  Medical Director [facility] Medical Director [facility] Cardio-Pulmonary Department

## 2023-09-21 NOTE — Progress Notes (Signed)
 [facility] CLINIC CARDIOLOGY PROGRESS NOTE       Patient ID: Blake [name] MRN: [id number] DOB/AGE: 05-16-1950 73 y.o.  Admit date: 09/14/2023 Referring Physician Dr. Vergia Glasgow Primary Physician Rory Collard, MD Primary Cardiologist Dr. Beau Bound Reason for Consultation Elevated trops, NSTEMI  HPI: Blake [name] is a 73 y.o. male  with a past medical history of coronary artery disease s/p stent to LAD, ischemic cardiomyopathy s/p ICD ([employer]), chronic HFrEF, OSA (on CPAP), hx CVA who presented to the ED on 09/14/2023 for dyspnea, chest pain and AMS. Patient recently seen at urgent care for nonproductive cough, chills/diaphoresis Troponins found to be elevated.  Cardiology was consulted for further evaluation.   Interval History: -Patient seen and examined this AM. Patient intubated. Off levo gtt as of 05/30 with borderline BP and stable HR.  -Overnight Tele showed no significant events. No further VT. -Patient appears euvolemic.  -GOC discussed with family. Patient remains full code. Pulmonology will proceed with trach and feeding tubes.   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   Coronary artery disease    Dementia (HCC)    Depression    Disorder of male genital organs 07/11/2013   Dyspnea    Encounter for long-term (current) use of medications 11/21/2014   Erectile dysfunction 07/11/2013   Executive function deficit 03/06/2016   GERD (gastroesophageal reflux disease)    Headache    stress   Hearing loss     Hyperlipidemia    Hypertension    Hypogonadism, 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   Major depression, recurrent, chronic (HCC) 03/06/2016   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   Mixed hyperlipidemia 02/02/2014   Myocardial infarction (HCC) 08/2013   OCD (obsessive compulsive disorder)    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   Other specified disorder of male genital organs(608.89) 07/11/2013   Prostatic hypertrophy    Pseudophakia of both eyes 04/20/2015   Raynaud's disease    Reduced libido 07/11/2013   Repeated falls    weak left ankle   Scoliosis    Stroke (HCC) 2/16   "light"   Synovitis    knees, ankles    Past Surgical History:  Procedure Laterality Date   BACK SURGERY  2011   rods and screws   CLOSED REDUCTION NASAL FRACTURE Bilateral 06/11/2018   Procedure: CLOSED REDUCTION NASAL FRACTURE;  Surgeon: [name], MD;  Location: [facility] ORS;  Service: ENT;  Laterality: Bilateral;   COLONOSCOPY  2015   CORONARY ANGIOPLASTY     2015 stent   CORONARY STENT INTERVENTION N/A 06/08/2019   Procedure: CORONARY STENT INTERVENTION;  Surgeon: Antonette Batters, MD;  Location: [facility] INVASIVE CV LAB;  Service: Cardiovascular;  Laterality: N/A;   CYSTOSCOPY  1984   EYE SURGERY Bilateral 2005,2006,2009   detached retina, cataract   FOOT ARTHRODESIS Left 05/30/2015   Procedure: ARTHRODESIS FOOT STJ LT FOOT;  Surgeon: Anell Baptist, DPM;  Location: [facility];  Service: Podiatry;  Laterality: Left;  WITH POPLITEAL BLOCK   FOOT SURGERY Left    HERNIA REPAIR Right 1969   inguinal   LEFT HEART CATH AND CORONARY ANGIOGRAPHY Left 06/08/2019   Procedure: LEFT HEART CATH AND CORONARY ANGIOGRAPHY;  Surgeon: Antonette Batters, MD;  Location: [facility] INVASIVE CV LAB;  Service: Cardiovascular;  Laterality: Left;   SEPTOPLASTY Bilateral 06/11/2018   Procedure: SEPTOPLASTY;  Surgeon: [name],  MD;  Location: [facility] ORS;  Service: ENT;  Laterality: Bilateral;   SHOULDER SURGERY Right 2004   skull surgery     TOE SURGERY Right 2009   TONSILLECTOMY  2008    Medications Prior to Admission  Medication Sig Dispense Refill Last Dose/Taking   acetaminophen  (TYLENOL ) 500 MG tablet Take 1,000 mg by mouth every 6 (six) hours as needed for mild pain or moderate pain.   Unknown   aspirin  EC 81 MG tablet Take 81 mg by mouth daily.   09/14/2023 at  6:00 AM   B Complex-C (SUPER B COMPLEX PO) Take 1 tablet by mouth daily.   09/14/2023 Morning   Calcium  Carbonate-Vitamin D  600-200 MG-UNIT CAPS Take 1 tablet by mouth 2 (two) times daily.    09/14/2023 Morning   carvedilol  (COREG ) 3.125 MG tablet Take 3.125 mg by mouth daily.    09/14/2023 Morning   cefdinir (OMNICEF) 300 MG capsule Take 300 mg by mouth 2 (two) times daily.   09/14/2023 Noon   clindamycin  (CLEOCIN  T) 1 % lotion Apply 1 application topically 2 (two) times daily as needed.    Unknown   clopidogrel  (PLAVIX ) 75 MG tablet Take 75 mg by mouth daily.   09/14/2023 at  6:00 AM   divalproex  (DEPAKOTE  ER) 250 MG 24 hr tablet Total of 750 mg daily. Take along with 500 mg tab 30 tablet 0 09/14/2023 at  8:00 AM   divalproex  (DEPAKOTE  ER) 500 MG 24 hr tablet Take 1 tablet (500 mg total) by mouth daily. 30 tablet 1 09/14/2023 at  8:00 AM   DULoxetine  (CYMBALTA ) 60 MG capsule Take 60 mg by mouth 2 (two) times daily.    09/14/2023 at  3:30 AM   FIBER PO Take 1 tablet by mouth 2 (two) times daily.    09/14/2023 Morning   furosemide  (LASIX ) 20 MG tablet Take 20 mg by mouth daily.   09/14/2023 Morning   latanoprost (XALATAN) 0.005 % ophthalmic solution Place 1 drop into the left eye daily.   09/14/2023 Morning   losartan (COZAAR) 100 MG tablet Take 100 mg by mouth daily.   09/14/2023 Morning   Melatonin 5 MG TABS Take 10 mg by mouth at bedtime. 30 min before bed   09/13/2023 Bedtime   omeprazole (PRILOSEC) 20 MG capsule Take 20 mg by mouth daily.   09/14/2023 Morning    Probiotic Product (PROBIOTIC DAILY PO) Take 1 tablet by mouth daily. Senior   09/14/2023 Morning   silodosin  (RAPAFLO ) 8 MG CAPS capsule TAKE 1 CAPSULE BY MOUTH DAILY WITH BREAKFAST (Patient taking differently: Take 8 mg by mouth daily.) 90 capsule 3 09/13/2023 at  6:00 PM   sodium chloride  (OCEAN) 0.65 % SOLN nasal spray Place 2 sprays into both nostrils as needed for congestion.   09/14/2023 Morning   tadalafil  (CIALIS ) 5 MG tablet Take 1 tablet by mouth once daily 90 tablet 0 09/14/2023 Morning   SYRINGE-NEEDLE, DISP, 3 ML (B-D 3CC LUER-LOK SYR 22GX1-1/2) 22G X 1-1/2" 3 ML MISC USE AS DIRECTED  WITH TESTOSTERONE  12 each 20    testosterone  cypionate (DEPOTESTOSTERONE CYPIONATE) 200 MG/ML injection Inject 0.3 mLs (60 mg total) into the muscle once a week. DISCARD REMAINING AS THESE ARE SINGLE USE VIALS. (Patient taking differently: Inject 60 mg into the muscle every 14 (fourteen) days. EVERY OTHER SATURDAY. DISCARD REMAINING AS THESE ARE SINGLE USE VIALS.) 5 mL 0    Social History   Socioeconomic History   Marital status: Married    Spouse name: cindy   Number of children: 1   Years of education: Not on file   Highest education level: Professional school degree (e.g., MD, DDS, DVM, JD)  Occupational History   Not on file  Tobacco Use   Smoking status: Never   Smokeless tobacco: Never  Vaping Use   Vaping status: Never Used  Substance and Sexual Activity   Alcohol use: No   Drug use: No   Sexual activity: Not Currently    Birth control/protection: None  Other Topics Concern   Not on file  Social History Narrative   Not on file   Social Drivers of Health   Financial Resource Strain: Not on file  Food Insecurity: No Food Insecurity (09/15/2023)   Hunger Vital Sign    Worried About Running Out of Food in the Last Year: Never true    Ran Out of Food in the Last Year: Never true  Transportation Needs: No Transportation Needs (09/15/2023)   PRAPARE - Scientist, research (physical sciences) (Medical): No    Lack of Transportation (Non-Medical): No  Physical Activity: Not on file  Stress: Not on file  Social Connections: Patient Unable To Answer (09/15/2023)   Social Connection and Isolation Panel [NHANES]    Frequency of Communication with Friends and Family: Patient unable to answer    Frequency of Social Gatherings with Friends and Family: Patient unable to answer    Attends Religious Services: Patient unable to answer    Active Member of Clubs or Organizations: Patient unable to answer    Attends Banker Meetings: Patient unable to answer    Marital Status: Patient unable to answer  Intimate Partner Violence: Unknown (09/15/2023)   Humiliation, Afraid, Rape, and Kick questionnaire    Fear of Current or Ex-Partner: Patient unable to answer    Emotionally Abused: Patient unable to answer    Physically Abused: Not on file    Sexually Abused: Patient unable to answer    Family History  Problem Relation Age of Onset   Asperger's syndrome Mother      Vitals:   09/21/23 0900 09/21/23 0915 09/21/23 0930 09/21/23 0945  BP: 96/63  92/61   Pulse: 69 72 73 73  Resp: 12 13 13 12   Temp: 98.2 F (36.8 C) 98.2 F (36.8 C) 98.4 F (36.9 C) 98.4 F (36.9 C)  TempSrc: Bladder     SpO2: 96% 95% 95% 94%  Weight:      Height:        PHYSICAL EXAM General: Chronically ill-appearing elderly male, well nourished, in no acute distress. HEENT: Normocephalic and atraumatic. Neck: No JVD.   Lungs: Intubated.  Mechanical breath sounds bilaterally. Heart: HRRR. Normal S1 and S2 without gallops or murmurs.  Abdomen: Non-distended appearing.  Msk: Normal strength and tone for age. Extremities: Warm and well perfused. No clubbing, cyanosis. No edema.  Neuro: Alert and oriented X 3. Psych: Answers questions appropriately.   Labs: Basic Metabolic Panel: Recent Labs    09/20/23 0437 09/21/23  0458  NA 149* 146*  K 3.3* 3.3*  CL 103 98  CO2 35* 38*   GLUCOSE 164* 159*  BUN 42* 42*  CREATININE 0.89 0.88  CALCIUM  7.6* 7.8*  MG 2.7* 2.7*  PHOS 2.2* 3.4   Liver Function Tests: No results for input(s): "AST", "ALT", "ALKPHOS", "BILITOT", "PROT", "ALBUMIN" in the last 72 hours.  No results for input(s): "LIPASE", "AMYLASE" in the last 72 hours. CBC: Recent Labs    09/19/23 0409 09/21/23 0926  WBC 6.9 6.4  NEUTROABS 5.6  --   HGB 12.3* 11.8*  HCT 38.1* 36.1*  MCV 97.4 97.3  PLT 179 235   Cardiac Enzymes: Recent Labs    09/18/23 1348 09/18/23 1839  TROPONINIHS 1,714* 1,595*   BNP: No results for input(s): "BNP" in the last 72 hours.  D-Dimer: No results for input(s): "DDIMER" in the last 72 hours. Hemoglobin A1C: No results for input(s): "HGBA1C" in the last 72 hours. Fasting Lipid Panel: Recent Labs    09/21/23 0458  TRIG 190*   Thyroid Function Tests: No results for input(s): "TSH", "T4TOTAL", "T3FREE", "THYROIDAB" in the last 72 hours.  Invalid input(s): "FREET3"  Anemia Panel: No results for input(s): "VITAMINB12", "FOLATE", "FERRITIN", "TIBC", "IRON", "RETICCTPCT" in the last 72 hours.   Radiology: [facility] Chest Port 1 View Result Date: 09/20/2023 CLINICAL DATA:  73 year old male respiratory failure, intubated. EXAM: PORTABLE CHEST 1 VIEW COMPARISON:  Portable chest yesterday and earlier. FINDINGS: Portable AP upright view at 0942 hours. Stable lines and tubes. Left chest pacer/resuscitation pads persist. Stable lung volumes and mediastinal contours. Stable left chest AICD. Stable ventilation since yesterday. No pneumothorax, pulmonary edema. Patchy left greater than right lung base opacity. Paucity of bowel gas in the upper abdomen. Stable visualized osseous structures. IMPRESSION: 1. Stable lines and tubes. 2. Stable ventilation since yesterday. Patchy left greater than right lung base opacity. Electronically Signed   By: Marlise Simpers M.D.   On: 09/20/2023 10:26   DG Chest Port 1 View Result Date: 09/19/2023 CLINICAL  DATA:  73 year old male with respiratory failure. EXAM: PORTABLE CHEST 1 VIEW COMPARISON:  Portable AP semi upright view at 0751 hours. FINDINGS: Portable AP semi upright view at 0751 hours. Less rotated to the left now. Pacer and resuscitation pads project over the lower chest. Stable left chest AICD. Endotracheal tube tip remains satisfactory. Stable lines and tubes. Stable low lung volumes, patchy perihilar and lung base opacity. Ventilation is stable with no pneumothorax, pleural effusion identified. And ventilation has improved compared to 09/17/2023. Stable cardiac size and mediastinal contours. Paucity of bowel gas now.  Stable visualized osseous structures. IMPRESSION: 1. Stable lines and tubes. 2. Stable ventilation since yesterday, improved since 09/17/2023. Stable low lung volumes and patchy perihilar and lung base opacity. Electronically Signed   By: Marlise Simpers M.D.   On: 09/19/2023 08:02   DG Abd 1 View Result Date: 09/18/2023 CLINICAL DATA:  Check gastric catheter placement EXAM: ABDOMEN - 1 VIEW COMPARISON:  09/17/2023 FINDINGS: Gastric catheter is noted coiled within the stomach. No free air is seen. Postsurgical changes in the lower lumbar spine are noted. No obstructive pattern is seen. IMPRESSION: Gastric catheter in the stomach. Electronically Signed   By: Violeta Grey M.D.   On: 09/18/2023 11:18   DG Chest Port 1 View Result Date: 09/18/2023 CLINICAL DATA:  Shortness of breath and chest pain EXAM: PORTABLE CHEST 1 VIEW COMPARISON:  09/17/2023 FINDINGS: Cardiac shadow is stable. Defibrillator is again noted and stable. Endotracheal 2  and gastric catheter are seen. Lungs are well aerated bilaterally. Improved aeration is noted with decrease in edematous changes. Some basilar opacities are again seen bilaterally. IMPRESSION: Improved aeration when compared with the prior exam. Some persistent basilar opacities remain. Electronically Signed   By: Violeta Grey M.D.   On: 09/18/2023 11:09   DG  Abd 1 View Result Date: 09/17/2023 CLINICAL DATA:  OG tube placement. EXAM: ABDOMEN - 1 VIEW COMPARISON:  09/14/2023 FINDINGS: OG tube tip is positioned in the mid stomach with proximal side port below the expected location of the GE junction. Nonspecific bowel gas pattern within the visualized abdomen. IMPRESSION: OG tube tip is positioned in the mid stomach. Electronically Signed   By: Donnal Fusi M.D.   On: 09/17/2023 06:42   DG Chest Port 1 View Result Date: 09/17/2023 CLINICAL DATA:  73 year old male intubated, central line placement, bilateral airspace opacity. EXAM: PORTABLE CHEST 1 VIEW COMPARISON:  Portable chest 09/15/2023 and earlier. FINDINGS: Portable AP supine view at 0610 hours. Endotracheal tube projects over the midline trachea in good position between the clavicles and carina. Enteric tube has been removed. Stable right IJ approach central line. Stable left chest AICD. Similar lung volumes but diffuse bilateral increased pulmonary opacity with combined reticulonodular and vague/airspace appearance. No pneumothorax or pleural effusion. No air bronchograms. Stable cardiac size and mediastinal contours. No acute osseous abnormality identified. Paucity of bowel gas in the visible abdomen. IMPRESSION: 1. Satisfactory ET tube. Enteric tube removed. Stable right IJ approach central line. 2. Increased bilateral pulmonary opacity from two days ago, differential considerations include progressive pulmonary edema, bilateral infection, ARDS. No pleural effusion is evident. Electronically Signed   By: Marlise Simpers M.D.   On: 09/17/2023 06:36   ECHOCARDIOGRAM COMPLETE Result Date: 09/16/2023    ECHOCARDIOGRAM REPORT   Patient Name:   DR. Hannah Lewis [name] Date of Exam: 09/15/2023 Medical Rec #:  [id number]             Height:       66.0 in Accession #:    [id number]            Weight:       200.6 lb Date of Birth:  Nov 06, 1950             BSA:          2.002 m Patient Age:    73 years              BP:            106/68 mmHg Patient Gender: M                     HR:           77 bpm. Exam Location:  [facility] Procedure: 2D Echo, Cardiac Doppler and Color Doppler (Both Spectral and Color            Flow Doppler were utilized during procedure). Indications:     Elevated Troponin  History:         Patient has no prior history of Echocardiogram examinations.                  CAD, Stroke, Signs/Symptoms:Dyspnea; Risk Factors:Hypertension                  and Dyslipidemia.  Sonographer:     Brigid Canada RDCS Referring Phys:  [id number] Delanna Fears Diagnosing Phys: Sabina Custovic IMPRESSIONS  1. Left ventricular ejection  fraction, by estimation, is 35 to 40%. The left ventricle has moderately decreased function. The left ventricle demonstrates regional wall motion abnormalities (see scoring diagram/findings for description). Left ventricular  diastolic parameters are consistent with Grade I diastolic dysfunction (impaired relaxation).  2. Right ventricular systolic function is normal. The right ventricular size is normal. There is mildly elevated pulmonary artery systolic pressure. The estimated right ventricular systolic pressure is 42.7 mmHg.  3. The mitral valve is normal in structure. Moderate mitral valve regurgitation. No evidence of mitral stenosis.  4. The aortic valve is normal in structure. Aortic valve regurgitation is not visualized. No aortic stenosis is present.  5. The inferior vena cava is normal in size with greater than 50% respiratory variability, suggesting right atrial pressure of 3 mmHg. FINDINGS  Left Ventricle: Left ventricular ejection fraction, by estimation, is 35 to 40%. The left ventricle has moderately decreased function. The left ventricle demonstrates regional wall motion abnormalities. The left ventricular internal cavity size was normal in size. There is no left ventricular hypertrophy. Left ventricular diastolic parameters are consistent with Grade I diastolic dysfunction (impaired  relaxation).  LV Wall Scoring: The antero-lateral wall and apical lateral segment are hypokinetic. Right Ventricle: The right ventricular size is normal. No increase in right ventricular wall thickness. Right ventricular systolic function is normal. There is mildly elevated pulmonary artery systolic pressure. The tricuspid regurgitant velocity is 2.63  m/s, and with an assumed right atrial pressure of 15 mmHg, the estimated right ventricular systolic pressure is 42.7 mmHg. Left Atrium: Left atrial size was normal in size. Right Atrium: Right atrial size was normal in size. Pericardium: There is no evidence of pericardial effusion. Mitral Valve: The mitral valve is normal in structure. Moderate mitral valve regurgitation. No evidence of mitral valve stenosis. Tricuspid Valve: The tricuspid valve is normal in structure. Tricuspid valve regurgitation is mild. The aortic valve is normal in structure. Aortic valve regurgitation is not visualized. No aortic stenosis is present. Pulmonic Valve: The pulmonic valve was normal in structure. Pulmonic valve regurgitation is not visualized. Aorta: The aortic root is normal in size and structure. Venous: The inferior vena cava is normal in size with greater than 50% respiratory variability, suggesting right atrial pressure of 3 mmHg. IAS/Shunts: No atrial level shunt detected by color flow Doppler.  LEFT VENTRICLE PLAX 2D LVIDd:         5.50 cm   Diastology LVIDs:         4.80 cm   LV e' medial:    6.71 cm/s LV PW:         1.00 cm   LV E/e' medial:  12.9 LV IVS:        1.00 cm   LV e' lateral:   12.83 cm/s LVOT diam:     2.30 cm   LV E/e' lateral: 6.8 LV SV:         63 LV SV Index:   32 LVOT Area:     4.15 cm  RIGHT VENTRICLE             IVC RV Basal diam:  3.70 cm     IVC diam: 2.30 cm RV S prime:     14.50 cm/s TAPSE (M-mode): 2.5 cm LEFT ATRIUM             Index        RIGHT ATRIUM           Index LA diam:  4.70 cm 2.35 cm/m   RA Area:     12.60 cm LA Vol (A2C):    61.3 ml 30.61 ml/m  RA Volume:   31.10 ml  15.53 ml/m LA Vol (A4C):   36.9 ml 18.43 ml/m LA Biplane Vol: 48.5 ml 24.22 ml/m  AORTIC VALVE LVOT Vmax:   82.47 cm/s LVOT Vmean:  54.333 cm/s LVOT VTI:    0.153 m AI PHT:      376 msec  AORTA Ao Root diam: 3.20 cm Ao Asc diam:  3.70 cm MITRAL VALVE               TRICUSPID VALVE MV Area (PHT): 5.14 cm    TR Peak grad:   27.7 mmHg MV Decel Time: 148 msec    TR Vmax:        263.00 cm/s MV E velocity: 86.75 cm/s MV A velocity: 55.30 cm/s  SHUNTS MV E/A ratio:  1.57        Systemic VTI:  0.15 m                            Systemic Diam: 2.30 cm Lanell Pinta Custovic Electronically signed by Isabell Manzanilla Signature Date/Time: 09/16/2023/11:03:59 AM    Final    DG Chest Port 1 View Result Date: 09/15/2023 EXAM: 1 VIEW XRAY OF THE CHEST 09/15/2023 10:35:00 AM COMPARISON: 1 view chest x-ray 05/16/2023 at 2:38 PM. CLINICAL HISTORY: Endotracheally intubated. FINDINGS: LUNGS AND PLEURA: Mild edema and bilateral effusions are similar to the prior study. Bibasilar airspace opacity likely reflects atelectasis. HEART AND MEDIASTINUM: The heart is enlarged. BONES AND SOFT TISSUES: No acute osseous abnormality. LINES AND TUBES: The endotracheal tube is stable, 3.5 cm above the carina. A right IJ line is stable. IMPRESSION: 1. Stable endotracheal tube and right IJ line. 2. Enlarged heart. 3. Mild edema and bilateral effusions, similar to the prior study. 4. Bibasilar airspace opacity, likely atelectasis. Electronically signed by: Audree Leas MD 09/15/2023 01:26 PM EDT RP Workstation: [id number]   DG Chest Port 1 View Result Date: 09/15/2023 CLINICAL DATA:  Central line placement EXAM: PORTABLE CHEST 1 VIEW COMPARISON:  Radiograph and CT 09/14/2023 FINDINGS: Stable cardiomegaly. Pulmonary vascular congestion and bilateral ground-glass opacities. Small right pleural effusion and right basilar airspace opacities. No pneumothorax. Endotracheal tube tip in the intrathoracic trachea  3.0 cm from the carina. Subdiaphragmatic enteric tube. Right IJ CVC tip in the mid SVC. Left chest wall ICD. IMPRESSION: 1. Right IJ CVC tip in the mid SVC. No pneumothorax. 2. Similar findings of congestive heart failure. Electronically Signed   By: Rozell Cornet M.D.   On: 09/15/2023 02:52   CT Angio Chest PE W and/or Wo Contrast Result Date: 09/14/2023 CLINICAL DATA:  Cold symptoms, chest pain, short of breath, abdominal pain EXAM: CT ANGIOGRAPHY CHEST CT ABDOMEN AND PELVIS WITH CONTRAST TECHNIQUE: Multidetector CT imaging of the chest was performed using the standard protocol during bolus administration of intravenous contrast. Multiplanar CT image reconstructions and MIPs were obtained to evaluate the vascular anatomy. Multidetector CT imaging of the abdomen and pelvis was performed using the standard protocol during bolus administration of intravenous contrast. RADIATION DOSE REDUCTION: This exam was performed according to the departmental dose-optimization program which includes automated exposure control, adjustment of the mA and/or kV according to patient size and/or use of iterative reconstruction technique. CONTRAST:  OMNIPAQUE  IOHEXOL  350 MG/ML SOLN COMPARISON:  01/02/2010, 08/23/2013, 09/14/2023 FINDINGS: CTA CHEST FINDINGS Cardiovascular: This  is a technically adequate evaluation of the pulmonary vasculature. No filling defects or pulmonary emboli. Mild cardiomegaly with left ventricular dilatation. No pericardial effusion. Dual lead cardiac pacer, proximal lead in the right atrium and distal lead in the right ventricle. 4.1 cm ascending thoracic aortic aneurysm. No evidence of dissection. Atherosclerosis of the aorta and coronary vasculature. Mediastinum/Nodes: Endotracheal tube terminates just above carina. Enteric catheter extends into the gastric lumen. No pathologic adenopathy. Lungs/Pleura: There is dense bilateral perihilar airspace disease, greatest in the lower lobes. Trace  bilateral pleural effusions. No pneumothorax. Central airways are patent. Musculoskeletal: No acute or destructive bony abnormalities. Reconstructed images demonstrate no additional findings. Review of the MIP images confirms the above findings. CT ABDOMEN and PELVIS FINDINGS Hepatobiliary: No focal liver abnormality is seen. No gallstones, gallbladder wall thickening, or biliary dilatation. Pancreas: Unremarkable. No pancreatic ductal dilatation or surrounding inflammatory changes. Spleen: Normal in size without focal abnormality. Adrenals/Urinary Tract: Adrenal glands are unremarkable. Kidneys are normal, without renal calculi, focal lesion, or hydronephrosis. The bladder is decompressed with an indwelling Foley catheter. Stomach/Bowel: No bowel obstruction or ileus. Normal appendix right lower quadrant. Scattered colonic diverticulosis without evidence of acute diverticulitis. Enteric catheter tip within the lumen of the gastric body. Vascular/Lymphatic: Aortic atherosclerosis. No enlarged abdominal or pelvic lymph nodes. Reproductive: Stable enlargement of the prostate. Other: No free fluid or free intraperitoneal gas. No abdominal wall hernia. Musculoskeletal: No acute or destructive bony abnormalities. Postsurgical changes from L2-L4 posterior fusion. Reconstructed images demonstrate no additional findings. Review of the MIP images confirms the above findings. IMPRESSION: Chest: 1. No evidence of pulmonary embolus. 2. Dense bilateral perihilar airspace disease, greatest in the lower lobes, which could reflect widespread infection or edema. 3. Trace bilateral pleural effusions. 4. Aortic Atherosclerosis (ICD10-I70.0). Coronary artery atherosclerosis. 5. 4.1 cm ascending thoracic aortic aneurysm. Recommend annual imaging followup by CTA or MRA. This recommendation follows 2010 ACCF/AHA/AATS/ACR/ASA/SCA/SCAI/SIR/STS/SVM Guidelines for the Diagnosis and Management of Patients with Thoracic Aortic Disease.  Circulation. 2010; 121: [id number]. Aortic aneurysm NOS (ICD10-I71.9) Abdomen/pelvis: 1. No acute intra-abdominal or intrapelvic process. Normal appendix. 2. Distal colonic diverticulosis without diverticulitis. 3. Enlarged prostate. 4.  Aortic Atherosclerosis (ICD10-I70.0). Electronically Signed   By: Bobbye Burrow M.D.   On: 09/14/2023 22:40   CT ABDOMEN PELVIS W CONTRAST Result Date: 09/14/2023 CLINICAL DATA:  Cold symptoms, chest pain, short of breath, abdominal pain EXAM: CT ANGIOGRAPHY CHEST CT ABDOMEN AND PELVIS WITH CONTRAST TECHNIQUE: Multidetector CT imaging of the chest was performed using the standard protocol during bolus administration of intravenous contrast. Multiplanar CT image reconstructions and MIPs were obtained to evaluate the vascular anatomy. Multidetector CT imaging of the abdomen and pelvis was performed using the standard protocol during bolus administration of intravenous contrast. RADIATION DOSE REDUCTION: This exam was performed according to the departmental dose-optimization program which includes automated exposure control, adjustment of the mA and/or kV according to patient size and/or use of iterative reconstruction technique. CONTRAST:  OMNIPAQUE  IOHEXOL  350 MG/ML SOLN COMPARISON:  01/02/2010, 08/23/2013, 09/14/2023 FINDINGS: CTA CHEST FINDINGS Cardiovascular: This is a technically adequate evaluation of the pulmonary vasculature. No filling defects or pulmonary emboli. Mild cardiomegaly with left ventricular dilatation. No pericardial effusion. Dual lead cardiac pacer, proximal lead in the right atrium and distal lead in the right ventricle. 4.1 cm ascending thoracic aortic aneurysm. No evidence of dissection. Atherosclerosis of the aorta and coronary vasculature. Mediastinum/Nodes: Endotracheal tube terminates just above carina. Enteric catheter extends into the gastric lumen. No pathologic adenopathy. Lungs/Pleura: There is  dense bilateral perihilar airspace disease,  greatest in the lower lobes. Trace bilateral pleural effusions. No pneumothorax. Central airways are patent. Musculoskeletal: No acute or destructive bony abnormalities. Reconstructed images demonstrate no additional findings. Review of the MIP images confirms the above findings. CT ABDOMEN and PELVIS FINDINGS Hepatobiliary: No focal liver abnormality is seen. No gallstones, gallbladder wall thickening, or biliary dilatation. Pancreas: Unremarkable. No pancreatic ductal dilatation or surrounding inflammatory changes. Spleen: Normal in size without focal abnormality. Adrenals/Urinary Tract: Adrenal glands are unremarkable. Kidneys are normal, without renal calculi, focal lesion, or hydronephrosis. The bladder is decompressed with an indwelling Foley catheter. Stomach/Bowel: No bowel obstruction or ileus. Normal appendix right lower quadrant. Scattered colonic diverticulosis without evidence of acute diverticulitis. Enteric catheter tip within the lumen of the gastric body. Vascular/Lymphatic: Aortic atherosclerosis. No enlarged abdominal or pelvic lymph nodes. Reproductive: Stable enlargement of the prostate. Other: No free fluid or free intraperitoneal gas. No abdominal wall hernia. Musculoskeletal: No acute or destructive bony abnormalities. Postsurgical changes from L2-L4 posterior fusion. Reconstructed images demonstrate no additional findings. Review of the MIP images confirms the above findings. IMPRESSION: Chest: 1. No evidence of pulmonary embolus. 2. Dense bilateral perihilar airspace disease, greatest in the lower lobes, which could reflect widespread infection or edema. 3. Trace bilateral pleural effusions. 4. Aortic Atherosclerosis (ICD10-I70.0). Coronary artery atherosclerosis. 5. 4.1 cm ascending thoracic aortic aneurysm. Recommend annual imaging followup by CTA or MRA. This recommendation follows 2010 ACCF/AHA/AATS/ACR/ASA/SCA/SCAI/SIR/STS/SVM Guidelines for the Diagnosis and Management of Patients  with Thoracic Aortic Disease. Circulation. 2010; 121: [id number]. Aortic aneurysm NOS (ICD10-I71.9) Abdomen/pelvis: 1. No acute intra-abdominal or intrapelvic process. Normal appendix. 2. Distal colonic diverticulosis without diverticulitis. 3. Enlarged prostate. 4.  Aortic Atherosclerosis (ICD10-I70.0). Electronically Signed   By: Bobbye Burrow M.D.   On: 09/14/2023 22:40   CT HEAD WO CONTRAST ( ) Result Date: 09/14/2023 CLINICAL DATA:  Head trauma, minor (Age >= 65y) EXAM: CT HEAD WITHOUT CONTRAST TECHNIQUE: Contiguous axial images were obtained from the base of the skull through the vertex without intravenous contrast. RADIATION DOSE REDUCTION: This exam was performed according to the departmental dose-optimization program which includes automated exposure control, adjustment of the mA and/or kV according to patient size and/or use of iterative reconstruction technique. COMPARISON:  CT head 08/15/2022 FINDINGS: Brain: Bilateral anterior inferior frontal lobe and bilateral anterior temporal lobe encephalomalacia. Left cerebellar encephalomalacia. No evidence of large-territorial acute infarction. No parenchymal hemorrhage. No mass lesion. No extra-axial collection. No mass effect or midline shift. No hydrocephalus. Basilar cisterns are patent. Vascular: No hyperdense vessel. Atherosclerotic calcifications are present within the cavernous internal carotid and vertebral arteries. Skull: No acute fracture or focal lesion. Old left occipital skull fracture. Sinuses/Orbits: Paranasal sinuses and mastoid air cells are clear. Bilateral lens replacement. Bilateral scleral buckle. Otherwise the orbits are unremarkable. Other: Partially visualized endotracheal tube. IMPRESSION: No acute intracranial abnormality. Electronically Signed   By: Morgane  Naveau M.D.   On: 09/14/2023 22:36   DG Abd Portable 1 View Result Date: 09/14/2023 CLINICAL DATA:  Enteric catheter placement EXAM: PORTABLE ABDOMEN - 1 VIEW COMPARISON:   None Available. FINDINGS: Frontal view of the lower chest and upper abdomen demonstrates enteric catheter passing below diaphragm tip projecting over gastric body. Interstitial and ground-glass opacities throughout the lungs consistent with edema. Unremarkable bowel gas pattern. IMPRESSION: 1. Enteric catheter tip projects over gastric body. Electronically Signed   By: Bobbye Burrow M.D.   On: 09/14/2023 20:55   DG Chest Port 1 View Result Date: 09/14/2023 CLINICAL DATA:  Intubated, CHF, tachypnea EXAM: PORTABLE CHEST 1 VIEW COMPARISON:  09/14/2023 FINDINGS: Single frontal view of the chest demonstrates endotracheal tube overlying tracheal air column, tip of proximally 2 cm above carina. Dual lead pacemaker/AICD unchanged. Cardiac silhouette remains mildly enlarged. Stable interstitial and ground-glass opacities throughout the lungs. No large effusion or pneumothorax. No acute bony abnormalities. IMPRESSION: 1. No complication after intubation. 2. Stable findings of congestive heart failure. Electronically Signed   By: Bobbye Burrow M.D.   On: 09/14/2023 20:55   DG Chest Port 1 View Result Date: 09/14/2023 CLINICAL DATA:  Tachypnea.  CHF EXAM: PORTABLE CHEST 1 VIEW COMPARISON:  X-ray 01/14/2022 and older FINDINGS: Left upper chest battery pack for defibrillator with leads along the right side of the heart. Stable cardiopericardial silhouette. Tortuous aorta. Increasing vascular congestion and component of possible edema. No pneumothorax or effusion. No consolidation. Degenerative changes along the spine. Overlapping cardiac leads. IMPRESSION: Increasing vascular congestion and interstitial changes, possible edema. Defibrillator. Electronically Signed   By: Adrianna Horde M.D.   On: 09/14/2023 18:20    ECHO as above  TELEMETRY reviewed by me 09/21/2023: Sinus rhythm, rate 60s.(No further VT on tele)  EKG reviewed by me: Sinus tachycardia rate 144 bpm with ST elevation V2/V3. Repeat EKG ST changes  resolved.   Data reviewed by me 09/21/2023: last 24h vitals tele labs imaging I/O critical care team notes, advanced heart failure notes.   Principal Problem:   Acute respiratory failure with hypoxia (HCC) Active Problems:   Sepsis (HCC)   Altered mental status   HFrEF (heart failure with reduced ejection fraction) (HCC)   Non-ST elevation MI (NSTEMI) (HCC)   Aspiration pneumonia of both lower lobes (HCC)    ASSESSMENT AND PLAN:  Blake [name] is a 73 y.o. male  with a past medical history of coronary artery disease s/p stent to LAD, ischemic cardiomyopathy s/p ICD ([employer]), chronic HFrEF, OSA (on CPAP), hx CVA who presented to the ED on 09/14/2023 for dyspnea, chest pain and AMS. Patient recently seen at urgent care for nonproductive cough, chills/diaphoresis Troponins found to be elevated.  Cardiology was consulted for further evaluation  # NSTEMI # Acute hypoxic respiratory failure  # Acute metabolic encephalopathy  # Sepsis # Acute on chronic HFrEF # Ischemic cardiomyopathy s/p ICD # Nonsustained VT Patient presented to ED with chest pain, dyspnea. Patient intubated for airway protection. BNP elevated at 570.  Troponins elevated and trending 100 > 7400 > 15300 > 15200. Sinus tachycardia rate 144 bpm with ST elevation V2/V3. Repeat EKG ST changes resolved.  Echo this admission with  rEF (35-40%), with anterolateral wall and apical lateral segment hypokinesis (unchanged from prior echo in 2020). Patient intubated and off Levophed  (05/30). BNP elevated 670 > 960. CXR with significant pulmonary edema. VT noted per tele on 05/30 and amio gtt started. Weight down 9lbs since admission and appears euvolemic. Per tele no evidence of further VT. -Completed 48 hrs of heparin  gtt for medical management of NSTEMI. -Monitor and replenish electrolytes for a goal K >4, Mag >2. -Transitioned IV amio to PO amio 400 mg daily per tube.  -Continue aspirin  81 mg, atorvastatin  40 mg per tube  daily. -IV lasix  held due to hypovolemia and requiring volume resuscitation. Continue to hold diuresis.  -Consider resuming home GDMT when BP and renal function allow. -Consider LHC when hemodynamically stable and extubated. -Acute hypoxic respiratory failure, acute metabolic encephalopathy, sepsis management per primary. -Advanced heart failure consulted, appreciate recommendations.  This patient's plan of care was discussed and created with Dr. Bob Burn and he is in agreement.  Signed: Creighton Doffing, PA-C  09/21/2023, 10:17 AM [facility]

## 2023-09-21 NOTE — Consult Note (Signed)
 Inpatient Consult Note  Attending:   [name] Needle, MD, MBA, FARS   Otolaryngology-Head & Neck Surgery  This patient was seen today in at the request of No referring provider defined for this encounter. in regard to  Chief Complaint  Patient presents with   Shortness of Breath   Chest Pain     CHIEF COMPLAINT:   Chief Complaint  Patient presents with   Shortness of Breath   Chest Pain     HPI:  The patient is a 73 y.o. old child who presents today with complaint of  Chief Complaint  Patient presents with   Shortness of Breath   Chest Pain    Blake [name] is a 73 y.o. 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    Acute hypoxemic respiratory failure: In setting of high fever, respiratory virus panel + for rhinovirus.  Suspect viral infection with secondary bacterial PNA (possible aspiration).  Also a significant component of pulmonary edema.  CXR much worse this morning when he was re-intubated.  Concern for ARDS based on appearance, but hope we can improve with diuresis.   ENT consulted for tracheotomy placement by the ICU team - Dr. Auston Left.  Recent INR = 1.1.  Platelets within normal limits.  Currently on q24 Lovenox .     REVIEW OF SYSTEMS: The patient / family denies any recent history of fever, night sweats or weight loss, pain, cyanosis, clubbing or edema, respiratory distress, dizziness or imbalance.   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   Cognitive deficit as late effect of  traumatic brain injury (HCC) 03/06/2016   Coronary artery disease    Dementia (HCC)    Depression    Disorder of male genital organs 07/11/2013   Dyspnea    Encounter for long-term (current) use of medications 11/21/2014   Erectile dysfunction 07/11/2013   Executive function deficit 03/06/2016   GERD (gastroesophageal reflux disease)    Headache    stress   Hearing loss    Hyperlipidemia    Hypertension    Hypogonadism, 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   Major depression, recurrent, chronic (HCC) 03/06/2016   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   Mixed hyperlipidemia 02/02/2014   Myocardial infarction (HCC) 08/2013   OCD (obsessive compulsive disorder)    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   Other specified disorder of male genital organs(608.89) 07/11/2013   Prostatic hypertrophy    Pseudophakia of both eyes 04/20/2015   Raynaud's disease    Reduced libido 07/11/2013   Repeated falls    weak left ankle   Scoliosis    Stroke (HCC) 2/16   "light"   Synovitis    knees, ankles      SURGICAL HISTORY: Past Surgical History:  Procedure Laterality Date   BACK SURGERY  2011   rods and screws   CLOSED REDUCTION NASAL FRACTURE Bilateral 06/11/2018  Procedure: CLOSED REDUCTION NASAL FRACTURE;  Surgeon: [name], MD;  Location: [facility];  Service: ENT;  Laterality: Bilateral;   COLONOSCOPY  2015   CORONARY ANGIOPLASTY     2015 stent   CORONARY STENT INTERVENTION N/A 06/08/2019   Procedure: CORONARY STENT INTERVENTION;  Surgeon: Antonette Batters, MD;  Location: [facility];  Service: Cardiovascular;  Laterality: N/A;   CYSTOSCOPY  1984   EYE SURGERY Bilateral 2005,2006,2009   detached retina, cataract   FOOT ARTHRODESIS Left 05/30/2015   Procedure: ARTHRODESIS FOOT STJ LT FOOT;  Surgeon: Anell Baptist, DPM;  Location: [facility];  Service: Podiatry;   Laterality: Left;  WITH POPLITEAL BLOCK   FOOT SURGERY Left    HERNIA REPAIR Right 1969   inguinal   LEFT HEART CATH AND CORONARY ANGIOGRAPHY Left 06/08/2019   Procedure: LEFT HEART CATH AND CORONARY ANGIOGRAPHY;  Surgeon: Antonette Batters, MD;  Location: [facility];  Service: Cardiovascular;  Laterality: Left;   SEPTOPLASTY Bilateral 06/11/2018   Procedure: SEPTOPLASTY;  Surgeon: [name], MD;  Location: [facility];  Service: ENT;  Laterality: Bilateral;   SHOULDER SURGERY Right 2004   skull surgery     TOE SURGERY Right 2009   TONSILLECTOMY  2008     MEDICATIONS:  Current Facility-Administered Medications:    amiodarone  (PACERONE ) tablet 400 mg, 400 mg, Per Tube, Daily, Ramonita Burow, RPH, 400 mg at 09/21/23 1107   aspirin  chewable tablet 81 mg, 81 mg, Per Tube, Daily, Dgayli, Khabib, MD, 81 mg at 09/21/23 1107   atorvastatin  (LIPITOR) tablet 40 mg, 40 mg, Per Tube, Daily, Decoste, Gabriella, PA-C, 40 mg at 09/21/23 1107   Chlorhexidine  Gluconate Cloth 2 % PADS 6 each, 6 each, Topical, Daily, Dgayli, Khabib, MD, 6 each at 09/21/23 1000   docusate (COLACE) 50 MG/5ML liquid 100 mg, 100 mg, Per Tube, BID, Rust-Chester, Britton L, NP, 100 mg at 09/21/23 1108   enoxaparin  (LOVENOX ) injection 40 mg, 40 mg, Subcutaneous, Q24H, Lee, [place], NP, 40 mg at 09/21/23 0802   feeding supplement (PROSource TF20) liquid 60 mL, 60 mL, Per Tube, BID, Dgayli, Khabib, MD, 60 mL at 09/21/23 1108   feeding supplement (VITAL AF 1.2 CAL) liquid 1,000 mL, 1,000 mL, Per Tube, Continuous, Dgayli, Khabib, MD, Last Rate: 50 mL/hr at 09/21/23 1930, Infusion Verify at 09/21/23 1930   fentaNYL  (SUBLIMAZE ) injection 25-100 mcg, 25-100 mcg, Intravenous, Q30 min PRN, Rust-Chester, Britton L, NP, 50 mcg at 09/19/23 2209   free water  30 mL, 30 mL, Per Tube, Q4H, Dgayli, Khabib, MD, 30 mL at 09/21/23 1945   ipratropium-albuterol  (DUONEB) 0.5-2.5 (3) MG/3ML nebulizer solution 3 mL, 3 mL, Nebulization, Q6H  PRN, Ouma, Phoebe Breed, NP   ipratropium-albuterol  (DUONEB) 0.5-2.5 (3) MG/3ML nebulizer solution 3 mL, 3 mL, Nebulization, TID, Dgayli, Khabib, MD, 3 mL at 09/21/23 1940   naphazoline-glycerin  (CLEAR EYES REDNESS) ophth solution 1-2 drop, 1-2 drop, Both Eyes, QID PRN, Rust-Chester, Britton L, NP, 2 drop at 09/19/23 2006   Oral care mouth rinse, 15 mL, Mouth Rinse, Q2H, Ouma, Elizabeth Jannette Mend, NP, 15 mL at 09/21/23 1947   pantoprazole  (PROTONIX ) injection 40 mg, 40 mg, Intravenous, QHS, Dgayli, Khabib, MD, 40 mg at 09/20/23 2220   piperacillin-tazobactam (ZOSYN) IVPB 3.375 g, 3.375 g, Intravenous, Q8H, Kasa, Kurian, MD, Stopped at 09/21/23 1741   polyethylene glycol (MIRALAX  / GLYCOLAX ) packet 17 g, 17 g, Per Tube, BID, Dgayli, Khabib, MD, 17 g at 09/21/23 1108   propofol  (DIPRIVAN ) 1000 MG/100ML  infusion, 0-50 mcg/kg/min, Intravenous, Continuous, Rust-Chester, Britton L, NP, Last Rate: 16.4 mL/hr at 09/21/23 1930, 30 mcg/kg/min at 09/21/23 1930   QUEtiapine (SEROQUEL) tablet 25 mg, 25 mg, Per Tube, BID, Kasa, Kurian, MD, 25 mg at 09/21/23 1107   sodium chloride  flush (NS) 0.9 % injection 10-40 mL, 10-40 mL, Intracatheter, Q12H, Dgayli, Khabib, MD, 10 mL at 09/21/23 1108   sodium chloride  flush (NS) 0.9 % injection 10-40 mL, 10-40 mL, Intracatheter, PRN, Vergia Glasgow, MD   valproic  acid (DEPAKENE ) 250 MG/5ML solution 250 mg, 250 mg, Per Tube, TID, Vergia Glasgow, MD, 250 mg at 09/21/23 1608   ALLERGIES: Allergies  Allergen Reactions   Promethazine Other (See Comments)    Severe neuroleptic malignant syndrome requiring intubation   Mirtazapine Other (See Comments)    Other reaction(s): Other (See Comments) Irritability/anger, increased appetite, worsening depression Irritability/anger, increased appetite, worsening depression      BIRTH HX:  Term - no complications.   SOCIAL HISTORY: Social History   Socioeconomic History   Marital status: Married    Spouse name: cindy    Number of children: 1   Years of education: Not on file   Highest education level: Professional school degree (e.g., MD, DDS, DVM, JD)  Occupational History   Not on file  Tobacco Use   Smoking status: Never   Smokeless tobacco: Never  Vaping Use   Vaping status: Never Used  Substance and Sexual Activity   Alcohol use: No   Drug use: No   Sexual activity: Not Currently    Birth control/protection: None  Other Topics Concern   Not on file  Social History Narrative   Not on file   Social Drivers of Health   Financial Resource Strain: Not on file  Food Insecurity: No Food Insecurity (09/15/2023)   Hunger Vital Sign    Worried About Running Out of Food in the Last Year: Never true    Ran Out of Food in the Last Year: Never true  Transportation Needs: No Transportation Needs (09/15/2023)   PRAPARE - Administrator, Civil Service (Medical): No    Lack of Transportation (Non-Medical): No  Physical Activity: Not on file  Stress: Not on file  Social Connections: Patient Unable To Answer (09/15/2023)   Social Connection and Isolation Panel [NHANES]    Frequency of Communication with Friends and Family: Patient unable to answer    Frequency of Social Gatherings with Friends and Family: Patient unable to answer    Attends Religious Services: Patient unable to answer    Active Member of Clubs or Organizations: Patient unable to answer    Attends Banker Meetings: Patient unable to answer    Marital Status: Patient unable to answer  Intimate Partner Violence: Unknown (09/15/2023)   Humiliation, Afraid, Rape, and Kick questionnaire    Fear of Current or Ex-Partner: Patient unable to answer    Emotionally Abused: Patient unable to answer    Physically Abused: Not on file    Sexually Abused: Patient unable to answer     FAMILY HISTORY: Family History  Problem Relation Age of Onset   Asperger's syndrome Mother      PHYSICAL EXAM: BP 134/73 (BP Location:  Left Arm)   Pulse 68   Temp 98.3 F (36.8 C) (Axillary)   Resp 15   Ht 5' 5.98" (1.676 m)   Wt 83.3 kg   SpO2 98%   BMI 29.66 kg/m    Constitutional - please see medical record  for recorded vital signs.  Patient intubated and on ventilatory support.   Head and Face - inspection of the head and face revealed the scalp to be normal and skin without scars, lesions or masses.  There was no evidence of peri-orbital edema or erythema, or palpable sinus tenderness.  There was no evidence of salivary gland tenderness or enlargement.  Facial strength appeared intact and symmetric bilaterally.  Eyes - pupils were equal, round and reactive to light.  Extra-ocular movements were intact and vision was grossly normal bilaterally.  Ears - The external ears were normal in appearance.  Otoscopic exam revealed the external auditory canals to be patent.  The tympanic membranes were clear bilaterally, with normal mobility on pneumatic otoscopy.  There was no evidence of middle ear fluid, perforation, drainage or acute infection.  Hearing was grossly intact and speech reception thresholds were grossly within normal limits.  Nose - The external nose was normal in appearance.  The nasal dorsum was midline.  Exam of the anterior nasal cavity revealed no evidence of purulent drainage, polyps or mass or mucosal lesions.  The septum was midline and the inferior turbinate were normal in size and appearance.    Oral cavity - The mucosa of the oral cavity and oropharynx was normal without mass or mucosal lesion, erythema or exudate.  ET tube in place.    Neck - The neck was nontender and without palpable adenopathy, crepitus or mass lesion.  The trachea was midline.  The thyroid exam revealed no evidence of enlargement, tenderness or mass lesion.  Normal anatomy with no scars c/w previous neck surgery.    Repiratory - The patient was without respiratory distress, stridor or retractions.  Breath sounds were clear  bilaterally.  Cardiovascular - Cardiovascular exam revealed a regular rate and rhythm with no evidence of murmur.  Extremeties without evidence of cyanosis, clubbing or edema.  Lymphatic System - there was no evidence of palpable adenopathy in the neck, supraclavicular fossae or axillae.  Neurologic - Cranial nerves II through XII were grossly intact bilaterally.  In particular the VII cranial nerve was intact and symmetric bilaterally.   IMAGING:   AUDIOLOGY:   Medical Decision Making  ASSESSMENT:  Respiratory distress with ventilator dependence    PLAN:  Will tentatively plan for tracheotomy this coming Wednesday am in the [facility] OR pending stable respiratory and hemodynamic status.  Discussed with team today.  Will contact patients wife to discuss further and obtain consent including all risks, benefits, and options.    Asked the team to hold patient's Lovenox  starting tomorrow morning through morning after surgery if possible.   [name] Needle, MD, MBA, [facility] Otolaryngology-Head & Neck Surgery [facility] [phone]

## 2023-09-21 NOTE — Plan of Care (Signed)
  Problem: Coping: Goal: Level of anxiety will decrease Outcome: Progressing   Problem: Elimination: Goal: Will not experience complications related to bowel motility Outcome: Progressing   Problem: Safety: Goal: Ability to remain free from injury will improve Outcome: Progressing   Problem: Skin Integrity: Goal: Risk for impaired skin integrity will decrease Outcome: Progressing   Problem: Activity: Goal: Ability to tolerate increased activity will improve Outcome: Not Progressing   Problem: Respiratory: Goal: Ability to maintain a clear airway and adequate ventilation will improve Outcome: Not Progressing

## 2023-09-21 NOTE — Progress Notes (Signed)
 Please encourage his wife/him to discuss this with his current care team in the hospital. While he is admitted, I'll defer all decisions to them to ensure consistency and continuity of care. I'd be happy to reconnect and follow up with him after he is discharged.

## 2023-09-21 NOTE — Progress Notes (Signed)
 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   Per ed reports, and patient's wife who is at the bedside, Patient was seen at the urgent care for chief complaints of chills, body aches, intermittently and nonproductive coughing fits. He was diagnosed with acute upper respiratory infection and was sent home with promethazine-dextromethorphan and cefdinir (OMNICEF). [name] report that he took the prescription and went to bed. Later he woke up altered and delusional and c/o CP and SOB. He was noted to be sweating profusely with abnormal body temp per wife.   ED Course: Initial vital signs showed HR >180 beats/minute, BP >160 mm Hg, the RR 30s-40s breaths/minute, and the oxygen saturation 80s% on NRB and a temperature of 103.17F (39.6C).    Pertinent Labs/Diagnostics Findings: Na+/ K+:138/3.9  Glucose:127 CO2 21 WBC: unremarkable Lactic acid: 1.5~2.6 CK: COVID PCR: Negative troponin:106~7453   BNP:573.5  VBG: pO2 pend; pCO2 37; pH 7.49;  HCO3 28.2, %O2 Sat 43.7.  CXR> CTH> CTA Chest> CT Abd/pelvis>see report   Patient's agitation and restlessness worsen with HR up in the 170s to 180s.  2 mg of Ativan  was trialed but patient did not  calm down. Given worsening symptoms and high risk for decompensation, patient intubated for airway protection. He was started on broad-spectrum antibiotics Ampicillin , vanc, Ceftriaxone  and Acyclovir  due to unclear source for possible infection and cover broadly to include treatment for meningitis. PCCM consulted for admission.  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 and fell off, then he got back onto the horse and fell again on asphalt), HFrEF, ischemic cardiomyopathy, Hyperlipidemia, SVT, CAD, Depression, GERD, Hearing loss of both ears, MI, CVA, Hypertension, Hypotestosteronemia, ICD in place [facility] [employer] [id number] DEFIBRILLATOR, RESONATE EL DR DF4 S/N: [id number] implanted 11/21/2021), and OSA on CPAP     Significant Hospital Events: Including procedures, antibiotic start and stop dates in addition to other pertinent events   5/26: Admitted to ICU with acute altered mental status and respiratory failure in the setting of suspected NMS versus sepsis requiring intubation 5/27: fever improved, continued on vasopressors 5/28: extubated in the afternoon. Respiratory failure overnight, re-intubated 5/29: vented, increased oxygen requirements. Family at the bedside. Heart failure consult placed 5/30: vent requirements improved, diuresing, sedated. Reina Cara with SBT 5/31: remains vented, WUA this AM 6/01: vented, sedated, afebrile 6/2 remains on vent  Interim History / Subjective:  Remains critically ill Remains intubated Requires VENT support for survival Vent Mode: PRVC FiO2 (%):  [45 %] 45 % Set Rate:  [15 bmp] 15 bmp Vt Set:  [500 mL] 500 mL PEEP:  [5 cmH20] 5 cmH20 Plateau Pressure:  [10 cmH20-17 cmH20] 11 cmH20   Objective  Blood pressure 103/65, pulse 65, temperature 98.2 F (36.8 C), resp. rate (!) 21, height 5' 5.98"  (1.676 m), weight 83.3 kg, SpO2 95%. CVP:  [4 mmHg-13 mmHg] 8 mmHg  Vent Mode: PRVC FiO2 (%):  [45 %] 45 % Set Rate:  [15 bmp] 15 bmp Vt Set:  [500 mL] 500 mL PEEP:  [5 cmH20] 5 cmH20 Plateau Pressure:  [10 cmH20-17 cmH20] 11 cmH20   Intake/Output Summary (Last 24 hours) at 09/21/2023 8119 Last data filed at 09/21/2023 0600 Gross per 24 hour  Intake 3312.47 ml  Output 2160 ml  Net 1152.47 ml   Filed Weights   09/19/23 0500 09/20/23 0500 09/21/23 0500  Weight: 86.7 kg 84.6 kg 83.3 kg    REVIEW OF SYSTEMS  PATIENT IS UNABLE TO PROVIDE COMPLETE REVIEW OF SYSTEMS DUE TO SEVERE CRITICAL ILLNESS   PHYSICAL EXAMINATION:  GENERAL:critically ill appearing, +resp distress EYES: Pupils equal, round, reactive to light.  No scleral icterus.  MOUTH: Moist mucosal membrane. INTUBATED NECK: Supple.  PULMONARY: Lungs clear to auscultation, +rhonchi, +wheezing CARDIOVASCULAR: S1 and S2.  Regular rate and rhythm GASTROINTESTINAL: Soft, nontender, -distended. Positive bowel sounds.  MUSCULOSKELETAL: No swelling, clubbing, or edema.  NEUROLOGIC: obtunded,sedated SKIN:normal, warm to touch, Capillary refill delayed  Pulses present bilaterally   Assessment and Plan   73 year old male presenting with a few days' history of shortness of breath, viral symptoms, and encephalopathy. He was intubated on presentation to the ED due to agitation for airway protection and admitted to the ICU for further management, Diagnosis of b/l Pneumonia with failure to wean from ventilator, failed one trial of extubation  Severe ACUTE Hypoxic and Hypercapnic Respiratory Failure -continue Mechanical Ventilator support -Wean Fio2 and PEEP as tolerated -VAP/VENT bundle implementation - Wean PEEP & FiO2 as tolerated, maintain SpO2 > 88% - Head of bed elevated 30 degrees, VAP protocol in place - Plateau pressures less than 30 cm H20  - Intermittent chest x-ray & ABG PRN - Ensure adequate pulmonary hygiene  -will  perform SAT/SBT when respiratory parameters are met and when family at bedside Holding SBT today per wife's request(6/1)  INFECTIOUS DISEASE RHINO-Viral pneumonia with superimposed bacterial pneumonia Aspiration Pneumonia  Community Acquired Pneumonia -continue antibiotics as prescribed -follow up cultures Procal is improved.  ACUTE SYSTOLIC CARDIAC FAILURE- EF 35% Ventricular Tachycardia- (5/30) while on SBT for which an amiodarone  gtt was started.  -Lasix  as tolerated -follow up cardiac enzymes as indicated -follow up cardiology recs Type II NSTEMI  CAD s/p PCI to LAD  SEPTIC/Cardiogenic  shock SOURCE-Pneumonia/cardiac failure -use vasopressors to keep MAP>65 as needed -follow ABG and LA as needed -follow up cultures -emperic ABX    NEUROLOGY ACUTE METABOLIC ENCEPHALOPATHY Fronto-temporal Dementia -need for sedation -Goal RASS -2 to -3 Consider MRI/LP   RENAL -continue Foley Catheter-assess need -Avoid nephrotoxic agents -Follow urine output, BMP -Ensure adequate renal perfusion, optimize oxygenation -Renal dose medications   Intake/Output Summary (Last 24 hours) at 09/21/2023 0730 Last data filed at 09/21/2023 0600 Gross per 24 hour  Intake 3312.47 ml  Output 2160 ml  Net 1152.47 ml      Latest Ref Rng & Units 09/21/2023    4:58 AM 09/20/2023    4:37 AM 09/19/2023    4:09 AM  BMP  Glucose 70 - 99 mg/dL 147  829  562   BUN 8 - 23 mg/dL 42  42  46   Creatinine 0.61 - 1.24 mg/dL 1.30  8.65  7.84   Sodium 135 -  145 mmol/L 146  149  143   Potassium 3.5 - 5.1 mmol/L 3.3  3.3  3.8   Chloride 98 - 111 mmol/L 98  103  104   CO2 22 - 32 mmol/L 38  35  29   Calcium  8.9 - 10.3 mg/dL 7.8  7.6  7.5      ENDO - ICU hypoglycemic\Hyperglycemia protocol -check FSBS per protocol   GI GI PROPHYLAXIS as indicated NUTRITIONAL STATUS DIET-->TF's as tolerated Constipation protocol as indicated   ELECTROLYTES -follow labs as needed -replace as needed -pharmacy  consultation and following  RESTRICTIVE TRANSFUSION PROTOCOL TRANSFUSION  IF HGB<7  or ACTIVE BLEEDING OR DX of ACUTE CORONARY SYNDROMES    Best Practice (right click and "Reselect all SmartList Selections" daily)   Diet/type: tubefeeds DVT prophylaxis LMWH Pressure ulcer(s): N/A GI prophylaxis: PPI Lines: Central line and Arterial Line Foley:  Yes, and it is still needed Code Status:  full code Last date of multidisciplinary goals of care discussion [09/20/2023]  Labs   CBC: Recent Labs  Lab 09/14/23 1835 09/15/23 0244 09/15/23 0612 09/16/23 0359 09/17/23 0523 09/18/23 0430 09/19/23 0409  WBC 9.3 6.6  --  13.3* 9.4 9.3 6.9  NEUTROABS 7.5  --   --   --  7.6 7.6 5.6  HGB 15.6 13.8  --  12.8* 13.1 12.5* 12.3*  HCT 45.0 40.3  --  39.2 39.4 36.9* 38.1*  MCV 91.8 92.9  --  96.1 96.3 95.1 97.4  PLT 222 178 172 163 170 173 179    Basic Metabolic Panel: Recent Labs  Lab 09/17/23 0523 09/17/23 1006 09/17/23 2058 09/18/23 0430 09/18/23 1348 09/19/23 0409 09/20/23 0437 09/21/23 0458  NA 142   < >  --  143 144 143 149* 146*  K 5.3*   < > 3.5 4.2 3.9 3.8 3.3* 3.3*  CL 110   < >  --  106 105 104 103 98  CO2 24   < >  --  25 29 29  35* 38*  GLUCOSE 147*   < >  --  145* 160* 157* 164* 159*  BUN 32*   < >  --  38* 43* 46* 42* 42*  CREATININE 1.14   < >  --  1.09 1.03 1.02 0.89 0.88  CALCIUM  8.1*   < >  --  7.6* 7.2* 7.5* 7.6* 7.8*  MG 2.7*  --  2.3 2.6* 2.3  --  2.7* 2.7*  PHOS 2.6  --   --  3.4  --  2.5 2.2* 3.4   < > = values in this interval not displayed.   GFR: Estimated Creatinine Clearance: 73.5 mL/min (by C-G formula based on SCr of 0.88 mg/dL). Recent Labs  Lab 09/14/23 1835 09/14/23 2120 09/15/23 0128 09/15/23 0244 09/15/23 1610 09/16/23 0359 09/17/23 0523 09/18/23 0429 09/18/23 0430 09/19/23 0409 09/20/23 0437  PROCALCITON  --   --   --   --  3.43  --   --  2.99  --  1.78 1.20  WBC 9.3  --   --    < >  --  13.3* 9.4  --  9.3 6.9  --   LATICACIDVEN  1.5 2.6* 1.4  --   --   --   --   --   --   --   --    < > = values in this interval not displayed.    Liver Function Tests: Recent Labs  Lab 09/14/23 1835 09/15/23 0308 09/16/23  0359  AST 25 76* 59*  ALT 24 26 26   ALKPHOS 55 40 34*  BILITOT 0.9 0.8 0.5  PROT 7.2 5.8* 5.5*  ALBUMIN 4.0 3.1* 2.8*   No results for input(s): "LIPASE", "AMYLASE" in the last 168 hours. No results for input(s): "AMMONIA" in the last 168 hours.  ABG    Component Value Date/Time   PHART 7.44 09/19/2023 0943   PCO2ART 51 (H) 09/19/2023 0943   PO2ART 84 09/19/2023 0943   HCO3 34.6 (H) 09/19/2023 0943   ACIDBASEDEF 0.2 09/17/2023 0834   O2SAT 73.7 09/21/2023 0458     Coagulation Profile: Recent Labs  Lab 09/15/23 0244  INR 1.2    Cardiac Enzymes: Recent Labs  Lab 09/14/23 1835 09/15/23 0308 09/17/23 0523  CKTOTAL 136 612* 198    HbA1C: No results found for: "HGBA1C"  CBG: Recent Labs  Lab 09/20/23 1157 09/20/23 1603 09/20/23 1943 09/20/23 2306 09/21/23 0315  GLUCAP 126* 163* 142* 118* 150*    Review of Systems:   N/A  Past Medical History:  He,  has a past medical history of 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), Asperger's syndrome, 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), CAD (coronary artery disease) (09/21/2013), 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), Chronic left shoulder pain (04/03/2016), 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), Cognitive deficit as late effect of traumatic brain injury (HCC) (03/06/2016), Coronary artery disease, Dementia (HCC), Depression, Disorder of male genital organs (07/11/2013), Dyspnea, Encounter for long-term (current) use of medications (11/21/2014), Erectile dysfunction (07/11/2013), Executive function deficit (03/06/2016), GERD (gastroesophageal reflux disease), Headache, Hearing loss, Hyperlipidemia, Hypertension, Hypogonadism, male (07/11/2013), Hypotestosteronism (07/11/2013), Injury of frontal lobe (HCC), Major depression,  recurrent, chronic (HCC) (03/06/2016), Mild cognitive impairment, Mixed hyperlipidemia (02/02/2014), Myocardial infarction (HCC) (08/2013), OCD (obsessive compulsive disorder), Other retinal detachments (04/20/2015), Other specified disorder of male genital organs(608.89) (07/11/2013), Prostatic hypertrophy, Pseudophakia of both eyes (04/20/2015), Raynaud's disease, Reduced libido (07/11/2013), Repeated falls, Scoliosis, Stroke (HCC) (2/16), and Synovitis.   Surgical History:   Past Surgical History:  Procedure Laterality Date   BACK SURGERY  2011   rods and screws   CLOSED REDUCTION NASAL FRACTURE Bilateral 06/11/2018   Procedure: CLOSED REDUCTION NASAL FRACTURE;  Surgeon: [name], MD;  Location: [facility] ORS;  Service: ENT;  Laterality: Bilateral;   COLONOSCOPY  2015   CORONARY ANGIOPLASTY     2015 stent   CORONARY STENT INTERVENTION N/A 06/08/2019   Procedure: CORONARY STENT INTERVENTION;  Surgeon: Antonette Batters, MD;  Location: [facility] INVASIVE CV LAB;  Service: Cardiovascular;  Laterality: N/A;   CYSTOSCOPY  1984   EYE SURGERY Bilateral 2005,2006,2009   detached retina, cataract   FOOT ARTHRODESIS Left 05/30/2015   Procedure: ARTHRODESIS FOOT STJ LT FOOT;  Surgeon: Anell Baptist, DPM;  Location: [facility];  Service: Podiatry;  Laterality: Left;  WITH POPLITEAL BLOCK   FOOT SURGERY Left    HERNIA REPAIR Right 1969   inguinal   LEFT HEART CATH AND CORONARY ANGIOGRAPHY Left 06/08/2019   Procedure: LEFT HEART CATH AND CORONARY ANGIOGRAPHY;  Surgeon: Antonette Batters, MD;  Location: [facility] INVASIVE CV LAB;  Service: Cardiovascular;  Laterality: Left;   SEPTOPLASTY Bilateral 06/11/2018   Procedure: SEPTOPLASTY;  Surgeon: [name], MD;  Location: [facility] ORS;  Service: ENT;  Laterality: Bilateral;   SHOULDER SURGERY Right 2004   skull surgery     TOE SURGERY Right 2009   TONSILLECTOMY  2008     Social History:   reports that he has never smoked. He has never used smokeless  tobacco. He reports that he does not drink alcohol and does not use drugs.   Family History:  His family history includes Asperger's syndrome in his mother.   Allergies Allergies  Allergen Reactions   Promethazine Other (See Comments)    Severe neuroleptic malignant syndrome requiring intubation   Mirtazapine Other (See Comments)    Other reaction(s): Other (See Comments) Irritability/anger, increased appetite, worsening depression Irritability/anger, increased appetite, worsening depression      Home Medications  Prior to Admission medications   Medication Sig Start Date End Date Taking? Authorizing Provider  acetaminophen  (TYLENOL ) 500 MG tablet Take 1,000 mg by mouth every 6 (six) hours as needed for mild pain or moderate pain.   Yes [provider]  aspirin  EC 81 MG tablet Take 81 mg by mouth daily.   Yes [provider]  B Complex-C (SUPER B COMPLEX PO) Take 1 tablet by mouth daily.   Yes [provider]  Calcium  Carbonate-Vitamin D  600-200 MG-UNIT CAPS Take 1 tablet by mouth 2 (two) times daily.    Yes [provider]  carvedilol  (COREG ) 3.125 MG tablet Take 3.125 mg by mouth daily.  11/03/18  Yes [provider]  cefdinir (OMNICEF) 300 MG capsule Take 300 mg by mouth 2 (two) times daily. 09/14/23 09/21/23 Yes [provider]  clindamycin  (CLEOCIN  T) 1 % lotion Apply 1 application topically 2 (two) times daily as needed.    Yes [provider]  clopidogrel  (PLAVIX ) 75 MG tablet Take 75 mg by mouth daily. 09/27/18  Yes [provider]  divalproex  (DEPAKOTE  ER) 250 MG 24 hr tablet Total of 750 mg daily. Take along with 500 mg tab 08/24/23  Yes Hisada, Ivan Marion, MD  divalproex  (DEPAKOTE  ER) 500 MG 24 hr tablet Take 1 tablet (500 mg total) by mouth daily. 08/13/23 10/12/23 Yes Todd Fossa, MD  DULoxetine  (CYMBALTA ) 60 MG capsule Take 60 mg by mouth 2 (two) times daily.  11/17/18  Yes [provider]  FIBER PO Take 1  tablet by mouth 2 (two) times daily.    Yes [provider]  furosemide  (LASIX ) 20 MG tablet Take 20 mg by mouth daily. 11/03/18  Yes [provider]  latanoprost (XALATAN) 0.005 % ophthalmic solution Place 1 drop into the left eye daily. 11/06/21  Yes [provider]  losartan (COZAAR) 100 MG tablet Take 100 mg by mouth daily.   Yes [provider]  Melatonin 5 MG TABS Take 10 mg by mouth at bedtime. 30 min before bed   Yes [provider]  omeprazole (PRILOSEC) 20 MG capsule Take 20 mg by mouth daily.   Yes [provider]  Probiotic Product (PROBIOTIC DAILY PO) Take 1 tablet by mouth daily. Senior   Yes [provider]  silodosin  (RAPAFLO ) 8 MG CAPS capsule TAKE 1 CAPSULE BY MOUTH DAILY WITH BREAKFAST Patient taking differently: Take 8 mg by mouth daily. 03/16/23  Yes Stoioff, Kizzie Perks, MD  sodium chloride  (OCEAN) 0.65 % SOLN nasal spray Place 2 sprays into both nostrils as needed for congestion.   Yes [provider]  tadalafil  (CIALIS ) 5 MG tablet Take 1 tablet by mouth once daily 08/03/23  Yes Stoioff, Scott C, MD  SYRINGE-NEEDLE, DISP, 3 ML (B-D 3CC LUER-LOK SYR 22GX1-1/2) 22G X 1-1/2" 3 ML MISC USE AS DIRECTED WITH TESTOSTERONE  08/24/23   Stoioff, Kizzie Perks, MD  testosterone  cypionate (DEPOTESTOSTERONE CYPIONATE) 200 MG/ML injection Inject 0.3 mLs (60 mg total) into the muscle once a week.  DISCARD REMAINING AS THESE ARE SINGLE USE VIALS. Patient taking differently: Inject 60 mg into the muscle every 14 (fourteen) days. EVERY OTHER SATURDAY. DISCARD REMAINING AS THESE ARE SINGLE USE VIALS. 06/16/23   Geraline Knapp, MD      DVT/GI PRX  assessed I Assessed the need for Labs I Assessed the need for Foley I Assessed the need for Central Venous Line Family Discussion when available I Assessed the need for Mobilization I made an Assessment of medications to be adjusted accordingly Safety Risk assessment completed  CASE  DISCUSSED IN MULTIDISCIPLINARY ROUNDS WITH ICU TEAM     Critical Care Time devoted to patient care services described in this note is 55 minutes.  Critical care was necessary to treat /prevent imminent and life-threatening deterioration. Overall, patient is critically ill, prognosis is guarded.  Patient with Multiorgan failure and at high risk for cardiac arrest and death.    Lady Pier, M.D.  [facility]  Medical Director [facility] Medical Director [facility] Cardio-Pulmonary Department

## 2023-09-21 NOTE — Progress Notes (Signed)
 Yellow ring returned to wife.

## 2023-09-21 NOTE — Consult Note (Signed)
 Chief Complaint:  Poor oral intake  Procedure: Gastrostomy tube placement  Referring Provider(s): Dr. Cleve Dale  Supervising Physician: Elene Griffes  Patient Status: [facility] - In-pt  History of Present Illness: Blake [name] is a 73 y.o. 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 Patient was recently found to have acute URI and sent home with cefdinir. Per patient's wife, patient woke up later that night and was altered and delusion complaining of chest pain and shortness of breath. In the ED HR >180, RR 30-40s, and O2sat 80%s on non-rebreather, T-max 103.59F. Patient was intubated in the ED, started on broad spectrum antibiotics, and admitted to the ICU. Later found to have bilateral pneumonia with failure to wean from the ventilator on 5/28. Patient remains critically ill, intubated, and on ventilator. IR consulted for gastrostomy tube placement. Imaging and case reviewed and approved by Dr. Sylvester Evert.  Patient is currently intubated in no acute distress. Last dose of ASA or Plavix  on 5/26. VSS. H/H 12.3/38.6, INR 1.1. Unable to participate in ROS due to intubation/sedation.  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   Chronic left shoulder pain 04/03/2016   Chronic prostatitis 07/11/2013   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   Coronary artery disease    Dementia (HCC)    Depression    Disorder of male genital organs 07/11/2013   Dyspnea    Encounter for long-term (current) use of medications 11/21/2014   Erectile dysfunction 07/11/2013   Executive function deficit 03/06/2016    GERD (gastroesophageal reflux disease)    Headache    stress   Hearing loss    Hyperlipidemia    Hypertension    Hypogonadism, 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   Major depression, recurrent, chronic (HCC) 03/06/2016   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   Mixed hyperlipidemia 02/02/2014   Myocardial infarction (HCC) 08/2013   OCD (obsessive compulsive disorder)    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   Other specified disorder of male genital organs(608.89) 07/11/2013   Prostatic hypertrophy    Pseudophakia of both eyes 04/20/2015   Raynaud's disease    Reduced libido 07/11/2013   Repeated falls    weak left ankle   Scoliosis    Stroke (HCC) 2/16   "light"   Synovitis    knees, ankles    Past Surgical History:  Procedure Laterality Date   BACK SURGERY  2011   rods and screws   CLOSED REDUCTION NASAL FRACTURE Bilateral 06/11/2018   Procedure: CLOSED REDUCTION NASAL FRACTURE;  Surgeon: [name], MD;  Location: [facility];  Service: ENT;  Laterality: Bilateral;   COLONOSCOPY  2015   CORONARY ANGIOPLASTY     2015 stent   CORONARY STENT INTERVENTION N/A 06/08/2019   Procedure: CORONARY STENT INTERVENTION;  Surgeon: Antonette Batters, MD;  Location: [facility];  Service: Cardiovascular;  Laterality: N/A;   CYSTOSCOPY  1984   EYE SURGERY Bilateral [phone]  detached retina, cataract   FOOT ARTHRODESIS Left 05/30/2015   Procedure: ARTHRODESIS FOOT STJ LT FOOT;  Surgeon: Anell Baptist, DPM;  Location: [facility];  Service: Podiatry;  Laterality: Left;  WITH POPLITEAL BLOCK   FOOT SURGERY Left    HERNIA REPAIR Right 1969   inguinal   LEFT HEART CATH AND CORONARY ANGIOGRAPHY Left 06/08/2019   Procedure: LEFT HEART CATH AND CORONARY ANGIOGRAPHY;  Surgeon: Antonette Batters, MD;  Location: [facility];  Service: Cardiovascular;  Laterality: Left;    SEPTOPLASTY Bilateral 06/11/2018   Procedure: SEPTOPLASTY;  Surgeon: [name], MD;  Location: [facility];  Service: ENT;  Laterality: Bilateral;   SHOULDER SURGERY Right 2004   skull surgery     TOE SURGERY Right 2009   TONSILLECTOMY  2008    Allergies: Promethazine and Mirtazapine  Medications: Prior to Admission medications   Medication Sig Start Date End Date Taking? Authorizing Provider  acetaminophen  (TYLENOL ) 500 MG tablet Take 1,000 mg by mouth every 6 (six) hours as needed for mild pain or moderate pain.   Yes [provider]  aspirin  EC 81 MG tablet Take 81 mg by mouth daily.   Yes [provider]  B Complex-C (SUPER B COMPLEX PO) Take 1 tablet by mouth daily.   Yes [provider]  Calcium  Carbonate-Vitamin D  600-200 MG-UNIT CAPS Take 1 tablet by mouth 2 (two) times daily.    Yes [provider]  carvedilol  (COREG ) 3.125 MG tablet Take 3.125 mg by mouth daily.  11/03/18  Yes [provider]  cefdinir (OMNICEF) 300 MG capsule Take 300 mg by mouth 2 (two) times daily. 09/14/23 09/21/23 Yes [provider]  clindamycin  (CLEOCIN  T) 1 % lotion Apply 1 application topically 2 (two) times daily as needed.    Yes [provider]  clopidogrel  (PLAVIX ) 75 MG tablet Take 75 mg by mouth daily. 09/27/18  Yes [provider]  divalproex  (DEPAKOTE  ER) 250 MG 24 hr tablet Total of 750 mg daily. Take along with 500 mg tab 08/24/23  Yes Hisada, Ivan Marion, MD  divalproex  (DEPAKOTE  ER) 500 MG 24 hr tablet Take 1 tablet (500 mg total) by mouth daily. 08/13/23 10/12/23 Yes Todd Fossa, MD  DULoxetine  (CYMBALTA ) 60 MG capsule Take 60 mg by mouth 2 (two) times daily.  11/17/18  Yes [provider]  FIBER PO Take 1 tablet by mouth 2 (two) times daily.    Yes [provider]  furosemide  (LASIX ) 20 MG tablet Take 20 mg by mouth daily. 11/03/18  Yes [provider]  latanoprost (XALATAN) 0.005 % ophthalmic solution Place  1 drop into the left eye daily. 11/06/21  Yes [provider]  losartan (COZAAR) 100 MG tablet Take 100 mg by mouth daily.   Yes [provider]  Melatonin 5 MG TABS Take 10 mg by mouth at bedtime. 30 min before bed   Yes [provider]  omeprazole (PRILOSEC) 20 MG capsule Take 20 mg by mouth daily.   Yes [provider]  Probiotic Product (PROBIOTIC DAILY PO) Take 1 tablet by mouth daily. Senior   Yes [provider]  silodosin  (RAPAFLO ) 8 MG CAPS capsule TAKE 1 CAPSULE BY MOUTH DAILY WITH BREAKFAST Patient taking differently: Take 8 mg by mouth daily. 03/16/23  Yes Stoioff, Kizzie Perks, MD  sodium chloride  (OCEAN) 0.65 % SOLN nasal spray Place 2 sprays into both nostrils as needed for congestion.   Yes [provider]  tadalafil  (CIALIS ) 5  MG tablet Take 1 tablet by mouth once daily 08/03/23  Yes Stoioff, Scott C, MD  SYRINGE-NEEDLE, DISP, 3 ML (B-D 3CC LUER-LOK SYR 22GX1-1/2) 22G X 1-1/2" 3 ML MISC USE AS DIRECTED WITH TESTOSTERONE  08/24/23   Stoioff, Kizzie Perks, MD  testosterone  cypionate (DEPOTESTOSTERONE CYPIONATE) 200 MG/ML injection Inject 0.3 mLs (60 mg total) into the muscle once a week. DISCARD REMAINING AS THESE ARE SINGLE USE VIALS. Patient taking differently: Inject 60 mg into the muscle every 14 (fourteen) days. EVERY OTHER SATURDAY. DISCARD REMAINING AS THESE ARE SINGLE USE VIALS. 06/16/23   Stoioff, Kizzie Perks, MD     Family History  Problem Relation Age of Onset   Asperger's syndrome Mother     Social History   Socioeconomic History   Marital status: Married    Spouse name: cindy   Number of children: 1   Years of education: Not on file   Highest education level: Professional school degree (e.g., MD, DDS, DVM, JD)  Occupational History   Not on file  Tobacco Use   Smoking status: Never   Smokeless tobacco: Never  Vaping Use   Vaping status: Never Used  Substance and Sexual Activity   Alcohol use: No   Drug use: No    Sexual activity: Not Currently    Birth control/protection: None  Other Topics Concern   Not on file  Social History Narrative   Not on file   Social Drivers of Health   Financial Resource Strain: Not on file  Food Insecurity: No Food Insecurity (09/15/2023)   Hunger Vital Sign    Worried About Running Out of Food in the Last Year: Never true    Ran Out of Food in the Last Year: Never true  Transportation Needs: No Transportation Needs (09/15/2023)   PRAPARE - Administrator, Civil Service (Medical): No    Lack of Transportation (Non-Medical): No  Physical Activity: Not on file  Stress: Not on file  Social Connections: Patient Unable To Answer (09/15/2023)   Social Connection and Isolation Panel [NHANES]    Frequency of Communication with Friends and Family: Patient unable to answer    Frequency of Social Gatherings with Friends and Family: Patient unable to answer    Attends Religious Services: Patient unable to answer    Active Member of Clubs or Organizations: Patient unable to answer    Attends Banker Meetings: Patient unable to answer    Marital Status: Patient unable to answer     Review of Systems  Reason unable to perform ROS: sedated.   Vital Signs: BP 122/73 (BP Location: Left Arm)   Pulse 73   Temp 99 F (37.2 C) (Axillary)   Resp 15   Ht 5' 5.98" (1.676 m)   Wt 183 lb 10.3 oz (83.3 kg)   SpO2 93%   BMI 29.66 kg/m   Advance Care Plan: The advanced care plan/surrogate decision maker was discussed at the time of visit and documented in the medical record.    Physical Exam Vitals reviewed.  Constitutional:      Appearance: Normal appearance.     Comments: Ill appearing, elderly male  HENT:     Head: Normocephalic and atraumatic.  Cardiovascular:     Rate and Rhythm: Normal rate and regular rhythm.     Heart sounds: Normal heart sounds.  Pulmonary:     Comments: Intubated with mechanical breath sounds bilaterally Abdominal:      General: Abdomen is flat.  Palpations: Abdomen is soft.     Comments: No scars or signs of previous abdominal surgeries  Musculoskeletal:        General: Normal range of motion.  Skin:    General: Skin is warm and dry.  Neurological:     Comments: Sedated     Imaging: DG Chest Port 1 View Result Date: 09/20/2023 CLINICAL DATA:  73 year old male respiratory failure, intubated. EXAM: PORTABLE CHEST 1 VIEW COMPARISON:  Portable chest yesterday and earlier. FINDINGS: Portable AP upright view at 0942 hours. Stable lines and tubes. Left chest pacer/resuscitation pads persist. Stable lung volumes and mediastinal contours. Stable left chest AICD. Stable ventilation since yesterday. No pneumothorax, pulmonary edema. Patchy left greater than right lung base opacity. Paucity of bowel gas in the upper abdomen. Stable visualized osseous structures. IMPRESSION: 1. Stable lines and tubes. 2. Stable ventilation since yesterday. Patchy left greater than right lung base opacity. Electronically Signed   By: Marlise Simpers M.D.   On: 09/20/2023 10:26   DG Chest Port 1 View Result Date: 09/19/2023 CLINICAL DATA:  73 year old male with respiratory failure. EXAM: PORTABLE CHEST 1 VIEW COMPARISON:  Portable AP semi upright view at 0751 hours. FINDINGS: Portable AP semi upright view at 0751 hours. Less rotated to the left now. Pacer and resuscitation pads project over the lower chest. Stable left chest AICD. Endotracheal tube tip remains satisfactory. Stable lines and tubes. Stable low lung volumes, patchy perihilar and lung base opacity. Ventilation is stable with no pneumothorax, pleural effusion identified. And ventilation has improved compared to 09/17/2023. Stable cardiac size and mediastinal contours. Paucity of bowel gas now.  Stable visualized osseous structures. IMPRESSION: 1. Stable lines and tubes. 2. Stable ventilation since yesterday, improved since 09/17/2023. Stable low lung volumes and patchy perihilar and lung  base opacity. Electronically Signed   By: Marlise Simpers M.D.   On: 09/19/2023 08:02   DG Abd 1 View Result Date: 09/18/2023 CLINICAL DATA:  Check gastric catheter placement EXAM: ABDOMEN - 1 VIEW COMPARISON:  09/17/2023 FINDINGS: Gastric catheter is noted coiled within the stomach. No free air is seen. Postsurgical changes in the lower lumbar spine are noted. No obstructive pattern is seen. IMPRESSION: Gastric catheter in the stomach. Electronically Signed   By: Violeta Grey M.D.   On: 09/18/2023 11:18   DG Chest Port 1 View Result Date: 09/18/2023 CLINICAL DATA:  Shortness of breath and chest pain EXAM: PORTABLE CHEST 1 VIEW COMPARISON:  09/17/2023 FINDINGS: Cardiac shadow is stable. Defibrillator is again noted and stable. Endotracheal 2 and gastric catheter are seen. Lungs are well aerated bilaterally. Improved aeration is noted with decrease in edematous changes. Some basilar opacities are again seen bilaterally. IMPRESSION: Improved aeration when compared with the prior exam. Some persistent basilar opacities remain. Electronically Signed   By: Violeta Grey M.D.   On: 09/18/2023 11:09   DG Abd 1 View Result Date: 09/17/2023 CLINICAL DATA:  OG tube placement. EXAM: ABDOMEN - 1 VIEW COMPARISON:  09/14/2023 FINDINGS: OG tube tip is positioned in the mid stomach with proximal side port below the expected location of the GE junction. Nonspecific bowel gas pattern within the visualized abdomen. IMPRESSION: OG tube tip is positioned in the mid stomach. Electronically Signed   By: Donnal Fusi M.D.   On: 09/17/2023 06:42   DG Chest Port 1 View Result Date: 09/17/2023 CLINICAL DATA:  73 year old male intubated, central line placement, bilateral airspace opacity. EXAM: PORTABLE CHEST 1 VIEW COMPARISON:  Portable chest 09/15/2023 and earlier.  FINDINGS: Portable AP supine view at 0610 hours. Endotracheal tube projects over the midline trachea in good position between the clavicles and carina. Enteric tube has been  removed. Stable right IJ approach central line. Stable left chest AICD. Similar lung volumes but diffuse bilateral increased pulmonary opacity with combined reticulonodular and vague/airspace appearance. No pneumothorax or pleural effusion. No air bronchograms. Stable cardiac size and mediastinal contours. No acute osseous abnormality identified. Paucity of bowel gas in the visible abdomen. IMPRESSION: 1. Satisfactory ET tube. Enteric tube removed. Stable right IJ approach central line. 2. Increased bilateral pulmonary opacity from two days ago, differential considerations include progressive pulmonary edema, bilateral infection, ARDS. No pleural effusion is evident. Electronically Signed   By: Marlise Simpers M.D.   On: 09/17/2023 06:36   ECHOCARDIOGRAM COMPLETE Result Date: 09/16/2023    ECHOCARDIOGRAM REPORT   Patient Name:   DR. Hannah Lewis [name] Date of Exam: 09/15/2023 Medical Rec #:  [id number]             Height:       66.0 in Accession #:    [id number]            Weight:       200.6 lb Date of Birth:  06/10/1950             BSA:          2.002 m Patient Age:    73 years              BP:           106/68 mmHg Patient Gender: M                     HR:           77 bpm. Exam Location:  [facility] Procedure: 2D Echo, Cardiac Doppler and Color Doppler (Both Spectral and Color            Flow Doppler were utilized during procedure). Indications:     Elevated Troponin  History:         Patient has no prior history of Echocardiogram examinations.                  CAD, Stroke, Signs/Symptoms:Dyspnea; Risk Factors:Hypertension                  and Dyslipidemia.  Sonographer:     Brigid Canada RDCS Referring Phys:  [id number] Delanna Fears Diagnosing Phys: Sabina Custovic IMPRESSIONS  1. Left ventricular ejection fraction, by estimation, is 35 to 40%. The left ventricle has moderately decreased function. The left ventricle demonstrates regional wall motion abnormalities (see scoring diagram/findings for description). Left  ventricular  diastolic parameters are consistent with Grade I diastolic dysfunction (impaired relaxation).  2. Right ventricular systolic function is normal. The right ventricular size is normal. There is mildly elevated pulmonary artery systolic pressure. The estimated right ventricular systolic pressure is 42.7 mmHg.  3. The mitral valve is normal in structure. Moderate mitral valve regurgitation. No evidence of mitral stenosis.  4. The aortic valve is normal in structure. Aortic valve regurgitation is not visualized. No aortic stenosis is present.  5. The inferior vena cava is normal in size with greater than 50% respiratory variability, suggesting right atrial pressure of 3 mmHg. FINDINGS  Left Ventricle: Left ventricular ejection fraction, by estimation, is 35 to 40%. The left ventricle has moderately decreased function. The left ventricle demonstrates regional wall motion abnormalities. The left ventricular  internal cavity size was normal in size. There is no left ventricular hypertrophy. Left ventricular diastolic parameters are consistent with Grade I diastolic dysfunction (impaired relaxation).  LV Wall Scoring: The antero-lateral wall and apical lateral segment are hypokinetic. Right Ventricle: The right ventricular size is normal. No increase in right ventricular wall thickness. Right ventricular systolic function is normal. There is mildly elevated pulmonary artery systolic pressure. The tricuspid regurgitant velocity is 2.63  m/s, and with an assumed right atrial pressure of 15 mmHg, the estimated right ventricular systolic pressure is 42.7 mmHg. Left Atrium: Left atrial size was normal in size. Right Atrium: Right atrial size was normal in size. Pericardium: There is no evidence of pericardial effusion. Mitral Valve: The mitral valve is normal in structure. Moderate mitral valve regurgitation. No evidence of mitral valve stenosis. Tricuspid Valve: The tricuspid valve is normal in structure. Tricuspid  valve regurgitation is mild. The aortic valve is normal in structure. Aortic valve regurgitation is not visualized. No aortic stenosis is present. Pulmonic Valve: The pulmonic valve was normal in structure. Pulmonic valve regurgitation is not visualized. Aorta: The aortic root is normal in size and structure. Venous: The inferior vena cava is normal in size with greater than 50% respiratory variability, suggesting right atrial pressure of 3 mmHg. IAS/Shunts: No atrial level shunt detected by color flow Doppler.  LEFT VENTRICLE PLAX 2D LVIDd:         5.50 cm   Diastology LVIDs:         4.80 cm   LV e' medial:    6.71 cm/s LV PW:         1.00 cm   LV E/e' medial:  12.9 LV IVS:        1.00 cm   LV e' lateral:   12.83 cm/s LVOT diam:     2.30 cm   LV E/e' lateral: 6.8 LV SV:         63 LV SV Index:   32 LVOT Area:     4.15 cm  RIGHT VENTRICLE             IVC RV Basal diam:  3.70 cm     IVC diam: 2.30 cm RV S prime:     14.50 cm/s TAPSE (M-mode): 2.5 cm LEFT ATRIUM             Index        RIGHT ATRIUM           Index LA diam:        4.70 cm 2.35 cm/m   RA Area:     12.60 cm LA Vol (A2C):   61.3 ml 30.61 ml/m  RA Volume:   31.10 ml  15.53 ml/m LA Vol (A4C):   36.9 ml 18.43 ml/m LA Biplane Vol: 48.5 ml 24.22 ml/m  AORTIC VALVE LVOT Vmax:   82.47 cm/s LVOT Vmean:  54.333 cm/s LVOT VTI:    0.153 m AI PHT:      376 msec  AORTA Ao Root diam: 3.20 cm Ao Asc diam:  3.70 cm MITRAL VALVE               TRICUSPID VALVE MV Area (PHT): 5.14 cm    TR Peak grad:   27.7 mmHg MV Decel Time: 148 msec    TR Vmax:        263.00 cm/s MV E velocity: 86.75 cm/s MV A velocity: 55.30 cm/s  SHUNTS MV E/A ratio:  1.57  Systemic VTI:  0.15 m                            Systemic Diam: 2.30 cm Isabell Manzanilla Electronically signed by Isabell Manzanilla Signature Date/Time: 09/16/2023/11:03:59 AM    Final    DG Chest Port 1 View Result Date: 09/15/2023 EXAM: 1 VIEW XRAY OF THE CHEST 09/15/2023 10:35:00 AM COMPARISON: 1 view chest x-ray  05/16/2023 at 2:38 PM. CLINICAL HISTORY: Endotracheally intubated. FINDINGS: LUNGS AND PLEURA: Mild edema and bilateral effusions are similar to the prior study. Bibasilar airspace opacity likely reflects atelectasis. HEART AND MEDIASTINUM: The heart is enlarged. BONES AND SOFT TISSUES: No acute osseous abnormality. LINES AND TUBES: The endotracheal tube is stable, 3.5 cm above the carina. A right IJ line is stable. IMPRESSION: 1. Stable endotracheal tube and right IJ line. 2. Enlarged heart. 3. Mild edema and bilateral effusions, similar to the prior study. 4. Bibasilar airspace opacity, likely atelectasis. Electronically signed by: Audree Leas MD 09/15/2023 01:26 PM EDT RP Workstation: [id number]   DG Chest Port 1 View Result Date: 09/15/2023 CLINICAL DATA:  Central line placement EXAM: PORTABLE CHEST 1 VIEW COMPARISON:  Radiograph and CT 09/14/2023 FINDINGS: Stable cardiomegaly. Pulmonary vascular congestion and bilateral ground-glass opacities. Small right pleural effusion and right basilar airspace opacities. No pneumothorax. Endotracheal tube tip in the intrathoracic trachea 3.0 cm from the carina. Subdiaphragmatic enteric tube. Right IJ CVC tip in the mid SVC. Left chest wall ICD. IMPRESSION: 1. Right IJ CVC tip in the mid SVC. No pneumothorax. 2. Similar findings of congestive heart failure. Electronically Signed   By: Rozell Cornet M.D.   On: 09/15/2023 02:52   CT Angio Chest PE W and/or Wo Contrast Result Date: 09/14/2023 CLINICAL DATA:  Cold symptoms, chest pain, short of breath, abdominal pain EXAM: CT ANGIOGRAPHY CHEST CT ABDOMEN AND PELVIS WITH CONTRAST TECHNIQUE: Multidetector CT imaging of the chest was performed using the standard protocol during bolus administration of intravenous contrast. Multiplanar CT image reconstructions and MIPs were obtained to evaluate the vascular anatomy. Multidetector CT imaging of the abdomen and pelvis was performed using the standard protocol during  bolus administration of intravenous contrast. RADIATION DOSE REDUCTION: This exam was performed according to the departmental dose-optimization program which includes automated exposure control, adjustment of the mA and/or kV according to patient size and/or use of iterative reconstruction technique. CONTRAST:  OMNIPAQUE  IOHEXOL  350 MG/ML SOLN COMPARISON:  01/02/2010, 08/23/2013, 09/14/2023 FINDINGS: CTA CHEST FINDINGS Cardiovascular: This is a technically adequate evaluation of the pulmonary vasculature. No filling defects or pulmonary emboli. Mild cardiomegaly with left ventricular dilatation. No pericardial effusion. Dual lead cardiac pacer, proximal lead in the right atrium and distal lead in the right ventricle. 4.1 cm ascending thoracic aortic aneurysm. No evidence of dissection. Atherosclerosis of the aorta and coronary vasculature. Mediastinum/Nodes: Endotracheal tube terminates just above carina. Enteric catheter extends into the gastric lumen. No pathologic adenopathy. Lungs/Pleura: There is dense bilateral perihilar airspace disease, greatest in the lower lobes. Trace bilateral pleural effusions. No pneumothorax. Central airways are patent. Musculoskeletal: No acute or destructive bony abnormalities. Reconstructed images demonstrate no additional findings. Review of the MIP images confirms the above findings. CT ABDOMEN and PELVIS FINDINGS Hepatobiliary: No focal liver abnormality is seen. No gallstones, gallbladder wall thickening, or biliary dilatation. Pancreas: Unremarkable. No pancreatic ductal dilatation or surrounding inflammatory changes. Spleen: Normal in size without focal abnormality. Adrenals/Urinary Tract: Adrenal glands are unremarkable. Kidneys are normal, without renal  calculi, focal lesion, or hydronephrosis. The bladder is decompressed with an indwelling Foley catheter. Stomach/Bowel: No bowel obstruction or ileus. Normal appendix right lower quadrant. Scattered colonic  diverticulosis without evidence of acute diverticulitis. Enteric catheter tip within the lumen of the gastric body. Vascular/Lymphatic: Aortic atherosclerosis. No enlarged abdominal or pelvic lymph nodes. Reproductive: Stable enlargement of the prostate. Other: No free fluid or free intraperitoneal gas. No abdominal wall hernia. Musculoskeletal: No acute or destructive bony abnormalities. Postsurgical changes from L2-L4 posterior fusion. Reconstructed images demonstrate no additional findings. Review of the MIP images confirms the above findings. IMPRESSION: Chest: 1. No evidence of pulmonary embolus. 2. Dense bilateral perihilar airspace disease, greatest in the lower lobes, which could reflect widespread infection or edema. 3. Trace bilateral pleural effusions. 4. Aortic Atherosclerosis (ICD10-I70.0). Coronary artery atherosclerosis. 5. 4.1 cm ascending thoracic aortic aneurysm. Recommend annual imaging followup by CTA or MRA. This recommendation follows 2010 ACCF/AHA/AATS/ACR/ASA/SCA/SCAI/SIR/STS/SVM Guidelines for the Diagnosis and Management of Patients with Thoracic Aortic Disease. Circulation. 2010; 121: G956-O130. Aortic aneurysm NOS (ICD10-I71.9) Abdomen/pelvis: 1. No acute intra-abdominal or intrapelvic process. Normal appendix. 2. Distal colonic diverticulosis without diverticulitis. 3. Enlarged prostate. 4.  Aortic Atherosclerosis (ICD10-I70.0). Electronically Signed   By: Bobbye Burrow M.D.   On: 09/14/2023 22:40   CT ABDOMEN PELVIS W CONTRAST Result Date: 09/14/2023 CLINICAL DATA:  Cold symptoms, chest pain, short of breath, abdominal pain EXAM: CT ANGIOGRAPHY CHEST CT ABDOMEN AND PELVIS WITH CONTRAST TECHNIQUE: Multidetector CT imaging of the chest was performed using the standard protocol during bolus administration of intravenous contrast. Multiplanar CT image reconstructions and MIPs were obtained to evaluate the vascular anatomy. Multidetector CT imaging of the abdomen and pelvis was performed  using the standard protocol during bolus administration of intravenous contrast. RADIATION DOSE REDUCTION: This exam was performed according to the departmental dose-optimization program which includes automated exposure control, adjustment of the mA and/or kV according to patient size and/or use of iterative reconstruction technique. CONTRAST:  OMNIPAQUE  IOHEXOL  350 MG/ML SOLN COMPARISON:  01/02/2010, 08/23/2013, 09/14/2023 FINDINGS: CTA CHEST FINDINGS Cardiovascular: This is a technically adequate evaluation of the pulmonary vasculature. No filling defects or pulmonary emboli. Mild cardiomegaly with left ventricular dilatation. No pericardial effusion. Dual lead cardiac pacer, proximal lead in the right atrium and distal lead in the right ventricle. 4.1 cm ascending thoracic aortic aneurysm. No evidence of dissection. Atherosclerosis of the aorta and coronary vasculature. Mediastinum/Nodes: Endotracheal tube terminates just above carina. Enteric catheter extends into the gastric lumen. No pathologic adenopathy. Lungs/Pleura: There is dense bilateral perihilar airspace disease, greatest in the lower lobes. Trace bilateral pleural effusions. No pneumothorax. Central airways are patent. Musculoskeletal: No acute or destructive bony abnormalities. Reconstructed images demonstrate no additional findings. Review of the MIP images confirms the above findings. CT ABDOMEN and PELVIS FINDINGS Hepatobiliary: No focal liver abnormality is seen. No gallstones, gallbladder wall thickening, or biliary dilatation. Pancreas: Unremarkable. No pancreatic ductal dilatation or surrounding inflammatory changes. Spleen: Normal in size without focal abnormality. Adrenals/Urinary Tract: Adrenal glands are unremarkable. Kidneys are normal, without renal calculi, focal lesion, or hydronephrosis. The bladder is decompressed with an indwelling Foley catheter. Stomach/Bowel: No bowel obstruction or ileus. Normal appendix right lower  quadrant. Scattered colonic diverticulosis without evidence of acute diverticulitis. Enteric catheter tip within the lumen of the gastric body. Vascular/Lymphatic: Aortic atherosclerosis. No enlarged abdominal or pelvic lymph nodes. Reproductive: Stable enlargement of the prostate. Other: No free fluid or free intraperitoneal gas. No abdominal wall hernia. Musculoskeletal: No acute or destructive bony  abnormalities. Postsurgical changes from L2-L4 posterior fusion. Reconstructed images demonstrate no additional findings. Review of the MIP images confirms the above findings. IMPRESSION: Chest: 1. No evidence of pulmonary embolus. 2. Dense bilateral perihilar airspace disease, greatest in the lower lobes, which could reflect widespread infection or edema. 3. Trace bilateral pleural effusions. 4. Aortic Atherosclerosis (ICD10-I70.0). Coronary artery atherosclerosis. 5. 4.1 cm ascending thoracic aortic aneurysm. Recommend annual imaging followup by CTA or MRA. This recommendation follows 2010 ACCF/AHA/AATS/ACR/ASA/SCA/SCAI/SIR/STS/SVM Guidelines for the Diagnosis and Management of Patients with Thoracic Aortic Disease. Circulation. 2010; 121: X324-M010. Aortic aneurysm NOS (ICD10-I71.9) Abdomen/pelvis: 1. No acute intra-abdominal or intrapelvic process. Normal appendix. 2. Distal colonic diverticulosis without diverticulitis. 3. Enlarged prostate. 4.  Aortic Atherosclerosis (ICD10-I70.0). Electronically Signed   By: Bobbye Burrow M.D.   On: 09/14/2023 22:40   CT HEAD WO CONTRAST ( ) Result Date: 09/14/2023 CLINICAL DATA:  Head trauma, minor (Age >= 65y) EXAM: CT HEAD WITHOUT CONTRAST TECHNIQUE: Contiguous axial images were obtained from the base of the skull through the vertex without intravenous contrast. RADIATION DOSE REDUCTION: This exam was performed according to the departmental dose-optimization program which includes automated exposure control, adjustment of the mA and/or kV according to patient size  and/or use of iterative reconstruction technique. COMPARISON:  CT head 08/15/2022 FINDINGS: Brain: Bilateral anterior inferior frontal lobe and bilateral anterior temporal lobe encephalomalacia. Left cerebellar encephalomalacia. No evidence of large-territorial acute infarction. No parenchymal hemorrhage. No mass lesion. No extra-axial collection. No mass effect or midline shift. No hydrocephalus. Basilar cisterns are patent. Vascular: No hyperdense vessel. Atherosclerotic calcifications are present within the cavernous internal carotid and vertebral arteries. Skull: No acute fracture or focal lesion. Old left occipital skull fracture. Sinuses/Orbits: Paranasal sinuses and mastoid air cells are clear. Bilateral lens replacement. Bilateral scleral buckle. Otherwise the orbits are unremarkable. Other: Partially visualized endotracheal tube. IMPRESSION: No acute intracranial abnormality. Electronically Signed   By: Morgane  Naveau M.D.   On: 09/14/2023 22:36   DG Abd Portable 1 View Result Date: 09/14/2023 CLINICAL DATA:  Enteric catheter placement EXAM: PORTABLE ABDOMEN - 1 VIEW COMPARISON:  None Available. FINDINGS: Frontal view of the lower chest and upper abdomen demonstrates enteric catheter passing below diaphragm tip projecting over gastric body. Interstitial and ground-glass opacities throughout the lungs consistent with edema. Unremarkable bowel gas pattern. IMPRESSION: 1. Enteric catheter tip projects over gastric body. Electronically Signed   By: Bobbye Burrow M.D.   On: 09/14/2023 20:55   DG Chest Port 1 View Result Date: 09/14/2023 CLINICAL DATA:  Intubated, CHF, tachypnea EXAM: PORTABLE CHEST 1 VIEW COMPARISON:  09/14/2023 FINDINGS: Single frontal view of the chest demonstrates endotracheal tube overlying tracheal air column, tip of proximally 2 cm above carina. Dual lead pacemaker/AICD unchanged. Cardiac silhouette remains mildly enlarged. Stable interstitial and ground-glass opacities throughout  the lungs. No large effusion or pneumothorax. No acute bony abnormalities. IMPRESSION: 1. No complication after intubation. 2. Stable findings of congestive heart failure. Electronically Signed   By: Bobbye Burrow M.D.   On: 09/14/2023 20:55   DG Chest Port 1 View Result Date: 09/14/2023 CLINICAL DATA:  Tachypnea.  CHF EXAM: PORTABLE CHEST 1 VIEW COMPARISON:  X-ray 01/14/2022 and older FINDINGS: Left upper chest battery pack for defibrillator with leads along the right side of the heart. Stable cardiopericardial silhouette. Tortuous aorta. Increasing vascular congestion and component of possible edema. No pneumothorax or effusion. No consolidation. Degenerative changes along the spine. Overlapping cardiac leads. IMPRESSION: Increasing vascular congestion and interstitial changes, possible edema. Defibrillator. Electronically  Signed   By: Adrianna Horde M.D.   On: 09/14/2023 18:20    Labs:  CBC: Recent Labs    09/17/23 0523 09/18/23 0430 09/19/23 0409 09/21/23 0926  WBC 9.4 9.3 6.9 6.4  HGB 13.1 12.5* 12.3* 11.8*  HCT 39.4 36.9* 38.1* 36.1*  PLT 170 173 179 235    COAGS: Recent Labs    09/15/23 0244  INR 1.2  APTT 29    BMP: Recent Labs    09/18/23 1348 09/19/23 0409 09/20/23 0437 09/21/23 0458  NA 144 143 149* 146*  K 3.9 3.8 3.3* 3.3*  CL 105 104 103 98  CO2 29 29 35* 38*  GLUCOSE 160* 157* 164* 159*  BUN 43* 46* 42* 42*  CALCIUM  7.2* 7.5* 7.6* 7.8*  CREATININE 1.03 1.02 0.89 0.88  GFRNONAA >60 >60 >60 >60    LIVER FUNCTION TESTS: Recent Labs    09/01/23 1202 09/14/23 1835 09/15/23 0308 09/16/23 0359  BILITOT 0.8 0.9 0.8 0.5  AST 20 25 76* 59*  ALT 18 24 26 26   ALKPHOS 54 55 40 34*  PROT 6.7 7.2 5.8* 5.5*  ALBUMIN 3.7 4.0 3.1* 2.8*    TUMOR MARKERS: No results for input(s): "AFPTM", "CEA", "CA199", "CHROMGRNA" in the last 8760 hours.  Assessment and Plan:  Poor oral intake: Blake [name] is a 73 y.o. male with a history of severe TBI and recent  URI infection now with bilateral pneumonia Interventional Radiology department for an image-guided gastrostomy tube placement with Dr. Irine Manning on 09/22/23. Procedure to be performed under moderate sedation.  -Discussed plan with wife to move forward with G-tube placement; she is in agreement -VSS; Labs within appropriate limits -Patient scheduled for tracheostomy tomorrow -Plan for gastrostomy tube placement with Dr. Irine Manning  Risks and benefits image guided gastrostomy tube placement was discussed with the patient's wife including, but not limited to the need for a barium enema during the procedure, bleeding, infection, peritonitis and/or damage to adjacent structures.  All of the wife's questions were answered, patient is agreeable to proceed.  Consent signed and in chart.   Thank you for allowing our service to participate in Blake [name] 's care.    Electronically Signed: Damari Suastegui M Aarsh Fristoe, PA-C   09/21/2023, 2:13 PM     I spent a total of 40 Minutes  in face to face in clinical consultation, greater than 50% of which was counseling/coordinating care for gastrostomy tube placement.

## 2023-09-21 NOTE — Progress Notes (Signed)
 Foley catheter removed per protocol.

## 2023-09-21 NOTE — Progress Notes (Addendum)
 Advanced Heart Failure Rounding Note  Cardiologist: None  Chief Complaint: Acute hypoxic respiratory failure Subjective:    Coox 74%. CVP 7. On amio 30/hr. Weight down ~10lbs from admission.  Hypernatremic, now getting FWF.   Sedated/Intubated.  Objective:    Weight Range: 83.3 kg Body mass index is 29.66 kg/m.   Vital Signs:   Temp:  [98.2 F (36.8 C)-100.6 F (38.1 C)] 98.2 F (36.8 C) (06/02 0600) Pulse Rate:  [65-97] 65 (06/02 0600) Resp:  [11-24] 21 (06/02 0600) BP: (93-171)/(63-149) 103/65 (06/02 0600) SpO2:  [90 %-98 %] 95 % (06/02 0747) FiO2 (%):  [45 %] 45 % (06/02 0747) Weight:  [83.3 kg] 83.3 kg (06/02 0500) Last BM Date : 09/20/23  Weight change: Filed Weights   09/19/23 0500 09/20/23 0500 09/21/23 0500  Weight: 86.7 kg 84.6 kg 83.3 kg   Intake/Output:  Intake/Output Summary (Last 24 hours) at 09/21/2023 0752 Last data filed at 09/21/2023 0600 Gross per 24 hour  Intake 3312.47 ml  Output 2160 ml  Net 1152.47 ml    Physical Exam    CVP 7 General: Critically-ill appearing. Cardiac: S1 and S2 present. No murmurs or rub. Resp: Lung sounds clear and equal B/L Extremities: Warm and dry.  No peripheral edema.  Neuro: Sedated Lines/Devices:  RIJ CVL, OGT, foley, ETT  Telemetry   SR in 60-70s (personally reviewed)  EKG   No new EKG to review  Labs    CBC Recent Labs    09/19/23 0409  WBC 6.9  NEUTROABS 5.6  HGB 12.3*  HCT 38.1*  MCV 97.4  PLT 179   Basic Metabolic Panel Recent Labs    16/10/96 0437 09/21/23 0458  NA 149* 146*  K 3.3* 3.3*  CL 103 98  CO2 35* 38*  GLUCOSE 164* 159*  BUN 42* 42*  CREATININE 0.89 0.88  CALCIUM  7.6* 7.8*  MG 2.7* 2.7*  PHOS 2.2* 3.4   BNP (last 3 results) Recent Labs    09/14/23 1835 09/17/23 0523  BNP 573.5* 968.1*   Fasting Lipid Panel Recent Labs    09/21/23 0458  TRIG 190*   Imaging   DG Chest Port 1 View Result Date: 09/20/2023 CLINICAL DATA:  73 year old male respiratory  failure, intubated. EXAM: PORTABLE CHEST 1 VIEW COMPARISON:  Portable chest yesterday and earlier. FINDINGS: Portable AP upright view at 0942 hours. Stable lines and tubes. Left chest pacer/resuscitation pads persist. Stable lung volumes and mediastinal contours. Stable left chest AICD. Stable ventilation since yesterday. No pneumothorax, pulmonary edema. Patchy left greater than right lung base opacity. Paucity of bowel gas in the upper abdomen. Stable visualized osseous structures. IMPRESSION: 1. Stable lines and tubes. 2. Stable ventilation since yesterday. Patchy left greater than right lung base opacity. Electronically Signed   By: Marlise Simpers M.D.   On: 09/20/2023 10:26    Medications:    Scheduled Medications:  aspirin   81 mg Per Tube Daily   atorvastatin   40 mg Per Tube Daily   Chlorhexidine  Gluconate Cloth  6 each Topical Daily   docusate  100 mg Per Tube BID   enoxaparin  (LOVENOX ) injection  40 mg Subcutaneous Q24H   feeding supplement (PROSource TF20)  60 mL Per Tube BID   free water   30 mL Per Tube Q4H   hydrocortisone  sod succinate (SOLU-CORTEF ) inj  50 mg Intravenous Q12H   ipratropium-albuterol   3 mL Nebulization TID   mouth rinse  15 mL Mouth Rinse Q2H   pantoprazole  (PROTONIX ) IV  40 mg Intravenous QHS   polyethylene glycol  17 g Per Tube BID   potassium chloride   40 mEq Per Tube Once   sodium chloride  flush  10-40 mL Intracatheter Q12H   valproic  acid  250 mg Per Tube TID    Infusions:  sodium chloride  Stopped (09/21/23 0507)   amiodarone  30 mg/hr (09/21/23 0600)   feeding supplement (VITAL AF 1.2 CAL) 50 mL/hr at 09/21/23 0600   norepinephrine  (LEVOPHED ) Adult infusion Stopped (09/18/23 1657)   piperacillin-tazobactam (ZOSYN)  IV 12.5 mL/hr at 09/21/23 0600   propofol  (DIPRIVAN ) infusion 50 mcg/kg/min (09/21/23 0600)    PRN Medications: sodium chloride , fentaNYL  (SUBLIMAZE ) injection, ipratropium-albuterol , naphazoline-glycerin , sodium chloride  flush  Patient Profile    ATZIN BUCHTA III is a 73 y.o. 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   Assessment/Plan   1. Acute hypoxemic respiratory failure: In setting of high fever, respiratory virus panel + for rhinovirus.  Suspect viral infection with secondary bacterial PNA (possible aspiration).  Also a significant component of pulmonary edema.  CXR much worse this morning when he was re-intubated.  Concern for ARDS based on appearance, but hope we can improve with diuresis.  - Hold diuresis, appears to be dry by weight and labs. CXR from yesterday with persistent opacities.  - Continue Zosyn per CCM  2. Shock: Resolved. Initially primarily septic shock/shock related to sedation (propofol ). Procalcitonin elevated at 3.43. Lactate has cleared. Off pressors.  - Continue abx as above.  - Stress dose steroids per CCM.   3. Acute on chronic systolic CHF: Ischemic cardiomyopathy.  Echo this admission with EF 35-40% with normal RV and moderate MR, this is consistent with prior echoes. Initially, Lasix  was held and he had volume rescuscitation. CXR concerning for pulmonary edema.  - Coox 74%. CVP 7.  - Weight down 9lbs from admission. High Na and Cl. Appears dry. Hold diuresis.  - GDMT limited by hypovolemia and soft BP  4. ID: Rhinovirus infection, suspect secondary bacterial PNA with sepsis, initial procal 3.43, down to 1.2 today. - Continue zosyn as above  5. CAD: NSTEMI with [place]-TnI up to [address].  He did have some chest pain at time of admission.  Echo was unchanged with EF 35-40%.  He was in shock initially and has developed volume overload, but TnI seems high for demand ischemia alone.  Cannot rule out ACS with plaque rupture.  - Continue ASA 81 + atorva 40 mg daily - Once he is extubated and stabilized, would favor coronary angiography.    6. VT: noted on 5/30 during SBT. Started on amio gtt. No further VT. - Transition to PO amio 400 mg daily.   7. Hypokalemia - K 3.3. Replace  8. Hypernatremia, hyperchloremia - hold diuresis. - FWF per CCM   Length of Stay: 7  CRITICAL CARE Performed by: [place] Lee  Total critical care time: 14 minutes  Critical care time was exclusive of separately billable procedures and treating other patients.  Critical care was necessary to treat or prevent imminent or life-threatening deterioration.  Critical care was time spent personally by me on the following activities: development of treatment plan with patient and/or surrogate as well as nursing, discussions with consultants, evaluation of patient's response to treatment, examination of patient, obtaining history from patient or surrogate, ordering and performing treatments and interventions,  ordering and review of laboratory studies, ordering and review of radiographic studies, pulse oximetry and re-evaluation of patient's condition.  [place] Lee, NP  09/21/2023, 7:52 AM  Advanced Heart Failure Team Pager [phone] (M-F; 7a - 5p)  Please contact [facility] for night-coverage after hours (5p -7a ) and weekends on [email or web address]   Agree with abvoe.   Remains intubated and sedated. Not arousing on my exam.   Now in NSR. CO-ox ox. CVP 6-7 (done personally)  Weight down 10 pounds since admit. IV lasix  on hold due to metabolic abnormalities and getting FWF.  CXR clearing some but still with bilateral opacities. Personally reviewed  General:  Intubated sedated HEENT:l + ETT Neck: supple. JVP 6-7  Cor: PMI nondisplaced. Regular rate & rhythm. No rubs, gallops or murmurs. Lungs: clear Abdomen: soft, nontender, nondistended. No hepatosplenomegaly. No bruits or masses. Good bowel sounds. Extremities: no cyanosis, clubbing, rash, edema Neuro: sedated on vent  Hemodynamically stabilized but remains vent dependent. I don't think we can  optimize him much more from cardiac perspective. If unable to liberate from vent then will need to decide on trach.   Will continue to hold lasix  today. Supp K. Titrate GDMT as tolerated.   No role for invasive w/u at this point as EF stable and unable to change management.   CRITICAL CARE Performed by: Jules Oar  Total critical care time: 42 minutes  Critical care time was exclusive of separately billable procedures and treating other patients.  Critical care was necessary to treat or prevent imminent or life-threatening deterioration.  Critical care was time spent personally by me (independent of midlevel providers or residents) on the following activities: development of treatment plan with patient and/or surrogate as well as nursing, discussions with consultants, evaluation of patient's response to treatment, examination of patient, obtaining history from patient or surrogate, ordering and performing treatments and interventions, ordering and review of laboratory studies, ordering and review of radiographic studies, pulse oximetry and re-evaluation of patient's condition.   Jules Oar, MD  8:21 PM

## 2023-09-21 NOTE — Progress Notes (Signed)
 Pt's gold colored wedding ring removed from finger due to swelling. Placed in sterile specimen cup with pt's name on it and placed on bookshelf.

## 2023-09-22 ENCOUNTER — Inpatient Hospital Stay: Admitting: Radiology

## 2023-09-22 DIAGNOSIS — J9601 Acute respiratory failure with hypoxia: Secondary | ICD-10-CM | POA: Diagnosis not present

## 2023-09-22 DIAGNOSIS — J9602 Acute respiratory failure with hypercapnia: Secondary | ICD-10-CM | POA: Diagnosis not present

## 2023-09-22 DIAGNOSIS — I5021 Acute systolic (congestive) heart failure: Secondary | ICD-10-CM | POA: Diagnosis not present

## 2023-09-22 DIAGNOSIS — A419 Sepsis, unspecified organism: Secondary | ICD-10-CM | POA: Diagnosis not present

## 2023-09-22 HISTORY — PX: IR GASTROSTOMY TUBE MOD SED: IMG625

## 2023-09-22 LAB — BASIC METABOLIC PANEL WITH GFR
Anion gap: 10 (ref 5–15)
BUN: 39 mg/dL — ABNORMAL HIGH (ref 8–23)
CO2: 36 mmol/L — ABNORMAL HIGH (ref 22–32)
Calcium: 7.9 mg/dL — ABNORMAL LOW (ref 8.9–10.3)
Chloride: 101 mmol/L (ref 98–111)
Creatinine, Ser: 0.8 mg/dL (ref 0.61–1.24)
GFR, Estimated: >60 mL/min (ref >60)
Glucose, Bld: 104 mg/dL — ABNORMAL HIGH (ref 70–99)
Potassium: 3.5 mmol/L (ref 3.5–5.1)
Sodium: 147 mmol/L — ABNORMAL HIGH (ref 135–145)

## 2023-09-22 LAB — CBC
HCT: 38.6 % — ABNORMAL LOW (ref 39.0–52.0)
Hemoglobin: 12.3 g/dL — ABNORMAL LOW (ref 13.0–17.0)
MCH: 31.1 pg (ref 26.0–34.0)
MCHC: 31.9 g/dL (ref 30.0–36.0)
MCV: 97.5 fL (ref 80.0–100.0)
Platelets: 278 10*3/uL (ref 150–400)
RBC: 3.96 MIL/uL — ABNORMAL LOW (ref 4.22–5.81)
RDW: 14 % (ref 11.5–15.5)
WBC: 7.9 10*3/uL (ref 4.0–10.5)
nRBC: 0 % (ref 0.0–0.2)

## 2023-09-22 LAB — COOXEMETRY PANEL
Carboxyhemoglobin: 1.4 % (ref 0.5–1.5)
Methemoglobin: <0.7 % (ref 0.0–1.5)
O2 Saturation: 72.1 %
Total hemoglobin: 13 g/dL (ref 12.0–16.0)
Total oxygen content: 70.9 %

## 2023-09-22 LAB — GLUCOSE, CAPILLARY
Glucose-Capillary: 100 mg/dL — ABNORMAL HIGH (ref 70–99)
Glucose-Capillary: 104 mg/dL — ABNORMAL HIGH (ref 70–99)
Glucose-Capillary: 112 mg/dL — ABNORMAL HIGH (ref 70–99)
Glucose-Capillary: 90 mg/dL (ref 70–99)
Glucose-Capillary: 93 mg/dL (ref 70–99)
Glucose-Capillary: 95 mg/dL (ref 70–99)

## 2023-09-22 MED ORDER — LIDOCAINE HCL 1 % IJ SOLN
INTRAMUSCULAR | Status: AC
  Filled 2023-09-22: qty 20

## 2023-09-22 MED ORDER — CEFAZOLIN SODIUM-DEXTROSE 2-4 GM/100ML-% IV SOLN
INTRAVENOUS | Status: AC
  Filled 2023-09-22: qty 100

## 2023-09-22 MED ORDER — LIDOCAINE HCL 1 % IJ SOLN
10.0000 mL | Freq: Once | INTRAMUSCULAR | Status: AC
  Administered 2023-09-22: 20 mL via INTRADERMAL

## 2023-09-22 MED ORDER — STERILE WATER FOR INJECTION IJ SOLN
INTRAMUSCULAR | Status: AC
  Filled 2023-09-22: qty 10

## 2023-09-22 MED ORDER — METOPROLOL SUCCINATE ER 50 MG PO TB24: 25.0000 mg | ORAL_TABLET | Freq: Every day | ORAL | Status: DC

## 2023-09-22 MED ORDER — ALTEPLASE 2 MG IJ SOLR
2.0000 mg | Freq: Once | INTRAMUSCULAR | Status: AC
  Administered 2023-09-22: 2 mg
  Filled 2023-09-22: qty 2

## 2023-09-22 MED ORDER — POTASSIUM CHLORIDE 20 MEQ PO PACK: 40.0000 meq | PACK | ORAL | Status: DC

## 2023-09-22 MED ORDER — FUROSEMIDE 20 MG PO TABS: 40.0000 mg | ORAL_TABLET | ORAL | Status: DC

## 2023-09-22 MED ORDER — GLUCAGON HCL RDNA (DIAGNOSTIC) 1 MG IJ SOLR
INTRAMUSCULAR | Status: AC
Start: 2023-09-22 — End: ?
  Filled 2023-09-22: qty 1

## 2023-09-22 MED ORDER — STERILE WATER FOR INJECTION IJ SOLN
10.0000 mL | Freq: Once | INTRAMUSCULAR | Status: AC
  Administered 2023-09-22: 10 mL via GASTROSTOMY

## 2023-09-22 MED ORDER — IOHEXOL 300 MG/ML  SOLN
15.0000 mL | Freq: Once | INTRAMUSCULAR | Status: AC | PRN
  Administered 2023-09-22: 15 mL

## 2023-09-22 MED ORDER — GLUCAGON HCL RDNA (DIAGNOSTIC) 1 MG IJ SOLR
INTRAMUSCULAR | Status: AC | PRN
Start: 2023-09-22 — End: 2023-09-22
  Administered 2023-09-22: 1 mg via INTRAVENOUS

## 2023-09-22 MED ORDER — SPIRONOLACTONE 12.5 MG HALF TABLET
12.5000 mg | ORAL_TABLET | Freq: Every day | ORAL | Status: DC
  Administered 2023-09-22 – 2023-09-23 (×3): 12.5 mg via ORAL
  Filled 2023-09-22 (×2): qty 1

## 2023-09-22 MED ORDER — CEFAZOLIN SODIUM-DEXTROSE 2-4 GM/100ML-% IV SOLN
2.0000 g | Freq: Once | INTRAVENOUS | Status: AC
  Administered 2023-09-22: 2 g via INTRAVENOUS
  Filled 2023-09-22: qty 100

## 2023-09-22 NOTE — Progress Notes (Signed)
 No urine output this am. Bladder scanned pt yield >445 cc. Dr. Auston Left notified. Verbal orders given for foley insertion.

## 2023-09-22 NOTE — Procedures (Signed)
 Interventional Radiology Procedure:   Indications: Acute respiratory failure and on vent.  Procedure: Placement of gastrostomy tube  Findings: 2 gastropexy T-fasteners placed.  18 Fr balloon retention gastrostomy tube placed.    Complications: None     EBL: Minimal  Plan: May start using in 4 hours  Kawanda Drumheller R. Julietta Ogren, MD  Pager: [phone]

## 2023-09-22 NOTE — Plan of Care (Signed)
  Problem: Clinical Measurements: Goal: Ability to maintain clinical measurements within normal limits will improve Outcome: Progressing Goal: Respiratory complications will improve Outcome: Not Progressing Goal: Cardiovascular complication will be avoided Outcome: Progressing   Problem: Activity: Goal: Risk for activity intolerance will decrease Outcome: Progressing   Problem: Nutrition: Goal: Adequate nutrition will be maintained Outcome: Progressing   Problem: Elimination: Goal: Will not experience complications related to bowel motility Outcome: Progressing Goal: Will not experience complications related to urinary retention Outcome: Not Progressing

## 2023-09-22 NOTE — Progress Notes (Signed)
 Pt back from G tube placement. VSS at this time.

## 2023-09-22 NOTE — Plan of Care (Signed)
  Problem: Education: Goal: Knowledge of General Education information will improve Description: Including pain rating scale, medication(s)/side effects and non-pharmacologic comfort measures Outcome: Progressing   Problem: Clinical Measurements: Goal: Ability to maintain clinical measurements within normal limits will improve Outcome: Progressing Goal: Will remain free from infection Outcome: Progressing Goal: Diagnostic test results will improve Outcome: Progressing   Problem: Clinical Measurements: Goal: Respiratory complications will improve Outcome: Not Progressing

## 2023-09-22 NOTE — Progress Notes (Signed)
 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   Per ed reports, and patient's wife who is at the bedside, Patient was seen at the urgent care for chief complaints of chills, body aches, intermittently and nonproductive coughing fits. He was diagnosed with acute upper respiratory infection and was sent home with promethazine-dextromethorphan and cefdinir (OMNICEF). [name] report that he took the prescription and went to bed. Later he woke up altered and delusional and c/o CP and SOB. He was noted to be sweating profusely with abnormal body temp per wife.   ED Course: Initial vital signs showed HR >180 beats/minute, BP >160 mm Hg, the RR 30s-40s breaths/minute, and the oxygen saturation 80s% on NRB and a temperature of 103.19F (39.6C).    Pertinent Labs/Diagnostics Findings: Na+/ K+:138/3.9  Glucose:127 CO2 21 WBC: unremarkable Lactic acid: 1.5~2.6 CK: COVID PCR: Negative troponin:106~7453   BNP:573.5  VBG: pO2 pend; pCO2 37; pH 7.49;  HCO3 28.2, %O2 Sat 43.7.  CXR> CTH> CTA Chest> CT Abd/pelvis>see report   Patient's agitation and restlessness worsen with HR up in the 170s to 180s.  2 mg of Ativan  was trialed but patient did not  calm down. Given worsening symptoms and high risk for decompensation, patient intubated for airway protection. He was started on broad-spectrum antibiotics Ampicillin , vanc, Ceftriaxone  and Acyclovir  due to unclear source for possible infection and cover broadly to include treatment for meningitis. PCCM consulted for admission.  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 and fell off, then he got back onto the horse and fell again on asphalt), HFrEF, ischemic cardiomyopathy, Hyperlipidemia, SVT, CAD, Depression, GERD, Hearing loss of both ears, MI, CVA, Hypertension, Hypotestosteronemia, ICD in place [facility] [employer] [id number] DEFIBRILLATOR, RESONATE EL DR DF4 S/N: [phone] implanted 11/21/2021), and OSA on CPAP     Significant Hospital Events: Including procedures, antibiotic start and stop dates in addition to other pertinent events   5/26: Admitted to ICU with acute altered mental status and respiratory failure in the setting of suspected NMS versus sepsis requiring intubation 5/27: fever improved, continued on vasopressors 5/28: extubated in the afternoon. Respiratory failure overnight, re-intubated 5/29: vented, increased oxygen requirements. Family at the bedside. Heart failure consult placed 5/30: vent requirements improved, diuresing, sedated. Blake [name] with SBT 5/31: remains vented, WUA this AM 6/01: vented, sedated, afebrile 6/2 remains on vent 6/3 remains on vent, TRACH PEG TUBE pending  Interim History / Subjective:  Remains critically ill Remains intubated Requires VENT support for survival  Vent Mode: PRVC FiO2 (%):  [40 %-45 %] 40 % Set Rate:  [15 bmp] 15 bmp Vt Set:  [500 mL] 500 mL PEEP:  [5 cmH20] 5 cmH20 Plateau  Pressure:  [10 cmH20-16 cmH20] 10 cmH20   Objective    Blood pressure 123/77, pulse 66, temperature  99 F (37.2 C), temperature source Axillary, resp. rate 15, height 5' 5.98" (1.676 m), weight 83.4 kg, SpO2 94%. CVP:  [0 mmHg-16 mmHg] 10 mmHg  Vent Mode: PRVC FiO2 (%):  [40 %-45 %] 40 % Set Rate:  [15 bmp] 15 bmp Vt Set:  [500 mL] 500 mL PEEP:  [5 cmH20] 5 cmH20 Plateau Pressure:  [10 cmH20-16 cmH20] 10 cmH20   Intake/Output Summary (Last 24 hours) at 09/22/2023 0731 Last data filed at 09/22/2023 0550 Gross per 24 hour  Intake 2245.02 ml  Output 1170 ml  Net 1075.02 ml   Filed Weights   09/20/23 0500 09/21/23 0500 09/22/23 0550  Weight: 84.6 kg 83.3 kg 83.4 kg     REVIEW OF SYSTEMS  PATIENT IS UNABLE TO PROVIDE COMPLETE REVIEW OF SYSTEMS DUE TO SEVERE CRITICAL ILLNESS   PHYSICAL EXAMINATION:  GENERAL:critically ill appearing, +resp distress EYES: Pupils equal, round, reactive to light.  No scleral icterus.  MOUTH: Moist mucosal membrane. INTUBATED NECK: Supple.  PULMONARY: Lungs clear to auscultation CARDIOVASCULAR: S1 and S2.  Regular rate and rhythm GASTROINTESTINAL: Soft, nontender, -distended. Positive bowel sounds.  MUSCULOSKELETAL: No swelling, clubbing, or edema.  NEUROLOGIC: obtunded,sedated SKIN:normal, warm to touch, Capillary refill delayed  Pulses present bilaterally   Assessment and Plan   73 year old male presenting with a few days' history of shortness of breath, viral symptoms, and encephalopathy. He was intubated on presentation to the ED due to agitation for airway protection and admitted to the ICU for further management, Diagnosis of b/l Pneumonia with failure to wean from ventilator, failed one trial of extubation  Severe ACUTE Hypoxic and Hypercapnic Respiratory Failure -continue Mechanical Ventilator support -Wean Fio2 and PEEP as tolerated -VAP/VENT bundle implementation - Wean PEEP & FiO2 as tolerated, maintain SpO2 > 88% - Head of bed elevated 30 degrees, VAP protocol in place - Plateau pressures less than 30 cm H20  - Intermittent chest  x-ray & ABG PRN - Ensure adequate pulmonary hygiene  Holding SBT today per wife's request(6/1) Plan for [facility] 6/4  SEPTIC/Cardiogenic  shock SOURCE-Pneumonia/cardiac failure -use vasopressors to keep MAP>65 as needed -follow ABG and LA as needed -follow up cultures -emperic ABX   INFECTIOUS DISEASE RHINO-Viral pneumonia with superimposed bacterial pneumonia Aspiration Pneumonia  Community Acquired Pneumonia -continue antibiotics as prescribed -follow up cultures Procal is improved.  ACUTE SYSTOLIC CARDIAC FAILURE- EF 35% Ventricular Tachycardia- (5/30) while on SBT for which an amiodarone  gtt was started.  -Lasix  as tolerated -follow up cardiac enzymes as indicated -follow up cardiology recs Type II NSTEMI  CAD s/p PCI to LAD   NEUROLOGY ACUTE METABOLIC ENCEPHALOPATHY Fronto-temporal Dementia -need for sedation -Goal RASS -2 to -3 Started Seroquel  RENAL -continue Foley Catheter-assess need -Avoid nephrotoxic agents -Follow urine output, BMP -Ensure adequate renal perfusion, optimize oxygenation -Renal dose medications   Intake/Output Summary (Last 24 hours) at 09/22/2023 0733 Last data filed at 09/22/2023 0550 Gross per 24 hour  Intake 2245.02 ml  Output 1170 ml  Net 1075.02 ml  '    Latest Ref Rng & Units 09/22/2023    4:18 AM 09/21/2023    4:58 AM 09/20/2023    4:37 AM  BMP  Glucose 70 - 99 mg/dL 409  811  914   BUN 8 - 23 mg/dL 39  42  42   Creatinine 0.61 - 1.24 mg/dL 7.82  9.56  0.89   Sodium 135 - 145 mmol/L 147  146  149   Potassium 3.5 - 5.1 mmol/L 3.5  3.3  3.3   Chloride 98 - 111 mmol/L 101  98  103   CO2 22 - 32 mmol/L 36  38  35   Calcium  8.9 - 10.3 mg/dL 7.9  7.8  7.6     ENDO - ICU hypoglycemic\Hyperglycemia protocol -check FSBS per protocol   GI GI PROPHYLAXIS as indicated NUTRITIONAL STATUS DIET-->TF's as tolerated Constipation protocol as indicated   ELECTROLYTES -follow labs as needed -replace as needed -pharmacy consultation  and following  RESTRICTIVE TRANSFUSION PROTOCOL TRANSFUSION  IF HGB<7  or ACTIVE BLEEDING OR DX of ACUTE CORONARY SYNDROMES      Best Practice (right click and "Reselect all SmartList Selections" daily)   Diet/type: tubefeeds DVT prophylaxis LMWH Pressure ulcer(s): N/A GI prophylaxis: PPI Lines: Central line and Arterial Line Foley:  Yes, and it is still needed Code Status:  full code Last date of multidisciplinary goals of care discussion [09/20/2023]  Labs   CBC: Recent Labs  Lab 09/17/23 0523 09/18/23 0430 09/19/23 0409 09/21/23 0926 09/22/23 0418  WBC 9.4 9.3 6.9 6.4 7.9  NEUTROABS 7.6 7.6 5.6  --   --   HGB 13.1 12.5* 12.3* 11.8* 12.3*  HCT 39.4 36.9* 38.1* 36.1* 38.6*  MCV 96.3 95.1 97.4 97.3 97.5  PLT 170 173 179 235 278    Basic Metabolic Panel: Recent Labs  Lab 09/17/23 0523 09/17/23 1006 09/17/23 2058 09/18/23 0430 09/18/23 1348 09/19/23 0409 09/20/23 0437 09/21/23 0458 09/22/23 0418  NA 142   < >  --  143 144 143 149* 146* 147*  K 5.3*   < > 3.5 4.2 3.9 3.8 3.3* 3.3* 3.5  CL 110   < >  --  106 105 104 103 98 101  CO2 24   < >  --  25 29 29  35* 38* 36*  GLUCOSE 147*   < >  --  145* 160* 157* 164* 159* 104*  BUN 32*   < >  --  38* 43* 46* 42* 42* 39*  CREATININE 1.14   < >  --  1.09 1.03 1.02 0.89 0.88 0.80  CALCIUM  8.1*   < >  --  7.6* 7.2* 7.5* 7.6* 7.8* 7.9*  MG 2.7*  --  2.3 2.6* 2.3  --  2.7* 2.7*  --   PHOS 2.6  --   --  3.4  --  2.5 2.2* 3.4  --    < > = values in this interval not displayed.   GFR: Estimated Creatinine Clearance: 80.8 mL/min (by C-G formula based on SCr of 0.8 mg/dL). Recent Labs  Lab 09/18/23 0429 09/18/23 0430 09/19/23 0409 09/20/23 0437 09/21/23 0926 09/22/23 0418  PROCALCITON 2.99  --  1.78 1.20  --   --   WBC  --  9.3 6.9  --  6.4 7.9    Liver Function Tests: Recent Labs  Lab 09/16/23 0359  AST 59*  ALT 26  ALKPHOS 34*  BILITOT 0.5  PROT 5.5*  ALBUMIN 2.8*   No results for input(s): "LIPASE",  "AMYLASE" in the last 168 hours. No results for input(s): "AMMONIA" in the last 168 hours.  ABG    Component Value Date/Time   PHART 7.44 09/19/2023 0943   PCO2ART 51 (H) 09/19/2023 0943   PO2ART 84 09/19/2023 0943   HCO3 34.6 (H) 09/19/2023 0943   ACIDBASEDEF 0.2 09/17/2023 6578  O2SAT 72.1 09/22/2023 0418     Coagulation Profile: Recent Labs  Lab 09/21/23 1437  INR 1.1    Cardiac Enzymes: Recent Labs  Lab 09/17/23 0523  CKTOTAL 198    HbA1C: No results found for: "HGBA1C"  CBG: Recent Labs  Lab 09/21/23 1118 09/21/23 1538 09/21/23 1949 09/21/23 2323 09/22/23 0334  GLUCAP 110* 108* 82 142* 100*    Review of Systems:   N/A  Past Medical History:  He,  has a past medical history of 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), Asperger's syndrome, 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), CAD (coronary artery disease) (09/21/2013), 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), Chronic left shoulder pain (04/03/2016), 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), Cognitive deficit as late effect of traumatic brain injury (HCC) (03/06/2016), Coronary artery disease, Dementia (HCC), Depression, Disorder of male genital organs (07/11/2013), Dyspnea, Encounter for long-term (current) use of medications (11/21/2014), Erectile dysfunction (07/11/2013), Executive function deficit (03/06/2016), GERD (gastroesophageal reflux disease), Headache, Hearing loss, Hyperlipidemia, Hypertension, Hypogonadism, male (07/11/2013), Hypotestosteronism (07/11/2013), Injury of frontal lobe (HCC), Major depression, recurrent, chronic (HCC) (03/06/2016), Mild cognitive impairment, Mixed hyperlipidemia (02/02/2014), Myocardial infarction (HCC) (08/2013), OCD (obsessive compulsive disorder), Other retinal detachments (04/20/2015), Other specified disorder of male genital organs(608.89) (07/11/2013), Prostatic hypertrophy, Pseudophakia of both eyes (04/20/2015), Raynaud's disease, Reduced libido (07/11/2013),  Repeated falls, Scoliosis, Stroke (HCC) (2/16), and Synovitis.   Surgical History:   Past Surgical History:  Procedure Laterality Date   BACK SURGERY  2011   rods and screws   CLOSED REDUCTION NASAL FRACTURE Bilateral 06/11/2018   Procedure: CLOSED REDUCTION NASAL FRACTURE;  Surgeon: [name], MD;  Location: [facility] ORS;  Service: ENT;  Laterality: Bilateral;   COLONOSCOPY  2015   CORONARY ANGIOPLASTY     2015 stent   CORONARY STENT INTERVENTION N/A 06/08/2019   Procedure: CORONARY STENT INTERVENTION;  Surgeon: Antonette Batters, MD;  Location: [facility] INVASIVE CV [facility];  Service: Cardiovascular;  Laterality: N/A;   CYSTOSCOPY  1984   EYE SURGERY Bilateral 2005,2006,2009   detached retina, cataract   FOOT ARTHRODESIS Left 05/30/2015   Procedure: ARTHRODESIS FOOT STJ LT FOOT;  Surgeon: Anell Baptist, DPM;  Location: [facility];  Service: Podiatry;  Laterality: Left;  WITH POPLITEAL BLOCK   FOOT SURGERY Left    HERNIA REPAIR Right 1969   inguinal   LEFT HEART CATH AND CORONARY ANGIOGRAPHY Left 06/08/2019   Procedure: LEFT HEART CATH AND CORONARY ANGIOGRAPHY;  Surgeon: Antonette Batters, MD;  Location: [facility] INVASIVE CV LAB;  Service: Cardiovascular;  Laterality: Left;   SEPTOPLASTY Bilateral 06/11/2018   Procedure: SEPTOPLASTY;  Surgeon: [name], MD;  Location: [facility] ORS;  Service: ENT;  Laterality: Bilateral;   SHOULDER SURGERY Right 2004   skull surgery     TOE SURGERY Right 2009   TONSILLECTOMY  2008     Social History:   reports that he has never smoked. He has never used smokeless tobacco. He reports that he does not drink alcohol and does not use drugs.   Family History:  His family history includes Asperger's syndrome in his mother.   Allergies Allergies  Allergen Reactions   Promethazine Other (See Comments)    Severe neuroleptic malignant syndrome requiring intubation   Mirtazapine Other (See Comments)    Other reaction(s): Other (See  Comments) Irritability/anger, increased appetite, worsening depression Irritability/anger, increased appetite, worsening depression      Home Medications  Prior to Admission medications   Medication Sig Start Date End Date Taking? Authorizing Provider  acetaminophen  (TYLENOL ) 500 MG  tablet Take 1,000 mg by mouth every 6 (six) hours as needed for mild pain or moderate pain.   Yes [provider]  aspirin  EC 81 MG tablet Take 81 mg by mouth daily.   Yes [provider]  B Complex-C (SUPER B COMPLEX PO) Take 1 tablet by mouth daily.   Yes [provider]  Calcium  Carbonate-Vitamin D  600-200 MG-UNIT CAPS Take 1 tablet by mouth 2 (two) times daily.    Yes [provider]  carvedilol  (COREG ) 3.125 MG tablet Take 3.125 mg by mouth daily.  11/03/18  Yes [provider]  cefdinir (OMNICEF) 300 MG capsule Take 300 mg by mouth 2 (two) times daily. 09/14/23 09/21/23 Yes [provider]  clindamycin  (CLEOCIN  T) 1 % lotion Apply 1 application topically 2 (two) times daily as needed.    Yes [provider]  clopidogrel  (PLAVIX ) 75 MG tablet Take 75 mg by mouth daily. 09/27/18  Yes [provider]  divalproex  (DEPAKOTE  ER) 250 MG 24 hr tablet Total of 750 mg daily. Take along with 500 mg tab 08/24/23  Yes Hisada, Ivan Marion, MD  divalproex  (DEPAKOTE  ER) 500 MG 24 hr tablet Take 1 tablet (500 mg total) by mouth daily. 08/13/23 10/12/23 Yes Todd Fossa, MD  DULoxetine  (CYMBALTA ) 60 MG capsule Take 60 mg by mouth 2 (two) times daily.  11/17/18  Yes [provider]  FIBER PO Take 1 tablet by mouth 2 (two) times daily.    Yes [provider]  furosemide  (LASIX ) 20 MG tablet Take 20 mg by mouth daily. 11/03/18  Yes [provider]  latanoprost (XALATAN) 0.005 % ophthalmic solution Place 1 drop into the left eye daily. 11/06/21  Yes [provider]  losartan (COZAAR) 100 MG tablet Take 100 mg by mouth daily.   Yes [provider]  Melatonin 5 MG TABS Take 10 mg by mouth at bedtime. 30 min before bed   Yes [provider]  omeprazole (PRILOSEC) 20 MG capsule Take 20 mg by mouth daily.   Yes [provider]  Probiotic Product (PROBIOTIC DAILY PO) Take 1 tablet by mouth daily. Senior   Yes [provider]  silodosin  (RAPAFLO ) 8 MG CAPS capsule TAKE 1 CAPSULE BY MOUTH DAILY WITH BREAKFAST Patient taking differently: Take 8 mg by mouth daily. 03/16/23  Yes Stoioff, Kizzie Perks, MD  sodium chloride  (OCEAN) 0.65 % SOLN nasal spray Place 2 sprays into both nostrils as needed for congestion.   Yes [provider]  tadalafil  (CIALIS ) 5 MG tablet Take 1 tablet by mouth once daily 08/03/23  Yes Stoioff, Scott C, MD  SYRINGE-NEEDLE, DISP, 3 ML (B-D 3CC LUER-LOK SYR 22GX1-1/2) 22G X 1-1/2" 3 ML MISC USE AS DIRECTED WITH TESTOSTERONE  08/24/23   Stoioff, Kizzie Perks, MD  testosterone  cypionate (DEPOTESTOSTERONE CYPIONATE) 200 MG/ML injection Inject 0.3 mLs (60 mg total) into the muscle once a week. DISCARD REMAINING AS THESE ARE SINGLE USE VIALS. Patient taking differently: Inject 60 mg into the muscle every 14 (fourteen) days. EVERY OTHER SATURDAY. DISCARD REMAINING AS THESE ARE SINGLE USE VIALS. 06/16/23   Geraline Knapp, MD       DVT/GI PRX  assessed I Assessed the need for Labs I Assessed the need for Foley I Assessed the need for Central Venous Line Family Discussion when available I Assessed the need for Mobilization I made an Assessment of medications to be adjusted accordingly Safety Risk assessment completed  CASE DISCUSSED IN MULTIDISCIPLINARY ROUNDS WITH ICU TEAM  Critical Care Time devoted to patient care services described in this note is 55 minutes.  Critical care was necessary to treat /prevent imminent and life-threatening deterioration. Overall, patient is critically ill, prognosis is guarded.  Patient with Multiorgan failure and at high risk for cardiac arrest and  death.    Lady Pier, M.D.  [facility]  Medical Director [facility] Medical Director [facility] Cardio-Pulmonary Department

## 2023-09-22 NOTE — Anesthesia Preprocedure Evaluation (Addendum)
 Anesthesia Evaluation  Patient identified by MRN, date of birth, ID band Patient awake    Reviewed: Allergy & Precautions, H&P , NPO status , Patient's Chart, lab work & pertinent test results  Airway Mallampati: Intubated  TM Distance: >3 FB Neck ROM: full    Dental  (+) Chipped   Pulmonary sleep apnea , pneumonia Acute hypoxemic respiratory failure   Pulmonary exam normal        Cardiovascular hypertension, + CAD, + Past MI, + Cardiac Stents (to LAD) and +CHF (Acute on chronic HFrEF , Ischemic cardiomyopathy s/p ICD)  Normal cardiovascular exam+ dysrhythmias (# Nonsustained VT, Started on amio gtt. No further VT) + Cardiac Defibrillator [facility] Scientific [id number] DEFIBRILLATOR, RESONATE EL DR DF4 S/N: [phone] implanted 11/21/2021)   ACUTE SYSTOLIC CARDIAC FAILURE- EF 35% Ventricular Tachycardia- (5/30) while on SBT for which an amiodarone  gtt was started. Type II NSTEMI this admission- Completed 48 hrs of heparin  gtt for medical management of NSTEMI  Per cardiology recommendations: -Consider LHC when hemodynamically stable and extubated -No role for invasive w/u at this point as EF stable and unable to change management.   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    5/26: Admitted to ICU with acute altered mental status and respiratory failure in the setting of suspected NMS versus sepsis requiring intubation  5/27: fever improved, continued on vasopressors  5/28: extubated in the afternoon. Respiratory failure overnight, re-intubated  5/29: vented, increased oxygen requirements. Family at the bedside. Heart failure consult placed  5/30: vent requirements improved, diuresing, sedated. VCharise Companion with SBT  5/31: remains vented, WUA this AM  6/01: vented, sedated, afebrile  6/2 remains on vent  6/3 remains on vent, TRACH PEG TUBE pending    PER ICU: 1. Acute hypoxemic respiratory failure: In setting of high fever, respiratory virus panel + for rhinovirus.  Suspect viral infection with secondary bacterial PNA (possible aspiration).  Also a significant component of pulmonary edema.  CXR much worse this morning when he was re-intubated.  Concern for ARDS based on appearance, but hope we can improve with diuresis.  -Remains intubated. Pending trach.  - volume status ok. Will start spiro 12.5 daily. Can give lasix  as needed   2. Shock: Resolved. Initially primarily septic shock/shock related to sedation (propofol ). Procalcitonin elevated at 3.43. Lactate has cleared. Off pressors.  -Resolved   3. Acute on chronic systolic CHF: Ischemic cardiomyopathy.  Echo this admission with EF 35-40% with normal RV and moderate MR, this is consistent with prior echoes. Initially, Lasix  was held and he had volume rescuscitation. CXR concerning for pulmonary edema.  - Coox 72%. CVP 7-8 - start spiro as above - Add Toprol  25 - No SGLT2i given risk of infection - ARB prior to d/c   4. ID: Rhinovirus infection, suspect  secondary bacterial PNA with sepsis, initial procal 3.43,  - iproved per CCM   5. CAD: NSTEMI [place] to [address].  He did have some chest pain at time of admission.  Echo was unchanged with EF 35-40%.  He was in shock  initially and has developed volume overload, but TnI seems high for demand ischemia alone.  Cannot rule out ACS with plaque rupture.  - Continue ASA 81 + atorva 40 mg daily - No role for coronary angio as it will not change management   6. VT: noted on 5/30 during SBT. Started on amio gtt. No further VT. - Continue PO amio 400 mg daily.    7. Hypokalemia - K 3.3. Replaced   8. 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 04/03/2016: Chronic left shoulder pain 07/11/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 03/06/2016: Cognitive deficit as late effect of traumatic brain  injury (HCC) No date: Coronary artery disease No date: Dementia (HCC) No date: Depression 07/11/2013: Disorder of male genital organs No date: Dyspnea 11/21/2014: Encounter for long-term (current) use of medications 07/11/2013: Erectile dysfunction 03/06/2016: Executive function deficit No date: GERD (gastroesophageal reflux disease) No date: Headache     Comment:  stress No date: Hearing loss No date: Hyperlipidemia No date: Hypertension 07/11/2013: Hypogonadism, 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: Hypotestosteronism No date: Injury of frontal lobe (HCC)     Comment:  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 03/06/2016: Major depression, recurrent, chronic (HCC) No date: Mild cognitive impairment     Comment:  s/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 02/02/2014: Mixed hyperlipidemia 08/2013: Myocardial infarction (HCC) No date: OCD (obsessive compulsive disorder) 04/20/2015: Other retinal detachments 07/11/2013: Other specified disorder of male genital organs(608.89) No date: Prostatic hypertrophy 04/20/2015: Pseudophakia of both eyes No date: Raynaud's disease 07/11/2013: Reduced libido No date: Repeated  falls     Comment:  weak left ankle No date: Scoliosis 2/16: Stroke (HCC)     Comment:  "light" No date: Synovitis     Comment:  knees, ankles  Past Surgical History: 2011: BACK SURGERY     Comment:  rods and screws 06/11/2018: CLOSED REDUCTION NASAL FRACTURE; Bilateral     Comment:  Procedure: CLOSED REDUCTION NASAL FRACTURE;  Surgeon:               [name], MD;  Location: [facility] OR[facility];  Service: ENT;                Laterality: Bilateral; 2015: COLONOSCOPY No date: CORONARY ANGIOPLASTY     Comment:  2015 stent 06/08/2019: CORONARY STENT INTERVENTION; N/A     Comment:  Procedure: CORONARY STENT INTERVENTION;  Surgeon:               Antonette Batters, MD;  Location: [facility] CV [facility];               Service: Cardiovascular;  Laterality: N/A; 1984: CYSTOSCOPY 2005,2006,2009: EYE SURGERY; Bilateral     Comment:  detached retina, cataract 05/30/2015: FOOT ARTHRODESIS; Left     Comment:  Procedure: ARTHRODESIS FOOT STJ LT FOOT;  Surgeon:               Anell Baptist, DPM;  Location: [facility];                Service: Podiatry;  Laterality: Left;  WITH POPLITEAL               BLOCK No date: FOOT SURGERY; Left 1969: HERNIA REPAIR; Right     Comment:  inguinal 06/08/2019: LEFT HEART CATH AND CORONARY ANGIOGRAPHY; Left     Comment:  Procedure: LEFT HEART CATH AND CORONARY ANGIOGRAPHY;                Surgeon: Antonette Batters, MD;  Location: [facility] INV[facility]              CV LAB;  Service: Cardiovascular;  Laterality: Left; 06/11/2018: SEPTOPLASTY; Bilateral     Comment:  Procedure: SEPTOPLASTY;  Surgeon: [name], MD;                Location: [facility] ORS;  Service: ENT;  Laterality:               Bilateral; 2004: SHOULDER SURGERY; Right No date: skull surgery 2009: TOE SURGERY; Right 2008: TONSILLECTOMY  BMI    Body Mass Index: 29.69 kg/m      Reproductive/Obstetrics negative OB ROS                             Anesthesia  Physical Anesthesia Plan  ASA: 4  Anesthesia Plan: General ETT and General   Post-op Pain Management:    Induction: Intravenous  PONV Risk Score and Plan: 2  Airway Management Planned: Oral ETT and Tracheostomy  Additional Equipment:   Intra-op Plan:   Post-operative Plan: Post-operative intubation/ventilation  Informed Consent: I have reviewed the patients History and Physical, chart, labs and discussed the procedure including the risks, benefits and alternatives for the proposed anesthesia with the patient or authorized representative who has indicated his/her understanding and acceptance.     Dental Advisory Given and Consent reviewed with POA  Plan Discussed with: CRNA and Surgeon  Anesthesia Plan Comments: (Risks discussed with wife)        Anesthesia Quick Evaluation

## 2023-09-22 NOTE — Progress Notes (Signed)
 Advanced Heart Failure Rounding Note  Cardiologist: None  Chief Complaint: Acute hypoxic respiratory failure Subjective:    Remains intubated/sedated. Rhythm stable.   Plan for PEG today. Trach tomorrow.   CVP port not working   Co-ox 72%  Objective:    Weight Range: 83.4 kg Body mass index is 29.69 kg/m.   Vital Signs:   Temp:  [96.6 F (35.9 C)-99 F (37.2 C)] 97.7 F (36.5 C) (06/03 0800) Pulse Rate:  [61-78] 72 (06/03 0832) Resp:  [11-18] 14 (06/03 0832) BP: (92-137)/(41-104) 137/79 (06/03 0800) SpO2:  [90 %-100 %] 90 % (06/03 0832) FiO2 (%):  [40 %-60 %] 60 % (06/03 0832) Weight:  [83.4 kg] 83.4 kg (06/03 0550) Last BM Date : 09/21/23  Weight change: Filed Weights   09/20/23 0500 09/21/23 0500 09/22/23 0550  Weight: 84.6 kg 83.3 kg 83.4 kg   Intake/Output:  Intake/Output Summary (Last 24 hours) at 09/22/2023 0916 Last data filed at 09/22/2023 0550 Gross per 24 hour  Intake 1943.53 ml  Output 1070 ml  Net 873.53 ml    Physical Exam   General:  intubated/sedated HEENT: normal + ETT Neck: supple. RIJ TLC JVP 7-8 Cor:  Regular rate & rhythm. No rubs, gallops or murmurs. Lungs: clear Abdomen: soft, nontender, nondistended. No hepatosplenomegaly. No bruits or masses. Good bowel sounds. Extremities: no cyanosis, clubbing, rash tr-1+, edema Neuro: sedated unresponsive   Telemetry   Sinus 60-70s Personally reviewed  Labs    CBC Recent Labs    09/21/23 0926 09/22/23 0418  WBC 6.4 7.9  HGB 11.8* 12.3*  HCT 36.1* 38.6*  MCV 97.3 97.5  PLT 235 278   Basic Metabolic Panel Recent Labs    40/98/11 0437 09/21/23 0458 09/22/23 0418  NA 149* 146* 147*  K 3.3* 3.3* 3.5  CL 103 98 101  CO2 35* 38* 36*  GLUCOSE 164* 159* 104*  BUN 42* 42* 39*  CREATININE 0.89 0.88 0.80  CALCIUM  7.6* 7.8* 7.9*  MG 2.7* 2.7*  --   PHOS 2.2* 3.4  --    BNP (last 3 results) Recent Labs    09/14/23 1835 09/17/23 0523  BNP 573.5* 968.1*   Fasting Lipid  Panel Recent Labs    09/21/23 0458  TRIG 190*   Imaging   No results found.   Medications:    Scheduled Medications:  amiodarone   400 mg Per Tube Daily   aspirin   81 mg Per Tube Daily   atorvastatin   40 mg Per Tube Daily   Chlorhexidine  Gluconate Cloth  6 each Topical Daily   docusate  100 mg Per Tube BID   feeding supplement (PROSource TF20)  60 mL Per Tube BID   free water   30 mL Per Tube Q4H   furosemide   40 mg Per Tube Once per day on Tuesday Thursday Saturday   ipratropium-albuterol   3 mL Nebulization TID   mouth rinse  15 mL Mouth Rinse Q2H   pantoprazole  (PROTONIX ) IV  40 mg Intravenous QHS   polyethylene glycol  17 g Per Tube BID   potassium chloride   40 mEq Per Tube Q4H   QUEtiapine  25 mg Per Tube BID   sodium chloride  flush  10-40 mL Intracatheter Q12H   valproic  acid  250 mg Per Tube TID    Infusions:  feeding supplement (VITAL AF 1.2 CAL) Stopped (09/22/23 0014)   propofol  (DIPRIVAN ) infusion 30 mcg/kg/min (09/22/23 0604)    PRN Medications: fentaNYL  (SUBLIMAZE ) injection, ipratropium-albuterol , naphazoline-glycerin , sodium chloride  flush  Patient Profile   Blake [name] is a 73 y.o. 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   Assessment/Plan   1. Acute hypoxemic respiratory failure: In setting of high fever, respiratory virus panel + for rhinovirus.  Suspect viral infection with secondary bacterial PNA (possible aspiration).  Also a significant component of pulmonary edema.  CXR much worse this morning when he was re-intubated.  Concern for ARDS based on appearance, but hope we can improve with diuresis.  -Remains intubated. Pending trach.  - volume status ok. Will start spiro 12.5 daily. Can give lasix  as needed  2. Shock: Resolved. Initially primarily septic shock/shock  related to sedation (propofol ). Procalcitonin elevated at 3.43. Lactate has cleared. Off pressors.  -Resolved  3. Acute on chronic systolic CHF: Ischemic cardiomyopathy.  Echo this admission with EF 35-40% with normal RV and moderate MR, this is consistent with prior echoes. Initially, Lasix  was held and he had volume rescuscitation. CXR concerning for pulmonary edema.  - Coox 72%. CVP 7-8 - start spiro as above - Add Toprol  25 - No SGLT2i given risk of infection - ARB prior to d/c  4. ID: Rhinovirus infection, suspect secondary bacterial PNA with sepsis, initial procal 3.43,  - iproved per CCM  5. CAD: NSTEMI with HS-TnI up to [address].  He did have some chest pain at time of admission.  Echo was unchanged with EF 35-40%.  He was in shock initially and has developed volume overload, but TnI seems high for demand ischemia alone.  Cannot rule out ACS with plaque rupture.  - Continue ASA 81 + atorva 40 mg daily - No role for coronary angio as it will not change management  6. VT: noted on 5/30 during SBT. Started on amio gtt. No further VT. - Continue PO amio 400 mg daily.   7. Hypokalemia - K 3.3. Replace  8. Hypernatremia, hyperchloremia - supp - add spiro   HF team wil follow at a distance. Please call with questions.  Length of Stay: 8  CRITICAL CARE Performed by: Jules Oar  Total critical care time: 36 minutes  Critical care time was exclusive of separately billable procedures and treating other patients.  Critical care was necessary to treat or prevent imminent or life-threatening deterioration.  Critical care was time spent personally by me on the following activities: development of treatment plan with patient and/or surrogate as well as nursing, discussions with consultants, evaluation of patient's response to treatment, examination of patient, obtaining history from patient or surrogate, ordering and performing treatments and interventions, ordering and review of  laboratory studies, ordering and review of radiographic studies, pulse oximetry and re-evaluation of patient's condition.  Jules Oar, MD  09/22/2023, 9:16 AM  Advanced Heart Failure Team Pager [phone] (M-F; 7a - 5p)  Please contact [facility] for night-coverage after hours (5p -7a ) and weekends on [email or web address]

## 2023-09-22 NOTE — Progress Notes (Signed)
 Nutrition Follow-up  DOCUMENTATION CODES:   Obesity unspecified  INTERVENTION:   Once G-tube in place and ready for use:  Vital 1.5@60ml /hr continuous + ProSource TF 20- Give 60ml daily via tube  Free water  flushes 30ml q4 hours to maintain tube patency   Regimen provides 2240kcal/day, 117g/day protein and 1280ml/day of free water .   Daily weights  NUTRITION DIAGNOSIS:   Inadequate oral intake related to inability to eat as evidenced by NPO status. 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  Pt remains sedated and ventilated. OGT in place and pt has been tolerating tube feeds at goal rate. Tube feeds on hold today for planned IR G-tube. RD will resume tube feeds once G-tube in place and ready for use. Per chart, pt is down ~14lbs since admission but appears to be back at his UBW. Pt +3.2L on his I & Os. Plan is for tracheostomy tomorrow.   Medications reviewed and include: colace, protonix , miralax , aldactone, propofol    Labs reviewed: Na 147(H), K 3.5 wnl, BUN 39(H) P 3.4 wnl, Mg 2.7(H)-6/2 Cbgs- 93, 95, 100 x 24 hrs   Patient is currently intubated on ventilator support MV: 7.8 L/min Temp (24hrs), Avg:97.9 F (36.6 C), Min:96.6 F (35.9 C), Max:99 F (37.2 C)  Propofol : 16.4 ml/hr- provides 433kcal/day   UOP-   Diet Order:   Diet Order             Diet NPO time specified  Diet effective now                  EDUCATION NEEDS:   Not appropriate for education at this time  Skin:  Skin Assessment: Reviewed RN Assessment  Last BM:   6/3- - type 7  Height:   Ht Readings from Last 1 Encounters:  09/18/23 5' 5.98" (1.676 m)    Weight:   Wt Readings from Last 1 Encounters:  09/22/23 83.4 kg    Ideal Body Weight:  64.5 kg  BMI:  Body mass index is 29.69 kg/m.  Estimated Nutritional Needs:   Kcal:  2000-2300kcal/day  Protein:  100-115g/day  Fluid:  1.7-2.0L/day  Torrance Freestone MS, RD, LDN If unable to be reached, please send secure chat to "RD inpatient" available from 8:00a-4:00p daily

## 2023-09-22 NOTE — Progress Notes (Signed)
 Pt escorted down to specials for PEG tube placement by this RN, RT and IR nurse

## 2023-09-22 NOTE — Progress Notes (Signed)
 TF turned off for possible PEG placement later today. Enolia Hartmann, NP aware.

## 2023-09-22 NOTE — Progress Notes (Addendum)
 [facility] CLINIC CARDIOLOGY PROGRESS NOTE       Patient ID: Blake [name] MRN: [id number] DOB/AGE: 12/07/1950 73 y.o.  Admit date: 09/14/2023 Referring Physician Dr. Vergia Glasgow Primary Physician Rory Collard, MD Primary Cardiologist Dr. Beau Bound Reason for Consultation Elevated trops, NSTEMI  HPI: Blake [name] is a 73 y.o. male  with a past medical history of coronary artery disease s/p stent to LAD, ischemic cardiomyopathy s/p ICD ([employer]), chronic HFrEF, OSA (on CPAP), hx CVA who presented to the ED on 09/14/2023 for dyspnea, chest pain and AMS. Patient recently seen at urgent care for nonproductive cough, chills/diaphoresis Troponins found to be elevated.  Cardiology was consulted for further evaluation.   Interval History: -Patient seen and examined this AM. Patient intubated. Off levo gtt as of 05/30 with BP and HR stable. -Overnight Tele showed no significant events. No further VT. -Patient appears euvolemic.  -Pulmonology will proceed with PEG tube today and trach tomorrow.   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   Coronary artery disease    Dementia (HCC)    Depression    Disorder of male genital organs 07/11/2013   Dyspnea    Encounter for long-term (current) use of medications 11/21/2014   Erectile dysfunction 07/11/2013   Executive function deficit 03/06/2016   GERD (gastroesophageal reflux disease)    Headache    stress   Hearing loss    Hyperlipidemia    Hypertension    Hypogonadism, 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   Major depression, recurrent, chronic (HCC) 03/06/2016   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   Mixed hyperlipidemia 02/02/2014   Myocardial infarction (HCC) 08/2013   OCD (obsessive compulsive disorder)    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   Other specified disorder of male genital organs(608.89) 07/11/2013   Prostatic hypertrophy    Pseudophakia of both eyes 04/20/2015   Raynaud's disease    Reduced libido 07/11/2013   Repeated falls    weak left ankle   Scoliosis    Stroke (HCC) 2/16   "light"   Synovitis    knees, ankles    Past Surgical History:  Procedure Laterality Date   BACK SURGERY  2011   rods and screws   CLOSED REDUCTION NASAL FRACTURE Bilateral 06/11/2018   Procedure: CLOSED REDUCTION NASAL FRACTURE;  Surgeon: [name], MD;  Location: [facility] ORS;  Service: ENT;  Laterality: Bilateral;   COLONOSCOPY  2015   CORONARY ANGIOPLASTY     2015 stent   CORONARY STENT INTERVENTION N/A 06/08/2019   Procedure: CORONARY STENT INTERVENTION;  Surgeon: Antonette Batters, MD;  Location: [facility] INVASIVE CV LAB;  Service: Cardiovascular;  Laterality: N/A;   CYSTOSCOPY  1984   EYE SURGERY Bilateral 2005,2006,2009   detached retina, cataract   FOOT ARTHRODESIS Left 05/30/2015   Procedure: ARTHRODESIS FOOT STJ LT FOOT;  Surgeon: Anell Baptist, DPM;  Location: [facility];  Service: Podiatry;  Laterality: Left;  WITH POPLITEAL BLOCK   FOOT SURGERY Left    HERNIA REPAIR Right 1969   inguinal   LEFT HEART CATH AND CORONARY ANGIOGRAPHY Left 06/08/2019   Procedure: LEFT HEART CATH AND CORONARY ANGIOGRAPHY;  Surgeon: Antonette Batters, MD;  Location: [facility] INVASIVE CV LAB;  Service: Cardiovascular;  Laterality: Left;   SEPTOPLASTY Bilateral 06/11/2018   Procedure: SEPTOPLASTY;  Surgeon: [name], MD;  Location: [facility] ORS;  Service: ENT;  Laterality: Bilateral;    SHOULDER SURGERY Right 2004   skull surgery     TOE SURGERY Right 2009   TONSILLECTOMY  2008    Medications Prior to Admission  Medication Sig Dispense Refill Last Dose/Taking   acetaminophen  (TYLENOL ) 500 MG tablet Take 1,000 mg by mouth every 6 (six) hours as needed for mild pain or moderate pain.   Unknown   aspirin  EC 81 MG tablet Take 81 mg by mouth daily.   09/14/2023 at  6:00 AM   B Complex-C (SUPER B COMPLEX PO) Take 1 tablet by mouth daily.   09/14/2023 Morning   Calcium  Carbonate-Vitamin D  600-200 MG-UNIT CAPS Take 1 tablet by mouth 2 (two) times daily.    09/14/2023 Morning   carvedilol  (COREG ) 3.125 MG tablet Take 3.125 mg by mouth daily.    09/14/2023 Morning   [EXPIRED] cefdinir (OMNICEF) 300 MG capsule Take 300 mg by mouth 2 (two) times daily.   09/14/2023 Noon   clindamycin  (CLEOCIN  T) 1 % lotion Apply 1 application topically 2 (two) times daily as needed.    Unknown   clopidogrel  (PLAVIX ) 75 MG tablet Take 75 mg by mouth daily.   09/14/2023 at  6:00 AM   divalproex  (DEPAKOTE  ER) 250 MG 24 hr tablet Total of 750 mg daily. Take along with 500 mg tab 30 tablet 0 09/14/2023 at  8:00 AM   divalproex  (DEPAKOTE  ER) 500 MG 24 hr tablet Take 1 tablet (500 mg total) by mouth daily. 30 tablet 1 09/14/2023 at  8:00 AM   DULoxetine  (CYMBALTA ) 60 MG capsule Take 60 mg by mouth 2 (two) times daily.    09/14/2023 at  3:30 AM   FIBER PO Take 1 tablet by mouth 2 (two) times daily.    09/14/2023 Morning   furosemide  (LASIX ) 20 MG tablet Take 20 mg by mouth daily.   09/14/2023 Morning   latanoprost (XALATAN) 0.005 % ophthalmic solution Place 1 drop into the left eye daily.   09/14/2023 Morning   losartan (COZAAR) 100 MG tablet Take 100 mg by mouth daily.   09/14/2023 Morning   Melatonin 5 MG TABS Take 10 mg by mouth at bedtime. 30 min before bed   09/13/2023 Bedtime   omeprazole (PRILOSEC) 20 MG capsule Take 20 mg by mouth daily.   09/14/2023 Morning   Probiotic Product (PROBIOTIC DAILY PO) Take 1 tablet by  mouth daily. Senior   09/14/2023 Morning   silodosin  (RAPAFLO ) 8 MG CAPS capsule TAKE 1 CAPSULE BY MOUTH DAILY WITH BREAKFAST (Patient taking differently: Take 8 mg by mouth daily.) 90 capsule 3 09/13/2023 at  6:00 PM   sodium chloride  (OCEAN) 0.65 % SOLN nasal spray Place 2 sprays into both nostrils as needed for congestion.   09/14/2023 Morning   tadalafil  (CIALIS ) 5 MG tablet Take 1 tablet by mouth once daily 90 tablet 0 09/14/2023 Morning   SYRINGE-NEEDLE, DISP, 3 ML (B-D 3CC LUER-LOK SYR 22GX1-1/2) 22G X 1-1/2" 3 ML MISC USE AS DIRECTED WITH TESTOSTERONE  12 each 20  testosterone  cypionate (DEPOTESTOSTERONE CYPIONATE) 200 MG/ML injection Inject 0.3 mLs (60 mg total) into the muscle once a week. DISCARD REMAINING AS THESE ARE SINGLE USE VIALS. (Patient taking differently: Inject 60 mg into the muscle every 14 (fourteen) days. EVERY OTHER SATURDAY. DISCARD REMAINING AS THESE ARE SINGLE USE VIALS.) 5 mL 0    Social History   Socioeconomic History   Marital status: Married    Spouse name: cindy   Number of children: 1   Years of education: Not on file   Highest education level: Professional school degree (e.g., MD, DDS, DVM, JD)  Occupational History   Not on file  Tobacco Use   Smoking status: Never   Smokeless tobacco: Never  Vaping Use   Vaping status: Never Used  Substance and Sexual Activity   Alcohol use: No   Drug use: No   Sexual activity: Not Currently    Birth control/protection: None  Other Topics Concern   Not on file  Social History Narrative   Not on file   Social Drivers of Health   Financial Resource Strain: Not on file  Food Insecurity: No Food Insecurity (09/15/2023)   Hunger Vital Sign    Worried About Running Out of Food in the Last Year: Never true    Ran Out of Food in the Last Year: Never true  Transportation Needs: No Transportation Needs (09/15/2023)   PRAPARE - Administrator, Civil Service (Medical): No    Lack of Transportation  (Non-Medical): No  Physical Activity: Not on file  Stress: Not on file  Social Connections: Patient Unable To Answer (09/15/2023)   Social Connection and Isolation Panel [NHANES]    Frequency of Communication with Friends and Family: Patient unable to answer    Frequency of Social Gatherings with Friends and Family: Patient unable to answer    Attends Religious Services: Patient unable to answer    Active Member of Clubs or Organizations: Patient unable to answer    Attends Banker Meetings: Patient unable to answer    Marital Status: Patient unable to answer  Intimate Partner Violence: Unknown (09/15/2023)   Humiliation, Afraid, Rape, and Kick questionnaire    Fear of Current or Ex-Partner: Patient unable to answer    Emotionally Abused: Patient unable to answer    Physically Abused: Not on file    Sexually Abused: Patient unable to answer    Family History  Problem Relation Age of Onset   Asperger's syndrome Mother      Vitals:   09/22/23 0800 09/22/23 0832 09/22/23 0900 09/22/23 1000  BP: 137/79  135/75 116/74  Pulse: 74 72 74 72  Resp: 15 14 15 13   Temp: 97.7 F (36.5 C)     TempSrc: Axillary     SpO2: 96% 90% 92% 92%  Weight:      Height:        PHYSICAL EXAM General: Chronically ill-appearing elderly male, well nourished, in no acute distress. HEENT: Normocephalic and atraumatic. Neck: No JVD.   Lungs: Intubated.  Mechanical breath sounds bilaterally. Heart: HRRR. Normal S1 and S2 without gallops or murmurs.  Abdomen: Non-distended appearing.  Msk: Normal strength and tone for age. Extremities: Warm and well perfused. No clubbing, cyanosis. No edema.  Neuro: Alert and oriented X 3. Psych: Answers questions appropriately.   Labs: Basic Metabolic Panel: Recent Labs    09/20/23 0437 09/21/23 0458 09/22/23 0418  NA 149* 146* 147*  K 3.3* 3.3* 3.5  CL 103 98  101  CO2 35* 38* 36*  GLUCOSE 164* 159* 104*  BUN 42* 42* 39*  CREATININE 0.89 0.88  0.80  CALCIUM  7.6* 7.8* 7.9*  MG 2.7* 2.7*  --   PHOS 2.2* 3.4  --    Liver Function Tests: No results for input(s): "AST", "ALT", "ALKPHOS", "BILITOT", "PROT", "ALBUMIN" in the last 72 hours.  No results for input(s): "LIPASE", "AMYLASE" in the last 72 hours. CBC: Recent Labs    09/21/23 0926 09/22/23 0418  WBC 6.4 7.9  HGB 11.8* 12.3*  HCT 36.1* 38.6*  MCV 97.3 97.5  PLT 235 278   Cardiac Enzymes: No results for input(s): "CKTOTAL", "CKMB", "CKMBINDEX", "TROPONINIHS" in the last 72 hours.  BNP: No results for input(s): "BNP" in the last 72 hours.  D-Dimer: No results for input(s): "DDIMER" in the last 72 hours. Hemoglobin A1C: No results for input(s): "HGBA1C" in the last 72 hours. Fasting Lipid Panel: Recent Labs    09/21/23 0458  TRIG 190*   Thyroid Function Tests: No results for input(s): "TSH", "T4TOTAL", "T3FREE", "THYROIDAB" in the last 72 hours.  Invalid input(s): "FREET3"  Anemia Panel: No results for input(s): "VITAMINB12", "FOLATE", "FERRITIN", "TIBC", "IRON", "RETICCTPCT" in the last 72 hours.   Radiology: [facility] Chest Port 1 View Result Date: 09/20/2023 CLINICAL DATA:  73 year old male respiratory failure, intubated. EXAM: PORTABLE CHEST 1 VIEW COMPARISON:  Portable chest yesterday and earlier. FINDINGS: Portable AP upright view at 0942 hours. Stable lines and tubes. Left chest pacer/resuscitation pads persist. Stable lung volumes and mediastinal contours. Stable left chest AICD. Stable ventilation since yesterday. No pneumothorax, pulmonary edema. Patchy left greater than right lung base opacity. Paucity of bowel gas in the upper abdomen. Stable visualized osseous structures. IMPRESSION: 1. Stable lines and tubes. 2. Stable ventilation since yesterday. Patchy left greater than right lung base opacity. Electronically Signed   By: Marlise Simpers M.D.   On: 09/20/2023 10:26   DG Chest Port 1 View Result Date: 09/19/2023 CLINICAL DATA:  73 year old male with respiratory  failure. EXAM: PORTABLE CHEST 1 VIEW COMPARISON:  Portable AP semi upright view at 0751 hours. FINDINGS: Portable AP semi upright view at 0751 hours. Less rotated to the left now. Pacer and resuscitation pads project over the lower chest. Stable left chest AICD. Endotracheal tube tip remains satisfactory. Stable lines and tubes. Stable low lung volumes, patchy perihilar and lung base opacity. Ventilation is stable with no pneumothorax, pleural effusion identified. And ventilation has improved compared to 09/17/2023. Stable cardiac size and mediastinal contours. Paucity of bowel gas now.  Stable visualized osseous structures. IMPRESSION: 1. Stable lines and tubes. 2. Stable ventilation since yesterday, improved since 09/17/2023. Stable low lung volumes and patchy perihilar and lung base opacity. Electronically Signed   By: Marlise Simpers M.D.   On: 09/19/2023 08:02   DG Abd 1 View Result Date: 09/18/2023 CLINICAL DATA:  Check gastric catheter placement EXAM: ABDOMEN - 1 VIEW COMPARISON:  09/17/2023 FINDINGS: Gastric catheter is noted coiled within the stomach. No free air is seen. Postsurgical changes in the lower lumbar spine are noted. No obstructive pattern is seen. IMPRESSION: Gastric catheter in the stomach. Electronically Signed   By: Violeta Grey M.D.   On: 09/18/2023 11:18   DG Chest Port 1 View Result Date: 09/18/2023 CLINICAL DATA:  Shortness of breath and chest pain EXAM: PORTABLE CHEST 1 VIEW COMPARISON:  09/17/2023 FINDINGS: Cardiac shadow is stable. Defibrillator is again noted and stable. Endotracheal 2 and gastric catheter are seen. Lungs are well  aerated bilaterally. Improved aeration is noted with decrease in edematous changes. Some basilar opacities are again seen bilaterally. IMPRESSION: Improved aeration when compared with the prior exam. Some persistent basilar opacities remain. Electronically Signed   By: Violeta Grey M.D.   On: 09/18/2023 11:09   DG Abd 1 View Result Date:  09/17/2023 CLINICAL DATA:  OG tube placement. EXAM: ABDOMEN - 1 VIEW COMPARISON:  09/14/2023 FINDINGS: OG tube tip is positioned in the mid stomach with proximal side port below the expected location of the GE junction. Nonspecific bowel gas pattern within the visualized abdomen. IMPRESSION: OG tube tip is positioned in the mid stomach. Electronically Signed   By: Donnal Fusi M.D.   On: 09/17/2023 06:42   DG Chest Port 1 View Result Date: 09/17/2023 CLINICAL DATA:  73 year old male intubated, central line placement, bilateral airspace opacity. EXAM: PORTABLE CHEST 1 VIEW COMPARISON:  Portable chest 09/15/2023 and earlier. FINDINGS: Portable AP supine view at 0610 hours. Endotracheal tube projects over the midline trachea in good position between the clavicles and carina. Enteric tube has been removed. Stable right IJ approach central line. Stable left chest AICD. Similar lung volumes but diffuse bilateral increased pulmonary opacity with combined reticulonodular and vague/airspace appearance. No pneumothorax or pleural effusion. No air bronchograms. Stable cardiac size and mediastinal contours. No acute osseous abnormality identified. Paucity of bowel gas in the visible abdomen. IMPRESSION: 1. Satisfactory ET tube. Enteric tube removed. Stable right IJ approach central line. 2. Increased bilateral pulmonary opacity from two days ago, differential considerations include progressive pulmonary edema, bilateral infection, ARDS. No pleural effusion is evident. Electronically Signed   By: Marlise Simpers M.D.   On: 09/17/2023 06:36   ECHOCARDIOGRAM COMPLETE Result Date: 09/16/2023    ECHOCARDIOGRAM REPORT   Patient Name:   DR. Hannah Lewis [name] Date of Exam: 09/15/2023 Medical Rec #:  [id number]             Height:       66.0 in Accession #:    [id number]            Weight:       200.6 lb Date of Birth:  08/02/1950             BSA:          2.002 m Patient Age:    73 years              BP:           106/68 mmHg Patient  Gender: M                     HR:           77 bpm. Exam Location:  [facility] Procedure: 2D Echo, Cardiac Doppler and Color Doppler (Both Spectral and Color            Flow Doppler were utilized during procedure). Indications:     Elevated Troponin  History:         Patient has no prior history of Echocardiogram examinations.                  CAD, Stroke, Signs/Symptoms:Dyspnea; Risk Factors:Hypertension                  and Dyslipidemia.  Sonographer:     Brigid Canada RDCS Referring Phys:  [id number] Delanna Fears Diagnosing Phys: Sabina Custovic IMPRESSIONS  1. Left ventricular ejection fraction, by estimation, is 35 to 40%. The  left ventricle has moderately decreased function. The left ventricle demonstrates regional wall motion abnormalities (see scoring diagram/findings for description). Left ventricular  diastolic parameters are consistent with Grade I diastolic dysfunction (impaired relaxation).  2. Right ventricular systolic function is normal. The right ventricular size is normal. There is mildly elevated pulmonary artery systolic pressure. The estimated right ventricular systolic pressure is 42.7 mmHg.  3. The mitral valve is normal in structure. Moderate mitral valve regurgitation. No evidence of mitral stenosis.  4. The aortic valve is normal in structure. Aortic valve regurgitation is not visualized. No aortic stenosis is present.  5. The inferior vena cava is normal in size with greater than 50% respiratory variability, suggesting right atrial pressure of 3 mmHg. FINDINGS  Left Ventricle: Left ventricular ejection fraction, by estimation, is 35 to 40%. The left ventricle has moderately decreased function. The left ventricle demonstrates regional wall motion abnormalities. The left ventricular internal cavity size was normal in size. There is no left ventricular hypertrophy. Left ventricular diastolic parameters are consistent with Grade I diastolic dysfunction (impaired relaxation).  LV Wall  Scoring: The antero-lateral wall and apical lateral segment are hypokinetic. Right Ventricle: The right ventricular size is normal. No increase in right ventricular wall thickness. Right ventricular systolic function is normal. There is mildly elevated pulmonary artery systolic pressure. The tricuspid regurgitant velocity is 2.63  m/s, and with an assumed right atrial pressure of 15 mmHg, the estimated right ventricular systolic pressure is 42.7 mmHg. Left Atrium: Left atrial size was normal in size. Right Atrium: Right atrial size was normal in size. Pericardium: There is no evidence of pericardial effusion. Mitral Valve: The mitral valve is normal in structure. Moderate mitral valve regurgitation. No evidence of mitral valve stenosis. Tricuspid Valve: The tricuspid valve is normal in structure. Tricuspid valve regurgitation is mild. The aortic valve is normal in structure. Aortic valve regurgitation is not visualized. No aortic stenosis is present. Pulmonic Valve: The pulmonic valve was normal in structure. Pulmonic valve regurgitation is not visualized. Aorta: The aortic root is normal in size and structure. Venous: The inferior vena cava is normal in size with greater than 50% respiratory variability, suggesting right atrial pressure of 3 mmHg. IAS/Shunts: No atrial level shunt detected by color flow Doppler.  LEFT VENTRICLE PLAX 2D LVIDd:         5.50 cm   Diastology LVIDs:         4.80 cm   LV e' medial:    6.71 cm/s LV PW:         1.00 cm   LV E/e' medial:  12.9 LV IVS:        1.00 cm   LV e' lateral:   12.83 cm/s LVOT diam:     2.30 cm   LV E/e' lateral: 6.8 LV SV:         63 LV SV Index:   32 LVOT Area:     4.15 cm  RIGHT VENTRICLE             IVC RV Basal diam:  3.70 cm     IVC diam: 2.30 cm RV S prime:     14.50 cm/s TAPSE (M-mode): 2.5 cm LEFT ATRIUM             Index        RIGHT ATRIUM           Index LA diam:        4.70 cm 2.35 cm/m   RA Area:  12.60 cm LA Vol (A2C):   61.3 ml 30.61 ml/m  RA  Volume:   31.10 ml  15.53 ml/m LA Vol (A4C):   36.9 ml 18.43 ml/m LA Biplane Vol: 48.5 ml 24.22 ml/m  AORTIC VALVE LVOT Vmax:   82.47 cm/s LVOT Vmean:  54.333 cm/s LVOT VTI:    0.153 m AI PHT:      376 msec  AORTA Ao Root diam: 3.20 cm Ao Asc diam:  3.70 cm MITRAL VALVE               TRICUSPID VALVE MV Area (PHT): 5.14 cm    TR Peak grad:   27.7 mmHg MV Decel Time: 148 msec    TR Vmax:        263.00 cm/s MV E velocity: 86.75 cm/s MV A velocity: 55.30 cm/s  SHUNTS MV E/A ratio:  1.57        Systemic VTI:  0.15 m                            Systemic Diam: 2.30 cm Lanell Pinta Custovic Electronically signed by Isabell Manzanilla Signature Date/Time: 09/16/2023/11:03:59 AM    Final    DG Chest Port 1 View Result Date: 09/15/2023 EXAM: 1 VIEW XRAY OF THE CHEST 09/15/2023 10:35:00 AM COMPARISON: 1 view chest x-ray 05/16/2023 at 2:38 PM. CLINICAL HISTORY: Endotracheally intubated. FINDINGS: LUNGS AND PLEURA: Mild edema and bilateral effusions are similar to the prior study. Bibasilar airspace opacity likely reflects atelectasis. HEART AND MEDIASTINUM: The heart is enlarged. BONES AND SOFT TISSUES: No acute osseous abnormality. LINES AND TUBES: The endotracheal tube is stable, 3.5 cm above the carina. A right IJ line is stable. IMPRESSION: 1. Stable endotracheal tube and right IJ line. 2. Enlarged heart. 3. Mild edema and bilateral effusions, similar to the prior study. 4. Bibasilar airspace opacity, likely atelectasis. Electronically signed by: Audree Leas MD 09/15/2023 01:26 PM EDT RP Workstation: [id number]   DG Chest Port 1 View Result Date: 09/15/2023 CLINICAL DATA:  Central line placement EXAM: PORTABLE CHEST 1 VIEW COMPARISON:  Radiograph and CT 09/14/2023 FINDINGS: Stable cardiomegaly. Pulmonary vascular congestion and bilateral ground-glass opacities. Small right pleural effusion and right basilar airspace opacities. No pneumothorax. Endotracheal tube tip in the intrathoracic trachea 3.0 cm from the carina.  Subdiaphragmatic enteric tube. Right IJ CVC tip in the mid SVC. Left chest wall ICD. IMPRESSION: 1. Right IJ CVC tip in the mid SVC. No pneumothorax. 2. Similar findings of congestive heart failure. Electronically Signed   By: Rozell Cornet M.D.   On: 09/15/2023 02:52   CT Angio Chest PE W and/or Wo Contrast Result Date: 09/14/2023 CLINICAL DATA:  Cold symptoms, chest pain, short of breath, abdominal pain EXAM: CT ANGIOGRAPHY CHEST CT ABDOMEN AND PELVIS WITH CONTRAST TECHNIQUE: Multidetector CT imaging of the chest was performed using the standard protocol during bolus administration of intravenous contrast. Multiplanar CT image reconstructions and MIPs were obtained to evaluate the vascular anatomy. Multidetector CT imaging of the abdomen and pelvis was performed using the standard protocol during bolus administration of intravenous contrast. RADIATION DOSE REDUCTION: This exam was performed according to the departmental dose-optimization program which includes automated exposure control, adjustment of the mA and/or kV according to patient size and/or use of iterative reconstruction technique. CONTRAST:  OMNIPAQUE  IOHEXOL  350 MG/ML SOLN COMPARISON:  01/02/2010, 08/23/2013, 09/14/2023 FINDINGS: CTA CHEST FINDINGS Cardiovascular: This is a technically adequate evaluation of the pulmonary vasculature. No filling defects  or pulmonary emboli. Mild cardiomegaly with left ventricular dilatation. No pericardial effusion. Dual lead cardiac pacer, proximal lead in the right atrium and distal lead in the right ventricle. 4.1 cm ascending thoracic aortic aneurysm. No evidence of dissection. Atherosclerosis of the aorta and coronary vasculature. Mediastinum/Nodes: Endotracheal tube terminates just above carina. Enteric catheter extends into the gastric lumen. No pathologic adenopathy. Lungs/Pleura: There is dense bilateral perihilar airspace disease, greatest in the lower lobes. Trace bilateral pleural effusions. No  pneumothorax. Central airways are patent. Musculoskeletal: No acute or destructive bony abnormalities. Reconstructed images demonstrate no additional findings. Review of the MIP images confirms the above findings. CT ABDOMEN and PELVIS FINDINGS Hepatobiliary: No focal liver abnormality is seen. No gallstones, gallbladder wall thickening, or biliary dilatation. Pancreas: Unremarkable. No pancreatic ductal dilatation or surrounding inflammatory changes. Spleen: Normal in size without focal abnormality. Adrenals/Urinary Tract: Adrenal glands are unremarkable. Kidneys are normal, without renal calculi, focal lesion, or hydronephrosis. The bladder is decompressed with an indwelling Foley catheter. Stomach/Bowel: No bowel obstruction or ileus. Normal appendix right lower quadrant. Scattered colonic diverticulosis without evidence of acute diverticulitis. Enteric catheter tip within the lumen of the gastric body. Vascular/Lymphatic: Aortic atherosclerosis. No enlarged abdominal or pelvic lymph nodes. Reproductive: Stable enlargement of the prostate. Other: No free fluid or free intraperitoneal gas. No abdominal wall hernia. Musculoskeletal: No acute or destructive bony abnormalities. Postsurgical changes from L2-L4 posterior fusion. Reconstructed images demonstrate no additional findings. Review of the MIP images confirms the above findings. IMPRESSION: Chest: 1. No evidence of pulmonary embolus. 2. Dense bilateral perihilar airspace disease, greatest in the lower lobes, which could reflect widespread infection or edema. 3. Trace bilateral pleural effusions. 4. Aortic Atherosclerosis (ICD10-I70.0). Coronary artery atherosclerosis. 5. 4.1 cm ascending thoracic aortic aneurysm. Recommend annual imaging followup by CTA or MRA. This recommendation follows 2010 ACCF/AHA/AATS/ACR/ASA/SCA/SCAI/SIR/STS/SVM Guidelines for the Diagnosis and Management of Patients with Thoracic Aortic Disease. Circulation. 2010; 121: Z610-R604.  Aortic aneurysm NOS (ICD10-I71.9) Abdomen/pelvis: 1. No acute intra-abdominal or intrapelvic process. Normal appendix. 2. Distal colonic diverticulosis without diverticulitis. 3. Enlarged prostate. 4.  Aortic Atherosclerosis (ICD10-I70.0). Electronically Signed   By: Bobbye Burrow M.D.   On: 09/14/2023 22:40   CT ABDOMEN PELVIS W CONTRAST Result Date: 09/14/2023 CLINICAL DATA:  Cold symptoms, chest pain, short of breath, abdominal pain EXAM: CT ANGIOGRAPHY CHEST CT ABDOMEN AND PELVIS WITH CONTRAST TECHNIQUE: Multidetector CT imaging of the chest was performed using the standard protocol during bolus administration of intravenous contrast. Multiplanar CT image reconstructions and MIPs were obtained to evaluate the vascular anatomy. Multidetector CT imaging of the abdomen and pelvis was performed using the standard protocol during bolus administration of intravenous contrast. RADIATION DOSE REDUCTION: This exam was performed according to the departmental dose-optimization program which includes automated exposure control, adjustment of the mA and/or kV according to patient size and/or use of iterative reconstruction technique. CONTRAST:  OMNIPAQUE  IOHEXOL  350 MG/ML SOLN COMPARISON:  01/02/2010, 08/23/2013, 09/14/2023 FINDINGS: CTA CHEST FINDINGS Cardiovascular: This is a technically adequate evaluation of the pulmonary vasculature. No filling defects or pulmonary emboli. Mild cardiomegaly with left ventricular dilatation. No pericardial effusion. Dual lead cardiac pacer, proximal lead in the right atrium and distal lead in the right ventricle. 4.1 cm ascending thoracic aortic aneurysm. No evidence of dissection. Atherosclerosis of the aorta and coronary vasculature. Mediastinum/Nodes: Endotracheal tube terminates just above carina. Enteric catheter extends into the gastric lumen. No pathologic adenopathy. Lungs/Pleura: There is dense bilateral perihilar airspace disease, greatest in the lower lobes. Trace  bilateral pleural effusions. No pneumothorax. Central airways are patent. Musculoskeletal: No acute or destructive bony abnormalities. Reconstructed images demonstrate no additional findings. Review of the MIP images confirms the above findings. CT ABDOMEN and PELVIS FINDINGS Hepatobiliary: No focal liver abnormality is seen. No gallstones, gallbladder wall thickening, or biliary dilatation. Pancreas: Unremarkable. No pancreatic ductal dilatation or surrounding inflammatory changes. Spleen: Normal in size without focal abnormality. Adrenals/Urinary Tract: Adrenal glands are unremarkable. Kidneys are normal, without renal calculi, focal lesion, or hydronephrosis. The bladder is decompressed with an indwelling Foley catheter. Stomach/Bowel: No bowel obstruction or ileus. Normal appendix right lower quadrant. Scattered colonic diverticulosis without evidence of acute diverticulitis. Enteric catheter tip within the lumen of the gastric body. Vascular/Lymphatic: Aortic atherosclerosis. No enlarged abdominal or pelvic lymph nodes. Reproductive: Stable enlargement of the prostate. Other: No free fluid or free intraperitoneal gas. No abdominal wall hernia. Musculoskeletal: No acute or destructive bony abnormalities. Postsurgical changes from L2-L4 posterior fusion. Reconstructed images demonstrate no additional findings. Review of the MIP images confirms the above findings. IMPRESSION: Chest: 1. No evidence of pulmonary embolus. 2. Dense bilateral perihilar airspace disease, greatest in the lower lobes, which could reflect widespread infection or edema. 3. Trace bilateral pleural effusions. 4. Aortic Atherosclerosis (ICD10-I70.0). Coronary artery atherosclerosis. 5. 4.1 cm ascending thoracic aortic aneurysm. Recommend annual imaging followup by CTA or MRA. This recommendation follows 2010 ACCF/AHA/AATS/ACR/ASA/SCA/SCAI/SIR/STS/SVM Guidelines for the Diagnosis and Management of Patients with Thoracic Aortic Disease.  Circulation. 2010; 121: [id number]. Aortic aneurysm NOS (ICD10-I71.9) Abdomen/pelvis: 1. No acute intra-abdominal or intrapelvic process. Normal appendix. 2. Distal colonic diverticulosis without diverticulitis. 3. Enlarged prostate. 4.  Aortic Atherosclerosis (ICD10-I70.0). Electronically Signed   By: Bobbye Burrow M.D.   On: 09/14/2023 22:40   CT HEAD WO CONTRAST ( ) Result Date: 09/14/2023 CLINICAL DATA:  Head trauma, minor (Age >= 65y) EXAM: CT HEAD WITHOUT CONTRAST TECHNIQUE: Contiguous axial images were obtained from the base of the skull through the vertex without intravenous contrast. RADIATION DOSE REDUCTION: This exam was performed according to the departmental dose-optimization program which includes automated exposure control, adjustment of the mA and/or kV according to patient size and/or use of iterative reconstruction technique. COMPARISON:  CT head 08/15/2022 FINDINGS: Brain: Bilateral anterior inferior frontal lobe and bilateral anterior temporal lobe encephalomalacia. Left cerebellar encephalomalacia. No evidence of large-territorial acute infarction. No parenchymal hemorrhage. No mass lesion. No extra-axial collection. No mass effect or midline shift. No hydrocephalus. Basilar cisterns are patent. Vascular: No hyperdense vessel. Atherosclerotic calcifications are present within the cavernous internal carotid and vertebral arteries. Skull: No acute fracture or focal lesion. Old left occipital skull fracture. Sinuses/Orbits: Paranasal sinuses and mastoid air cells are clear. Bilateral lens replacement. Bilateral scleral buckle. Otherwise the orbits are unremarkable. Other: Partially visualized endotracheal tube. IMPRESSION: No acute intracranial abnormality. Electronically Signed   By: Morgane  Naveau M.D.   On: 09/14/2023 22:36   DG Abd Portable 1 View Result Date: 09/14/2023 CLINICAL DATA:  Enteric catheter placement EXAM: PORTABLE ABDOMEN - 1 VIEW COMPARISON:  None Available. FINDINGS:  Frontal view of the lower chest and upper abdomen demonstrates enteric catheter passing below diaphragm tip projecting over gastric body. Interstitial and ground-glass opacities throughout the lungs consistent with edema. Unremarkable bowel gas pattern. IMPRESSION: 1. Enteric catheter tip projects over gastric body. Electronically Signed   By: Bobbye Burrow M.D.   On: 09/14/2023 20:55   DG Chest Port 1 View Result Date: 09/14/2023 CLINICAL DATA:  Intubated, CHF, tachypnea EXAM: PORTABLE CHEST 1 VIEW COMPARISON:  09/14/2023  FINDINGS: Single frontal view of the chest demonstrates endotracheal tube overlying tracheal air column, tip of proximally 2 cm above carina. Dual lead pacemaker/AICD unchanged. Cardiac silhouette remains mildly enlarged. Stable interstitial and ground-glass opacities throughout the lungs. No large effusion or pneumothorax. No acute bony abnormalities. IMPRESSION: 1. No complication after intubation. 2. Stable findings of congestive heart failure. Electronically Signed   By: Bobbye Burrow M.D.   On: 09/14/2023 20:55   DG Chest Port 1 View Result Date: 09/14/2023 CLINICAL DATA:  Tachypnea.  CHF EXAM: PORTABLE CHEST 1 VIEW COMPARISON:  X-ray 01/14/2022 and older FINDINGS: Left upper chest battery pack for defibrillator with leads along the right side of the heart. Stable cardiopericardial silhouette. Tortuous aorta. Increasing vascular congestion and component of possible edema. No pneumothorax or effusion. No consolidation. Degenerative changes along the spine. Overlapping cardiac leads. IMPRESSION: Increasing vascular congestion and interstitial changes, possible edema. Defibrillator. Electronically Signed   By: Adrianna Horde M.D.   On: 09/14/2023 18:20    ECHO as above  TELEMETRY reviewed by me 09/22/2023: Sinus rhythm, rate 70s. (No further VT on tele)  EKG reviewed by me: Sinus tachycardia rate 144 bpm with ST elevation V2/V3. Repeat EKG ST changes resolved.   Data reviewed by me  09/22/2023: last 24h vitals tele labs imaging I/O critical care team notes, advanced heart failure notes.   Principal Problem:   Acute respiratory failure with hypoxia (HCC) Active Problems:   Sepsis (HCC)   Altered mental status   HFrEF (heart failure with reduced ejection fraction) (HCC)   Non-ST elevation MI (NSTEMI) (HCC)   Aspiration pneumonia of both lower lobes (HCC)    ASSESSMENT AND PLAN:  Blake [name] is a 73 y.o. male  with a past medical history of coronary artery disease s/p stent to LAD, ischemic cardiomyopathy s/p ICD ([employer]), chronic HFrEF, OSA (on CPAP), hx CVA who presented to the ED on 09/14/2023 for dyspnea, chest pain and AMS. Patient recently seen at urgent care for nonproductive cough, chills/diaphoresis Troponins found to be elevated.  Cardiology was consulted for further evaluation  # NSTEMI # Acute hypoxic respiratory failure  # Acute metabolic encephalopathy  # Sepsis # Acute on chronic HFrEF # Ischemic cardiomyopathy s/p ICD # Nonsustained VT Patient presented to ED with chest pain, dyspnea. Patient intubated for airway protection. BNP elevated at 570.  Troponins elevated and trending 100 > 7400 > 15300 > 15200. Sinus tachycardia rate 144 bpm with ST elevation V2/V3. Repeat EKG ST changes resolved.  Echo this admission with  rEF (35-40%), with anterolateral wall and apical lateral segment hypokinesis (unchanged from prior echo in 2020). Patient intubated and off Levophed  (05/30). BNP elevated 670 > 960. CXR with significant pulmonary edema. VT noted per tele on 05/30 and amio gtt started. Weight down 9lbs since admission and appears euvolemic. Per tele no evidence of further VT. -Completed 48 hrs of heparin  gtt for medical management of NSTEMI. -Monitor and replenish electrolytes for a goal K >4, Mag >2. -Continue PO amio 400 mg daily per tube.  -Continue aspirin  81 mg, atorvastatin  40 mg per tube daily. -ADHF team started spironolactone 12.5 mg  and metoprolol  succinate 25 mg daily. -Can give lasix  as needed for volume overload.  -Will not initiate SGLT2i at this time given risk of infection. -Consider resuming losartan prior to discharge if BP remains stable.  -Consider LHC when hemodynamically stable and extubated. Per ADHF no role for LHC as it will not change management. -Acute hypoxic  respiratory failure, acute metabolic encephalopathy, sepsis management per primary. -Advanced heart failure consulted, appreciate recommendations.    This patient's plan of care was discussed and created with Dr. Bob Burn and he is in agreement.  Signed: Creighton Doffing, PA-C  09/22/2023, 11:19 AM [facility]

## 2023-09-23 ENCOUNTER — Inpatient Hospital Stay: Admitting: Anesthesiology

## 2023-09-23 ENCOUNTER — Encounter
Admission: EM | Disposition: A | Discharge status: Nursing Home (Includes ICF) | Payer: Self-pay | Source: Home / Self Care | Attending: Internal Medicine

## 2023-09-23 ENCOUNTER — Other Ambulatory Visit: Payer: Self-pay

## 2023-09-23 DIAGNOSIS — J9601 Acute respiratory failure with hypoxia: Secondary | ICD-10-CM | POA: Diagnosis not present

## 2023-09-23 DIAGNOSIS — I5021 Acute systolic (congestive) heart failure: Secondary | ICD-10-CM | POA: Diagnosis not present

## 2023-09-23 DIAGNOSIS — A419 Sepsis, unspecified organism: Secondary | ICD-10-CM | POA: Diagnosis not present

## 2023-09-23 DIAGNOSIS — J9602 Acute respiratory failure with hypercapnia: Secondary | ICD-10-CM | POA: Diagnosis not present

## 2023-09-23 HISTORY — PX: TRACHEOSTOMY TUBE PLACEMENT: SHX814

## 2023-09-23 LAB — CBC
HCT: 41.4 % (ref 39.0–52.0)
Hemoglobin: 13.2 g/dL (ref 13.0–17.0)
MCH: 30.9 pg (ref 26.0–34.0)
MCHC: 31.9 g/dL (ref 30.0–36.0)
MCV: 97 fL (ref 80.0–100.0)
Platelets: 326 10*3/uL (ref 150–400)
RBC: 4.27 MIL/uL (ref 4.22–5.81)
RDW: 13.5 % (ref 11.5–15.5)
WBC: 8.9 10*3/uL (ref 4.0–10.5)
nRBC: 0 % (ref 0.0–0.2)

## 2023-09-23 LAB — BASIC METABOLIC PANEL WITH GFR
Anion gap: 7 (ref 5–15)
BUN: 34 mg/dL — ABNORMAL HIGH (ref 8–23)
CO2: 32 mmol/L (ref 22–32)
Calcium: 8.3 mg/dL — ABNORMAL LOW (ref 8.9–10.3)
Chloride: 103 mmol/L (ref 98–111)
Creatinine, Ser: 0.87 mg/dL (ref 0.61–1.24)
GFR, Estimated: >60 mL/min (ref >60)
Glucose, Bld: 108 mg/dL — ABNORMAL HIGH (ref 70–99)
Potassium: 4.3 mmol/L (ref 3.5–5.1)
Sodium: 142 mmol/L (ref 135–145)

## 2023-09-23 LAB — COOXEMETRY PANEL
Carboxyhemoglobin: 2.4 % — ABNORMAL HIGH (ref 0.5–1.5)
Methemoglobin: <0.7 % (ref 0.0–1.5)
O2 Saturation: 79.1 %
Total hemoglobin: 13.3 g/dL (ref 12.0–16.0)
Total oxygen content: 77.2 %

## 2023-09-23 LAB — GLUCOSE, CAPILLARY
Glucose-Capillary: 101 mg/dL — ABNORMAL HIGH (ref 70–99)
Glucose-Capillary: 111 mg/dL — ABNORMAL HIGH (ref 70–99)
Glucose-Capillary: 133 mg/dL — ABNORMAL HIGH (ref 70–99)
Glucose-Capillary: 156 mg/dL — ABNORMAL HIGH (ref 70–99)
Glucose-Capillary: 139 mg/dL — ABNORMAL HIGH (ref 70–99)
Glucose-Capillary: 123 mg/dL — ABNORMAL HIGH (ref 70–99)

## 2023-09-23 LAB — MAGNESIUM: Magnesium: 2.3 mg/dL (ref 1.7–2.4)

## 2023-09-23 LAB — PHOSPHORUS: Phosphorus: 3.5 mg/dL (ref 2.5–4.6)

## 2023-09-23 SURGERY — CREATION, TRACHEOSTOMY
Anesthesia: General | Site: Neck

## 2023-09-23 MED ORDER — KETAMINE HCL 50 MG/5ML IJ SOSY
PREFILLED_SYRINGE | INTRAMUSCULAR | Status: AC
  Filled 2023-09-23: qty 5

## 2023-09-23 MED ORDER — MIDAZOLAM HCL 2 MG/2ML IJ SOLN
INTRAMUSCULAR | Status: AC
  Filled 2023-09-23: qty 2

## 2023-09-23 MED ORDER — OXYCODONE HCL 5 MG PO TABS
5.0000 mg | ORAL_TABLET | Freq: Four times a day (QID) | ORAL | Status: DC
  Administered 2023-09-23 – 2023-09-28 (×20): 5 mg
  Filled 2023-09-23 (×20): qty 1

## 2023-09-23 MED ORDER — FENTANYL CITRATE (PF) 100 MCG/2ML IJ SOLN
INTRAMUSCULAR | Status: DC | PRN
Start: 2023-09-23 — End: 2023-09-23
  Administered 2023-09-23 (×2): 25 ug via INTRAVENOUS

## 2023-09-23 MED ORDER — PHENYLEPHRINE HCL-NACL 20-0.9 MG/250ML-% IV SOLN
INTRAVENOUS | Status: AC
  Filled 2023-09-23: qty 250

## 2023-09-23 MED ORDER — ENOXAPARIN SODIUM 40 MG/0.4ML IJ SOSY
40.0000 mg | PREFILLED_SYRINGE | INTRAMUSCULAR | Status: DC
  Administered 2023-09-24 – 2023-09-26 (×3): 40 mg via SUBCUTANEOUS
  Filled 2023-09-23 (×3): qty 0.4

## 2023-09-23 MED ORDER — DEXAMETHASONE SODIUM PHOSPHATE 10 MG/ML IJ SOLN
INTRAMUSCULAR | Status: DC | PRN
  Administered 2023-09-23: 10 mg via INTRAVENOUS

## 2023-09-23 MED ORDER — MIDAZOLAM HCL 2 MG/2ML IJ SOLN
INTRAMUSCULAR | Status: DC | PRN
  Administered 2023-09-23: 2 mg via INTRAVENOUS

## 2023-09-23 MED ORDER — SODIUM CHLORIDE 0.9 % IV SOLN
INTRAVENOUS | Status: DC | PRN
Start: 2023-09-23 — End: 2023-09-23

## 2023-09-23 MED ORDER — VITAL 1.5 CAL PO LIQD
1000.0000 mL | ORAL | Status: DC
  Administered 2023-09-23 – 2023-09-29 (×7): 1000 mL

## 2023-09-23 MED ORDER — METOPROLOL SUCCINATE ER 50 MG PO TB24: 25.0000 mg | ORAL_TABLET | Freq: Every day | ORAL | Status: DC

## 2023-09-23 MED ORDER — METOPROLOL TARTRATE 25 MG PO TABS
12.5000 mg | ORAL_TABLET | Freq: Two times a day (BID) | ORAL | Status: DC
  Administered 2023-09-23: 12.5 mg
  Filled 2023-09-23: qty 1

## 2023-09-23 MED ORDER — SPIRONOLACTONE 12.5 MG HALF TABLET
12.5000 mg | ORAL_TABLET | Freq: Every day | ORAL | Status: DC
  Administered 2023-09-24: 12.5 mg
  Filled 2023-09-23 (×2): qty 1

## 2023-09-23 MED ORDER — SEVOFLURANE IN SOLN
RESPIRATORY_TRACT | Status: AC
  Filled 2023-09-23: qty 250

## 2023-09-23 MED ORDER — PROSOURCE TF20 ENFIT COMPATIBL EN LIQD
60.0000 mL | Freq: Every day | ENTERAL | Status: DC
  Administered 2023-09-24 – 2023-09-29 (×6): 60 mL

## 2023-09-23 MED ORDER — KETAMINE HCL 50 MG/5ML IJ SOSY
PREFILLED_SYRINGE | INTRAMUSCULAR | Status: DC | PRN
  Administered 2023-09-23 (×2): 10 mg via INTRAVENOUS

## 2023-09-23 MED ORDER — ACETAMINOPHEN 500 MG PO TABS
1000.0000 mg | ORAL_TABLET | Freq: Four times a day (QID) | ORAL | Status: DC
  Administered 2023-09-23 – 2023-09-29 (×26): 1000 mg
  Filled 2023-09-23 (×27): qty 2

## 2023-09-23 MED ORDER — OXIDIZED CELLULOSE EX PADS
MEDICATED_PAD | CUTANEOUS | Status: DC | PRN
  Administered 2023-09-23: 1 via TOPICAL

## 2023-09-23 MED ORDER — METOPROLOL TARTRATE 25 MG PO TABS
12.5000 mg | ORAL_TABLET | Freq: Two times a day (BID) | ORAL | Status: DC
  Administered 2023-09-23 – 2023-09-24 (×3): 12.5 mg
  Filled 2023-09-23 (×3): qty 1

## 2023-09-23 MED ORDER — PROPOFOL 1000 MG/100ML IV EMUL
INTRAVENOUS | Status: AC
  Filled 2023-09-23: qty 100

## 2023-09-23 MED ORDER — LIDOCAINE-EPINEPHRINE 1 %-1:100000 IJ SOLN
INTRAMUSCULAR | Status: AC
Start: 2023-09-23 — End: ?
  Filled 2023-09-23: qty 1

## 2023-09-23 MED ORDER — CEFAZOLIN SODIUM-DEXTROSE 2-3 GM-%(50ML) IV SOLR
INTRAVENOUS | Status: DC | PRN
Start: 2023-09-23 — End: 2023-09-23
  Administered 2023-09-23: 2 g via INTRAVENOUS

## 2023-09-23 MED ORDER — FENTANYL CITRATE (PF) 100 MCG/2ML IJ SOLN
INTRAMUSCULAR | Status: AC
  Filled 2023-09-23: qty 2

## 2023-09-23 MED ORDER — LIDOCAINE-EPINEPHRINE 1 %-1:100000 IJ SOLN
INTRAMUSCULAR | Status: DC | PRN
Start: 2023-09-23 — End: 2023-09-23
  Administered 2023-09-23: 6 mL

## 2023-09-23 MED ORDER — ROCURONIUM BROMIDE 100 MG/10ML IV SOLN
INTRAVENOUS | Status: DC | PRN
  Administered 2023-09-23: 20 mg via INTRAVENOUS
  Administered 2023-09-23: 60 mg via INTRAVENOUS

## 2023-09-23 SURGICAL SUPPLY — 29 items
BLADE SURG 15 STRL LF DISP TIS (BLADE) ×1 IMPLANT
BLADE SURG SZ11 CARB STEEL (BLADE) ×1 IMPLANT
CORD BIP STRL DISP 12FT (MISCELLANEOUS) IMPLANT
ELECT CAUTERY NDL 2.0 MIC (NEEDLE) ×1 IMPLANT
ELECT CAUTERY NEEDLE 2.0 MIC (NEEDLE) ×1 IMPLANT
ELECTRODE CAUTERY BLDE TIP 2.5 (TIP) ×1 IMPLANT
ELECTRODE REM PT RTRN 9FT ADLT (ELECTROSURGICAL) ×1 IMPLANT
FORCEPS JEWEL BIP 4-3/4 STR (INSTRUMENTS) IMPLANT
GAUZE 4X4 16PLY ~~LOC~~+RFID DBL (SPONGE) ×1 IMPLANT
GLOVE BIO SURGEON STRL SZ7.5 (GLOVE) ×1 IMPLANT
GOWN STRL REUS W/ TWL LRG LVL3 (GOWN DISPOSABLE) ×3 IMPLANT
HEMOSTAT SURGICEL 2X3 (HEMOSTASIS) ×1 IMPLANT
LABEL OR SOLS (LABEL) ×1 IMPLANT
MANIFOLD NEPTUNE II (INSTRUMENTS) ×1 IMPLANT
NS IRRIG 500ML POUR BTL (IV SOLUTION) ×1 IMPLANT
PACK HEAD/NECK (MISCELLANEOUS) ×1 IMPLANT
SHEARS HARMONIC 9CM CVD (BLADE) IMPLANT
SOLUTION PREP PVP 2OZ (MISCELLANEOUS) ×1 IMPLANT
SPONGE DRAIN TRACH 4X4 STRL 2S (GAUZE/BANDAGES/DRESSINGS) ×1 IMPLANT
SPONGE KITTNER 5P (MISCELLANEOUS) ×1 IMPLANT
SPONGE VERSALON 4X4 4PLY (MISCELLANEOUS) IMPLANT
STAPLER SKIN PROX 35W (STAPLE) ×1 IMPLANT
SUCTION TUBE FRAZIER 10FR DISP (SUCTIONS) ×1 IMPLANT
SUT SILK 2 0 SH (SUTURE) ×2 IMPLANT
SUT SILK 2-0 18XBRD TIE 12 (SUTURE) IMPLANT
SUT VIC AB 4-0 PS2 18 (SUTURE) ×1 IMPLANT
SUT VIC AB 4-0 RB1 27X BRD (SUTURE) IMPLANT
TRAP FLUID SMOKE EVACUATOR (MISCELLANEOUS) ×1 IMPLANT
TUBE TRACH 6.0 CUFF FLEX (MISCELLANEOUS) IMPLANT

## 2023-09-23 NOTE — Consult Note (Signed)
 PHARMACY CONSULT NOTE - ELECTROLYTES  Pharmacy Consult for Electrolyte Monitoring and Replacement   Recent Labs: Potassium (mmol/L)  Date Value  09/23/2023 4.3  05/23/2014 4.1   Magnesium  (mg/dL)  Date Value  16/01/9603 2.3  09/13/2013 1.9   Calcium  (mg/dL)  Date Value  54/12/8117 8.3 (L)   Calcium , Total (mg/dL)  Date Value  14/78/2956 8.5   Albumin (g/dL)  Date Value  21/30/8657 2.8 (L)  05/23/2014 3.7   Phosphorus (mg/dL)  Date Value  84/69/6295 3.5   Sodium (mmol/L)  Date Value  09/23/2023 142  05/23/2014 143   Height: 5' 5.98" (167.6 cm) Weight: 84.3 kg (185 lb 13.6 oz) IBW/kg (Calculated) : 63.76 Estimated Creatinine Clearance: 74.7 mL/min (by C-G formula based on SCr of 0.87 mg/dL).  Assessment  Blake [name] is a 73 y.o. male presenting with acute respiratory failure and shock. 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 Pharmacy has been consulted to monitor and replace electrolytes.  Diet: NPO, intubated MIVF: N/A Pertinent medications: Amiodarone , Aldactone  Goal of Therapy: Electrolytes within normal limits  Plan:  Labs are stable. Patient is s/p PEG tube on 09/22/23. Pending tracheostomy 09/23/23 Labs tomorrow  Thank you for allowing pharmacy to be a part of this patient's care.  Page Boast 09/23/2023 8:11 AM

## 2023-09-23 NOTE — IPAL (Signed)
  Interdisciplinary Goals of Care Family Meeting   Date carried out: 09/23/2023  Location of the meeting: Bedside  Member's involved: Physician, Bedside Registered Nurse, and Family Member or next of kin    GOALS OF CARE DISCUSSION  The Clinical status was relayed to family in detail- Wife at bedside  Updated and notified of patients medical condition- Patient remains unresponsive and will not open eyes to command.   Patient with increased WOB and using accessory muscles to breathe Explained to family course of therapy and the modalities  Patient with Progressive multiorgan failure with a very high probablity of a very minimal chance of meaningful recovery despite all aggressive and optimal medical therapy.    PATIENT REMAINS FULL CODE  Family understands the situation. Severe Lung Damage Prolonged ICU LOS  Family are satisfied with Plan of action and management. All questions answered  Additional CC time 35 mins   Blake [name], M.D.  [facility]  Medical Director [facility] Medical Director [facility]io-Pulmonary Department

## 2023-09-23 NOTE — Progress Notes (Signed)
 Blake [name]       Patient ID: Blake [name] MRN: [id number] DOB/AGE: [birth year] 73 y.o.  Admit date: 09/14/2023 Referring Physician Dr. Vergia Glasgow Primary Physician Rory Collard, MD Primary Cardiologist Dr. Beau Bound Reason for Consultation Elevated trops, NSTEMI  HPI: Blake [name] is a 73 y.o. male  with a past medical history of coronary artery disease s/p stent to LAD, ischemic cardiomyopathy s/p ICD ([employer]), chronic HFrEF, OSA (on CPAP), hx CVA who presented to the ED on 09/14/2023 for dyspnea, chest pain and AMS. Patient recently seen at urgent care for nonproductive cough, chills/diaphoresis Troponins found to be elevated.  Cardiology was consulted for further evaluation.   Interval History: -Patient seen and examined this AM. Patient sedated with trach in place. BP and HR have remained stable.  -Overnight Tele showed no significant events. No further VT. -Patient appears euvolemic.  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   Coronary artery disease    Dementia (HCC)    Depression    Disorder of male genital organs 07/11/2013   Dyspnea    Encounter for long-term (current) use of medications 11/21/2014   Erectile dysfunction 07/11/2013   Executive function deficit 03/06/2016   GERD (gastroesophageal reflux disease)    Headache    stress   Hearing loss    Hyperlipidemia     Hypertension    Hypogonadism, 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   Major depression, recurrent, chronic (HCC) 03/06/2016   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   Mixed hyperlipidemia 02/02/2014   Myocardial infarction (HCC) 08/2013   OCD (obsessive compulsive disorder)    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   Other specified disorder of male genital organs(608.89) 07/11/2013   Prostatic hypertrophy    Pseudophakia of both eyes 04/20/2015   Raynaud's disease    Reduced libido 07/11/2013   Repeated falls    weak left ankle   Scoliosis    Stroke (HCC) 2/16   "light"   Synovitis    knees, ankles    Past Surgical History:  Procedure Laterality Date   BACK SURGERY  2011   rods and screws   CLOSED REDUCTION NASAL FRACTURE Bilateral 06/11/2018   Procedure: CLOSED REDUCTION NASAL FRACTURE;  Surgeon: [name], MD;  Location: [facility];  Service: ENT;  Laterality: Bilateral;   COLONOSCOPY  2015   CORONARY ANGIOPLASTY     2015 stent   CORONARY STENT INTERVENTION N/A 06/08/2019   Procedure: CORONARY STENT INTERVENTION;  Surgeon: Antonette Batters, MD;  Location: [facility];  Service: Cardiovascular;  Laterality: N/A;   CYSTOSCOPY  1984   EYE SURGERY Bilateral 2005,2006,2009   detached retina, cataract   FOOT ARTHRODESIS Left 05/30/2015   Procedure: ARTHRODESIS FOOT STJ LT FOOT;  Surgeon: Anell Baptist, DPM;  Location: [facility];  Service: Podiatry;  Laterality: Left;  WITH POPLITEAL BLOCK   FOOT SURGERY Left    HERNIA REPAIR Right 1969   inguinal   IR GASTROSTOMY TUBE MOD SED  09/22/2023   LEFT HEART CATH AND CORONARY ANGIOGRAPHY Left 06/08/2019   Procedure: LEFT HEART CATH AND CORONARY ANGIOGRAPHY;  Surgeon: Antonette Batters, MD;  Location: [facility];  Service: Cardiovascular;  Laterality: Left;   SEPTOPLASTY Bilateral 06/11/2018   Procedure: SEPTOPLASTY;   Surgeon: [name], MD;  Location: [facility];  Service: ENT;  Laterality: Bilateral;   SHOULDER SURGERY Right 2004   skull surgery     TOE SURGERY Right 2009   TONSILLECTOMY  2008    Medications Prior to Admission  Medication Sig Dispense Refill Last Dose/Taking   acetaminophen  (TYLENOL ) 500 MG tablet Take 1,000 mg by mouth every 6 (six) hours as needed for mild pain or moderate pain.   Unknown   aspirin  EC 81 MG tablet Take 81 mg by mouth daily.   09/14/2023 at  6:00 AM   B Complex-C (SUPER B COMPLEX PO) Take 1 tablet by mouth daily.   09/14/2023 Morning   Calcium  Carbonate-Vitamin D  600-200 MG-UNIT CAPS Take 1 tablet by mouth 2 (two) times daily.    09/14/2023 Morning   carvedilol  (COREG ) 3.125 MG tablet Take 3.125 mg by mouth daily.    09/14/2023 Morning   [EXPIRED] cefdinir (OMNICEF) 300 MG capsule Take 300 mg by mouth 2 (two) times daily.   09/14/2023 Noon   clindamycin  (CLEOCIN  T) 1 % lotion Apply 1 application topically 2 (two) times daily as needed.    Unknown   clopidogrel  (PLAVIX ) 75 MG tablet Take 75 mg by mouth daily.   09/14/2023 at  6:00 AM   divalproex  (DEPAKOTE  ER) 250 MG 24 hr tablet Total of 750 mg daily. Take along with 500 mg tab 30 tablet 0 09/14/2023 at  8:00 AM   divalproex  (DEPAKOTE  ER) 500 MG 24 hr tablet Take 1 tablet (500 mg total) by mouth daily. 30 tablet 1 09/14/2023 at  8:00 AM   DULoxetine  (CYMBALTA ) 60 MG capsule Take 60 mg by mouth 2 (two) times daily.    09/14/2023 at  3:30 AM   FIBER PO Take 1 tablet by mouth 2 (two) times daily.    09/14/2023 Morning   furosemide  (LASIX ) 20 MG tablet Take 20 mg by mouth daily.   09/14/2023 Morning   latanoprost (XALATAN) 0.005 % ophthalmic solution Place 1 drop into the left eye daily.   09/14/2023 Morning   losartan (COZAAR) 100 MG tablet Take 100 mg by mouth daily.   09/14/2023 Morning   Melatonin 5 MG TABS Take 10 mg by mouth at bedtime. 30 min before bed   09/13/2023 Bedtime   omeprazole (PRILOSEC) 20 MG capsule Take 20 mg by  mouth daily.   09/14/2023 Morning   Probiotic Product (PROBIOTIC DAILY PO) Take 1 tablet by mouth daily. Senior   09/14/2023 Morning   silodosin  (RAPAFLO ) 8 MG CAPS capsule TAKE 1 CAPSULE BY MOUTH DAILY WITH BREAKFAST (Patient taking differently: Take 8 mg by mouth daily.) 90 capsule 3 09/13/2023 at  6:00 PM   sodium chloride  (OCEAN) 0.65 % SOLN nasal spray Place 2 sprays into both nostrils as needed for congestion.   09/14/2023 Morning   tadalafil  (CIALIS ) 5 MG tablet Take 1 tablet by mouth once daily 90 tablet 0 09/14/2023 Morning   SYRINGE-NEEDLE, DISP, 3 ML (B-D 3CC LUER-LOK SYR 22GX1-1/2) 22G  X 1-1/2" 3 ML MISC USE AS DIRECTED WITH TESTOSTERONE  12 each 20    testosterone  cypionate (DEPOTESTOSTERONE CYPIONATE) 200 MG/ML injection Inject 0.3 mLs (60 mg total) into the muscle once a week. DISCARD REMAINING AS THESE ARE SINGLE USE VIALS. (Patient taking differently: Inject 60 mg into the muscle every 14 (fourteen) days. EVERY OTHER SATURDAY. DISCARD REMAINING AS THESE ARE SINGLE USE VIALS.) 5 mL 0    Social History   Socioeconomic History   Marital status: Married    Spouse name: cindy   Number of children: 1   Years of education: Not on file   Highest education level: Professional school degree (e.g., MD, DDS, DVM, JD)  Occupational History   Not on file  Tobacco Use   Smoking status: Never   Smokeless tobacco: Never  Vaping Use   Vaping status: Never Used  Substance and Sexual Activity   Alcohol use: No   Drug use: No   Sexual activity: Not Currently    Birth control/protection: None  Other Topics Concern   Not on file  Social History Narrative   Not on file   Social Drivers of Health   Financial Resource Strain: Not on file  Food Insecurity: No Food Insecurity (09/15/2023)   Hunger Vital Sign    Worried About Running Out of Food in the Last Year: Never true    Ran Out of Food in the Last Year: Never true  Transportation Needs: No Transportation Needs (09/15/2023)   PRAPARE -  Administrator, Civil Service (Medical): No    Lack of Transportation (Non-Medical): No  Physical Activity: Not on file  Stress: Not on file  Social Connections: Patient Unable To Answer (09/15/2023)   Social Connection and Isolation Panel [NHANES]    Frequency of Communication with Friends and Family: Patient unable to answer    Frequency of Social Gatherings with Friends and Family: Patient unable to answer    Attends Religious Services: Patient unable to answer    Active Member of Clubs or Organizations: Patient unable to answer    Attends Banker Meetings: Patient unable to answer    Marital Status: Patient unable to answer  Intimate Partner Violence: Unknown (09/15/2023)   Humiliation, Afraid, Rape, and Kick questionnaire    Fear of Current or Ex-Partner: Patient unable to answer    Emotionally Abused: Patient unable to answer    Physically Abused: Not on file    Sexually Abused: Patient unable to answer    Family History  Problem Relation Age of Onset   Asperger's syndrome Mother      Vitals:   09/23/23 0630 09/23/23 0700 09/23/23 0800 09/23/23 1049  BP: 139/78 120/82 (!) 152/93 125/71  Pulse: 77 79 73 82  Resp: 18 15 17    Temp:  98 F (36.7 C)    TempSrc:      SpO2: 93% 91% 100%   Weight:      Height:        PHYSICAL EXAM General: Chronically ill-appearing elderly male, well nourished, in no acute distress. HEENT: Normocephalic and atraumatic. Neck: No JVD.   Lungs: Trach in place.  Mechanical breath sounds bilaterally. Heart: HRRR. Normal S1 and S2 without gallops or murmurs.  Abdomen: Non-distended appearing.  Msk: Normal strength and tone for age. Extremities: Warm and well perfused. No clubbing, cyanosis. No edema.  Neuro: Alert and oriented X 3. Psych: Answers questions appropriately.   Labs: Basic Metabolic Panel: Recent Labs    09/21/23  9147 09/22/23 0418 09/23/23 0403  NA 146* 147* 142  K 3.3* 3.5 4.3  CL 98 101 103  CO2  38* 36* 32  GLUCOSE 159* 104* 108*  BUN 42* 39* 34*  CREATININE 0.88 0.80 0.87  CALCIUM  7.8* 7.9* 8.3*  MG 2.7*  --  2.3  PHOS 3.4  --  3.5   Liver Function Tests: No results for input(s): "AST", "ALT", "ALKPHOS", "BILITOT", "PROT", "ALBUMIN" in the last 72 hours.  No results for input(s): "LIPASE", "AMYLASE" in the last 72 hours. CBC: Recent Labs    09/22/23 0418 09/23/23 0403  WBC 7.9 8.9  HGB 12.3* 13.2  HCT 38.6* 41.4  MCV 97.5 97.0  PLT 278 326   Cardiac Enzymes: No results for input(s): "CKTOTAL", "CKMB", "CKMBINDEX", "TROPONINIHS" in the last 72 hours.  BNP: No results for input(s): "BNP" in the last 72 hours.  D-Dimer: No results for input(s): "DDIMER" in the last 72 hours. Hemoglobin A1C: No results for input(s): "HGBA1C" in the last 72 hours. Fasting Lipid Panel: Recent Labs    09/21/23 0458  TRIG 190*   Thyroid Function Tests: No results for input(s): "TSH", "T4TOTAL", "T3FREE", "THYROIDAB" in the last 72 hours.  Invalid input(s): "FREET3"  Anemia Panel: No results for input(s): "VITAMINB12", "FOLATE", "FERRITIN", "TIBC", "IRON", "RETICCTPCT" in the last 72 hours.   Radiology: IR GASTROSTOMY TUBE MOD SED Result Date: 09/22/2023 INDICATION: 73 year old with respiratory failure and on the ventilator. Request for placement of a gastrostomy tube. EXAM: PERCUTANEOUS GASTROSTOMY TUBE PLACEMENT WITH FLUOROSCOPIC GUIDANCE Physician: Olive Better. Henn, MD MEDICATIONS: Ancef  2 g; Antibiotics were administered within 1 hour of the procedure. Glucagon 1 mg ANESTHESIA/SEDATION: Patient was intubated and on propofol . Patient was monitored by the radiology nurse throughout the procedure. FLUOROSCOPY: Radiation Exposure Index (as provided by the fluoroscopic device): 43 mGy Kerma COMPLICATIONS: None immediate. PROCEDURE: Informed consent was obtained for a percutaneous gastrostomy tube. The patient was placed on the interventional table. Nasogastric tube was already in place. The  anterior abdomen was prepped and draped in sterile fashion. Maximal barrier sterile technique was utilized including caps, mask, sterile gowns, sterile gloves, sterile drape, hand hygiene and skin antiseptic. Stomach was insufflated with air through the nasogastric tube. Anterior abdomen was anesthetized using 1% lidocaine . Using fluoroscopic guidance, 2 Saf-T-Pexy T fasteners were deployed within the stomach. Incision was made between the T-fasteners. Needle was directed into the stomach between the T-fasteners using fluoroscopic guidance and a wire was advanced into the stomach. The tract was dilated to accommodate a 20 French peel-away sheath. An 14 French Entuit gastrostomy tube was advanced over the wire and through the peel-away sheath. Peel-away sheath was removed. The balloon was inflated with 10 mL of sterile water . Gastrostomy tube was injected with contrast to confirm placement in the stomach. Fluoroscopic images were taken and saved for this procedure. FINDINGS: Contrast injection confirms gastrostomy tube in the stomach. IMPRESSION: 1. Successful placement of a percutaneous gastrostomy tube using fluoroscopic guidance. 2. Plan for removal of the T-fasteners in 10-14 days. Electronically Signed   By: Elene Griffes M.D.   On: 09/22/2023 14:13   DG Chest Port 1 View Result Date: 09/20/2023 CLINICAL DATA:  73 year old male respiratory failure, intubated. EXAM: PORTABLE CHEST 1 VIEW COMPARISON:  Portable chest yesterday and earlier. FINDINGS: Portable AP upright view at 0942 hours. Stable lines and tubes. Left chest pacer/resuscitation pads persist. Stable lung volumes and mediastinal contours. Stable left chest AICD. Stable ventilation since yesterday. No pneumothorax, pulmonary edema. Patchy left  greater than right lung base opacity. Paucity of bowel gas in the upper abdomen. Stable visualized osseous structures. IMPRESSION: 1. Stable lines and tubes. 2. Stable ventilation since yesterday. Patchy left  greater than right lung base opacity. Electronically Signed   By: Marlise Simpers M.D.   On: 09/20/2023 10:26   DG Chest Port 1 View Result Date: 09/19/2023 CLINICAL DATA:  73 year old male with respiratory failure. EXAM: PORTABLE CHEST 1 VIEW COMPARISON:  Portable AP semi upright view at 0751 hours. FINDINGS: Portable AP semi upright view at 0751 hours. Less rotated to the left now. Pacer and resuscitation pads project over the lower chest. Stable left chest AICD. Endotracheal tube tip remains satisfactory. Stable lines and tubes. Stable low lung volumes, patchy perihilar and lung base opacity. Ventilation is stable with no pneumothorax, pleural effusion identified. And ventilation has improved compared to 09/17/2023. Stable cardiac size and mediastinal contours. Paucity of bowel gas now.  Stable visualized osseous structures. IMPRESSION: 1. Stable lines and tubes. 2. Stable ventilation since yesterday, improved since 09/17/2023. Stable low lung volumes and patchy perihilar and lung base opacity. Electronically Signed   By: Marlise Simpers M.D.   On: 09/19/2023 08:02   DG Abd 1 View Result Date: 09/18/2023 CLINICAL DATA:  Check gastric catheter placement EXAM: ABDOMEN - 1 VIEW COMPARISON:  09/17/2023 FINDINGS: Gastric catheter is noted coiled within the stomach. No free air is seen. Postsurgical changes in the lower lumbar spine are noted. No obstructive pattern is seen. IMPRESSION: Gastric catheter in the stomach. Electronically Signed   By: Violeta Grey M.D.   On: 09/18/2023 11:18   DG Chest Port 1 View Result Date: 09/18/2023 CLINICAL DATA:  Shortness of breath and chest pain EXAM: PORTABLE CHEST 1 VIEW COMPARISON:  09/17/2023 FINDINGS: Cardiac shadow is stable. Defibrillator is again noted and stable. Endotracheal 2 and gastric catheter are seen. Lungs are well aerated bilaterally. Improved aeration is noted with decrease in edematous changes. Some basilar opacities are again seen bilaterally. IMPRESSION: Improved  aeration when compared with the prior exam. Some persistent basilar opacities remain. Electronically Signed   By: Violeta Grey M.D.   On: 09/18/2023 11:09   DG Abd 1 View Result Date: 09/17/2023 CLINICAL DATA:  OG tube placement. EXAM: ABDOMEN - 1 VIEW COMPARISON:  09/14/2023 FINDINGS: OG tube tip is positioned in the mid stomach with proximal side port below the expected location of the GE junction. Nonspecific bowel gas pattern within the visualized abdomen. IMPRESSION: OG tube tip is positioned in the mid stomach. Electronically Signed   By: Donnal Fusi M.D.   On: 09/17/2023 06:42   DG Chest Port 1 View Result Date: 09/17/2023 CLINICAL DATA:  73 year old male intubated, central line placement, bilateral airspace opacity. EXAM: PORTABLE CHEST 1 VIEW COMPARISON:  Portable chest 09/15/2023 and earlier. FINDINGS: Portable AP supine view at 0610 hours. Endotracheal tube projects over the midline trachea in good position between the clavicles and carina. Enteric tube has been removed. Stable right IJ approach central line. Stable left chest AICD. Similar lung volumes but diffuse bilateral increased pulmonary opacity with combined reticulonodular and vague/airspace appearance. No pneumothorax or pleural effusion. No air bronchograms. Stable cardiac size and mediastinal contours. No acute osseous abnormality identified. Paucity of bowel gas in the visible abdomen. IMPRESSION: 1. Satisfactory ET tube. Enteric tube removed. Stable right IJ approach central line. 2. Increased bilateral pulmonary opacity from two days ago, differential considerations include progressive pulmonary edema, bilateral infection, ARDS. No pleural effusion is evident. Electronically Signed  By: Marlise Simpers M.D.   On: 09/17/2023 06:36   ECHOCARDIOGRAM COMPLETE Result Date: 09/16/2023    ECHOCARDIOGRAM REPORT   Patient Name:   DR. Hannah Lewis [name] Date of Exam: 09/15/2023 Medical Rec #:  [id number]             Height:       66.0 in Accession  #:    [id number]            Weight:       200.6 lb Date of Birth:  04/28/1950             BSA:          2.002 m Patient Age:    73 years              BP:           106/68 mmHg Patient Gender: M                     HR:           77 bpm. Exam Location:  [facility] Procedure: 2D Echo, Cardiac Doppler and Color Doppler (Both Spectral and Color            Flow Doppler were utilized during procedure). Indications:     Elevated Troponin  History:         Patient has no prior history of Echocardiogram examinations.                  CAD, Stroke, Signs/Symptoms:Dyspnea; Risk Factors:Hypertension                  and Dyslipidemia.  Sonographer:     Brigid Canada RDCS Referring Phys:  [id number] Delanna Fears Diagnosing Phys: Sabina Custovic IMPRESSIONS  1. Left ventricular ejection fraction, by estimation, is 35 to 40%. The left ventricle has moderately decreased function. The left ventricle demonstrates regional wall motion abnormalities (see scoring diagram/findings for description). Left ventricular  diastolic parameters are consistent with Grade I diastolic dysfunction (impaired relaxation).  2. Right ventricular systolic function is normal. The right ventricular size is normal. There is mildly elevated pulmonary artery systolic pressure. The estimated right ventricular systolic pressure is 42.7 mmHg.  3. The mitral valve is normal in structure. Moderate mitral valve regurgitation. No evidence of mitral stenosis.  4. The aortic valve is normal in structure. Aortic valve regurgitation is not visualized. No aortic stenosis is present.  5. The inferior vena cava is normal in size with greater than 50% respiratory variability, suggesting right atrial pressure of 3 mmHg. FINDINGS  Left Ventricle: Left ventricular ejection fraction, by estimation, is 35 to 40%. The left ventricle has moderately decreased function. The left ventricle demonstrates regional wall motion abnormalities. The left ventricular internal cavity size was  normal in size. There is no left ventricular hypertrophy. Left ventricular diastolic parameters are consistent with Grade I diastolic dysfunction (impaired relaxation).  LV Wall Scoring: The antero-lateral wall and apical lateral segment are hypokinetic. Right Ventricle: The right ventricular size is normal. No increase in right ventricular wall thickness. Right ventricular systolic function is normal. There is mildly elevated pulmonary artery systolic pressure. The tricuspid regurgitant velocity is 2.63  m/s, and with an assumed right atrial pressure of 15 mmHg, the estimated right ventricular systolic pressure is 42.7 mmHg. Left Atrium: Left atrial size was normal in size. Right Atrium: Right atrial size was normal in size. Pericardium: There is no evidence of pericardial effusion.  Mitral Valve: The mitral valve is normal in structure. Moderate mitral valve regurgitation. No evidence of mitral valve stenosis. Tricuspid Valve: The tricuspid valve is normal in structure. Tricuspid valve regurgitation is mild. The aortic valve is normal in structure. Aortic valve regurgitation is not visualized. No aortic stenosis is present. Pulmonic Valve: The pulmonic valve was normal in structure. Pulmonic valve regurgitation is not visualized. Aorta: The aortic root is normal in size and structure. Venous: The inferior vena cava is normal in size with greater than 50% respiratory variability, suggesting right atrial pressure of 3 mmHg. IAS/Shunts: No atrial level shunt detected by color flow Doppler.  LEFT VENTRICLE PLAX 2D LVIDd:         5.50 cm   Diastology LVIDs:         4.80 cm   LV e' medial:    6.71 cm/s LV PW:         1.00 cm   LV E/e' medial:  12.9 LV IVS:        1.00 cm   LV e' lateral:   12.83 cm/s LVOT diam:     2.30 cm   LV E/e' lateral: 6.8 LV SV:         63 LV SV Index:   32 LVOT Area:     4.15 cm  RIGHT VENTRICLE             IVC RV Basal diam:  3.70 cm     IVC diam: 2.30 cm RV S prime:     14.50 cm/s TAPSE  (M-mode): 2.5 cm LEFT ATRIUM             Index        RIGHT ATRIUM           Index LA diam:        4.70 cm 2.35 cm/m   RA Area:     12.60 cm LA Vol (A2C):   61.3 ml 30.61 ml/m  RA Volume:   31.10 ml  15.53 ml/m LA Vol (A4C):   36.9 ml 18.43 ml/m LA Biplane Vol: 48.5 ml 24.22 ml/m  AORTIC VALVE LVOT Vmax:   82.47 cm/s LVOT Vmean:  54.333 cm/s LVOT VTI:    0.153 m AI PHT:      376 msec  AORTA Ao Root diam: 3.20 cm Ao Asc diam:  3.70 cm MITRAL VALVE               TRICUSPID VALVE MV Area (PHT): 5.14 cm    TR Peak grad:   27.7 mmHg MV Decel Time: 148 msec    TR Vmax:        263.00 cm/s MV E velocity: 86.75 cm/s MV A velocity: 55.30 cm/s  SHUNTS MV E/A ratio:  1.57        Systemic VTI:  0.15 m                            Systemic Diam: 2.30 cm Lanell Pinta Custovic Electronically signed by Isabell Manzanilla Signature Date/Time: 09/16/2023/11:03:59 AM    Final    DG Chest Port 1 View Result Date: 09/15/2023 EXAM: 1 VIEW XRAY OF THE CHEST 09/15/2023 10:35:00 AM COMPARISON: 1 view chest x-ray 05/16/2023 at 2:38 PM. CLINICAL HISTORY: Endotracheally intubated. FINDINGS: LUNGS AND PLEURA: Mild edema and bilateral effusions are similar to the prior study. Bibasilar airspace opacity likely reflects atelectasis. HEART AND MEDIASTINUM: The heart is enlarged. BONES AND SOFT TISSUES: No  acute osseous abnormality. LINES AND TUBES: The endotracheal tube is stable, 3.5 cm above the carina. A right IJ line is stable. IMPRESSION: 1. Stable endotracheal tube and right IJ line. 2. Enlarged heart. 3. Mild edema and bilateral effusions, similar to the prior study. 4. Bibasilar airspace opacity, likely atelectasis. Electronically signed by: Audree Leas MD 09/15/2023 01:26 PM EDT RP Workstation: [id number]   DG Chest Port 1 View Result Date: 09/15/2023 CLINICAL DATA:  Central line placement EXAM: PORTABLE CHEST 1 VIEW COMPARISON:  Radiograph and CT 09/14/2023 FINDINGS: Stable cardiomegaly. Pulmonary vascular congestion and bilateral  ground-glass opacities. Small right pleural effusion and right basilar airspace opacities. No pneumothorax. Endotracheal tube tip in the intrathoracic trachea 3.0 cm from the carina. Subdiaphragmatic enteric tube. Right IJ CVC tip in the mid SVC. Left chest wall ICD. IMPRESSION: 1. Right IJ CVC tip in the mid SVC. No pneumothorax. 2. Similar findings of congestive heart failure. Electronically Signed   By: Rozell Cornet M.D.   On: 09/15/2023 02:52   CT Angio Chest PE W and/or Wo Contrast Result Date: 09/14/2023 CLINICAL DATA:  Cold symptoms, chest pain, short of breath, abdominal pain EXAM: CT ANGIOGRAPHY CHEST CT ABDOMEN AND PELVIS WITH CONTRAST TECHNIQUE: Multidetector CT imaging of the chest was performed using the standard protocol during bolus administration of intravenous contrast. Multiplanar CT image reconstructions and MIPs were obtained to evaluate the vascular anatomy. Multidetector CT imaging of the abdomen and pelvis was performed using the standard protocol during bolus administration of intravenous contrast. RADIATION DOSE REDUCTION: This exam was performed according to the departmental dose-optimization program which includes automated exposure control, adjustment of the mA and/or kV according to patient size and/or use of iterative reconstruction technique. CONTRAST:  OMNIPAQUE  IOHEXOL  350 MG/ML SOLN COMPARISON:  01/02/2010, 08/23/2013, 09/14/2023 FINDINGS: CTA CHEST FINDINGS Cardiovascular: This is a technically adequate evaluation of the pulmonary vasculature. No filling defects or pulmonary emboli. Mild cardiomegaly with left ventricular dilatation. No pericardial effusion. Dual lead cardiac pacer, proximal lead in the right atrium and distal lead in the right ventricle. 4.1 cm ascending thoracic aortic aneurysm. No evidence of dissection. Atherosclerosis of the aorta and coronary vasculature. Mediastinum/Nodes: Endotracheal tube terminates just above carina. Enteric catheter extends  into the gastric lumen. No pathologic adenopathy. Lungs/Pleura: There is dense bilateral perihilar airspace disease, greatest in the lower lobes. Trace bilateral pleural effusions. No pneumothorax. Central airways are patent. Musculoskeletal: No acute or destructive bony abnormalities. Reconstructed images demonstrate no additional findings. Review of the MIP images confirms the above findings. CT ABDOMEN and PELVIS FINDINGS Hepatobiliary: No focal liver abnormality is seen. No gallstones, gallbladder wall thickening, or biliary dilatation. Pancreas: Unremarkable. No pancreatic ductal dilatation or surrounding inflammatory changes. Spleen: Normal in size without focal abnormality. Adrenals/Urinary Tract: Adrenal glands are unremarkable. Kidneys are normal, without renal calculi, focal lesion, or hydronephrosis. The bladder is decompressed with an indwelling Foley catheter. Stomach/Bowel: No bowel obstruction or ileus. Normal appendix right lower quadrant. Scattered colonic diverticulosis without evidence of acute diverticulitis. Enteric catheter tip within the lumen of the gastric body. Vascular/Lymphatic: Aortic atherosclerosis. No enlarged abdominal or pelvic lymph nodes. Reproductive: Stable enlargement of the prostate. Other: No free fluid or free intraperitoneal gas. No abdominal wall hernia. Musculoskeletal: No acute or destructive bony abnormalities. Postsurgical changes from L2-L4 posterior fusion. Reconstructed images demonstrate no additional findings. Review of the MIP images confirms the above findings. IMPRESSION: Chest: 1. No evidence of pulmonary embolus. 2. Dense bilateral perihilar airspace disease, greatest in the lower lobes,  which could reflect widespread infection or edema. 3. Trace bilateral pleural effusions. 4. Aortic Atherosclerosis (ICD10-I70.0). Coronary artery atherosclerosis. 5. 4.1 cm ascending thoracic aortic aneurysm. Recommend annual imaging followup by CTA or MRA. This  recommendation follows 2010 ACCF/AHA/AATS/ACR/ASA/SCA/SCAI/SIR/STS/SVM Guidelines for the Diagnosis and Management of Patients with Thoracic Aortic Disease. Circulation. 2010; 121: Z610-R604. Aortic aneurysm NOS (ICD10-I71.9) Abdomen/pelvis: 1. No acute intra-abdominal or intrapelvic process. Normal appendix. 2. Distal colonic diverticulosis without diverticulitis. 3. Enlarged prostate. 4.  Aortic Atherosclerosis (ICD10-I70.0). Electronically Signed   By: Bobbye Burrow M.D.   On: 09/14/2023 22:40   CT ABDOMEN PELVIS W CONTRAST Result Date: 09/14/2023 CLINICAL DATA:  Cold symptoms, chest pain, short of breath, abdominal pain EXAM: CT ANGIOGRAPHY CHEST CT ABDOMEN AND PELVIS WITH CONTRAST TECHNIQUE: Multidetector CT imaging of the chest was performed using the standard protocol during bolus administration of intravenous contrast. Multiplanar CT image reconstructions and MIPs were obtained to evaluate the vascular anatomy. Multidetector CT imaging of the abdomen and pelvis was performed using the standard protocol during bolus administration of intravenous contrast. RADIATION DOSE REDUCTION: This exam was performed according to the departmental dose-optimization program which includes automated exposure control, adjustment of the mA and/or kV according to patient size and/or use of iterative reconstruction technique. CONTRAST:  OMNIPAQUE  IOHEXOL  350 MG/ML SOLN COMPARISON:  01/02/2010, 08/23/2013, 09/14/2023 FINDINGS: CTA CHEST FINDINGS Cardiovascular: This is a technically adequate evaluation of the pulmonary vasculature. No filling defects or pulmonary emboli. Mild cardiomegaly with left ventricular dilatation. No pericardial effusion. Dual lead cardiac pacer, proximal lead in the right atrium and distal lead in the right ventricle. 4.1 cm ascending thoracic aortic aneurysm. No evidence of dissection. Atherosclerosis of the aorta and coronary vasculature. Mediastinum/Nodes: Endotracheal tube terminates just  above carina. Enteric catheter extends into the gastric lumen. No pathologic adenopathy. Lungs/Pleura: There is dense bilateral perihilar airspace disease, greatest in the lower lobes. Trace bilateral pleural effusions. No pneumothorax. Central airways are patent. Musculoskeletal: No acute or destructive bony abnormalities. Reconstructed images demonstrate no additional findings. Review of the MIP images confirms the above findings. CT ABDOMEN and PELVIS FINDINGS Hepatobiliary: No focal liver abnormality is seen. No gallstones, gallbladder wall thickening, or biliary dilatation. Pancreas: Unremarkable. No pancreatic ductal dilatation or surrounding inflammatory changes. Spleen: Normal in size without focal abnormality. Adrenals/Urinary Tract: Adrenal glands are unremarkable. Kidneys are normal, without renal calculi, focal lesion, or hydronephrosis. The bladder is decompressed with an indwelling Foley catheter. Stomach/Bowel: No bowel obstruction or ileus. Normal appendix right lower quadrant. Scattered colonic diverticulosis without evidence of acute diverticulitis. Enteric catheter tip within the lumen of the gastric body. Vascular/Lymphatic: Aortic atherosclerosis. No enlarged abdominal or pelvic lymph nodes. Reproductive: Stable enlargement of the prostate. Other: No free fluid or free intraperitoneal gas. No abdominal wall hernia. Musculoskeletal: No acute or destructive bony abnormalities. Postsurgical changes from L2-L4 posterior fusion. Reconstructed images demonstrate no additional findings. Review of the MIP images confirms the above findings. IMPRESSION: Chest: 1. No evidence of pulmonary embolus. 2. Dense bilateral perihilar airspace disease, greatest in the lower lobes, which could reflect widespread infection or edema. 3. Trace bilateral pleural effusions. 4. Aortic Atherosclerosis (ICD10-I70.0). Coronary artery atherosclerosis. 5. 4.1 cm ascending thoracic aortic aneurysm. Recommend annual imaging  followup by CTA or MRA. This recommendation follows 2010 ACCF/AHA/AATS/ACR/ASA/SCA/SCAI/SIR/STS/SVM Guidelines for the Diagnosis and Management of Patients with Thoracic Aortic Disease. Circulation. 2010; 121: [id number]. Aortic aneurysm NOS (ICD10-I71.9) Abdomen/pelvis: 1. No acute intra-abdominal or intrapelvic process. Normal appendix. 2. Distal colonic diverticulosis without diverticulitis. 3. Enlarged  prostate. 4.  Aortic Atherosclerosis (ICD10-I70.0). Electronically Signed   By: Bobbye Burrow M.D.   On: 09/14/2023 22:40   CT HEAD WO CONTRAST ( ) Result Date: 09/14/2023 CLINICAL DATA:  Head trauma, minor (Age >= 65y) EXAM: CT HEAD WITHOUT CONTRAST TECHNIQUE: Contiguous axial images were obtained from the base of the skull through the vertex without intravenous contrast. RADIATION DOSE REDUCTION: This exam was performed according to the departmental dose-optimization program which includes automated exposure control, adjustment of the mA and/or kV according to patient size and/or use of iterative reconstruction technique. COMPARISON:  CT head 08/15/2022 FINDINGS: Brain: Bilateral anterior inferior frontal lobe and bilateral anterior temporal lobe encephalomalacia. Left cerebellar encephalomalacia. No evidence of large-territorial acute infarction. No parenchymal hemorrhage. No mass lesion. No extra-axial collection. No mass effect or midline shift. No hydrocephalus. Basilar cisterns are patent. Vascular: No hyperdense vessel. Atherosclerotic calcifications are present within the cavernous internal carotid and vertebral arteries. Skull: No acute fracture or focal lesion. Old left occipital skull fracture. Sinuses/Orbits: Paranasal sinuses and mastoid air cells are clear. Bilateral lens replacement. Bilateral scleral buckle. Otherwise the orbits are unremarkable. Other: Partially visualized endotracheal tube. IMPRESSION: No acute intracranial abnormality. Electronically Signed   By: Morgane  Naveau M.D.   On:  09/14/2023 22:36   DG Abd Portable 1 View Result Date: 09/14/2023 CLINICAL DATA:  Enteric catheter placement EXAM: PORTABLE ABDOMEN - 1 VIEW COMPARISON:  None Available. FINDINGS: Frontal view of the lower chest and upper abdomen demonstrates enteric catheter passing below diaphragm tip projecting over gastric body. Interstitial and ground-glass opacities throughout the lungs consistent with edema. Unremarkable bowel gas pattern. IMPRESSION: 1. Enteric catheter tip projects over gastric body. Electronically Signed   By: Bobbye Burrow M.D.   On: 09/14/2023 20:55   DG Chest Port 1 View Result Date: 09/14/2023 CLINICAL DATA:  Intubated, CHF, tachypnea EXAM: PORTABLE CHEST 1 VIEW COMPARISON:  09/14/2023 FINDINGS: Single frontal view of the chest demonstrates endotracheal tube overlying tracheal air column, tip of proximally 2 cm above carina. Dual lead pacemaker/AICD unchanged. Cardiac silhouette remains mildly enlarged. Stable interstitial and ground-glass opacities throughout the lungs. No large effusion or pneumothorax. No acute bony abnormalities. IMPRESSION: 1. No complication after intubation. 2. Stable findings of congestive heart failure. Electronically Signed   By: Bobbye Burrow M.D.   On: 09/14/2023 20:55   DG Chest Port 1 View Result Date: 09/14/2023 CLINICAL DATA:  Tachypnea.  CHF EXAM: PORTABLE CHEST 1 VIEW COMPARISON:  X-ray 01/14/2022 and older FINDINGS: Left upper chest battery pack for defibrillator with leads along the right side of the heart. Stable cardiopericardial silhouette. Tortuous aorta. Increasing vascular congestion and component of possible edema. No pneumothorax or effusion. No consolidation. Degenerative changes along the spine. Overlapping cardiac leads. IMPRESSION: Increasing vascular congestion and interstitial changes, possible edema. Defibrillator. Electronically Signed   By: Adrianna Horde M.D.   On: 09/14/2023 18:20    ECHO as above  TELEMETRY reviewed by me 09/23/2023:  Sinus rhythm, rate 70s. (No further VT on tele)  EKG reviewed by me: Sinus tachycardia rate 144 bpm with ST elevation V2/V3. Repeat EKG ST changes resolved.   Data reviewed by me 09/23/2023: last 24h vitals tele labs imaging I/O critical care team notes, advanced heart failure notes.   Principal Problem:   Acute respiratory failure with hypoxia (HCC) Active Problems:   Sepsis (HCC)   Altered mental status   HFrEF (heart failure with reduced ejection fraction) (HCC)   Non-ST elevation MI (NSTEMI) (HCC)   Aspiration  pneumonia of both lower lobes (HCC)    ASSESSMENT AND PLAN:  TOR TSUDA [name] is a 73 y.o. male  with a past medical history of coronary artery disease s/p stent to LAD, ischemic cardiomyopathy s/p ICD ([employer]), chronic HFrEF, OSA (on CPAP), hx CVA who presented to the ED on 09/14/2023 for dyspnea, chest pain and AMS. Patient recently seen at urgent care for nonproductive cough, chills/diaphoresis Troponins found to be elevated.  Cardiology was consulted for further evaluation  # NSTEMI # Acute hypoxic respiratory failure  # Acute metabolic encephalopathy  # Sepsis # Acute on chronic HFrEF # Ischemic cardiomyopathy s/p ICD # Nonsustained VT Patient presented to ED with chest pain, dyspnea. Patient intubated for airway protection. BNP elevated at 570.  Troponins elevated and trending 100 > 7400 > 15300 > 15200. Sinus tachycardia rate 144 bpm with ST elevation V2/V3. Repeat EKG ST changes resolved.  Echo this admission with  rEF (35-40%), with anterolateral wall and apical lateral segment hypokinesis (unchanged from prior echo in 2020). Patient intubated and off Levophed  (05/30). BNP elevated 670 > 960. CXR with significant pulmonary edema. VT noted per tele on 05/30 and amio gtt started. Patient appears euvolemic. Per tele no evidence of further VT. PEG placed (06/03) and trach placement (06/04) due to unable to wean from ventilator.  -Completed 48 hrs of heparin  gtt for  medical management of NSTEMI. -Monitor and replenish electrolytes for a goal K >4, Mag >2. -Continue PO amio 400 mg daily per tube.  -Continue aspirin  81 mg, atorvastatin  40 mg per tube daily. -Continue spironolactone 12.5 mg and metoprolol  succinate 25 mg daily. -Consider resuming losartan prior to discharge if BP remains stable.  -Can give lasix  as needed for volume overload.  -Will not initiate SGLT2i at this time given risk of infection. -No plan for LHC as this will not change patient's management. Continue medical management and optimizing GDMT. Discussed this with patient's daughter and son and they are agreeable/understanding. -Acute hypoxic respiratory failure, acute metabolic encephalopathy, sepsis management per primary. -Advanced heart failure consulted, appreciate recommendations.    This patient's plan of care was discussed and created with Dr. Bob Burn and he is in agreement.  Signed: Creighton Doffing, PA-C  09/23/2023, 11:53 AM [facility]

## 2023-09-23 NOTE — H&P (Signed)
 Pre-Op Note   Attending:      [name] Needle, MD, MBA, FARS                         Otolaryngology-Head & Neck Surgery   This patient was seen today in at the request of No referring provider defined for this encounter. in regard to     Chief Complaint  Patient presents with   Shortness of Breath   Chest Pain        CHIEF COMPLAINT:      Chief Complaint  Patient presents with   Shortness of Breath   Chest Pain        HPI:   The patient is a 73 y.o. old child who presents today with complaint of     Chief Complaint  Patient presents with   Shortness of Breath   Chest Pain    Blake [name] is a 73 y.o. 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     Acute hypoxemic respiratory failure: In setting of high fever, respiratory virus panel + for rhinovirus.  Suspect viral infection with secondary bacterial PNA (possible aspiration).  Also a significant component of pulmonary edema.  CXR much worse this morning when he was re-intubated.  Concern for ARDS based on appearance, but hope we can improve with diuresis.    ENT consulted for tracheotomy placement by the ICU team - Dr. Auston Left.  Recent INR = 1.1.  Platelets within normal limits.  Currently on q24 Lovenox .       REVIEW OF SYSTEMS: The patient / family denies any recent history of fever, night sweats or weight loss, pain, cyanosis, clubbing or edema, respiratory distress, dizziness or imbalance.     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   Chronic left shoulder pain 04/03/2016   Chronic prostatitis  07/11/2013   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   Coronary artery disease     Dementia (HCC)     Depression     Disorder of male genital organs 07/11/2013   Dyspnea     Encounter for long-term (current) use of medications 11/21/2014   Erectile dysfunction 07/11/2013   Executive function deficit 03/06/2016   GERD (gastroesophageal reflux disease)     Headache      stress   Hearing loss     Hyperlipidemia     Hypertension     Hypogonadism, 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   Major depression, recurrent, chronic (HCC) 03/06/2016   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   Mixed hyperlipidemia 02/02/2014   Myocardial infarction (HCC) 08/2013   OCD (obsessive compulsive disorder)     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   Other specified disorder of male genital organs(608.89) 07/11/2013   Prostatic hypertrophy     Pseudophakia of both eyes 04/20/2015   Raynaud's disease  Reduced libido 07/11/2013   Repeated falls      weak left ankle   Scoliosis     Stroke (HCC) 2/16    "light"   Synovitis      knees, ankles              SURGICAL HISTORY:      Past Surgical History:  Procedure Laterality Date   BACK SURGERY   2011    rods and screws   CLOSED REDUCTION NASAL FRACTURE Bilateral 06/11/2018    Procedure: CLOSED REDUCTION NASAL FRACTURE;  Surgeon: [name], MD;  Location: [facility] ORS;  Service: ENT;  Laterality: Bilateral;   COLONOSCOPY   2015   CORONARY ANGIOPLASTY        2015 stent   CORONARY STENT INTERVENTION N/A 06/08/2019    Procedure: CORONARY STENT INTERVENTION;  Surgeon: Antonette Batters, MD;  Location: [facility];  Service: Cardiovascular;  Laterality: N/A;   CYSTOSCOPY   1984   EYE SURGERY Bilateral 2005,2006,2009    detached retina, cataract   FOOT ARTHRODESIS Left 05/30/2015    Procedure: ARTHRODESIS FOOT STJ LT  FOOT;  Surgeon: Anell Baptist, DPM;  Location: [facility];  Service: Podiatry;  Laterality: Left;  WITH POPLITEAL BLOCK   FOOT SURGERY Left     HERNIA REPAIR Right 1969    inguinal   LEFT HEART CATH AND CORONARY ANGIOGRAPHY Left 06/08/2019    Procedure: LEFT HEART CATH AND CORONARY ANGIOGRAPHY;  Surgeon: Antonette Batters, MD;  Location: [facility];  Service: Cardiovascular;  Laterality: Left;   SEPTOPLASTY Bilateral 06/11/2018    Procedure: SEPTOPLASTY;  Surgeon: [name], MD;  Location: [facility] OR[facility];  Service: ENT;  Laterality: Bilateral;   SHOULDER SURGERY Right 2004   skull surgery       TOE SURGERY Right 2009   TONSILLECTOMY   2008            MEDICATIONS:  Current Medications    Current Facility-Administered Medications:    amiodarone  (PACERONE ) tablet 400 mg, 400 mg, Per Tube, Daily, Ramonita Burow, RPH, 400 mg at 09/21/23 1107   aspirin  chewable tablet 81 mg, 81 mg, Per Tube, Daily, Dgayli, Khabib, MD, 81 mg at 09/21/23 1107   atorvastatin  (LIPITOR) tablet 40 mg, 40 mg, Per Tube, Daily, Decoste, Gabriella, PA-C, 40 mg at 09/21/23 1107   Chlorhexidine  Gluconate Cloth 2 % PADS 6 each, 6 each, Topical, Daily, Dgayli, Khabib, MD, 6 each at 09/21/23 1000   docusate (COLACE) 50 MG/5ML liquid 100 mg, 100 mg, Per Tube, BID, Rust-Chester, Britton L, NP, 100 mg at 09/21/23 1108   enoxaparin  (LOVENOX ) injection 40 mg, 40 mg, Subcutaneous, Q24H, Lee, [place], NP, 40 mg at 09/21/23 0802   feeding supplement (PROSource TF20) liquid 60 mL, 60 mL, Per Tube, BID, Dgayli, Khabib, MD, 60 mL at 09/21/23 1108   feeding supplement (VITAL AF 1.2 CAL) liquid 1,000 mL, 1,000 mL, Per Tube, Continuous, Dgayli, Khabib, MD, Last Rate: 50 mL/hr at 09/21/23 1930, Infusion Verify at 09/21/23 1930   fentaNYL  (SUBLIMAZE ) injection 25-100 mcg, 25-100 mcg, Intravenous, Q30 min PRN, Rust-Chester, Britton L, NP, 50 mcg at 09/19/23 2209   free water  30 mL, 30 mL, Per Tube, Q4H, Dgayli, Khabib,  MD, 30 mL at 09/21/23 1945   ipratropium-albuterol  (DUONEB) 0.5-2.5 (3) MG/3ML nebulizer solution 3 mL, 3 mL, Nebulization, Q6H PRN, Ouma, Phoebe Breed, NP   ipratropium-albuterol  (DUONEB) 0.5-2.5 (3) MG/3ML nebulizer solution 3 mL, 3 mL, Nebulization, TID, Dgayli, Khabib,  MD, 3 mL at 09/21/23 1940   naphazoline-glycerin  (CLEAR EYES REDNESS) ophth solution 1-2 drop, 1-2 drop, Both Eyes, QID PRN, Rust-Chester, Britton L, NP, 2 drop at 09/19/23 2006   Oral care mouth rinse, 15 mL, Mouth Rinse, Q2H, Ouma, Phoebe Breed, NP, 15 mL at 09/21/23 1947   pantoprazole  (PROTONIX ) injection 40 mg, 40 mg, Intravenous, QHS, Dgayli, Khabib, MD, 40 mg at 09/20/23 2220   piperacillin-tazobactam (ZOSYN) IVPB 3.375 g, 3.375 g, Intravenous, Q8H, Kasa, Kurian, MD, Stopped at 09/21/23 1741   polyethylene glycol (MIRALAX  / GLYCOLAX ) packet 17 g, 17 g, Per Tube, BID, Dgayli, Khabib, MD, 17 g at 09/21/23 1108   propofol  (DIPRIVAN ) 1000 MG/100ML infusion, 0-50 mcg/kg/min, Intravenous, Continuous, Rust-Chester, Britton L, NP, Last Rate: 16.4 mL/hr at 09/21/23 1930, 30 mcg/kg/min at 09/21/23 1930   QUEtiapine (SEROQUEL) tablet 25 mg, 25 mg, Per Tube, BID, Kasa, Kurian, MD, 25 mg at 09/21/23 1107   sodium chloride  flush (NS) 0.9 % injection 10-40 mL, 10-40 mL, Intracatheter, Q12H, Dgayli, Khabib, MD, 10 mL at 09/21/23 1108   sodium chloride  flush (NS) 0.9 % injection 10-40 mL, 10-40 mL, Intracatheter, PRN, Darnelle Elders, Khabib, MD   valproic  acid (DEPAKENE ) 250 MG/5ML solution 250 mg, 250 mg, Per Tube, TID, Vergia Glasgow, MD, 250 mg at 09/21/23 1608       ALLERGIES: Allergies       Allergies  Allergen Reactions   Promethazine Other (See Comments)      Severe neuroleptic malignant syndrome requiring intubation   Mirtazapine Other (See Comments)      Other reaction(s): Other (See Comments) Irritability/anger, increased appetite, worsening depression Irritability/anger, increased appetite, worsening depression             BIRTH HX:  Term - no complications.     SOCIAL HISTORY: Social History         Socioeconomic History   Marital status: Married      Spouse name: cindy   Number of children: 1   Years of education: Not on file   Highest education level: Professional school degree (e.g., MD, DDS, DVM, JD)  Occupational History   Not on file  Tobacco Use   Smoking status: Never   Smokeless tobacco: Never  Vaping Use   Vaping status: Never Used  Substance and Sexual Activity   Alcohol use: No   Drug use: No   Sexual activity: Not Currently      Birth control/protection: None  Other Topics Concern   Not on file  Social History Narrative   Not on file    Social Drivers of Health        Financial Resource Strain: Not on file  Food Insecurity: No Food Insecurity (09/15/2023)    Hunger Vital Sign     Worried About Running Out of Food in the Last Year: Never true     Ran Out of Food in the Last Year: Never true  Transportation Needs: No Transportation Needs (09/15/2023)    PRAPARE - Therapist, art (Medical): No     Lack of Transportation (Non-Medical): No  Physical Activity: Not on file  Stress: Not on file  Social Connections: Patient Unable To Answer (09/15/2023)    Social Connection and Isolation Panel [NHANES]     Frequency of Communication with Friends and Family: Patient unable to answer     Frequency of Social Gatherings with Friends and Family: Patient unable to answer     Attends Religious Services: Patient unable to  answer     Active Member of Clubs or Organizations: Patient unable to answer     Attends Club or Organization Meetings: Patient unable to answer     Marital Status: Patient unable to answer  Intimate Partner Violence: Unknown (09/15/2023)    Humiliation, Afraid, Rape, and Kick questionnaire     Fear of Current or Ex-Partner: Patient unable to answer     Emotionally Abused: Patient unable to answer     Physically Abused: Not on file      Sexually Abused: Patient unable to answer        FAMILY HISTORY:      Family History  Problem Relation Age of Onset   Asperger's syndrome Mother              PHYSICAL EXAM: BP 134/73 (BP Location: Left Arm)   Pulse 68   Temp 98.3 F (36.8 C) (Axillary)   Resp 15   Ht 5' 5.98" (1.676 m)   Wt 83.3 kg   SpO2 98%   BMI 29.66 kg/m     Constitutional - please see medical record for recorded vital signs.  Patient intubated and on ventilatory support.    Head and Face - inspection of the head and face revealed the scalp to be normal and skin without scars, lesions or masses.  There was no evidence of peri-orbital edema or erythema, or palpable sinus tenderness.  There was no evidence of salivary gland tenderness or enlargement.  Facial strength appeared intact and symmetric bilaterally.   Eyes - pupils were equal, round and reactive to light.  Extra-ocular movements were intact and vision was grossly normal bilaterally.   Ears - The external ears were normal in appearance.  Otoscopic exam revealed the external auditory canals to be patent.  The tympanic membranes were clear bilaterally, with normal mobility on pneumatic otoscopy.  There was no evidence of middle ear fluid, perforation, drainage or acute infection.  Hearing was grossly intact and speech reception thresholds were grossly within normal limits.   Nose - The external nose was normal in appearance.  The nasal dorsum was midline.  Exam of the anterior nasal cavity revealed no evidence of purulent drainage, polyps or mass or mucosal lesions.  The septum was midline and the inferior turbinate were normal in size and appearance.     Oral cavity - The mucosa of the oral cavity and oropharynx was normal without mass or mucosal lesion, erythema or exudate.  ET tube in place.     Neck - The neck was nontender and without palpable adenopathy, crepitus or mass lesion.  The trachea was midline.  The thyroid exam revealed no  evidence of enlargement, tenderness or mass lesion.  Normal anatomy with no scars c/w previous neck surgery.     Repiratory - The patient was without respiratory distress, stridor or retractions.  Breath sounds were clear bilaterally.   Cardiovascular - Cardiovascular exam revealed a regular rate and rhythm with no evidence of murmur.  Extremeties without evidence of cyanosis, clubbing or edema.   Lymphatic System - there was no evidence of palpable adenopathy in the neck, supraclavicular fossae or axillae.   Neurologic - Cranial nerves II through XII were grossly intact bilaterally.  In particular the VII cranial nerve was intact and symmetric bilaterally.    IMAGING:     AUDIOLOGY:     Medical Decision Making   ASSESSMENT:   Respiratory distress with ventilator dependence       PLAN:  Will tentatively plan for tracheotomy this coming Wednesday am in the [facility] OR pending stable respiratory and hemodynamic status.  Discussed with team today.  Will contact patients wife to discuss further and obtain consent including all risks, benefits, and options.     Asked the team to hold patient's Lovenox  starting tomorrow morning through morning after surgery if possible.  Pt Preop for tracheostomy tube placement today with history of prior tracheotomy following an accident in the past.  Discussed risks, benefits, and options with patients wife at length yesterday including bleeding, infection, possible pneumothorax and possible death.  She understands, consents, and agrees to proceed.   [name] Needle, MD, MBA, [facility] Otolaryngology-Head & Neck Surgery [place] [facility] [phone]

## 2023-09-23 NOTE — TOC Progression Note (Signed)
 Transition of Care [facility]) - Progression Note    Patient Details  Name: Blake [name] MRN: [id number] Date of Birth: May 17, 1950  Transition of Care [facility]) CM/SW Contact  Crayton Docker, RN 09/23/2023, 3:23 PM  Clinical Narrative:     Tracheostomy, #6 cuffed,  placed today. PEG tube placement also pending.   Expected Discharge Plan and Services    TBD   Social Determinants of Health (SDOH) Interventions SDOH Screenings   Food Insecurity: No Food Insecurity (09/15/2023)  Housing: Low Risk  (09/15/2023)  Transportation Needs: No Transportation Needs (09/15/2023)  Utilities: Not At Risk (09/15/2023)  Depression (PHQ2-9): Low Risk  (08/04/2023)  Social Connections: Patient Unable To Answer (09/15/2023)  Tobacco Use: Low Risk  (09/20/2023)    Readmission Risk Interventions     No data to display

## 2023-09-23 NOTE — Op Note (Signed)
 OPERATIVE REPORT  Attending Physician: [name] Needle, MD, MBA, FARS    Otolaryngology-Head & Neck Surgery      Preoperative Diagnosis: Ventilatory dependence; failure to wean / extubate. Postoperative Diagnosis: Same.   Procedure(s) Performed:   Tracheotomy, CPT 31600   Teaching Surgeon:  [name] Needle, MD, MBA, FARS Assistants:  None   Anesthesia:  General Specimens:  None Drains:  6 cuffed tracheostomy tube Estimated Blood Loss:  10 mL  Operative Findings: Normal tracheal anatomy - history prior tracheotomy s/p decannulation several years ago following a trauma.  Procedure: After informed consent was obtained, the patient was brought from the ICU to the operating room and placed supine on the operating room table. After smooth induction of general anesthesia, a timeout was called and all parties were in agreement.   A shoulder roll was placed and landmarks marked.  The neck was injected with approximately 6 mL of 1% lidocaine  with 1:100,000 of epinephrine .  The neck was then prepped and draped in the usual sterile manner.    A horizontal 4 cm incision was then made using a 15 blade through the skin just below the level of the cricoid cartilage.  The dissection was carried down through the strap muscles using a Jake's hemostat and bipolar electrocautery.  Next, dissection continued to the level of the thyroid gland which was divided using monopolar cautery without difficulty.  The anterior tracheal wall was then identified and cleared of soft tissues using a Kitner sponge.  A Bjork-flap incision was then made through the anterior tracheal wall just superior to prior tracheotomy site, now healed, using a 15 blade and extended using Metzenbaum scissors.  The endotracheal tube was in view at this point and the patient ventilating well.  The Bjork-flap was then secured to the inferior skin margin using a single 4-0 Vicryl suture without difficulty.  The endotracheal tube was then pulled  back by Anesthesia and, once above the tracheotomy site, a #6 cuffed tracheostomy tube placed without difficulty.  The obturator was then removed and the inner cannula placed.  The tracheostomy tube was then connected to the ventilatory circuit and secured with 4 interrupted 2-0 silk sutures to the skin at each corner.  Some surgicel was placed adjacent to the tracheal wall margin and, finally, a gauze dressing soaked in betadyne placed under and around the tracheostomy tube flange.  Adequate hemostasis was assured.   The patient's care was turned over to the Anesthesia team who successfully transported the patient back to the ICU in stable condition.  All instrument, sharp and lap counts were correct at the end of the case.   Teaching Surgeon Attestation:  I was present and performed the entire procedure.  [name] Needle, MD, MBA, [facility] Otolaryngology-Head & Neck Surgery [place] [facility] [phone]

## 2023-09-23 NOTE — Progress Notes (Signed)
 Advanced Heart Failure Rounding Note  Cardiologist: None  Chief Complaint: Acute hypoxic respiratory failure Subjective:    Underwent trach this am  Now s/p trach/PEG.   Remains sedated on propofol . Vitals stable.   CVP 4-5 (now discontinued)  Objective:    Weight Range: 84.3 kg Body mass index is 30.01 kg/m.   Vital Signs:   Temp:  [97.7 F (36.5 C)-99.2 F (37.3 C)] 98 F (36.7 C) (06/04 0700) Pulse Rate:  [73-85] 82 (06/04 1049) Resp:  [13-33] 17 (06/04 0800) BP: (111-152)/(66-93) 125/71 (06/04 1049) SpO2:  [91 %-100 %] 98 % (06/04 1335) FiO2 (%):  [60 %] 60 % (06/04 1335) Weight:  [84.3 kg] 84.3 kg (06/04 0355) Last BM Date : 09/22/23  Weight change: Filed Weights   09/21/23 0500 09/22/23 0550 09/23/23 0355  Weight: 83.3 kg 83.4 kg 84.3 kg   Intake/Output:  Intake/Output Summary (Last 24 hours) at 09/23/2023 1353 Last data filed at 09/23/2023 0940 Gross per 24 hour  Intake 578.09 ml  Output 1165 ml  Net -586.91 ml    Physical Exam   General:  intubated/sedated HEENT: normal Neck: supple. no JVD. + trach Cor: Regular rate & rhythm. No rubs, gallops or murmurs. Lungs: clear Abdomen: soft, nontender, nondistended. + PEG. Good BS Extremities: no cyanosis, clubbing, rash, edema Neuro: intubated/sedated Telemetry   Sinus 60-80 Personally reviewed  Labs    CBC Recent Labs    09/22/23 0418 09/23/23 0403  WBC 7.9 8.9  HGB 12.3* 13.2  HCT 38.6* 41.4  MCV 97.5 97.0  PLT 278 326   Basic Metabolic Panel Recent Labs    65/78/46 0458 09/22/23 0418 09/23/23 0403  NA 146* 147* 142  K 3.3* 3.5 4.3  CL 98 101 103  CO2 38* 36* 32  GLUCOSE 159* 104* 108*  BUN 42* 39* 34*  CREATININE 0.88 0.80 0.87  CALCIUM  7.8* 7.9* 8.3*  MG 2.7*  --  2.3  PHOS 3.4  --  3.5   BNP (last 3 results) Recent Labs    09/14/23 1835 09/17/23 0523  BNP 573.5* 968.1*   Fasting Lipid Panel Recent Labs    09/21/23 0458  TRIG 190*   Imaging   No results  found.   Medications:    Scheduled Medications:  acetaminophen   1,000 mg Per Tube Q6H   amiodarone   400 mg Per Tube Daily   atorvastatin   40 mg Per Tube Daily   Chlorhexidine  Gluconate Cloth  6 each Topical Daily   docusate  100 mg Per Tube BID   [START ON 09/24/2023] enoxaparin  (LOVENOX ) injection  40 mg Subcutaneous Q24H   [START ON 09/24/2023] feeding supplement (PROSource TF20)  60 mL Per Tube Daily   free water   30 mL Per Tube Q4H   ipratropium-albuterol   3 mL Nebulization TID   metoprolol  tartrate  12.5 mg Per Tube BID   mouth rinse  15 mL Mouth Rinse Q2H   oxyCODONE   5 mg Per Tube Q6H   pantoprazole  (PROTONIX ) IV  40 mg Intravenous QHS   polyethylene glycol  17 g Per Tube BID   QUEtiapine  25 mg Per Tube BID   sodium chloride  flush  10-40 mL Intracatheter Q12H   [START ON 09/24/2023] spironolactone  12.5 mg Per Tube Daily   valproic  acid  250 mg Per Tube TID    Infusions:  feeding supplement (VITAL 1.5 CAL) 1,000 mL (09/23/23 1309)   propofol  (DIPRIVAN ) infusion 30.004 mcg/kg/min (09/23/23 1011)    PRN  Medications: fentaNYL  (SUBLIMAZE ) injection, ipratropium-albuterol , naphazoline-glycerin , sodium chloride  flush  Patient Profile   Blake [name] is a 73 y.o. 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   Assessment/Plan   1. Acute hypoxemic respiratory failure: In setting of high fever, respiratory virus panel + for rhinovirus.  Suspect viral infection with secondary bacterial PNA (possible aspiration).  Also a significant component of pulmonary edema.  CXR much worse this morning when he was re-intubated.  Concern for ARDS based on appearance, but hope we can improve with diuresis.  - now s/p Trach/PEG - CCM managing vent  2. Shock: Resolved. Initially primarily septic shock/shock related to  sedation (propofol ). Procalcitonin elevated at 3.43. Lactate has cleared. Off pressors.  -Resolved  3. Acute on chronic systolic CHF: Ischemic cardiomyopathy.  Echo this admission with EF 35-40% with normal RV and moderate MR, this is consistent with prior echoes. Initially, Lasix  was held and he had volume rescuscitation. CXR concerning for pulmonary edema.  -Volume status ok. Can give lasix  as needed - Continue spiro - Add Toprol  25 - No SGLT2i given risk of infection - ARB prior to d/c  4. ID: Rhinovirus infection, suspect secondary bacterial PNA with sepsis, initial procal 3.43,  -resolved  5. CAD: NSTEMI with [place]-TnI up to 15[address].  He did have some chest pain at time of admission.  Echo was unchanged with EF 35-40%.  He was in shock initially and has developed volume overload, but TnI seems high for demand ischemia alone.  Cannot rule out ACS with plaque rupture.  - Continue ASA 81 + atorva 40 mg daily - No role for coronary angio as it will not change management - Add Toprol   6. VT: noted on 5/30 during SBT.  - no further VT - Continue PO amio 400 mg daily.   7. Hypokalemia - supp as needed  AHF team will s/o. Call with questions.   Length of Stay: 9  CRITICAL CARE Performed by: Jules Oar  Total critical care time:40 mins  Critical care time was exclusive of separately billable procedures and treating other patients.  Critical care was necessary to treat or prevent imminent or life-threatening deterioration.  Critical care was time spent personally by me on the following activities: development of treatment plan with patient and/or surrogate as well as nursing, discussions with consultants, evaluation of patient's response to treatment, examination of patient, obtaining history from patient or surrogate, ordering and performing treatments and interventions, ordering and review of laboratory studies, ordering and review of radiographic studies, pulse oximetry and  re-evaluation of patient's condition.  Jules Oar, MD  09/23/2023, 1:53 PM  Advanced Heart Failure Team Pager [phone] (M-F; 7a - 5p)  Please contact [facility] for night-coverage after hours (5p -7a ) and weekends on [email or web address]

## 2023-09-23 NOTE — Interval H&P Note (Signed)
 History and Physical Interval Note:  09/23/2023 8:07 AM  Blake [name]  has presented today for surgery, with the diagnosis of Hypoxic respiratory failure with hypoxia.  The various methods of treatment have been discussed with the patient and family. After consideration of risks, benefits and other options for treatment, the patient has consented to  Procedure(s): CREATION, TRACHEOSTOMY (N/A) as a surgical intervention.  The patient's history has been reviewed, patient examined, no change in status, stable for surgery.  I have reviewed the patient's chart and labs.  Questions were answered to the patient's satisfaction.     [name]    No changes to H&P.   [name] Needle, MD, MBA, [facility] Otolaryngology-Head & Neck Surgery [facility] [phone]

## 2023-09-23 NOTE — Progress Notes (Signed)
 NAME:  KAYNEN MINNER, MRN:  [id number], DOB:  Feb 11, 1951, LOS: 9 ADMISSION DATE:  09/14/2023, CONSULTATION DATE:  09/14/23 REFERRING MD:  Dr. Peggi Bowels, CHIEF COMPLAINT:  Respiratory Failure, AMS  Brief Pt Description / Synopsis:  73 year old admitted with Acute Metabolic Encephalopathy and Acute Hypoxic Respiratory Failure in the setting of Rhinovirus infection and superimposed Community Acquired Pneumonia requiring intubation and mechanical ventilation.  Course complicated by NSTEMI, Acute on Chronic HFrEF, and Ventricular Tachycardia.  Has been unable to wean from the ventilator, now status post PEG tube placement on 6/3 and Tracheostomy placement on 6/4.  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   Per ed reports, and patient's wife who is at the bedside, Patient was seen at the urgent care for chief complaints of chills, body aches, intermittently and nonproductive coughing fits. He was diagnosed with acute upper respiratory infection and was sent home with promethazine-dextromethorphan and cefdinir (OMNICEF). [name] report that he took the prescription and went to bed. Later he woke up altered and delusional and c/o CP and SOB. He was noted to be sweating profusely with abnormal body temp per wife.   ED Course: Initial vital signs showed HR >180 beats/minute, BP >160  mm Hg, the RR 30s-40s breaths/minute, and the oxygen saturation 80s% on NRB and a temperature of 103.43F (39.6C).    Pertinent Labs/Diagnostics Findings: Na+/ K+:138/3.9  Glucose:127 CO2 21 WBC: unremarkable Lactic acid: 1.5~2.6 CK: COVID PCR: Negative troponin:106~7453   BNP:573.5  VBG: pO2 pend; pCO2 37; pH 7.49;  HCO3 28.2, %O2 Sat 43.7.  CXR> CTH> CTA Chest> CT Abd/pelvis>see report   Patient's agitation and restlessness worsen with HR up in the 170s to 180s.  2 mg of Ativan  was trialed but patient did not calm down. Given worsening symptoms and high risk for decompensation, patient intubated for airway protection. He was started on broad-spectrum antibiotics Ampicillin , vanc, Ceftriaxone  and Acyclovir  due to unclear source for possible infection and cover broadly to include treatment for meningitis. PCCM consulted for admission.  Please see "Significant Hospital Events" section below for full detailed hospital course.   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 and fell off, then he got back onto the horse and fell again on asphalt), HFrEF, ischemic cardiomyopathy, Hyperlipidemia, SVT, CAD, Depression, GERD, Hearing loss of both ears, MI, CVA, Hypertension, Hypotestosteronemia, ICD in place [facility] [id number] DEFIBRILLATOR, RESONATE EL DR DF4 S/N: [phone] implanted 11/21/2021), and OSA on CPA   Micro Data:  5/26: RVP>> + Rhinovirus 5/26: Strep pneumo and Legionella urinary antigens>> negative 5/26: Blood culture x 2>> no growth to date 5/26: Urine>> no growth 5/26: MRSA PCR>> negative 5/27: Tracheal aspirate>> no growth  Antimicrobials:   Anti-infectives (From admission, onward)    Start     Dose/Rate Route Frequency Ordered Stop  09/22/23 1400  ceFAZolin  (ANCEF ) IVPB 2g/100 mL premix        2 g 200 mL/hr over  30 Minutes Intravenous  Once 09/22/23 1308 09/22/23 1338   09/19/23 1400  piperacillin-tazobactam (ZOSYN) IVPB 3.375 g        3.375 g 12.5 mL/hr over 240 Minutes Intravenous Every 8 hours 09/19/23 1033 09/22/23 0148   09/19/23 1030  cefTRIAXone  (ROCEPHIN ) 2 g in sodium chloride  0.9 % 100 mL IVPB  Status:  Discontinued        2 g 200 mL/hr over 30 Minutes Intravenous Every 24 hours 09/18/23 1205 09/19/23 1033   09/16/23 0000  vancomycin  (VANCOREADY) IVPB 1750 mg/350 mL  Status:  Discontinued        1,750 mg 175 mL/hr over 120 Minutes Intravenous Every 24 hours 09/15/23 0207 09/15/23 0944   09/15/23 1100  cefTRIAXone  (ROCEPHIN ) 2 g in sodium chloride  0.9 % 100 mL IVPB  Status:  Discontinued        2 g 200 mL/hr over 30 Minutes Intravenous Every 12 hours 09/15/23 0134 09/15/23 0943   09/15/23 1030  azithromycin  (ZITHROMAX ) 500 mg in sodium chloride  0.9 % 250 mL IVPB        500 mg 250 mL/hr over 60 Minutes Intravenous Every 24 hours 09/15/23 0944 09/19/23 1224   09/15/23 1000  acyclovir  (ZOVIRAX ) 880 mg in dextrose  5 % 250 mL IVPB  Status:  Discontinued        10 mg/kg  88 kg 267.6 mL/hr over 60 Minutes Intravenous Every 8 hours 09/15/23 0211 09/15/23 0902   09/15/23 1000  cefTRIAXone  (ROCEPHIN ) 2 g in sodium chloride  0.9 % 100 mL IVPB  Status:  Discontinued        2 g 200 mL/hr over 30 Minutes Intravenous Every 24 hours 09/15/23 0943 09/18/23 1205   09/15/23 0400  ampicillin  (OMNIPEN) 2 g in sodium chloride  0.9 % 100 mL IVPB  Status:  Discontinued        2 g 300 mL/hr over 20 Minutes Intravenous Every 4 hours 09/15/23 0134 09/15/23 0902   09/15/23 0230  vancomycin  (VANCOCIN ) IVPB 1000 mg/200 mL premix  Status:  Discontinued        1,000 mg 200 mL/hr over 60 Minutes Intravenous  Once 09/15/23 0134 09/15/23 0145   09/14/23 2200  acyclovir  (ZOVIRAX ) 895 mg in dextrose  5 % 250 mL IVPB        10 mg/kg  89.4 kg 267.9 mL/hr over 60 Minutes Intravenous  Once 09/14/23 2033 09/15/23 0218    09/14/23 2100  ampicillin  (OMNIPEN) 2 g in sodium chloride  0.9 % 100 mL IVPB        2 g 300 mL/hr over 20 Minutes Intravenous  Once 09/14/23 2033 09/14/23 2136   09/14/23 2100  vancomycin  (VANCOREADY) IVPB 2000 mg/400 mL        2,000 mg 200 mL/hr over 120 Minutes Intravenous  Once 09/14/23 2037 09/15/23 0245   09/14/23 2045  cefTRIAXone  (ROCEPHIN ) 2 g in sodium chloride  0.9 % 100 mL IVPB        2 g 200 mL/hr over 30 Minutes Intravenous Once 09/14/23 2033 09/14/23 2351   09/14/23 2045  vancomycin  (VANCOCIN ) IVPB 1000 mg/200 mL premix  Status:  Discontinued        1,000 mg 200 mL/hr over 60 Minutes Intravenous  Once 09/14/23 2033 09/14/23 2037       Significant Hospital Events: Including procedures, antibiotic start and stop dates in addition to other pertinent events  5/26: Admitted to ICU with acute altered mental status and respiratory failure in the setting of suspected NMS versus sepsis requiring intubation 5/27: fever improved, continued on vasopressors 5/28: extubated in the afternoon. Respiratory failure overnight, re-intubated 5/29: vented, increased oxygen requirements. Family at the bedside. Heart failure consult placed 5/30: vent requirements improved, diuresing, sedated. Reina Cara with SBT 5/31: remains vented, WUA this AM 6/01: vented, sedated, afebrile 6/2 remains on vent 6/3 remains on vent, PEG tube placed by IR.   6/4:  No significant events overnight, afebrile, not requiring vasopressors.  Remains on vent: 60% FiO2, 5 PEEP. Tracheostomy placed by ENT.  Start scheduled oxycodone  and Tylenol  via PEG tube, start tube feeds.  Interim History / Subjective:  As outlined above under "Significant Hospital Events" section  Objective    Blood pressure 139/78, pulse 77, temperature 99.2 F (37.3 C), temperature source Axillary, resp. rate 18, height 5' 5.98" (1.676 m), weight 84.3 kg, SpO2 93%. CVP:  [4 mmHg-9 mmHg] 5 mmHg  Vent Mode: PRVC FiO2 (%):  [40 %-60 %] 60 % Set  Rate:  [15 bmp] 15 bmp Vt Set:  [500 mL] 500 mL PEEP:  [1 cmH20-5 cmH20] 5 cmH20 Plateau Pressure:  [10 cmH20-15 cmH20] 13 cmH20   Intake/Output Summary (Last 24 hours) at 09/23/2023 0749 Last data filed at 09/23/2023 0500 Gross per 24 hour  Intake 477.48 ml  Output 1535 ml  Net -1057.52 ml   Filed Weights   09/21/23 0500 09/22/23 0550 09/23/23 0355  Weight: 83.3 kg 83.4 kg 84.3 kg    Examination: General: Acutely ill-appearing male, laying in bed, intubated and sedated, no acute distress HENT: Atraumatic, normocephalic, neck supple, no JVD, orally intubated Lungs: Diminished mechanical breath sounds throughout, even, nonlabored, synchronous with the vent Cardiovascular: Regular rate and rhythm, S1-S2, no murmurs, rubs, gallops Abdomen: Soft, nontender, nondistended, no guarding or tenderness, bowel sounds positive x 4, PEG tube dressing clean dry and intact Extremities: Normal bulk and tone, no deformities, no edema Neuro: Sedated, withdraws from pain, but currently unable to follow commands GU: Foley catheter in place draining yellow urine  Resolved Hospital Problem list     Assessment & Plan:   #Acute Hypoxic Respiratory Failure due to ... #Rhinovirus Infection #Aspiration Pneumonia vs Community Acquired Pneumonia #Acute on Chronic HFrEF #OSA on CPAP Status post tracheostomy placement on 6/4 by ENT -Full vent support, implement lung protective strategies -Plateau pressures less than 30 cm H20 -Wean FiO2 & PEEP as tolerated to maintain O2 sats >92% -Follow intermittent Chest X-ray & ABG as needed -Spontaneous Breathing Trials when respiratory parameters met and mental status permits -Implement VAP Bundle -Prn Bronchodilators -ABX as above -Diuresis as BP and renal function permits  #Shock: Sepsis vs Cardiogenic ~ RESOLVED #Type II NSTEMI #Ventricular Tachycardia #Acute on Chronic HFrEF PMHx: CAD s/p PCI to LAD Echocardiogram 09/15/23: LVEF 35-40%, Grade I DD, RV  systolic function and RV size both normal, mildly elevated pulmonary artery systolic pressure (42.7 mmHg), moderate Mitral regurgitation -Continuous cardiac monitoring -Maintain MAP >65 -Vasopressors as needed to maintain MAP goal ~ Not requiring currently -HS Troponin peaked at 15,306 -Diuresis as BP and renal function permits ~ Advanced Heart Failure & Cardiology following, appreciate input -Continue Metoprolol  and Spironolactone as per Cardiology  #Aspiration Pneumonia vs Community Acquired Pneumonia ~ TREATED #Rhinovirus Infection -Monitor fever curve -Trend WBC's & Procalcitonin -Follow cultures as above -Completed course of ABX as above  #Acute Metabolic Encephalopathy #Sedation needs in setting of metabolic encephalopathy PMHx: Fronto-temporal Dementia -  Maintain a RASS goal of 0 to -1 -Propofol  to maintain RASS goal -Avoid sedating medications as able -Daily wake up assessment -Continue Seroquel and Valproic  Acid -Add scheduled Oxycodone  and Tylenol       Best Practice (right click and "Reselect all SmartList Selections" daily)   Diet/type: NPO DVT prophylaxis: SCD GI prophylaxis: PPI Lines: Central line and yes and it is still needed Foley:  Yes, and it is still needed Code Status:  full code Last date of multidisciplinary goals of care discussion [6/4]  6/4: Updated pt's wife at bedside on plan of care.  Labs   CBC: Recent Labs  Lab 09/17/23 0523 09/18/23 0430 09/19/23 0409 09/21/23 0926 09/22/23 0418 09/23/23 0403  WBC 9.4 9.3 6.9 6.4 7.9 8.9  NEUTROABS 7.6 7.6 5.6  --   --   --   HGB 13.1 12.5* 12.3* 11.8* 12.3* 13.2  HCT 39.4 36.9* 38.1* 36.1* 38.6* 41.4  MCV 96.3 95.1 97.4 97.3 97.5 97.0  PLT 170 173 179 235 278 326    Basic Metabolic Panel: Recent Labs  Lab 09/18/23 0430 09/18/23 1348 09/19/23 0409 09/20/23 0437 09/21/23 0458 09/22/23 0418 09/23/23 0403  NA 143 144 143 149* 146* 147* 142  K 4.2 3.9 3.8 3.3* 3.3* 3.5 4.3  CL 106 105  104 103 98 101 103  CO2 25 29 29  35* 38* 36* 32  GLUCOSE 145* 160* 157* 164* 159* 104* 108*  BUN 38* 43* 46* 42* 42* 39* 34*  CREATININE 1.09 1.03 1.02 0.89 0.88 0.80 0.87  CALCIUM  7.6* 7.2* 7.5* 7.6* 7.8* 7.9* 8.3*  MG 2.6* 2.3  --  2.7* 2.7*  --  2.3  PHOS 3.4  --  2.5 2.2* 3.4  --  3.5   GFR: Estimated Creatinine Clearance: 74.7 mL/min (by C-G formula based on SCr of 0.87 mg/dL). Recent Labs  Lab 09/18/23 0429 09/18/23 0430 09/19/23 0409 09/20/23 0437 09/21/23 0926 09/22/23 0418 09/23/23 0403  PROCALCITON 2.99  --  1.78 1.20  --   --   --   WBC  --    < > 6.9  --  6.4 7.9 8.9   < > = values in this interval not displayed.    Liver Function Tests: No results for input(s): "AST", "ALT", "ALKPHOS", "BILITOT", "PROT", "ALBUMIN" in the last 168 hours. No results for input(s): "LIPASE", "AMYLASE" in the last 168 hours. No results for input(s): "AMMONIA" in the last 168 hours.  ABG    Component Value Date/Time   PHART 7.44 09/19/2023 0943   PCO2ART 51 (H) 09/19/2023 0943   PO2ART 84 09/19/2023 0943   HCO3 34.6 (H) 09/19/2023 0943   ACIDBASEDEF 0.2 09/17/2023 0834   O2SAT 79.1 09/23/2023 0403     Coagulation Profile: Recent Labs  Lab 09/21/23 1437  INR 1.1    Cardiac Enzymes: Recent Labs  Lab 09/17/23 0523  CKTOTAL 198    HbA1C: No results found for: "HGBA1C"  CBG: Recent Labs  Lab 09/22/23 1631 09/22/23 1913 09/22/23 2345 09/23/23 0344 09/23/23 0740  GLUCAP 112* 90 104* 101* 111*    Review of Systems:   Unable to assess due to intubation/sedation/critical illness  Past Medical History:  He,  has a past medical history of 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), Asperger's syndrome, 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), CAD (coronary artery disease) (09/21/2013), 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), Chronic left shoulder pain (04/03/2016), 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), Cognitive deficit as late effect of traumatic brain  injury (HCC) (03/06/2016), Coronary artery disease,  Dementia (HCC), Depression, Disorder of male genital organs (07/11/2013), Dyspnea, Encounter for long-term (current) use of medications (11/21/2014), Erectile dysfunction (07/11/2013), Executive function deficit (03/06/2016), GERD (gastroesophageal reflux disease), Headache, Hearing loss, Hyperlipidemia, Hypertension, Hypogonadism, male (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   Surgical History:   Past Surgical History:  Procedure Laterality Date   BACK SURGERY  2011   rods and screws   CLOSED REDUCTION NASAL FRACTURE Bilateral 06/11/2018   Procedure: CLOSED REDUCTION NASAL FRACTURE;  Surgeon: [name], MD;  Location: [facility];  Service: ENT;  Laterality: Bilateral;   COLONOSCOPY  2015   CORONARY ANGIOPLASTY     2015 stent   CORONARY STENT INTERVENTION N/A 06/08/2019   Procedure: CORONARY STENT INTERVENTION;  Surgeon: Antonette Batters, MD;  Location: [facility];  Service: Cardiovascular;  Laterality: N/A;   CYSTOSCOPY  1984   EYE SURGERY Bilateral 2005,2006,2009   detached retina, cataract   FOOT ARTHRODESIS Left 05/30/2015   Procedure: ARTHRODESIS FOOT STJ LT FOOT;  Surgeon: Anell Baptist, DPM;  Location: [facility];  Service: Podiatry;  Laterality: Left;  WITH POPLITEAL BLOCK   FOOT SURGERY Left    HERNIA REPAIR Right 1969   inguinal   IR GASTROSTOMY TUBE MOD SED  09/22/2023   LEFT HEART CATH AND CORONARY ANGIOGRAPHY Left  06/08/2019   Procedure: LEFT HEART CATH AND CORONARY ANGIOGRAPHY;  Surgeon: Antonette Batters, MD;  Location: [facility];  Service: Cardiovascular;  Laterality: Left;   SEPTOPLASTY Bilateral 06/11/2018   Procedure: SEPTOPLASTY;  Surgeon: [name], MD;  Location: [facility];  Service: ENT;  Laterality: Bilateral;   SHOULDER SURGERY Right 2004   skull surgery     TOE SURGERY Right 2009   TONSILLECTOMY  2008     Social History:   reports that he has never smoked. He has never used smokeless tobacco. He reports that he does not drink alcohol and does not use drugs.   Family History:  His family history includes Asperger's syndrome in his mother.   Allergies Allergies  Allergen Reactions   Promethazine Other (See Comments)    Severe neuroleptic malignant syndrome requiring intubation   Mirtazapine Other (See Comments)    Other reaction(s): Other (See Comments) Irritability/anger, increased appetite, worsening depression Irritability/anger, increased appetite, worsening depression      Home Medications  Prior to Admission medications   Medication Sig Start Date End Date Taking? Authorizing Provider  acetaminophen  (TYLENOL ) 500 MG tablet Take 1,000 mg by mouth every 6 (six) hours as needed for mild pain or moderate pain.   Yes [provider]  aspirin  EC 81 MG tablet Take 81 mg by mouth daily.   Yes [provider]  B Complex-C (SUPER B COMPLEX PO) Take 1 tablet by mouth daily.   Yes [provider]  Calcium  Carbonate-Vitamin D  600-200 MG-UNIT CAPS Take 1 tablet by mouth 2 (two) times daily.    Yes [provider]  carvedilol  (COREG ) 3.125 MG tablet Take 3.125 mg by mouth daily.  11/03/18  Yes [provider]  clindamycin  (CLEOCIN  T) 1 % lotion Apply 1 application topically 2 (two) times daily as needed.    Yes  [provider]  clopidogrel  (PLAVIX ) 75 MG tablet Take 75 mg by mouth daily. 09/27/18  Yes [provider]  divalproex  (DEPAKOTE  ER) 250 MG 24 hr tablet Total of 750 mg daily. Take along with 500 mg tab 08/24/23  Yes Hisada, Ivan Marion, MD  divalproex  (DEPAKOTE  ER) 500 MG 24 hr tablet Take 1 tablet (500 mg total) by mouth daily. 08/13/23 10/12/23 Yes Todd Fossa, MD  DULoxetine  (CYMBALTA ) 60 MG capsule Take 60 mg by mouth 2 (two) times daily.  11/17/18  Yes [provider]  FIBER PO Take 1 tablet by mouth 2 (two) times daily.    Yes [provider]  furosemide  (LASIX ) 20 MG tablet Take 20 mg by mouth daily. 11/03/18  Yes [provider]  latanoprost (XALATAN) 0.005 % ophthalmic solution Place 1 drop into the left eye daily. 11/06/21  Yes [provider]  losartan (COZAAR) 100 MG tablet Take 100 mg by mouth daily.   Yes [provider]  Melatonin 5 MG TABS Take 10 mg by mouth at bedtime. 30 min before bed   Yes [provider]  omeprazole (PRILOSEC) 20 MG capsule Take 20 mg by mouth daily.   Yes [provider]  Probiotic Product (PROBIOTIC DAILY PO) Take 1 tablet by mouth daily. Senior   Yes [provider]  silodosin  (RAPAFLO ) 8 MG CAPS capsule TAKE 1 CAPSULE BY MOUTH DAILY WITH BREAKFAST Patient taking differently: Take 8 mg by mouth daily. 03/16/23  Yes Stoioff, Kizzie Perks, MD  sodium chloride  (OCEAN) 0.65 % SOLN nasal spray Place 2 sprays into both nostrils as needed for congestion.   Yes [provider]  tadalafil  (CIALIS ) 5 MG tablet Take 1 tablet by mouth once daily 08/03/23  Yes Stoioff, Scott C, MD  SYRINGE-NEEDLE, DISP, 3 ML (B-D 3CC LUER-LOK SYR 22GX1-1/2) 22G X 1-1/2" 3 ML MISC USE AS DIRECTED WITH TESTOSTERONE  08/24/23   Stoioff, Kizzie Perks, MD  testosterone  cypionate (DEPOTESTOSTERONE CYPIONATE) 200 MG/ML injection Inject 0.3 mLs (60 mg total) into the muscle once a week. DISCARD REMAINING AS THESE ARE SINGLE USE VIALS. Patient taking differently: Inject 60 mg into the muscle every 14 (fourteen) days. EVERY OTHER  SATURDAY. DISCARD REMAINING AS THESE ARE SINGLE USE VIALS. 06/16/23   Geraline Knapp, MD     Critical care time: 40 minutes     Cherylann Corpus, AGACNP-BC [facility] Prefer epic messenger for cross cover needs If after hours, please call E-link

## 2023-09-23 NOTE — Transfer of Care (Addendum)
 Immediate Anesthesia Transfer of Care Note  Patient: Blake [name]  Procedure(s) Performed: CREATION, TRACHEOSTOMY (Neck)  Patient Location: ICU  Anesthesia Type:General  Level of Consciousness: sedated  Airway & Oxygen Therapy: Patient connected to tracheostomy mask oxygen and Patient placed on Ventilator (see vital sign flow sheet for setting)  Post-op Assessment: Report given to RN and Post -op Vital signs reviewed and stable  Post vital signs: Reviewed and stable  Last Vitals:  Vitals Value Taken Time  BP 149/76 0945  Temp 36.2 0945  Pulse 84 0945  Resp 15 0945  SpO2 99 00945    Last Pain:  Vitals:   09/23/23 0400  TempSrc: Axillary  PainSc:     Reported to ICU RN. VSS. No anesthesia complications noted. RT available to immediately attached pt. To ventilator on arrival to room.      Complications: No notable events documented.

## 2023-09-23 NOTE — Progress Notes (Signed)
 Brief Interventional Radiology Note:  Patient seen for g-tube site s/p placement of 18Fr balloon retention gastrostomy tube on 09/22/23 in IR with Dr. Irine Manning   Insertion site is clean, dry, dressed and appropriately tender to palpation. No bleeding or leakage. Per orders, staff did not use for tube feeds until 4hrs post placement.   Ok to begin/continue using g-tube for medicines, tube feeds, free water , etc.  Please call IR for any questions or concerns regarding the g-tube.  Electronically Signed: Kajah Santizo M Clarisa Danser, PA-C 09/23/2023, 4:25 PM

## 2023-09-24 ENCOUNTER — Ambulatory Visit: Admitting: Psychiatry

## 2023-09-24 ENCOUNTER — Other Ambulatory Visit: Payer: Self-pay

## 2023-09-24 DIAGNOSIS — I5021 Acute systolic (congestive) heart failure: Secondary | ICD-10-CM | POA: Diagnosis not present

## 2023-09-24 DIAGNOSIS — J9601 Acute respiratory failure with hypoxia: Secondary | ICD-10-CM | POA: Diagnosis not present

## 2023-09-24 DIAGNOSIS — J9602 Acute respiratory failure with hypercapnia: Secondary | ICD-10-CM | POA: Diagnosis not present

## 2023-09-24 DIAGNOSIS — A419 Sepsis, unspecified organism: Secondary | ICD-10-CM | POA: Diagnosis not present

## 2023-09-24 LAB — CBC
HCT: 36.7 % — ABNORMAL LOW (ref 39.0–52.0)
Hemoglobin: 12.1 g/dL — ABNORMAL LOW (ref 13.0–17.0)
MCH: 31.8 pg (ref 26.0–34.0)
MCHC: 33 g/dL (ref 30.0–36.0)
MCV: 96.3 fL (ref 80.0–100.0)
Platelets: 306 10*3/uL (ref 150–400)
RBC: 3.81 MIL/uL — ABNORMAL LOW (ref 4.22–5.81)
RDW: 13.2 % (ref 11.5–15.5)
WBC: 11.1 10*3/uL — ABNORMAL HIGH (ref 4.0–10.5)
nRBC: 0 % (ref 0.0–0.2)

## 2023-09-24 LAB — GLUCOSE, CAPILLARY
Glucose-Capillary: 118 mg/dL — ABNORMAL HIGH (ref 70–99)
Glucose-Capillary: 128 mg/dL — ABNORMAL HIGH (ref 70–99)
Glucose-Capillary: 132 mg/dL — ABNORMAL HIGH (ref 70–99)
Glucose-Capillary: 142 mg/dL — ABNORMAL HIGH (ref 70–99)
Glucose-Capillary: 150 mg/dL — ABNORMAL HIGH (ref 70–99)
Glucose-Capillary: 161 mg/dL — ABNORMAL HIGH (ref 70–99)

## 2023-09-24 LAB — CULTURE, BLOOD (ROUTINE X 2)
Culture: NO GROWTH
Culture: NO GROWTH

## 2023-09-24 LAB — BASIC METABOLIC PANEL WITH GFR
Anion gap: 5 (ref 5–15)
BUN: 39 mg/dL — ABNORMAL HIGH (ref 8–23)
CO2: 31 mmol/L (ref 22–32)
Calcium: 8.2 mg/dL — ABNORMAL LOW (ref 8.9–10.3)
Chloride: 106 mmol/L (ref 98–111)
Creatinine, Ser: 0.9 mg/dL (ref 0.61–1.24)
GFR, Estimated: >60 mL/min (ref >60)
Glucose, Bld: 143 mg/dL — ABNORMAL HIGH (ref 70–99)
Potassium: 4.2 mmol/L (ref 3.5–5.1)
Sodium: 142 mmol/L (ref 135–145)

## 2023-09-24 LAB — PHOSPHORUS: Phosphorus: 2.5 mg/dL (ref 2.5–4.6)

## 2023-09-24 LAB — TRIGLYCERIDES: Triglycerides: 150 mg/dL — ABNORMAL HIGH (ref <150)

## 2023-09-24 LAB — COOXEMETRY PANEL
Carboxyhemoglobin: 1.6 % — ABNORMAL HIGH (ref 0.5–1.5)
Methemoglobin: 0.7 % (ref 0.0–1.5)
O2 Saturation: 74.7 %
Total hemoglobin: 12.5 g/dL (ref 12.0–16.0)
Total oxygen content: 73.1 %

## 2023-09-24 LAB — MAGNESIUM: Magnesium: 2.6 mg/dL — ABNORMAL HIGH (ref 1.7–2.4)

## 2023-09-24 MED ORDER — LATANOPROST 0.005 % OP SOLN
1.0000 [drp] | Freq: Every day | OPHTHALMIC | Status: DC
  Administered 2023-09-24 – 2023-09-28 (×5): 1 [drp] via OPHTHALMIC
  Filled 2023-09-24: qty 2.5

## 2023-09-24 MED ORDER — PREDNISOLONE ACETATE 1 % OP SUSP
1.0000 [drp] | Freq: Every day | OPHTHALMIC | Status: DC
  Administered 2023-09-24 – 2023-09-29 (×6): 1 [drp] via OPHTHALMIC
  Filled 2023-09-24: qty 1

## 2023-09-24 NOTE — Progress Notes (Signed)
 Peripherally Inserted Central Catheter Placement  The IV Nurse has discussed with the patient and/or persons authorized to consent for the patient, the purpose of this procedure and the potential benefits and risks involved with this procedure.  The benefits include less needle sticks, lab draws from the catheter, and the patient may be discharged home with the catheter. Risks include, but not limited to, infection, bleeding, blood clot (thrombus formation), and puncture of an artery; nerve damage and irregular heartbeat and possibility to perform a PICC exchange if needed/ordered by physician.  Alternatives to this procedure were also discussed.  Bard Power PICC patient education guide, fact sheet on infection prevention and patient information card has been provided to patient /or left at bedside.   Consent obtained with wife at bedside   PICC Placement Documentation  PICC Double Lumen 09/24/23 Right Brachial 40 cm 0 cm (Active)  Indication for Insertion or Continuance of Line Prolonged intravenous therapies 09/24/23 1600  Exposed Catheter (cm) 0 cm 09/24/23 1600  Site Assessment Clean, Dry, Intact 09/24/23 1600  Lumen #1 Status Flushed;Leaking;Blood return noted 09/24/23 1600  Lumen #2 Status Flushed;Saline locked;Blood return noted 09/24/23 1600  Dressing Type Transparent;Securing device 09/24/23 1600  Dressing Status Antimicrobial disc/dressing in place;Clean, Dry, Intact 09/24/23 1600  Line Care Line pulled back 09/24/23 1600  Line Adjustment (NICU/IV Team Only) No 09/24/23 1600  Dressing Intervention New dressing;Adhesive placed at insertion site (IV team only) 09/24/23 1600  Dressing Change Due 10/01/23 09/24/23 1600       Blake [name] [name] 09/24/2023, 4:05 PM

## 2023-09-24 NOTE — Progress Notes (Signed)
 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   Per ed reports, and patient's wife who is at the bedside, Patient was seen at the urgent care for chief complaints of chills, body aches, intermittently and nonproductive coughing fits. He was diagnosed with acute upper respiratory infection and was sent home with promethazine-dextromethorphan and cefdinir (OMNICEF). [name] report that he took the prescription and went to bed. Later he woke up altered and delusional and c/o CP and SOB. He was noted to be sweating profusely with abnormal body temp per wife.   ED Course: Initial vital signs showed HR >180 beats/minute, BP >160 mm Hg, the RR 30s-40s breaths/minute, and the oxygen saturation 80s% on NRB and a temperature of 103.62F (39.6C).    Pertinent Labs/Diagnostics Findings: Na+/ K+:138/3.9  Glucose:127 CO2 21 WBC: unremarkable Lactic acid: 1.5~2.6 CK: COVID PCR: Negative troponin:106~7453   BNP:573.5  VBG: pO2 pend; pCO2 37; pH 7.49;  HCO3 28.2, %O2 Sat 43.7.  CXR> CTH> CTA Chest> CT Abd/pelvis>see report   Patient's agitation and restlessness worsen with HR up in the 170s to 180s.  2 mg of Ativan  was trialed but patient did not  calm down. Given worsening symptoms and high risk for decompensation, patient intubated for airway protection. He was started on broad-spectrum antibiotics Ampicillin , vanc, Ceftriaxone  and Acyclovir  due to unclear source for possible infection and cover broadly to include treatment for meningitis. PCCM consulted for admission.  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 and fell off, then he got back onto the horse and fell again on asphalt), HFrEF, ischemic cardiomyopathy, Hyperlipidemia, SVT, CAD, Depression, GERD, Hearing loss of both ears, MI, CVA, Hypertension, Hypotestosteronemia, ICD in place [facility] [employer] D433 DEFIBRILLATOR, RESONATE EL DR DF4 S/N: [phone] implanted 11/21/2021), and OSA on CPAP     Significant Hospital Events: Including procedures, antibiotic start and stop dates in addition to other pertinent events   5/26: Admitted to ICU with acute altered mental status and respiratory failure in the setting of suspected NMS versus sepsis requiring intubation 5/27: fever improved, continued on vasopressors 5/28: extubated in the afternoon. Respiratory failure overnight, re-intubated 5/29: vented, increased oxygen requirements. Family at the bedside. Heart failure consult placed 5/30: vent requirements improved, diuresing, sedated. Reina Cara with SBT 5/31: remains vented, WUA this AM 6/01: vented, sedated, afebrile 6/2 remains on vent 6/3 remains on vent, TRACH PEG TUBE pending 6/4 s/p trach  Interim History / Subjective:  Remains critically ill Requires VENT support for survival S/p trach and PEG tube   Vent Mode: PRVC FiO2 (%):  [30 %-60 %] 30 % Set Rate:  [15 bmp] 15 bmp Vt Set:  [500 mL] 500 mL  PEEP:  [5 cmH20] 5 cmH20 Plateau Pressure:  [11 cmH20-15 cmH20] 11 cmH20   Objective    Blood pressure 137/81,  pulse 79, temperature 98.6 F (37 C), temperature source Axillary, resp. rate 19, height 5' 5.98" (1.676 m), weight 84.2 kg, SpO2 98%. CVP:  [2 mmHg-11 mmHg] 10 mmHg  Vent Mode: PRVC FiO2 (%):  [30 %-60 %] 30 % Set Rate:  [15 bmp] 15 bmp Vt Set:  [500 mL] 500 mL PEEP:  [5 cmH20] 5 cmH20 Plateau Pressure:  [11 cmH20-15 cmH20] 11 cmH20   Intake/Output Summary (Last 24 hours) at 09/24/2023 0745 Last data filed at 09/24/2023 0600 Gross per 24 hour  Intake 1132.6 ml  Output 1230 ml  Net -97.4 ml   Filed Weights   09/22/23 0550 09/23/23 0355 09/24/23 0446  Weight: 83.4 kg 84.3 kg 84.2 kg      REVIEW OF SYSTEMS  PATIENT IS UNABLE TO PROVIDE COMPLETE REVIEW OF SYSTEMS DUE TO SEVERE CRITICAL ILLNESS   PHYSICAL EXAMINATION:  GENERAL:critically ill appearing, +resp distress EYES: Pupils equal, round, reactive to light.  No scleral icterus.  MOUTH: Moist mucosal membrane. NECK: Supple. S/p trach PULMONARY: Lungs clear to auscultation, +rhonchi, +wheezing CARDIOVASCULAR: S1 and S2.  Regular rate and rhythm GASTROINTESTINAL: Soft, nontender, -distended. Positive bowel sounds.  S/p peg MUSCULOSKELETAL: No swelling, clubbing, or edema.  NEUROLOGIC: obtunded,sedated SKIN:normal, warm to touch, Capillary refill delayed  Pulses present bilaterally          Assessment and Plan   73 year old male presenting with a few days' history of shortness of breath, viral symptoms, and encephalopathy. He was intubated on presentation to the ED due to agitation for airway protection and admitted to the ICU for further management, Diagnosis of b/l Pneumonia with failure to wean from ventilator, failed one trial of extubation  Severe ACUTE Hypoxic and Hypercapnic Respiratory Failure -continue Mechanical Ventilator support -Wean Fio2 and PEEP as tolerated -VAP/VENT bundle implementation - Wean PEEP & FiO2 as tolerated, maintain SpO2 > 88% - Head of bed elevated 30 degrees, VAP protocol in  place - Plateau pressures less than 30 cm H20  - Intermittent chest x-ray & ABG PRN - Ensure adequate pulmonary hygiene  -will perform SAT/SBT when respiratory parameters are met [facility] 6/4  SEPTIC/Cardiogenic  shock-resolving SOURCE-Pneumonia/cardiac failure -use vasopressors to keep MAP>65 as needed -follow ABG and LA as needed  NEUROLOGY ACUTE METABOLIC ENCEPHALOPATHY Fronto-temporal Dementia Started Seroquel WUA when wife arrives   INFECTIOUS DISEASE RHINO-Viral pneumonia with superimposed bacterial pneumonia Aspiration Pneumonia  Community Acquired Pneumonia -continue antibiotics as prescribed -follow up cultures Procal is improved.  ACUTE SYSTOLIC CARDIAC FAILURE- EF 35% Ventricular Tachycardia- (5/30) while on SBT for which an amiodarone  gtt was started.  -Lasix  as tolerated -follow up cardiac enzymes as indicated -follow up cardiology recs Type II NSTEMI  CAD s/p PCI to LAD   RENAL -continue Foley Catheter-assess need -Avoid nephrotoxic agents -Follow urine output, BMP -Ensure adequate renal perfusion, optimize oxygenation -Renal dose medications   Intake/Output Summary (Last 24 hours) at 09/24/2023 0747 Last data filed at 09/24/2023 0600 Gross per 24 hour  Intake 1132.6 ml  Output 1230 ml  Net -97.4 ml     ENDO - ICU hypoglycemic\Hyperglycemia protocol -check FSBS per protocol   GI GI PROPHYLAXIS as indicated NUTRITIONAL STATUS DIET-->TF's as tolerated Constipation protocol as indicated   ELECTROLYTES -follow labs as needed -replace as needed -pharmacy consultation and following  RESTRICTIVE TRANSFUSION PROTOCOL TRANSFUSION  IF HGB<7  or ACTIVE  BLEEDING OR DX of ACUTE CORONARY SYNDROMES         Best Practice (right click and "Reselect all SmartList Selections" daily)   Diet/type: tubefeeds DVT prophylaxis LMWH Pressure ulcer(s): N/A GI prophylaxis: PPI Lines: Central line and Arterial Line Foley:  Yes, and it is still  needed Code Status:  full code Last date of multidisciplinary goals of care discussion [09/20/2023]  Labs   CBC: Recent Labs  Lab 09/18/23 0430 09/19/23 0409 09/21/23 0926 09/22/23 0418 09/23/23 0403 09/24/23 0416  WBC 9.3 6.9 6.4 7.9 8.9 11.1*  NEUTROABS 7.6 5.6  --   --   --   --   HGB 12.5* 12.3* 11.8* 12.3* 13.2 12.1*  HCT 36.9* 38.1* 36.1* 38.6* 41.4 36.7*  MCV 95.1 97.4 97.3 97.5 97.0 96.3  PLT 173 179 235 278 326 306    Basic Metabolic Panel: Recent Labs  Lab 09/18/23 1348 09/19/23 0409 09/20/23 0437 09/21/23 0458 09/22/23 0418 09/23/23 0403 09/24/23 0416  NA 144 143 149* 146* 147* 142 142  K 3.9 3.8 3.3* 3.3* 3.5 4.3 4.2  CL 105 104 103 98 101 103 106  CO2 29 29 35* 38* 36* 32 31  GLUCOSE 160* 157* 164* 159* 104* 108* 143*  BUN 43* 46* 42* 42* 39* 34* 39*  CREATININE 1.03 1.02 0.89 0.88 0.80 0.87 0.90  CALCIUM  7.2* 7.5* 7.6* 7.8* 7.9* 8.3* 8.2*  MG 2.3  --  2.7* 2.7*  --  2.3 2.6*  PHOS  --  2.5 2.2* 3.4  --  3.5 2.5   GFR: Estimated Creatinine Clearance: 72.2 mL/min (by C-G formula based on SCr of 0.9 mg/dL). Recent Labs  Lab 09/18/23 0429 09/18/23 0430 09/19/23 0409 09/20/23 0437 09/21/23 0926 09/22/23 0418 09/23/23 0403 09/24/23 0416  PROCALCITON 2.99  --  1.78 1.20  --   --   --   --   WBC  --    < > 6.9  --  6.4 7.9 8.9 11.1*   < > = values in this interval not displayed.    Liver Function Tests: No results for input(s): "AST", "ALT", "ALKPHOS", "BILITOT", "PROT", "ALBUMIN" in the last 168 hours.  No results for input(s): "LIPASE", "AMYLASE" in the last 168 hours. No results for input(s): "AMMONIA" in the last 168 hours.  ABG    Component Value Date/Time   PHART 7.44 09/19/2023 0943   PCO2ART 51 (H) 09/19/2023 0943   PO2ART 84 09/19/2023 0943   HCO3 34.6 (H) 09/19/2023 0943   ACIDBASEDEF 0.2 09/17/2023 0834   O2SAT 74.7 09/24/2023 0500     Coagulation Profile: Recent Labs  Lab 09/21/23 1437  INR 1.1    Cardiac Enzymes: No  results for input(s): "CKTOTAL", "CKMB", "CKMBINDEX", "TROPONINI" in the last 168 hours.   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), Cognitive deficit as late effect of traumatic brain injury (HCC) (03/06/2016), Coronary artery disease, Dementia (HCC), Depression, Disorder of male genital organs (07/11/2013), Dyspnea, Encounter for long-term (current) use of medications (11/21/2014), Erectile dysfunction (07/11/2013), Executive function deficit (03/06/2016), GERD (gastroesophageal reflux  disease), Headache, Hearing loss, Hyperlipidemia, Hypertension, Hypogonadism, male (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   Surgical History:   Past Surgical History:  Procedure Laterality Date   BACK SURGERY  2011   rods and screws   CLOSED REDUCTION NASAL FRACTURE Bilateral 06/11/2018   Procedure: CLOSED REDUCTION NASAL FRACTURE;  Surgeon: [name], MD;  Location: [facility] ORS;  Service: ENT;  Laterality: Bilateral;    COLONOSCOPY  2015   CORONARY ANGIOPLASTY     2015 stent   CORONARY STENT INTERVENTION N/A 06/08/2019   Procedure: CORONARY STENT INTERVENTION;  Surgeon: Antonette Batters, MD;  Location: [facility] INVAS[facility] CV [facility];  Service: Cardiovascular;  Laterality: N/A;   CYSTOSCOPY  1984   EYE SURGERY Bilateral 2005,2006,2009   detached retina, cataract   FOOT ARTHRODESIS Left 05/30/2015   Procedure: ARTHRODESIS FOOT STJ LT FOOT;  Surgeon: Anell Baptist, DPM;  Location: [facility];  Service: Podiatry;  Laterality: Left;  WITH POPLITEAL BLOCK   FOOT SURGERY Left    HERNIA REPAIR Right 1969   inguinal   IR GASTROSTOMY TUBE MOD SED  09/22/2023   LEFT HEART CATH AND CORONARY ANGIOGRAPHY Left 06/08/2019   Procedure: LEFT HEART CATH AND CORONARY ANGIOGRAPHY;  Surgeon: Antonette Batters, MD;  Location: [facility] INV[facility] CV [facility];  Service: Cardiovascular;  Laterality: Left;   SEPTOPLASTY Bilateral 06/11/2018   Procedure: SEPTOPLASTY;  Surgeon: [name], MD;  Location: [facility] OR[facility];  Service: ENT;  Laterality: Bilateral;   SHOULDER SURGERY Right 2004   skull surgery     TOE SURGERY Right 2009   TONSILLECTOMY  2008     Social History:   reports that he has never smoked. He has never used smokeless tobacco. He reports that he does not drink alcohol and does not use drugs.   Family History:  His family history includes Asperger's syndrome in his mother.   Allergies Allergies  Allergen Reactions   Promethazine Other (See Comments)    Severe neuroleptic malignant syndrome requiring intubation   Mirtazapine Other (See Comments)    Other reaction(s): Other (See Comments) Irritability/anger, increased appetite, worsening depression Irritability/anger, increased appetite, worsening depression      Home Medications  Prior to Admission medications   Medication Sig Start Date End Date Taking? Authorizing Provider  acetaminophen  (TYLENOL ) 500 MG tablet Take 1,000 mg by mouth every 6 (six) hours as needed for  mild pain or moderate pain.   Yes [provider]  aspirin  EC 81 MG tablet Take 81 mg by mouth daily.   Yes [provider]  B Complex-C (SUPER B COMPLEX PO) Take 1 tablet by mouth daily.   Yes [provider]  Calcium  Carbonate-Vitamin D  600-200 MG-UNIT CAPS Take 1 tablet by mouth 2 (two) times daily.    Yes [provider]  carvedilol  (COREG ) 3.125 MG tablet Take 3.125 mg by mouth daily.  11/03/18  Yes [provider]  cefdinir (OMNICEF) 300 MG capsule Take 300 mg by mouth 2 (two) times daily. 09/14/23 09/21/23 Yes [provider]  clindamycin  (CLEOCIN  T) 1 % lotion Apply 1 application topically 2 (two) times daily as needed.    Yes [provider]  clopidogrel  (PLAVIX )  75 MG tablet Take 75 mg by mouth daily. 09/27/18  Yes [provider]  divalproex  (DEPAKOTE  ER) 250 MG 24 hr tablet Total of 750 mg daily. Take along with 500 mg tab 08/24/23  Yes Hisada, Ivan Marion, MD  divalproex  (DEPAKOTE  ER) 500 MG 24 hr tablet Take 1 tablet (500 mg total) by mouth daily. 08/13/23 10/12/23 Yes Todd Fossa, MD  DULoxetine  (CYMBALTA ) 60 MG capsule Take 60 mg by mouth 2 (two) times daily.  11/17/18  Yes [provider]  FIBER PO Take 1 tablet by mouth 2 (two) times daily.    Yes [provider]  furosemide  (LASIX ) 20 MG tablet Take 20 mg by mouth daily. 11/03/18  Yes [provider]  latanoprost (XALATAN) 0.005 % ophthalmic solution Place 1 drop into the left eye daily. 11/06/21  Yes [provider]  losartan (COZAAR) 100 MG tablet Take 100 mg by mouth daily.   Yes [provider]  Melatonin 5 MG TABS Take 10 mg by mouth at bedtime. 30 min before bed   Yes [provider]  omeprazole (PRILOSEC) 20 MG capsule Take 20 mg by mouth daily.   Yes [provider]  Probiotic Product (PROBIOTIC DAILY PO) Take 1 tablet by mouth daily. Senior   Yes [provider]  silodosin  (RAPAFLO ) 8 MG CAPS  capsule TAKE 1 CAPSULE BY MOUTH DAILY WITH BREAKFAST Patient taking differently: Take 8 mg by mouth daily. 03/16/23  Yes Stoioff, Kizzie Perks, MD  sodium chloride  (OCEAN) 0.65 % SOLN nasal spray Place 2 sprays into both nostrils as needed for congestion.   Yes [provider]  tadalafil  (CIALIS ) 5 MG tablet Take 1 tablet by mouth once daily 08/03/23  Yes Stoioff, Scott C, MD  SYRINGE-NEEDLE, DISP, 3 ML (B-D 3CC LUER-LOK SYR 22GX1-1/2) 22G X 1-1/2" 3 ML MISC USE AS DIRECTED WITH TESTOSTERONE  08/24/23   Stoioff, Kizzie Perks, MD  testosterone  cypionate (DEPOTESTOSTERONE CYPIONATE) 200 MG/ML injection Inject 0.3 mLs (60 mg total) into the muscle once a week. DISCARD REMAINING AS THESE ARE SINGLE USE VIALS. Patient taking differently: Inject 60 mg into the muscle every 14 (fourteen) days. EVERY OTHER SATURDAY. DISCARD REMAINING AS THESE ARE SINGLE USE VIALS. 06/16/23   Geraline Knapp, MD       DVT/GI PRX  assessed I Assessed the need for Labs I Assessed the need for Foley I Assessed the need for Central Venous Line Family Discussion when available I Assessed the need for Mobilization I made an Assessment of medications to be adjusted accordingly Safety Risk assessment completed  CASE DISCUSSED IN MULTIDISCIPLINARY ROUNDS WITH ICU TEAM     Critical Care Time devoted to patient care services described in this note is 55 minutes.  Critical care was necessary to treat /prevent imminent and life-threatening deterioration. Overall, patient is critically ill, prognosis is guarded.  Patient with Multiorgan failure and at high risk for cardiac arrest and death.    Lady Pier, M.D.  [facility]  Medical Director [facility] Medical Director [facility] Cardio-Pulmonary Department

## 2023-09-24 NOTE — Plan of Care (Signed)
  Problem: Clinical Measurements: Goal: Ability to maintain clinical measurements within normal limits will improve Outcome: Progressing Goal: Diagnostic test results will improve Outcome: Progressing Goal: Cardiovascular complication will be avoided Outcome: Progressing   Problem: Nutrition: Goal: Adequate nutrition will be maintained Outcome: Progressing   Problem: Elimination: Goal: Will not experience complications related to bowel motility Outcome: Progressing   Problem: Pain Managment: Goal: General experience of comfort will improve and/or be controlled Outcome: Progressing   Problem: Safety: Goal: Ability to remain free from injury will improve Outcome: Progressing   Problem: Skin Integrity: Goal: Risk for impaired skin integrity will decrease Outcome: Progressing

## 2023-09-24 NOTE — Progress Notes (Signed)
 [facility] CLINIC CARDIOLOGY PROGRESS NOTE       Patient ID: Blake [name] MRN: [id number] DOB/AGE: [birth year] 73 y.o.  Admit date: 09/14/2023 Referring Physician Dr. Vergia Glasgow Primary Physician Rory Collard, MD Primary Cardiologist Dr. Beau Bound Reason for Consultation Elevated trops, NSTEMI  HPI: Blake [name] is a 73 y.o. male  with a past medical history of coronary artery disease s/p stent to LAD, ischemic cardiomyopathy s/p ICD ([employer]), chronic HFrEF, OSA (on CPAP), hx CVA who presented to the ED on 09/14/2023 for dyspnea, chest pain and AMS. Patient recently seen at urgent care for nonproductive cough, chills/diaphoresis Troponins found to be elevated.  Cardiology was consulted for further evaluation.   Interval History: -Patient seen and examined this AM. Patient sedated with trach in place. BP and HR have remained stable.  -Overnight Tele showed no significant events.  -Patient appears euvolemic.  -Plan discussed with wife at bedside.  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   Coronary artery disease    Dementia (HCC)    Depression    Disorder of male genital organs 07/11/2013   Dyspnea    Encounter for long-term (current) use of medications 11/21/2014   Erectile dysfunction 07/11/2013   Executive function deficit 03/06/2016   GERD (gastroesophageal reflux disease)    Headache    stress   Hearing loss    Hyperlipidemia    Hypertension    Hypogonadism, 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   Major depression, recurrent, chronic (HCC) 03/06/2016   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   Mixed hyperlipidemia 02/02/2014   Myocardial infarction (HCC) 08/2013   OCD (obsessive compulsive disorder)    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   Other specified disorder of male genital organs(608.89) 07/11/2013   Prostatic hypertrophy    Pseudophakia of both eyes 04/20/2015   Raynaud's disease    Reduced libido 07/11/2013   Repeated falls    weak left ankle   Scoliosis    Stroke (HCC) 2/16   "light"   Synovitis    knees, ankles    Past Surgical History:  Procedure Laterality Date   BACK SURGERY  2011   rods and screws   CLOSED REDUCTION NASAL FRACTURE Bilateral 06/11/2018   Procedure: CLOSED REDUCTION NASAL FRACTURE;  Surgeon: [name], MD;  Location: [facility] ORS;  Service: ENT;  Laterality: Bilateral;   COLONOSCOPY  2015   CORONARY ANGIOPLASTY     2015 stent   CORONARY STENT INTERVENTION N/A 06/08/2019   Procedure: CORONARY STENT INTERVENTION;  Surgeon: Antonette Batters, MD;  Location: [facility] INVASIVE CV LAB;  Service: Cardiovascular;  Laterality: N/A;   CYSTOSCOPY  1984   EYE SURGERY Bilateral 2005,2006,2009   detached retina, cataract   FOOT ARTHRODESIS Left 05/30/2015   Procedure: ARTHRODESIS FOOT STJ LT FOOT;  Surgeon: Anell Baptist, DPM;  Location: [facility];  Service: Podiatry;  Laterality: Left;  WITH POPLITEAL BLOCK  FOOT SURGERY Left    HERNIA REPAIR Right 1969   inguinal   IR GASTROSTOMY TUBE MOD SED  09/22/2023   LEFT HEART CATH AND CORONARY ANGIOGRAPHY Left 06/08/2019   Procedure: LEFT HEART CATH AND CORONARY ANGIOGRAPHY;  Surgeon: Antonette Batters, MD;  Location: [facility] INVASIVE CV LAB;  Service: Cardiovascular;  Laterality: Left;   SEPTOPLASTY Bilateral 06/11/2018   Procedure: SEPTOPLASTY;  Surgeon: [name], MD;  Location: [facility] ORS;  Service: ENT;  Laterality: Bilateral;    SHOULDER SURGERY Right 2004   skull surgery     TOE SURGERY Right 2009   TONSILLECTOMY  2008    Medications Prior to Admission  Medication Sig Dispense Refill Last Dose/Taking   acetaminophen  (TYLENOL ) 500 MG tablet Take 1,000 mg by mouth every 6 (six) hours as needed for mild pain or moderate pain.   Unknown   aspirin  EC 81 MG tablet Take 81 mg by mouth daily.   09/14/2023 at  6:00 AM   B Complex-C (SUPER B COMPLEX PO) Take 1 tablet by mouth daily.   09/14/2023 Morning   Calcium  Carbonate-Vitamin D  600-200 MG-UNIT CAPS Take 1 tablet by mouth 2 (two) times daily.    09/14/2023 Morning   carvedilol  (COREG ) 3.125 MG tablet Take 3.125 mg by mouth daily.    09/14/2023 Morning   [EXPIRED] cefdinir (OMNICEF) 300 MG capsule Take 300 mg by mouth 2 (two) times daily.   09/14/2023 Noon   clindamycin  (CLEOCIN  T) 1 % lotion Apply 1 application topically 2 (two) times daily as needed.    Unknown   clopidogrel  (PLAVIX ) 75 MG tablet Take 75 mg by mouth daily.   09/14/2023 at  6:00 AM   divalproex  (DEPAKOTE  ER) 250 MG 24 hr tablet Total of 750 mg daily. Take along with 500 mg tab 30 tablet 0 09/14/2023 at  8:00 AM   divalproex  (DEPAKOTE  ER) 500 MG 24 hr tablet Take 1 tablet (500 mg total) by mouth daily. 30 tablet 1 09/14/2023 at  8:00 AM   DULoxetine  (CYMBALTA ) 60 MG capsule Take 60 mg by mouth 2 (two) times daily.    09/14/2023 at  3:30 AM   FIBER PO Take 1 tablet by mouth 2 (two) times daily.    09/14/2023 Morning   furosemide  (LASIX ) 20 MG tablet Take 20 mg by mouth daily.   09/14/2023 Morning   latanoprost (XALATAN) 0.005 % ophthalmic solution Place 1 drop into the left eye daily.   09/14/2023 Morning   losartan (COZAAR) 100 MG tablet Take 100 mg by mouth daily.   09/14/2023 Morning   Melatonin 5 MG TABS Take 10 mg by mouth at bedtime. 30 min before bed   09/13/2023 Bedtime   omeprazole (PRILOSEC) 20 MG capsule Take 20 mg by mouth daily.   09/14/2023 Morning   Probiotic Product (PROBIOTIC DAILY PO) Take 1 tablet by  mouth daily. Senior   09/14/2023 Morning   silodosin  (RAPAFLO ) 8 MG CAPS capsule TAKE 1 CAPSULE BY MOUTH DAILY WITH BREAKFAST (Patient taking differently: Take 8 mg by mouth daily.) 90 capsule 3 09/13/2023 at  6:00 PM   sodium chloride  (OCEAN) 0.65 % SOLN nasal spray Place 2 sprays into both nostrils as needed for congestion.   09/14/2023 Morning   tadalafil  (CIALIS ) 5 MG tablet Take 1 tablet by mouth once daily 90 tablet 0 09/14/2023 Morning   SYRINGE-NEEDLE, DISP, 3 ML (B-D 3CC LUER-LOK SYR 22GX1-1/2) 22G X 1-1/2" 3 ML MISC USE AS DIRECTED WITH TESTOSTERONE  12 each 20  testosterone  cypionate (DEPOTESTOSTERONE CYPIONATE) 200 MG/ML injection Inject 0.3 mLs (60 mg total) into the muscle once a week. DISCARD REMAINING AS THESE ARE SINGLE USE VIALS. (Patient taking differently: Inject 60 mg into the muscle every 14 (fourteen) days. EVERY OTHER SATURDAY. DISCARD REMAINING AS THESE ARE SINGLE USE VIALS.) 5 mL 0    Social History   Socioeconomic History   Marital status: Married    Spouse name: cindy   Number of children: 1   Years of education: Not on file   Highest education level: Professional school degree (e.g., MD, DDS, DVM, JD)  Occupational History   Not on file  Tobacco Use   Smoking status: Never   Smokeless tobacco: Never  Vaping Use   Vaping status: Never Used  Substance and Sexual Activity   Alcohol use: No   Drug use: No   Sexual activity: Not Currently    Birth control/protection: None  Other Topics Concern   Not on file  Social History Narrative   Not on file   Social Drivers of Health   Financial Resource Strain: Not on file  Food Insecurity: No Food Insecurity (09/15/2023)   Hunger Vital Sign    Worried About Running Out of Food in the Last Year: Never true    Ran Out of Food in the Last Year: Never true  Transportation Needs: No Transportation Needs (09/15/2023)   PRAPARE - Administrator, Civil Service (Medical): No    Lack of Transportation  (Non-Medical): No  Physical Activity: Not on file  Stress: Not on file  Social Connections: Patient Unable To Answer (09/15/2023)   Social Connection and Isolation Panel [NHANES]    Frequency of Communication with Friends and Family: Patient unable to answer    Frequency of Social Gatherings with Friends and Family: Patient unable to answer    Attends Religious Services: Patient unable to answer    Active Member of Clubs or Organizations: Patient unable to answer    Attends Banker Meetings: Patient unable to answer    Marital Status: Patient unable to answer  Intimate Partner Violence: Unknown (09/15/2023)   Humiliation, Afraid, Rape, and Kick questionnaire    Fear of Current or Ex-Partner: Patient unable to answer    Emotionally Abused: Patient unable to answer    Physically Abused: Not on file    Sexually Abused: Patient unable to answer    Family History  Problem Relation Age of Onset   Asperger's syndrome Mother      Vitals:   09/24/23 1000 09/24/23 1058 09/24/23 1100 09/24/23 1200  BP: (!) 140/87 136/83 (!) 147/91 105/67  Pulse: 97 98 97 81  Resp: 19  (!) 22 13  Temp:      TempSrc:      SpO2: 93%  97% 93%  Weight:      Height:        PHYSICAL EXAM General: Chronically ill-appearing elderly male, well nourished, in no acute distress. HEENT: Normocephalic and atraumatic. Neck: No JVD.   Lungs: Trach in place.  Mechanical breath sounds bilaterally. Heart: HRRR. Normal S1 and S2 without gallops or murmurs.  Abdomen: Non-distended appearing.  Msk: Normal strength and tone for age. Extremities: Warm and well perfused. No clubbing, cyanosis. No edema.  Neuro: Alert and oriented X 3. Psych: Answers questions appropriately.   Labs: Basic Metabolic Panel: Recent Labs    09/23/23 0403 09/24/23 0416  NA 142 142  K 4.3 4.2  CL 103 106  CO2  32 31  GLUCOSE 108* 143*  BUN 34* 39*  CREATININE 0.87 0.90  CALCIUM  8.3* 8.2*  MG 2.3 2.6*  PHOS 3.5 2.5    Liver Function Tests: No results for input(s): "AST", "ALT", "ALKPHOS", "BILITOT", "PROT", "ALBUMIN" in the last 72 hours.  No results for input(s): "LIPASE", "AMYLASE" in the last 72 hours. CBC: Recent Labs    09/23/23 0403 09/24/23 0416  WBC 8.9 11.1*  HGB 13.2 12.1*  HCT 41.4 36.7*  MCV 97.0 96.3  PLT 326 306   Cardiac Enzymes: No results for input(s): "CKTOTAL", "CKMB", "CKMBINDEX", "TROPONINIHS" in the last 72 hours.  BNP: No results for input(s): "BNP" in the last 72 hours.  D-Dimer: No results for input(s): "DDIMER" in the last 72 hours. Hemoglobin A1C: No results for input(s): "HGBA1C" in the last 72 hours. Fasting Lipid Panel: Recent Labs    09/24/23 0416  TRIG 150*   Thyroid Function Tests: No results for input(s): "TSH", "T4TOTAL", "T3FREE", "THYROIDAB" in the last 72 hours.  Invalid input(s): "FREET3"  Anemia Panel: No results for input(s): "VITAMINB12", "FOLATE", "FERRITIN", "TIBC", "IRON", "RETICCTPCT" in the last 72 hours.   Radiology: US  EKG SITE RITE Result Date: 09/24/2023 If Site Rite image not attached, placement could not be confirmed due to current cardiac rhythm.  IR GASTROSTOMY TUBE MOD SED Result Date: 09/22/2023 INDICATION: 73 year old with respiratory failure and on the ventilator. Request for placement of a gastrostomy tube. EXAM: PERCUTANEOUS GASTROSTOMY TUBE PLACEMENT WITH FLUOROSCOPIC GUIDANCE Physician: Olive Better. Henn, MD MEDICATIONS: Ancef  2 g; Antibiotics were administered within 1 hour of the procedure. Glucagon 1 mg ANESTHESIA/SEDATION: Patient was intubated and on propofol . Patient was monitored by the radiology nurse throughout the procedure. FLUOROSCOPY: Radiation Exposure Index (as provided by the fluoroscopic device): 43 mGy Kerma COMPLICATIONS: None immediate. PROCEDURE: Informed consent was obtained for a percutaneous gastrostomy tube. The patient was placed on the interventional table. Nasogastric tube was already in place. The  anterior abdomen was prepped and draped in sterile fashion. Maximal barrier sterile technique was utilized including caps, mask, sterile gowns, sterile gloves, sterile drape, hand hygiene and skin antiseptic. Stomach was insufflated with air through the nasogastric tube. Anterior abdomen was anesthetized using 1% lidocaine . Using fluoroscopic guidance, 2 Saf-T-Pexy T fasteners were deployed within the stomach. Incision was made between the T-fasteners. Needle was directed into the stomach between the T-fasteners using fluoroscopic guidance and a wire was advanced into the stomach. The tract was dilated to accommodate a 20 French peel-away sheath. An 56 French Entuit gastrostomy tube was advanced over the wire and through the peel-away sheath. Peel-away sheath was removed. The balloon was inflated with 10 mL of sterile water . Gastrostomy tube was injected with contrast to confirm placement in the stomach. Fluoroscopic images were taken and saved for this procedure. FINDINGS: Contrast injection confirms gastrostomy tube in the stomach. IMPRESSION: 1. Successful placement of a percutaneous gastrostomy tube using fluoroscopic guidance. 2. Plan for removal of the T-fasteners in 10-14 days. Electronically Signed   By: Elene Griffes M.D.   On: 09/22/2023 14:13   DG Chest Port 1 View Result Date: 09/20/2023 CLINICAL DATA:  73 year old male respiratory failure, intubated. EXAM: PORTABLE CHEST 1 VIEW COMPARISON:  Portable chest yesterday and earlier. FINDINGS: Portable AP upright view at 0942 hours. Stable lines and tubes. Left chest pacer/resuscitation pads persist. Stable lung volumes and mediastinal contours. Stable left chest AICD. Stable ventilation since yesterday. No pneumothorax, pulmonary edema. Patchy left greater than right lung base opacity. Paucity of bowel  gas in the upper abdomen. Stable visualized osseous structures. IMPRESSION: 1. Stable lines and tubes. 2. Stable ventilation since yesterday. Patchy left  greater than right lung base opacity. Electronically Signed   By: Marlise Simpers M.D.   On: 09/20/2023 10:26   DG Chest Port 1 View Result Date: 09/19/2023 CLINICAL DATA:  73 year old male with respiratory failure. EXAM: PORTABLE CHEST 1 VIEW COMPARISON:  Portable AP semi upright view at 0751 hours. FINDINGS: Portable AP semi upright view at 0751 hours. Less rotated to the left now. Pacer and resuscitation pads project over the lower chest. Stable left chest AICD. Endotracheal tube tip remains satisfactory. Stable lines and tubes. Stable low lung volumes, patchy perihilar and lung base opacity. Ventilation is stable with no pneumothorax, pleural effusion identified. And ventilation has improved compared to 09/17/2023. Stable cardiac size and mediastinal contours. Paucity of bowel gas now.  Stable visualized osseous structures. IMPRESSION: 1. Stable lines and tubes. 2. Stable ventilation since yesterday, improved since 09/17/2023. Stable low lung volumes and patchy perihilar and lung base opacity. Electronically Signed   By: Marlise Simpers M.D.   On: 09/19/2023 08:02   DG Abd 1 View Result Date: 09/18/2023 CLINICAL DATA:  Check gastric catheter placement EXAM: ABDOMEN - 1 VIEW COMPARISON:  09/17/2023 FINDINGS: Gastric catheter is noted coiled within the stomach. No free air is seen. Postsurgical changes in the lower lumbar spine are noted. No obstructive pattern is seen. IMPRESSION: Gastric catheter in the stomach. Electronically Signed   By: Violeta Grey M.D.   On: 09/18/2023 11:18   DG Chest Port 1 View Result Date: 09/18/2023 CLINICAL DATA:  Shortness of breath and chest pain EXAM: PORTABLE CHEST 1 VIEW COMPARISON:  09/17/2023 FINDINGS: Cardiac shadow is stable. Defibrillator is again noted and stable. Endotracheal 2 and gastric catheter are seen. Lungs are well aerated bilaterally. Improved aeration is noted with decrease in edematous changes. Some basilar opacities are again seen bilaterally. IMPRESSION: Improved  aeration when compared with the prior exam. Some persistent basilar opacities remain. Electronically Signed   By: Violeta Grey M.D.   On: 09/18/2023 11:09   DG Abd 1 View Result Date: 09/17/2023 CLINICAL DATA:  OG tube placement. EXAM: ABDOMEN - 1 VIEW COMPARISON:  09/14/2023 FINDINGS: OG tube tip is positioned in the mid stomach with proximal side port below the expected location of the GE junction. Nonspecific bowel gas pattern within the visualized abdomen. IMPRESSION: OG tube tip is positioned in the mid stomach. Electronically Signed   By: Donnal Fusi M.D.   On: 09/17/2023 06:42   DG Chest Port 1 View Result Date: 09/17/2023 CLINICAL DATA:  73 year old male intubated, central line placement, bilateral airspace opacity. EXAM: PORTABLE CHEST 1 VIEW COMPARISON:  Portable chest 09/15/2023 and earlier. FINDINGS: Portable AP supine view at 0610 hours. Endotracheal tube projects over the midline trachea in good position between the clavicles and carina. Enteric tube has been removed. Stable right IJ approach central line. Stable left chest AICD. Similar lung volumes but diffuse bilateral increased pulmonary opacity with combined reticulonodular and vague/airspace appearance. No pneumothorax or pleural effusion. No air bronchograms. Stable cardiac size and mediastinal contours. No acute osseous abnormality identified. Paucity of bowel gas in the visible abdomen. IMPRESSION: 1. Satisfactory ET tube. Enteric tube removed. Stable right IJ approach central line. 2. Increased bilateral pulmonary opacity from two days ago, differential considerations include progressive pulmonary edema, bilateral infection, ARDS. No pleural effusion is evident. Electronically Signed   By: Arline Laity.D.  On: 09/17/2023 06:36   ECHOCARDIOGRAM COMPLETE Result Date: 09/16/2023    ECHOCARDIOGRAM REPORT   Patient Name:   DR. Hannah Lewis [name] Date of Exam: 09/15/2023 Medical Rec #:  [id number]             Height:       66.0 in Accession  #:    [id number]            Weight:       200.6 lb Date of Birth:  1951-01-26             BSA:          2.002 m Patient Age:    73 years              BP:           106/68 mmHg Patient Gender: M                     HR:           77 bpm. Exam Location:  [facility] Procedure: 2D Echo, Cardiac Doppler and Color Doppler (Both Spectral and Color            Flow Doppler were utilized during procedure). Indications:     Elevated Troponin  History:         Patient has no prior history of Echocardiogram examinations.                  CAD, Stroke, Signs/Symptoms:Dyspnea; Risk Factors:Hypertension                  and Dyslipidemia.  Sonographer:     Brigid Canada RDCS Referring Phys:  [id number] Delanna Fears Diagnosing Phys: Sabina Custovic IMPRESSIONS  1. Left ventricular ejection fraction, by estimation, is 35 to 40%. The left ventricle has moderately decreased function. The left ventricle demonstrates regional wall motion abnormalities (see scoring diagram/findings for description). Left ventricular  diastolic parameters are consistent with Grade I diastolic dysfunction (impaired relaxation).  2. Right ventricular systolic function is normal. The right ventricular size is normal. There is mildly elevated pulmonary artery systolic pressure. The estimated right ventricular systolic pressure is 42.7 mmHg.  3. The mitral valve is normal in structure. Moderate mitral valve regurgitation. No evidence of mitral stenosis.  4. The aortic valve is normal in structure. Aortic valve regurgitation is not visualized. No aortic stenosis is present.  5. The inferior vena cava is normal in size with greater than 50% respiratory variability, suggesting right atrial pressure of 3 mmHg. FINDINGS  Left Ventricle: Left ventricular ejection fraction, by estimation, is 35 to 40%. The left ventricle has moderately decreased function. The left ventricle demonstrates regional wall motion abnormalities. The left ventricular internal cavity size was  normal in size. There is no left ventricular hypertrophy. Left ventricular diastolic parameters are consistent with Grade I diastolic dysfunction (impaired relaxation).  LV Wall Scoring: The antero-lateral wall and apical lateral segment are hypokinetic. Right Ventricle: The right ventricular size is normal. No increase in right ventricular wall thickness. Right ventricular systolic function is normal. There is mildly elevated pulmonary artery systolic pressure. The tricuspid regurgitant velocity is 2.63  m/s, and with an assumed right atrial pressure of 15 mmHg, the estimated right ventricular systolic pressure is 42.7 mmHg. Left Atrium: Left atrial size was normal in size. Right Atrium: Right atrial size was normal in size. Pericardium: There is no evidence of pericardial effusion. Mitral Valve: The mitral valve is normal  in structure. Moderate mitral valve regurgitation. No evidence of mitral valve stenosis. Tricuspid Valve: The tricuspid valve is normal in structure. Tricuspid valve regurgitation is mild. The aortic valve is normal in structure. Aortic valve regurgitation is not visualized. No aortic stenosis is present. Pulmonic Valve: The pulmonic valve was normal in structure. Pulmonic valve regurgitation is not visualized. Aorta: The aortic root is normal in size and structure. Venous: The inferior vena cava is normal in size with greater than 50% respiratory variability, suggesting right atrial pressure of 3 mmHg. IAS/Shunts: No atrial level shunt detected by color flow Doppler.  LEFT VENTRICLE PLAX 2D LVIDd:         5.50 cm   Diastology LVIDs:         4.80 cm   LV e' medial:    6.71 cm/s LV PW:         1.00 cm   LV E/e' medial:  12.9 LV IVS:        1.00 cm   LV e' lateral:   12.83 cm/s LVOT diam:     2.30 cm   LV E/e' lateral: 6.8 LV SV:         63 LV SV Index:   32 LVOT Area:     4.15 cm  RIGHT VENTRICLE             IVC RV Basal diam:  3.70 cm     IVC diam: 2.30 cm RV S prime:     14.50 cm/s TAPSE  (M-mode): 2.5 cm LEFT ATRIUM             Index        RIGHT ATRIUM           Index LA diam:        4.70 cm 2.35 cm/m   RA Area:     12.60 cm LA Vol (A2C):   61.3 ml 30.61 ml/m  RA Volume:   31.10 ml  15.53 ml/m LA Vol (A4C):   36.9 ml 18.43 ml/m LA Biplane Vol: 48.5 ml 24.22 ml/m  AORTIC VALVE LVOT Vmax:   82.47 cm/s LVOT Vmean:  54.333 cm/s LVOT VTI:    0.153 m AI PHT:      376 msec  AORTA Ao Root diam: 3.20 cm Ao Asc diam:  3.70 cm MITRAL VALVE               TRICUSPID VALVE MV Area (PHT): 5.14 cm    TR Peak grad:   27.7 mmHg MV Decel Time: 148 msec    TR Vmax:        263.00 cm/s MV E velocity: 86.75 cm/s MV A velocity: 55.30 cm/s  SHUNTS MV E/A ratio:  1.57        Systemic VTI:  0.15 m                            Systemic Diam: 2.30 cm Lanell Pinta Custovic Electronically signed by Isabell Manzanilla Signature Date/Time: 09/16/2023/11:03:59 AM    Final    DG Chest Port 1 View Result Date: 09/15/2023 EXAM: 1 VIEW XRAY OF THE CHEST 09/15/2023 10:35:00 AM COMPARISON: 1 view chest x-ray 05/16/2023 at 2:38 PM. CLINICAL HISTORY: Endotracheally intubated. FINDINGS: LUNGS AND PLEURA: Mild edema and bilateral effusions are similar to the prior study. Bibasilar airspace opacity likely reflects atelectasis. HEART AND MEDIASTINUM: The heart is enlarged. BONES AND SOFT TISSUES: No acute osseous abnormality. LINES AND TUBES: The  endotracheal tube is stable, 3.5 cm above the carina. A right IJ line is stable. IMPRESSION: 1. Stable endotracheal tube and right IJ line. 2. Enlarged heart. 3. Mild edema and bilateral effusions, similar to the prior study. 4. Bibasilar airspace opacity, likely atelectasis. Electronically signed by: Audree Leas MD 09/15/2023 01:26 PM EDT RP Workstation: [id number]   DG Chest Port 1 View Result Date: 09/15/2023 CLINICAL DATA:  Central line placement EXAM: PORTABLE CHEST 1 VIEW COMPARISON:  Radiograph and CT 09/14/2023 FINDINGS: Stable cardiomegaly. Pulmonary vascular congestion and bilateral  ground-glass opacities. Small right pleural effusion and right basilar airspace opacities. No pneumothorax. Endotracheal tube tip in the intrathoracic trachea 3.0 cm from the carina. Subdiaphragmatic enteric tube. Right IJ CVC tip in the mid SVC. Left chest wall ICD. IMPRESSION: 1. Right IJ CVC tip in the mid SVC. No pneumothorax. 2. Similar findings of congestive heart failure. Electronically Signed   By: Rozell Cornet M.D.   On: 09/15/2023 02:52   CT Angio Chest PE W and/or Wo Contrast Result Date: 09/14/2023 CLINICAL DATA:  Cold symptoms, chest pain, short of breath, abdominal pain EXAM: CT ANGIOGRAPHY CHEST CT ABDOMEN AND PELVIS WITH CONTRAST TECHNIQUE: Multidetector CT imaging of the chest was performed using the standard protocol during bolus administration of intravenous contrast. Multiplanar CT image reconstructions and MIPs were obtained to evaluate the vascular anatomy. Multidetector CT imaging of the abdomen and pelvis was performed using the standard protocol during bolus administration of intravenous contrast. RADIATION DOSE REDUCTION: This exam was performed according to the departmental dose-optimization program which includes automated exposure control, adjustment of the mA and/or kV according to patient size and/or use of iterative reconstruction technique. CONTRAST:  OMNIPAQUE  IOHEXOL  350 MG/ML SOLN COMPARISON:  01/02/2010, 08/23/2013, 09/14/2023 FINDINGS: CTA CHEST FINDINGS Cardiovascular: This is a technically adequate evaluation of the pulmonary vasculature. No filling defects or pulmonary emboli. Mild cardiomegaly with left ventricular dilatation. No pericardial effusion. Dual lead cardiac pacer, proximal lead in the right atrium and distal lead in the right ventricle. 4.1 cm ascending thoracic aortic aneurysm. No evidence of dissection. Atherosclerosis of the aorta and coronary vasculature. Mediastinum/Nodes: Endotracheal tube terminates just above carina. Enteric catheter extends  into the gastric lumen. No pathologic adenopathy. Lungs/Pleura: There is dense bilateral perihilar airspace disease, greatest in the lower lobes. Trace bilateral pleural effusions. No pneumothorax. Central airways are patent. Musculoskeletal: No acute or destructive bony abnormalities. Reconstructed images demonstrate no additional findings. Review of the MIP images confirms the above findings. CT ABDOMEN and PELVIS FINDINGS Hepatobiliary: No focal liver abnormality is seen. No gallstones, gallbladder wall thickening, or biliary dilatation. Pancreas: Unremarkable. No pancreatic ductal dilatation or surrounding inflammatory changes. Spleen: Normal in size without focal abnormality. Adrenals/Urinary Tract: Adrenal glands are unremarkable. Kidneys are normal, without renal calculi, focal lesion, or hydronephrosis. The bladder is decompressed with an indwelling Foley catheter. Stomach/Bowel: No bowel obstruction or ileus. Normal appendix right lower quadrant. Scattered colonic diverticulosis without evidence of acute diverticulitis. Enteric catheter tip within the lumen of the gastric body. Vascular/Lymphatic: Aortic atherosclerosis. No enlarged abdominal or pelvic lymph nodes. Reproductive: Stable enlargement of the prostate. Other: No free fluid or free intraperitoneal gas. No abdominal wall hernia. Musculoskeletal: No acute or destructive bony abnormalities. Postsurgical changes from L2-L4 posterior fusion. Reconstructed images demonstrate no additional findings. Review of the MIP images confirms the above findings. IMPRESSION: Chest: 1. No evidence of pulmonary embolus. 2. Dense bilateral perihilar airspace disease, greatest in the lower lobes, which could reflect widespread infection or edema.  3. Trace bilateral pleural effusions. 4. Aortic Atherosclerosis (ICD10-I70.0). Coronary artery atherosclerosis. 5. 4.1 cm ascending thoracic aortic aneurysm. Recommend annual imaging followup by CTA or MRA. This  recommendation follows 2010 ACCF/AHA/AATS/ACR/ASA/SCA/SCAI/SIR/STS/SVM Guidelines for the Diagnosis and Management of Patients with Thoracic Aortic Disease. Circulation. 2010; 121: U981-X914. Aortic aneurysm NOS (ICD10-I71.9) Abdomen/pelvis: 1. No acute intra-abdominal or intrapelvic process. Normal appendix. 2. Distal colonic diverticulosis without diverticulitis. 3. Enlarged prostate. 4.  Aortic Atherosclerosis (ICD10-I70.0). Electronically Signed   By: Bobbye Burrow M.D.   On: 09/14/2023 22:40   CT ABDOMEN PELVIS W CONTRAST Result Date: 09/14/2023 CLINICAL DATA:  Cold symptoms, chest pain, short of breath, abdominal pain EXAM: CT ANGIOGRAPHY CHEST CT ABDOMEN AND PELVIS WITH CONTRAST TECHNIQUE: Multidetector CT imaging of the chest was performed using the standard protocol during bolus administration of intravenous contrast. Multiplanar CT image reconstructions and MIPs were obtained to evaluate the vascular anatomy. Multidetector CT imaging of the abdomen and pelvis was performed using the standard protocol during bolus administration of intravenous contrast. RADIATION DOSE REDUCTION: This exam was performed according to the departmental dose-optimization program which includes automated exposure control, adjustment of the mA and/or kV according to patient size and/or use of iterative reconstruction technique. CONTRAST:  OMNIPAQUE  IOHEXOL  350 MG/ML SOLN COMPARISON:  01/02/2010, 08/23/2013, 09/14/2023 FINDINGS: CTA CHEST FINDINGS Cardiovascular: This is a technically adequate evaluation of the pulmonary vasculature. No filling defects or pulmonary emboli. Mild cardiomegaly with left ventricular dilatation. No pericardial effusion. Dual lead cardiac pacer, proximal lead in the right atrium and distal lead in the right ventricle. 4.1 cm ascending thoracic aortic aneurysm. No evidence of dissection. Atherosclerosis of the aorta and coronary vasculature. Mediastinum/Nodes: Endotracheal tube terminates just  above carina. Enteric catheter extends into the gastric lumen. No pathologic adenopathy. Lungs/Pleura: There is dense bilateral perihilar airspace disease, greatest in the lower lobes. Trace bilateral pleural effusions. No pneumothorax. Central airways are patent. Musculoskeletal: No acute or destructive bony abnormalities. Reconstructed images demonstrate no additional findings. Review of the MIP images confirms the above findings. CT ABDOMEN and PELVIS FINDINGS Hepatobiliary: No focal liver abnormality is seen. No gallstones, gallbladder wall thickening, or biliary dilatation. Pancreas: Unremarkable. No pancreatic ductal dilatation or surrounding inflammatory changes. Spleen: Normal in size without focal abnormality. Adrenals/Urinary Tract: Adrenal glands are unremarkable. Kidneys are normal, without renal calculi, focal lesion, or hydronephrosis. The bladder is decompressed with an indwelling Foley catheter. Stomach/Bowel: No bowel obstruction or ileus. Normal appendix right lower quadrant. Scattered colonic diverticulosis without evidence of acute diverticulitis. Enteric catheter tip within the lumen of the gastric body. Vascular/Lymphatic: Aortic atherosclerosis. No enlarged abdominal or pelvic lymph nodes. Reproductive: Stable enlargement of the prostate. Other: No free fluid or free intraperitoneal gas. No abdominal wall hernia. Musculoskeletal: No acute or destructive bony abnormalities. Postsurgical changes from L2-L4 posterior fusion. Reconstructed images demonstrate no additional findings. Review of the MIP images confirms the above findings. IMPRESSION: Chest: 1. No evidence of pulmonary embolus. 2. Dense bilateral perihilar airspace disease, greatest in the lower lobes, which could reflect widespread infection or edema. 3. Trace bilateral pleural effusions. 4. Aortic Atherosclerosis (ICD10-I70.0). Coronary artery atherosclerosis. 5. 4.1 cm ascending thoracic aortic aneurysm. Recommend annual imaging  followup by CTA or MRA. This recommendation follows 2010 ACCF/AHA/AATS/ACR/ASA/SCA/SCAI/SIR/STS/SVM Guidelines for the Diagnosis and Management of Patients with Thoracic Aortic Disease. Circulation. 2010; 121: N829-F621. Aortic aneurysm NOS (ICD10-I71.9) Abdomen/pelvis: 1. No acute intra-abdominal or intrapelvic process. Normal appendix. 2. Distal colonic diverticulosis without diverticulitis. 3. Enlarged prostate. 4.  Aortic Atherosclerosis (ICD10-I70.0). Electronically  Signed   By: Bobbye Burrow M.D.   On: 09/14/2023 22:40   CT HEAD WO CONTRAST ( ) Result Date: 09/14/2023 CLINICAL DATA:  Head trauma, minor (Age >= 65y) EXAM: CT HEAD WITHOUT CONTRAST TECHNIQUE: Contiguous axial images were obtained from the base of the skull through the vertex without intravenous contrast. RADIATION DOSE REDUCTION: This exam was performed according to the departmental dose-optimization program which includes automated exposure control, adjustment of the mA and/or kV according to patient size and/or use of iterative reconstruction technique. COMPARISON:  CT head 08/15/2022 FINDINGS: Brain: Bilateral anterior inferior frontal lobe and bilateral anterior temporal lobe encephalomalacia. Left cerebellar encephalomalacia. No evidence of large-territorial acute infarction. No parenchymal hemorrhage. No mass lesion. No extra-axial collection. No mass effect or midline shift. No hydrocephalus. Basilar cisterns are patent. Vascular: No hyperdense vessel. Atherosclerotic calcifications are present within the cavernous internal carotid and vertebral arteries. Skull: No acute fracture or focal lesion. Old left occipital skull fracture. Sinuses/Orbits: Paranasal sinuses and mastoid air cells are clear. Bilateral lens replacement. Bilateral scleral buckle. Otherwise the orbits are unremarkable. Other: Partially visualized endotracheal tube. IMPRESSION: No acute intracranial abnormality. Electronically Signed   By: Morgane  Naveau M.D.   On:  09/14/2023 22:36   DG Abd Portable 1 View Result Date: 09/14/2023 CLINICAL DATA:  Enteric catheter placement EXAM: PORTABLE ABDOMEN - 1 VIEW COMPARISON:  None Available. FINDINGS: Frontal view of the lower chest and upper abdomen demonstrates enteric catheter passing below diaphragm tip projecting over gastric body. Interstitial and ground-glass opacities throughout the lungs consistent with edema. Unremarkable bowel gas pattern. IMPRESSION: 1. Enteric catheter tip projects over gastric body. Electronically Signed   By: Bobbye Burrow M.D.   On: 09/14/2023 20:55   DG Chest Port 1 View Result Date: 09/14/2023 CLINICAL DATA:  Intubated, CHF, tachypnea EXAM: PORTABLE CHEST 1 VIEW COMPARISON:  09/14/2023 FINDINGS: Single frontal view of the chest demonstrates endotracheal tube overlying tracheal air column, tip of proximally 2 cm above carina. Dual lead pacemaker/AICD unchanged. Cardiac silhouette remains mildly enlarged. Stable interstitial and ground-glass opacities throughout the lungs. No large effusion or pneumothorax. No acute bony abnormalities. IMPRESSION: 1. No complication after intubation. 2. Stable findings of congestive heart failure. Electronically Signed   By: Bobbye Burrow M.D.   On: 09/14/2023 20:55   DG Chest Port 1 View Result Date: 09/14/2023 CLINICAL DATA:  Tachypnea.  CHF EXAM: PORTABLE CHEST 1 VIEW COMPARISON:  X-ray 01/14/2022 and older FINDINGS: Left upper chest battery pack for defibrillator with leads along the right side of the heart. Stable cardiopericardial silhouette. Tortuous aorta. Increasing vascular congestion and component of possible edema. No pneumothorax or effusion. No consolidation. Degenerative changes along the spine. Overlapping cardiac leads. IMPRESSION: Increasing vascular congestion and interstitial changes, possible edema. Defibrillator. Electronically Signed   By: Adrianna Horde M.D.   On: 09/14/2023 18:20    ECHO as above  TELEMETRY reviewed by me 09/24/2023:  Sinus rhythm, rate 80s. (No further VT on tele)  EKG reviewed by me: Sinus tachycardia rate 144 bpm with ST elevation V2/V3. Repeat EKG ST changes resolved.   Data reviewed by me 09/24/2023: last 24h vitals tele labs imaging I/O critical care team notes,   Principal Problem:   Acute respiratory failure with hypoxia (HCC) Active Problems:   Sepsis (HCC)   Altered mental status   HFrEF (heart failure with reduced ejection fraction) (HCC)   Non-ST elevation MI (NSTEMI) (HCC)   Aspiration pneumonia of both lower lobes (HCC)    ASSESSMENT AND  PLAN:  Blake [name] is a 73 y.o. male  with a past medical history of coronary artery disease s/p stent to LAD, ischemic cardiomyopathy s/p ICD ([employer]), chronic HFrEF, OSA (on CPAP), hx CVA who presented to the ED on 09/14/2023 for dyspnea, chest pain and AMS. Patient recently seen at urgent care for nonproductive cough, chills/diaphoresis Troponins found to be elevated.  Cardiology was consulted for further evaluation  # NSTEMI # Acute hypoxic respiratory failure  # Acute metabolic encephalopathy  # Sepsis # Acute on chronic HFrEF # Ischemic cardiomyopathy s/p ICD # Nonsustained VT Patient presented to ED with chest pain, dyspnea. Patient intubated for airway protection. BNP elevated at 570.  Troponins elevated and trending 100 > 7400 > 15300 > 15200. Sinus tachycardia rate 144 bpm with ST elevation V2/V3. Repeat EKG ST changes resolved.  Echo this admission with  rEF (35-40%), with anterolateral wall and apical lateral segment hypokinesis (unchanged from prior echo in 2020). Patient intubated and off Levophed  (05/30). BNP elevated 670 > 960. CXR with significant pulmonary edema. VT noted per tele on 05/30 and amio gtt started. Patient appears euvolemic. Per tele no evidence of further VT. PEG placed (06/03) and trach placement (06/04) due to unable to wean from ventilator.  -Completed 48 hrs of heparin  gtt for medical management of  NSTEMI. -Monitor and replenish electrolytes for a goal K >4, Mag >2. -Continue PO amio 400 mg daily per tube.  -Continue aspirin  81 mg, atorvastatin  40 mg per tube daily. -Continue spironolactone 12.5 mg and metoprolol  succinate 25 mg daily. Uptitrate as BP allows. -Consider resuming losartan prior to discharge if BP remains stable.  -Can give lasix  as needed for volume overload.  -Will not initiate SGLT2i at this time given risk of infection. -No plan for LHC as this will not change patient's management. Continue medical management and optimizing GDMT. Discussed this with patient's daughter and son and they are agreeable/understanding. -Acute hypoxic respiratory failure, acute metabolic encephalopathy, sepsis management per primary.   This patient's plan of care was discussed and created with Dr. Bob Burn and he is in agreement.  Signed: Creighton Doffing, PA-C  09/24/2023, 1:53 PM [facility]

## 2023-09-24 NOTE — Anesthesia Postprocedure Evaluation (Signed)
 Anesthesia Post Note  Patient: Blake [name]  Procedure(s) Performed: CREATION, TRACHEOSTOMY (Neck)  Patient location during evaluation: SICU Anesthesia Type: General Level of consciousness: sedated Pain management: pain level controlled Vital Signs Assessment: post-procedure vital signs reviewed and stable Respiratory status: patient on ventilator - see flowsheet for VS Cardiovascular status: stable Postop Assessment: no apparent nausea or vomiting Anesthetic complications: no   No notable events documented.   Last Vitals:  Vitals:   09/24/23 0630 09/24/23 0700  BP: 137/81 124/75  Pulse: 79 80  Resp: 19 14  Temp:    SpO2: 98% 96%    Last Pain:  Vitals:   09/24/23 0400  TempSrc: Axillary  PainSc:                  Josefine Nice

## 2023-09-24 NOTE — TOC Initial Note (Signed)
 Transition of Care [facility]) - Initial/Assessment Note    Patient Details  Name: Blake [name] MRN: [id number] Date of Birth: 08/23/1950  [facility]) CM/SW Contact:    Rafael Salway A Aldridge Krzyzanowski, RN Phone Number: 09/24/2023, 4:23 PM  Clinical Narrative:                 Chart reviewed.  Noted that patient was admitted with Acute Respiratory Failure with hypoxia.  Patient was admitted to ICU and required ventilator.  Patient was extubated on 05/28 and re-intubated on overnight on 5/28.  Patient received a peg on 06/03 and trach on 06/04.  Patient remains on vent/trach.    I have meet with Mrs. Carmelina Chinchilla patient's wife at bedside today.  She informs me that prior to admission patient was independent.  She reported that he was able to walk and bath and dress himself.  Carmelina Chinchilla reports that patient has not driven in 10 years.  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    I have discussed discharge options with Mrs. Carmelina Chinchilla.  We have discussed home with nursing support, LTAC, Hospice and skilled nursing placement.  Mrs. Brumett would like to explore the LTAC options at this time.  I have asked Cierra with [facility] to speak with Carmelina Chinchilla about LTAC services.    TOC will continue to follow for discharge planning.     Expected Discharge Plan:  (TBD) Barriers to Discharge:  (TBD)   Patient Goals and CMS Choice   CMS Medicare.gov Compare Post Acute Care list provided to:: Patient Represenative (must comment) (Patient wife Jabori Henegar) Choice offered to / list presented to : Spouse      Expected Discharge Plan and Services   Discharge Planning Services: CM Consult   Living arrangements for the past 2 months: Single Family Home                                      Prior Living Arrangements/Services Living arrangements for the past 2 months: Single Family Home Lives with:: Spouse Patient language and need for interpreter reviewed:: Yes          Care  giver support system in place?: Yes (comment) (Patient has a supportive wife.)      Activities of Daily Living   ADL Screening (condition at time of admission) Independently performs ADLs?: Yes (appropriate for developmental age) Is the patient deaf or have difficulty hearing?: No Does the patient have difficulty seeing, even when wearing glasses/contacts?: Yes Does the patient have difficulty concentrating, remembering, or making decisions?: Yes  Permission Sought/Granted                  Emotional Assessment Appearance:: Appears stated age Attitude/Demeanor/Rapport: Unable to Assess (patient has trach/vent) Affect (typically observed): Unable to Assess (Patient has trach/vent)        Admission diagnosis:  Acute respiratory failure with hypoxia (HCC) [J96.01] Altered mental status, unspecified altered mental status type [R41.82] Sepsis, due to unspecified organism, unspecified whether acute organ dysfunction present [facility]) [A41.9] Patient Active Problem List   Diagnosis Date Noted   Sepsis (HCC) 09/15/2023   Altered mental status 09/15/2023   HFrEF (heart failure with reduced ejection fraction) (HCC) 09/15/2023   Non-ST elevation MI (NSTEMI) (HCC) 09/15/2023   Aspiration pneumonia of both lower lobes (HCC) 09/15/2023   Acute respiratory failure with hypoxia (HCC) 09/14/2023  Hyponatremia 01/16/2022   Acute hypoxemic respiratory failure due to COVID-19 [facility]) 01/15/2022   Essential hypertension 01/15/2022   Chronic systolic CHF (congestive heart failure) (HCC) 01/15/2022   Coronary artery disease 01/15/2022   Hypokalemia 01/15/2022   Metabolic acidosis 01/15/2022   S/P insertion of non-drug eluting coronary artery stent 06/08/2019   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) 06/05/2017   Chronic left shoulder pain 04/03/2016   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   Executive function deficit 03/06/2016    Major depression, recurrent, chronic (HCC) 03/06/2016   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   Pseudophakia of both eyes 04/20/2015   Encounter for long-term (current) use of medications 11/21/2014   Hypertension 02/02/2014   Mixed hyperlipidemia 02/02/2014   CAD (coronary artery disease) 09/21/2013   BPH with obstruction/lower urinary tract symptoms 07/11/2013   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 [place] [address] Phone: [phone] Fax: [phone]  [facility] [id number] [place], [place] - [address] [place] [address] Phone: [phone] Fax: [phone]  [facility] [address], [place] - [address] [place] [address] Phone: [phone] Fax: [phone]  [facility] [address] [place] - [address] [place] [address] Phone: [phone] Fax: [phone]  [facility] - [place], [place] - [address] [place] [address] Phone: [phone] Fax: [phone]     Social Drivers of Health (SDOH) Social History: SDOH Screenings   Food Insecurity: No Food Insecurity (09/15/2023)  Housing: Low Risk  (09/15/2023)  Transportation Needs: No Transportation Needs (09/15/2023)  Utilities: Not At Risk (09/15/2023)  Depression (PHQ2-9): Low Risk  (08/04/2023)  Social Connections: Patient Unable To Answer (09/15/2023)  Tobacco Use: Low Risk  (09/20/2023)   SDOH Interventions:     Readmission Risk  Interventions     No data to display
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
## 2023-09-24 NOTE — Progress Notes (Signed)
 Nutrition Follow-up  DOCUMENTATION CODES:   Obesity unspecified  INTERVENTION:   Continue Vital 1.5@60ml /hr continuous + ProSource TF 20- Give 60ml daily via tube  Free water  flushes 30ml q4 hours to maintain tube patency   Regimen provides 2240kcal/day, 117g/day protein and 1280ml/day of free water .   Daily weights  NUTRITION DIAGNOSIS:   Inadequate oral intake related to inability to eat as evidenced by NPO status. 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  -Pt s/p IR G-tube placement (50F) 6/3 -Pt s/p tracheostomy 6/4  Pt remains sedated and ventilated via trach. Pt tolerating tube feeds at goal rate via G-tube. Refeed labs table. Per chart, pt appears to be at his UBW currently. Pt +1.8L on his I & Os. Plan is for possible LTACH.    Medications reviewed and include: colace, lovenox , oxycodone , protonix , miralax , aldactone, propofol    Labs reviewed: K 4.2 wnl, P 2.5 wnl, Mg 2.6(H) Wbc- 11.1(H) Cbgs- 128, 118, 132 x 24 hrs   Patient is currently intubated on ventilator support MV: 9.0 L/min Temp (24hrs), Avg:98.9 F (37.2 C), Min:98.6 F (37 C), Max:99.3 F (37.4 C)  Propofol : 13.67 ml/hr- provides 361kcal/day   UOP-   Diet Order:   Diet Order             Diet NPO time specified  Diet effective now                  EDUCATION NEEDS:   Not appropriate for education at this time  Skin:  Skin Assessment: Reviewed RN Assessment  Last BM:  6/4- TYPE 6  Height:   Ht Readings from Last 1 Encounters:  09/18/23 5' 5.98" (1.676  m)    Weight:   Wt Readings from Last 1 Encounters:  09/24/23 84.2 kg    Ideal Body Weight:  64.5 kg  BMI:  Body mass index is 29.98 kg/m.  Estimated Nutritional Needs:   Kcal:  2000-2300kcal/day  Protein:  100-115g/day  Fluid:  1.7-2.0L/day  Torrance Freestone MS, RD, LDN If unable to be reached, please send secure chat to "RD inpatient" available from 8:00a-4:00p daily

## 2023-09-25 ENCOUNTER — Encounter: Payer: Self-pay | Admitting: Otolaryngology

## 2023-09-25 DIAGNOSIS — J9602 Acute respiratory failure with hypercapnia: Secondary | ICD-10-CM | POA: Diagnosis not present

## 2023-09-25 DIAGNOSIS — J9601 Acute respiratory failure with hypoxia: Secondary | ICD-10-CM | POA: Diagnosis not present

## 2023-09-25 DIAGNOSIS — I5021 Acute systolic (congestive) heart failure: Secondary | ICD-10-CM | POA: Diagnosis not present

## 2023-09-25 DIAGNOSIS — A419 Sepsis, unspecified organism: Secondary | ICD-10-CM | POA: Diagnosis not present

## 2023-09-25 LAB — COOXEMETRY PANEL
Carboxyhemoglobin: 2.2 % — ABNORMAL HIGH (ref 0.5–1.5)
Methemoglobin: 0.7 % (ref 0.0–1.5)
O2 Saturation: 78.3 %
Total hemoglobin: 14 g/dL (ref 12.0–16.0)
Total oxygen content: 76.1 %

## 2023-09-25 LAB — BASIC METABOLIC PANEL WITH GFR
Anion gap: 5 (ref 5–15)
BUN: 31 mg/dL — ABNORMAL HIGH (ref 8–23)
CO2: 30 mmol/L (ref 22–32)
Calcium: 8.3 mg/dL — ABNORMAL LOW (ref 8.9–10.3)
Chloride: 112 mmol/L — ABNORMAL HIGH (ref 98–111)
Creatinine, Ser: 0.82 mg/dL (ref 0.61–1.24)
GFR, Estimated: >60 mL/min (ref >60)
Glucose, Bld: 146 mg/dL — ABNORMAL HIGH (ref 70–99)
Potassium: 4.1 mmol/L (ref 3.5–5.1)
Sodium: 147 mmol/L — ABNORMAL HIGH (ref 135–145)

## 2023-09-25 LAB — CBC
HCT: 41.5 % (ref 39.0–52.0)
Hemoglobin: 13.5 g/dL (ref 13.0–17.0)
MCH: 31.5 pg (ref 26.0–34.0)
MCHC: 32.5 g/dL (ref 30.0–36.0)
MCV: 96.7 fL (ref 80.0–100.0)
Platelets: 340 10*3/uL (ref 150–400)
RBC: 4.29 MIL/uL (ref 4.22–5.81)
RDW: 13.5 % (ref 11.5–15.5)
WBC: 16.8 10*3/uL — ABNORMAL HIGH (ref 4.0–10.5)
nRBC: 0 % (ref 0.0–0.2)

## 2023-09-25 LAB — GLUCOSE, CAPILLARY
Glucose-Capillary: 157 mg/dL — ABNORMAL HIGH (ref 70–99)
Glucose-Capillary: 170 mg/dL — ABNORMAL HIGH (ref 70–99)
Glucose-Capillary: 156 mg/dL — ABNORMAL HIGH (ref 70–99)
Glucose-Capillary: 167 mg/dL — ABNORMAL HIGH (ref 70–99)
Glucose-Capillary: 182 mg/dL — ABNORMAL HIGH (ref 70–99)

## 2023-09-25 MED ORDER — FREE WATER
100.0000 mL | Status: DC
  Administered 2023-09-25 – 2023-09-29 (×27): 100 mL

## 2023-09-25 MED ORDER — DOCUSATE SODIUM 50 MG/5ML PO LIQD: 100.0000 mg | Freq: Two times a day (BID) | ORAL | 0 refills | Status: AC

## 2023-09-25 MED ORDER — METOPROLOL TARTRATE 25 MG PO TABS: 25.0000 mg | ORAL_TABLET | Freq: Two times a day (BID) | ORAL | Status: AC

## 2023-09-25 MED ORDER — VITAL 1.5 CAL PO LIQD: 1000.0000 mL | ORAL | Status: AC

## 2023-09-25 MED ORDER — ENOXAPARIN SODIUM 40 MG/0.4ML IJ SOSY: 40.0000 mg | PREFILLED_SYRINGE | INTRAMUSCULAR | Status: AC

## 2023-09-25 MED ORDER — QUETIAPINE FUMARATE 25 MG PO TABS: 25.0000 mg | ORAL_TABLET | Freq: Two times a day (BID) | ORAL | Status: DC

## 2023-09-25 MED ORDER — PREDNISOLONE ACETATE 1 % OP SUSP: 1.0000 [drp] | Freq: Every day | OPHTHALMIC | 0 refills | Status: AC

## 2023-09-25 MED ORDER — SPIRONOLACTONE 12.5 MG HALF TABLET
12.5000 mg | ORAL_TABLET | Freq: Every day | ORAL | Status: DC
  Administered 2023-09-25 – 2023-09-27 (×3): 12.5 mg
  Filled 2023-09-25 (×3): qty 1

## 2023-09-25 MED ORDER — PANTOPRAZOLE SODIUM 40 MG IV SOLR: 40.0000 mg | Freq: Every day | INTRAVENOUS | Status: AC

## 2023-09-25 MED ORDER — LOSARTAN POTASSIUM 50 MG PO TABS
25.0000 mg | ORAL_TABLET | Freq: Every day | ORAL | Status: DC
  Administered 2023-09-25 – 2023-09-27 (×3): 25 mg
  Filled 2023-09-25 (×3): qty 1

## 2023-09-25 MED ORDER — METOPROLOL TARTRATE 25 MG PO TABS
25.0000 mg | ORAL_TABLET | Freq: Two times a day (BID) | ORAL | Status: DC
  Administered 2023-09-25 – 2023-09-26 (×3): 25 mg
  Filled 2023-09-25 (×3): qty 1

## 2023-09-25 MED ORDER — NAPHAZOLINE-GLYCERIN 0.012-0.25 % OP SOLN: 1.0000 [drp] | Freq: Four times a day (QID) | OPHTHALMIC | Status: AC | PRN

## 2023-09-25 MED ORDER — ORAL CARE MOUTH RINSE: 15.0000 mL | OROMUCOSAL | Status: AC

## 2023-09-25 MED ORDER — OXYCODONE HCL 5 MG PO TABS: 5.0000 mg | ORAL_TABLET | Freq: Four times a day (QID) | ORAL | 0 refills | Status: AC

## 2023-09-25 MED ORDER — PROSOURCE TF20 ENFIT COMPATIBL EN LIQD: 60.0000 mL | Freq: Every day | ENTERAL | Status: AC

## 2023-09-25 MED ORDER — ACETAMINOPHEN 500 MG PO TABS: 1000.0000 mg | ORAL_TABLET | Freq: Four times a day (QID) | ORAL | 0 refills | Status: AC

## 2023-09-25 MED ORDER — LOSARTAN POTASSIUM 25 MG PO TABS: 25.0000 mg | ORAL_TABLET | Freq: Every day | ORAL | Status: AC

## 2023-09-25 MED ORDER — POLYETHYLENE GLYCOL 3350 17 G PO PACK: 17.0000 g | PACK | Freq: Two times a day (BID) | ORAL | 0 refills | Status: AC

## 2023-09-25 MED ORDER — ATORVASTATIN CALCIUM 40 MG PO TABS: 40.0000 mg | ORAL_TABLET | Freq: Every day | ORAL | Status: AC

## 2023-09-25 MED ORDER — SPIRONOLACTONE 25 MG PO TABS: 12.5000 mg | ORAL_TABLET | Freq: Every day | ORAL | Status: AC

## 2023-09-25 MED ORDER — VALPROIC ACID 250 MG/5ML PO SOLN: 250.0000 mg | Freq: Three times a day (TID) | ORAL | Status: AC

## 2023-09-25 MED ORDER — AMIODARONE HCL 400 MG PO TABS: 400.0000 mg | ORAL_TABLET | Freq: Every day | ORAL | Status: AC

## 2023-09-25 MED ORDER — LATANOPROST 0.005 % OP SOLN
1.0000 [drp] | Freq: Every day | OPHTHALMIC | 12 refills | Status: AC
Start: 2023-09-25 — End: ?

## 2023-09-25 MED ORDER — SPIRONOLACTONE 25 MG PO TABS: 25.0000 mg | ORAL_TABLET | Freq: Every day | ORAL | Status: DC

## 2023-09-25 NOTE — Progress Notes (Signed)
 [facility] CARDIOLOGY PROGRESS NOTE       Patient ID: Blake [name] MRN: [id number] DOB/AGE: 75/27/1952 75 y.o.  Admit date: 09/14/2023 Referring Physician Dr. Vergia Glasgow Primary Physician Rory Collard, MD Primary Cardiologist Dr. Beau Bound Reason for Consultation Elevated trops, NSTEMI  HPI: Blake [name] is a 73 y.o. male  with a past medical history of coronary artery disease s/p stent to LAD, ischemic cardiomyopathy s/p ICD ([employer]), chronic HFrEF, OSA (on CPAP), hx CVA who presented to the ED on 09/14/2023 for dyspnea, chest pain and AMS. Patient recently seen at urgent care for nonproductive cough, chills/diaphoresis Troponins found to be elevated.  Cardiology was consulted for further evaluation.   Interval History: -Patient seen and examined this AM. Patient sedated with trach in place. BP and HR elevated. -Overnight Tele showed no significant events.   -Patient appears euvolemic.   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   Coronary artery disease    Dementia (HCC)    Depression    Disorder of male genital organs 07/11/2013   Dyspnea    Encounter for long-term (current) use of medications 11/21/2014   Erectile dysfunction 07/11/2013   Executive function deficit 03/06/2016   GERD (gastroesophageal reflux disease)    Headache    stress   Hearing loss    Hyperlipidemia    Hypertension    Hypogonadism, 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   Major  depression, recurrent, chronic (HCC) 03/06/2016   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   Mixed hyperlipidemia 02/02/2014   Myocardial infarction (HCC) 08/2013   OCD (obsessive compulsive disorder)    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   Other specified disorder of male genital organs(608.89) 07/11/2013   Prostatic hypertrophy    Pseudophakia of both eyes 04/20/2015   Raynaud's disease    Reduced libido 07/11/2013   Repeated falls    weak left ankle   Scoliosis    Stroke (HCC) 2/16   "light"   Synovitis    knees, ankles    Past Surgical History:  Procedure Laterality Date   BACK SURGERY  2011   rods and screws   CLOSED REDUCTION NASAL FRACTURE Bilateral 06/11/2018   Procedure: CLOSED REDUCTION NASAL FRACTURE;  Surgeon: [name], MD;  Location: [facility] ORS;  Service: ENT;  Laterality: Bilateral;   COLONOSCOPY  2015   CORONARY ANGIOPLASTY     2015 stent   CORONARY STENT INTERVENTION N/A 06/08/2019   Procedure: CORONARY STENT INTERVENTION;  Surgeon: Antonette Batters, MD;  Location: [facility] INVASIVE CV LAB;  Service: Cardiovascular;  Laterality: N/A;   CYSTOSCOPY  1984   EYE SURGERY Bilateral 2005,2006,2009   detached retina, cataract   FOOT ARTHRODESIS Left 05/30/2015   Procedure: ARTHRODESIS FOOT STJ LT FOOT;  Surgeon: Anell Baptist, DPM;  Location: [facility];  Service: Podiatry;  Laterality: Left;  WITH POPLITEAL BLOCK   FOOT SURGERY Left  HERNIA REPAIR Right 1969   inguinal   IR GASTROSTOMY TUBE MOD SED  09/22/2023   LEFT HEART CATH AND CORONARY ANGIOGRAPHY Left 06/08/2019   Procedure: LEFT HEART CATH AND CORONARY ANGIOGRAPHY;  Surgeon: Antonette Batters, MD;  Location: [facility] INVASIVE CV LAB;  Service: Cardiovascular;  Laterality: Left;   SEPTOPLASTY Bilateral 06/11/2018   Procedure: SEPTOPLASTY;  Surgeon: [name], MD;  Location: [facility] ORS;  Service: ENT;  Laterality: Bilateral;   SHOULDER SURGERY Right 2004   skull surgery      TOE SURGERY Right 2009   TONSILLECTOMY  2008    Medications Prior to Admission  Medication Sig Dispense Refill Last Dose/Taking   acetaminophen  (TYLENOL ) 500 MG tablet Take 1,000 mg by mouth every 6 (six) hours as needed for mild pain or moderate pain.   Unknown   aspirin  EC 81 MG tablet Take 81 mg by mouth daily.   09/14/2023 at  6:00 AM   B Complex-C (SUPER B COMPLEX PO) Take 1 tablet by mouth daily.   09/14/2023 Morning   Calcium  Carbonate-Vitamin D  600-200 MG-UNIT CAPS Take 1 tablet by mouth 2 (two) times daily.    09/14/2023 Morning   carvedilol  (COREG ) 3.125 MG tablet Take 3.125 mg by mouth daily.    09/14/2023 Morning   [EXPIRED] cefdinir (OMNICEF) 300 MG capsule Take 300 mg by mouth 2 (two) times daily.   09/14/2023 Noon   clindamycin  (CLEOCIN  T) 1 % lotion Apply 1 application topically 2 (two) times daily as needed.    Unknown   clopidogrel  (PLAVIX ) 75 MG tablet Take 75 mg by mouth daily.   09/14/2023 at  6:00 AM   divalproex  (DEPAKOTE  ER) 250 MG 24 hr tablet Total of 750 mg daily. Take along with 500 mg tab 30 tablet 0 09/14/2023 at  8:00 AM   divalproex  (DEPAKOTE  ER) 500 MG 24 hr tablet Take 1 tablet (500 mg total) by mouth daily. 30 tablet 1 09/14/2023 at  8:00 AM   DULoxetine  (CYMBALTA ) 60 MG capsule Take 60 mg by mouth 2 (two) times daily.    09/14/2023 at  3:30 AM   FIBER PO Take 1 tablet by mouth 2 (two) times daily.    09/14/2023 Morning   furosemide  (LASIX ) 20 MG tablet Take 20 mg by mouth daily.   09/14/2023 Morning   latanoprost (XALATAN) 0.005 % ophthalmic solution Place 1 drop into the left eye daily.   09/14/2023 Morning   losartan (COZAAR) 100 MG tablet Take 100 mg by mouth daily.   09/14/2023 Morning   Melatonin 5 MG TABS Take 10 mg by mouth at bedtime. 30 min before bed   09/13/2023 Bedtime   omeprazole (PRILOSEC) 20 MG capsule Take 20 mg by mouth daily.   09/14/2023 Morning   Probiotic Product (PROBIOTIC DAILY PO) Take 1 tablet by mouth daily. Senior   09/14/2023 Morning   silodosin   (RAPAFLO ) 8 MG CAPS capsule TAKE 1 CAPSULE BY MOUTH DAILY WITH BREAKFAST (Patient taking differently: Take 8 mg by mouth daily.) 90 capsule 3 09/13/2023 at  6:00 PM   sodium chloride  (OCEAN) 0.65 % SOLN nasal spray Place 2 sprays into both nostrils as needed for congestion.   09/14/2023 Morning   tadalafil  (CIALIS ) 5 MG tablet Take 1 tablet by mouth once daily 90 tablet 0 09/14/2023 Morning   SYRINGE-NEEDLE, DISP, 3 ML (B-D 3CC LUER-LOK SYR 22GX1-1/2) 22G X 1-1/2" 3 ML MISC USE AS DIRECTED WITH TESTOSTERONE  12 each 20    testosterone  cypionate (DEPOTESTOSTERONE CYPIONATE)  200 MG/ML injection Inject 0.3 mLs (60 mg total) into the muscle once a week. DISCARD REMAINING AS THESE ARE SINGLE USE VIALS. (Patient taking differently: Inject 60 mg into the muscle every 14 (fourteen) days. EVERY OTHER SATURDAY. DISCARD REMAINING AS THESE ARE SINGLE USE VIALS.) 5 mL 0    Social History   Socioeconomic History   Marital status: Married    Spouse name: cindy   Number of children: 1   Years of education: Not on file   Highest education level: Professional school degree (e.g., MD, DDS, DVM, JD)  Occupational History   Not on file  Tobacco Use   Smoking status: Never   Smokeless tobacco: Never  Vaping Use   Vaping status: Never Used  Substance and Sexual Activity   Alcohol use: No   Drug use: No   Sexual activity: Not Currently    Birth control/protection: None  Other Topics Concern   Not on file  Social History Narrative   Not on file   Social Drivers of Health   Financial Resource Strain: Not on file  Food Insecurity: No Food Insecurity (09/15/2023)   Hunger Vital Sign    Worried About Running Out of Food in the Last Year: Never true    Ran Out of Food in the Last Year: Never true  Transportation Needs: No Transportation Needs (09/15/2023)   PRAPARE - Administrator, Civil Service (Medical): No    Lack of Transportation (Non-Medical): No  Physical Activity: Not on file  Stress: Not  on file  Social Connections: Patient Unable To Answer (09/15/2023)   Social Connection and Isolation Panel [NHANES]    Frequency of Communication with Friends and Family: Patient unable to answer    Frequency of Social Gatherings with Friends and Family: Patient unable to answer    Attends Religious Services: Patient unable to answer    Active Member of Clubs or Organizations: Patient unable to answer    Attends Banker Meetings: Patient unable to answer    Marital Status: Patient unable to answer  Intimate Partner Violence: Unknown (09/15/2023)   Humiliation, Afraid, Rape, and Kick questionnaire    Fear of Current or Ex-Partner: Patient unable to answer    Emotionally Abused: Patient unable to answer    Physically Abused: Not on file    Sexually Abused: Patient unable to answer    Family History  Problem Relation Age of Onset   Asperger's syndrome Mother      Vitals:   09/25/23 0435 09/25/23 0500 09/25/23 0530 09/25/23 0600  BP:  (!) 163/90 (!) 164/92 (!) 162/92  Pulse:  100 (!) 102 100  Resp:  19 19 18   Temp:      TempSrc:      SpO2:  94% 95% 95%  Weight: 81.7 kg     Height:        PHYSICAL EXAM General: Chronically ill-appearing elderly male, well nourished, in no acute distress. HEENT: Normocephalic and atraumatic. Neck: No JVD.   Lungs: Trach in place.  Mechanical breath sounds bilaterally. Heart: HRRR. Normal S1 and S2 without gallops or murmurs.  Abdomen: Non-distended appearing.  Msk: Normal strength and tone for age. Extremities: Warm and well perfused. No clubbing, cyanosis. No edema.  Neuro: Alert and oriented X 3. Psych: Answers questions appropriately.   Labs: Basic Metabolic Panel: Recent Labs    09/23/23 0403 09/24/23 0416 09/25/23 0427  NA 142 142 147*  K 4.3 4.2 4.1  CL 103 106  112*  CO2 32 31 30  GLUCOSE 108* 143* 146*  BUN 34* 39* 31*  CREATININE 0.87 0.90 0.82  CALCIUM  8.3* 8.2* 8.3*  MG 2.3 2.6*  --   PHOS 3.5 2.5  --     Liver Function Tests: No results for input(s): "AST", "ALT", "ALKPHOS", "BILITOT", "PROT", "ALBUMIN" in the last 72 hours.  No results for input(s): "LIPASE", "AMYLASE" in the last 72 hours. CBC: Recent Labs    09/24/23 0416 09/25/23 0427  WBC 11.1* 16.8*  HGB 12.1* 13.5  HCT 36.7* 41.5  MCV 96.3 96.7  PLT 306 340   Cardiac Enzymes: No results for input(s): "CKTOTAL", "CKMB", "CKMBINDEX", "TROPONINIHS" in the last 72 hours.  BNP: No results for input(s): "BNP" in the last 72 hours.  D-Dimer: No results for input(s): "DDIMER" in the last 72 hours. Hemoglobin A1C: No results for input(s): "HGBA1C" in the last 72 hours. Fasting Lipid Panel: Recent Labs    09/24/23 0416  TRIG 150*   Thyroid Function Tests: No results for input(s): "TSH", "T4TOTAL", "T3FREE", "THYROIDAB" in the last 72 hours.  Invalid input(s): "FREET3"  Anemia Panel: No results for input(s): "VITAMINB12", "FOLATE", "FERRITIN", "TIBC", "IRON", "RETICCTPCT" in the last 72 hours.   Radiology: US  EKG SITE RITE Result Date: 09/24/2023 If Site Rite image not attached, placement could not be confirmed due to current cardiac rhythm.  IR GASTROSTOMY TUBE MOD SED Result Date: 09/22/2023 INDICATION: 73 year old with respiratory failure and on the ventilator. Request for placement of a gastrostomy tube. EXAM: PERCUTANEOUS GASTROSTOMY TUBE PLACEMENT WITH FLUOROSCOPIC GUIDANCE Physician: Olive Better. Henn, MD MEDICATIONS: Ancef  2 g; Antibiotics were administered within 1 hour of the procedure. Glucagon 1 mg ANESTHESIA/SEDATION: Patient was intubated and on propofol . Patient was monitored by the radiology nurse throughout the procedure. FLUOROSCOPY: Radiation Exposure Index (as provided by the fluoroscopic device): 43 mGy Kerma COMPLICATIONS: None immediate. PROCEDURE: Informed consent was obtained for a percutaneous gastrostomy tube. The patient was placed on the interventional table. Nasogastric tube was already in place. The  anterior abdomen was prepped and draped in sterile fashion. Maximal barrier sterile technique was utilized including caps, mask, sterile gowns, sterile gloves, sterile drape, hand hygiene and skin antiseptic. Stomach was insufflated with air through the nasogastric tube. Anterior abdomen was anesthetized using 1% lidocaine . Using fluoroscopic guidance, 2 Saf-T-Pexy T fasteners were deployed within the stomach. Incision was made between the T-fasteners. Needle was directed into the stomach between the T-fasteners using fluoroscopic guidance and a wire was advanced into the stomach. The tract was dilated to accommodate a 20 French peel-away sheath. An 44 French Entuit gastrostomy tube was advanced over the wire and through the peel-away sheath. Peel-away sheath was removed. The balloon was inflated with 10 mL of sterile water . Gastrostomy tube was injected with contrast to confirm placement in the stomach. Fluoroscopic images were taken and saved for this procedure. FINDINGS: Contrast injection confirms gastrostomy tube in the stomach. IMPRESSION: 1. Successful placement of a percutaneous gastrostomy tube using fluoroscopic guidance. 2. Plan for removal of the T-fasteners in 10-14 days. Electronically Signed   By: Elene Griffes M.D.   On: 09/22/2023 14:13   DG Chest Port 1 View Result Date: 09/20/2023 CLINICAL DATA:  73 year old male respiratory failure, intubated. EXAM: PORTABLE CHEST 1 VIEW COMPARISON:  Portable chest yesterday and earlier. FINDINGS: Portable AP upright view at 0942 hours. Stable lines and tubes. Left chest pacer/resuscitation pads persist. Stable lung volumes and mediastinal contours. Stable left chest AICD. Stable ventilation since yesterday. No  pneumothorax, pulmonary edema. Patchy left greater than right lung base opacity. Paucity of bowel gas in the upper abdomen. Stable visualized osseous structures. IMPRESSION: 1. Stable lines and tubes. 2. Stable ventilation since yesterday. Patchy left  greater than right lung base opacity. Electronically Signed   By: Marlise Simpers M.D.   On: 09/20/2023 10:26   DG Chest Port 1 View Result Date: 09/19/2023 CLINICAL DATA:  73 year old male with respiratory failure. EXAM: PORTABLE CHEST 1 VIEW COMPARISON:  Portable AP semi upright view at 0751 hours. FINDINGS: Portable AP semi upright view at 0751 hours. Less rotated to the left now. Pacer and resuscitation pads project over the lower chest. Stable left chest AICD. Endotracheal tube tip remains satisfactory. Stable lines and tubes. Stable low lung volumes, patchy perihilar and lung base opacity. Ventilation is stable with no pneumothorax, pleural effusion identified. And ventilation has improved compared to 09/17/2023. Stable cardiac size and mediastinal contours. Paucity of bowel gas now.  Stable visualized osseous structures. IMPRESSION: 1. Stable lines and tubes. 2. Stable ventilation since yesterday, improved since 09/17/2023. Stable low lung volumes and patchy perihilar and lung base opacity. Electronically Signed   By: Marlise Simpers M.D.   On: 09/19/2023 08:02   DG Abd 1 View Result Date: 09/18/2023 CLINICAL DATA:  Check gastric catheter placement EXAM: ABDOMEN - 1 VIEW COMPARISON:  09/17/2023 FINDINGS: Gastric catheter is noted coiled within the stomach. No free air is seen. Postsurgical changes in the lower lumbar spine are noted. No obstructive pattern is seen. IMPRESSION: Gastric catheter in the stomach. Electronically Signed   By: Violeta Grey M.D.   On: 09/18/2023 11:18   DG Chest Port 1 View Result Date: 09/18/2023 CLINICAL DATA:  Shortness of breath and chest pain EXAM: PORTABLE CHEST 1 VIEW COMPARISON:  09/17/2023 FINDINGS: Cardiac shadow is stable. Defibrillator is again noted and stable. Endotracheal 2 and gastric catheter are seen. Lungs are well aerated bilaterally. Improved aeration is noted with decrease in edematous changes. Some basilar opacities are again seen bilaterally. IMPRESSION: Improved  aeration when compared with the prior exam. Some persistent basilar opacities remain. Electronically Signed   By: Violeta Grey M.D.   On: 09/18/2023 11:09   DG Abd 1 View Result Date: 09/17/2023 CLINICAL DATA:  OG tube placement. EXAM: ABDOMEN - 1 VIEW COMPARISON:  09/14/2023 FINDINGS: OG tube tip is positioned in the mid stomach with proximal side port below the expected location of the GE junction. Nonspecific bowel gas pattern within the visualized abdomen. IMPRESSION: OG tube tip is positioned in the mid stomach. Electronically Signed   By: Donnal Fusi M.D.   On: 09/17/2023 06:42   DG Chest Port 1 View Result Date: 09/17/2023 CLINICAL DATA:  73 year old male intubated, central line placement, bilateral airspace opacity. EXAM: PORTABLE CHEST 1 VIEW COMPARISON:  Portable chest 09/15/2023 and earlier. FINDINGS: Portable AP supine view at 0610 hours. Endotracheal tube projects over the midline trachea in good position between the clavicles and carina. Enteric tube has been removed. Stable right IJ approach central line. Stable left chest AICD. Similar lung volumes but diffuse bilateral increased pulmonary opacity with combined reticulonodular and vague/airspace appearance. No pneumothorax or pleural effusion. No air bronchograms. Stable cardiac size and mediastinal contours. No acute osseous abnormality identified. Paucity of bowel gas in the visible abdomen. IMPRESSION: 1. Satisfactory ET tube. Enteric tube removed. Stable right IJ approach central line. 2. Increased bilateral pulmonary opacity from two days ago, differential considerations include progressive pulmonary edema, bilateral infection, ARDS. No pleural  effusion is evident. Electronically Signed   By: Marlise Simpers M.D.   On: 09/17/2023 06:36   ECHOCARDIOGRAM COMPLETE Result Date: 09/16/2023    ECHOCARDIOGRAM REPORT   Patient Name:   DR. Hannah Lewis [name] Date of Exam: 09/15/2023 Medical Rec #:  [id number]             Height:       66.0 in Accession  #:    [id number]            Weight:       200.6 lb Date of Birth:  1950/09/19             BSA:          2.002 m Patient Age:    73 years              BP:           106/68 mmHg Patient Gender: M                     HR:           77 bpm. Exam Location:  [facility] Procedure: 2D Echo, Cardiac Doppler and Color Doppler (Both Spectral and Color            Flow Doppler were utilized during procedure). Indications:     Elevated Troponin  History:         Patient has no prior history of Echocardiogram examinations.                  CAD, Stroke, Signs/Symptoms:Dyspnea; Risk Factors:Hypertension                  and Dyslipidemia.  Sonographer:     Brigid Canada RDCS Referring Phys:  [id number] Delanna Fears Diagnosing Phys: Sabina Custovic IMPRESSIONS  1. Left ventricular ejection fraction, by estimation, is 35 to 40%. The left ventricle has moderately decreased function. The left ventricle demonstrates regional wall motion abnormalities (see scoring diagram/findings for description). Left ventricular  diastolic parameters are consistent with Grade I diastolic dysfunction (impaired relaxation).  2. Right ventricular systolic function is normal. The right ventricular size is normal. There is mildly elevated pulmonary artery systolic pressure. The estimated right ventricular systolic pressure is 42.7 mmHg.  3. The mitral valve is normal in structure. Moderate mitral valve regurgitation. No evidence of mitral stenosis.  4. The aortic valve is normal in structure. Aortic valve regurgitation is not visualized. No aortic stenosis is present.  5. The inferior vena cava is normal in size with greater than 50% respiratory variability, suggesting right atrial pressure of 3 mmHg. FINDINGS  Left Ventricle: Left ventricular ejection fraction, by estimation, is 35 to 40%. The left ventricle has moderately decreased function. The left ventricle demonstrates regional wall motion abnormalities. The left ventricular internal cavity size was  normal in size. There is no left ventricular hypertrophy. Left ventricular diastolic parameters are consistent with Grade I diastolic dysfunction (impaired relaxation).  LV Wall Scoring: The antero-lateral wall and apical lateral segment are hypokinetic. Right Ventricle: The right ventricular size is normal. No increase in right ventricular wall thickness. Right ventricular systolic function is normal. There is mildly elevated pulmonary artery systolic pressure. The tricuspid regurgitant velocity is 2.63  m/s, and with an assumed right atrial pressure of 15 mmHg, the estimated right ventricular systolic pressure is 42.7 mmHg. Left Atrium: Left atrial size was normal in size. Right Atrium: Right atrial size was normal in size. Pericardium:  There is no evidence of pericardial effusion. Mitral Valve: The mitral valve is normal in structure. Moderate mitral valve regurgitation. No evidence of mitral valve stenosis. Tricuspid Valve: The tricuspid valve is normal in structure. Tricuspid valve regurgitation is mild. The aortic valve is normal in structure. Aortic valve regurgitation is not visualized. No aortic stenosis is present. Pulmonic Valve: The pulmonic valve was normal in structure. Pulmonic valve regurgitation is not visualized. Aorta: The aortic root is normal in size and structure. Venous: The inferior vena cava is normal in size with greater than 50% respiratory variability, suggesting right atrial pressure of 3 mmHg. IAS/Shunts: No atrial level shunt detected by color flow Doppler.  LEFT VENTRICLE PLAX 2D LVIDd:         5.50 cm   Diastology LVIDs:         4.80 cm   LV e' medial:    6.71 cm/s LV PW:         1.00 cm   LV E/e' medial:  12.9 LV IVS:        1.00 cm   LV e' lateral:   12.83 cm/s LVOT diam:     2.30 cm   LV E/e' lateral: 6.8 LV SV:         63 LV SV Index:   32 LVOT Area:     4.15 cm  RIGHT VENTRICLE             IVC RV Basal diam:  3.70 cm     IVC diam: 2.30 cm RV S prime:     14.50 cm/s TAPSE  (M-mode): 2.5 cm LEFT ATRIUM             Index        RIGHT ATRIUM           Index LA diam:        4.70 cm 2.35 cm/m   RA Area:     12.60 cm LA Vol (A2C):   61.3 ml 30.61 ml/m  RA Volume:   31.10 ml  15.53 ml/m LA Vol (A4C):   36.9 ml 18.43 ml/m LA Biplane Vol: 48.5 ml 24.22 ml/m  AORTIC VALVE LVOT Vmax:   82.47 cm/s LVOT Vmean:  54.333 cm/s LVOT VTI:    0.153 m AI PHT:      376 msec  AORTA Ao Root diam: 3.20 cm Ao Asc diam:  3.70 cm MITRAL VALVE               TRICUSPID VALVE MV Area (PHT): 5.14 cm    TR Peak grad:   27.7 mmHg MV Decel Time: 148 msec    TR Vmax:        263.00 cm/s MV E velocity: 86.75 cm/s MV A velocity: 55.30 cm/s  SHUNTS MV E/A ratio:  1.57        Systemic VTI:  0.15 m                            Systemic Diam: 2.30 cm Lanell Pinta Custovic Electronically signed by Isabell Manzanilla Signature Date/Time: 09/16/2023/11:03:59 AM    Final    DG Chest Port 1 View Result Date: 09/15/2023 EXAM: 1 VIEW XRAY OF THE CHEST 09/15/2023 10:35:00 AM COMPARISON: 1 view chest x-ray 05/16/2023 at 2:38 PM. CLINICAL HISTORY: Endotracheally intubated. FINDINGS: LUNGS AND PLEURA: Mild edema and bilateral effusions are similar to the prior study. Bibasilar airspace opacity likely reflects atelectasis. HEART AND MEDIASTINUM: The heart  is enlarged. BONES AND SOFT TISSUES: No acute osseous abnormality. LINES AND TUBES: The endotracheal tube is stable, 3.5 cm above the carina. A right IJ line is stable. IMPRESSION: 1. Stable endotracheal tube and right IJ line. 2. Enlarged heart. 3. Mild edema and bilateral effusions, similar to the prior study. 4. Bibasilar airspace opacity, likely atelectasis. Electronically signed by: Audree Leas MD 09/15/2023 01:26 PM EDT RP Workstation: [id number]   DG Chest Port 1 View Result Date: 09/15/2023 CLINICAL DATA:  Central line placement EXAM: PORTABLE CHEST 1 VIEW COMPARISON:  Radiograph and CT 09/14/2023 FINDINGS: Stable cardiomegaly. Pulmonary vascular congestion and bilateral  ground-glass opacities. Small right pleural effusion and right basilar airspace opacities. No pneumothorax. Endotracheal tube tip in the intrathoracic trachea 3.0 cm from the carina. Subdiaphragmatic enteric tube. Right IJ CVC tip in the mid SVC. Left chest wall ICD. IMPRESSION: 1. Right IJ CVC tip in the mid SVC. No pneumothorax. 2. Similar findings of congestive heart failure. Electronically Signed   By: Rozell Cornet M.D.   On: 09/15/2023 02:52   CT Angio Chest PE W and/or Wo Contrast Result Date: 09/14/2023 CLINICAL DATA:  Cold symptoms, chest pain, short of breath, abdominal pain EXAM: CT ANGIOGRAPHY CHEST CT ABDOMEN AND PELVIS WITH CONTRAST TECHNIQUE: Multidetector CT imaging of the chest was performed using the standard protocol during bolus administration of intravenous contrast. Multiplanar CT image reconstructions and MIPs were obtained to evaluate the vascular anatomy. Multidetector CT imaging of the abdomen and pelvis was performed using the standard protocol during bolus administration of intravenous contrast. RADIATION DOSE REDUCTION: This exam was performed according to the departmental dose-optimization program which includes automated exposure control, adjustment of the mA and/or kV according to patient size and/or use of iterative reconstruction technique. CONTRAST:  OMNIPAQUE  IOHEXOL  350 MG/ML SOLN COMPARISON:  01/02/2010, 08/23/2013, 09/14/2023 FINDINGS: CTA CHEST FINDINGS Cardiovascular: This is a technically adequate evaluation of the pulmonary vasculature. No filling defects or pulmonary emboli. Mild cardiomegaly with left ventricular dilatation. No pericardial effusion. Dual lead cardiac pacer, proximal lead in the right atrium and distal lead in the right ventricle. 4.1 cm ascending thoracic aortic aneurysm. No evidence of dissection. Atherosclerosis of the aorta and coronary vasculature. Mediastinum/Nodes: Endotracheal tube terminates just above carina. Enteric catheter extends  into the gastric lumen. No pathologic adenopathy. Lungs/Pleura: There is dense bilateral perihilar airspace disease, greatest in the lower lobes. Trace bilateral pleural effusions. No pneumothorax. Central airways are patent. Musculoskeletal: No acute or destructive bony abnormalities. Reconstructed images demonstrate no additional findings. Review of the MIP images confirms the above findings. CT ABDOMEN and PELVIS FINDINGS Hepatobiliary: No focal liver abnormality is seen. No gallstones, gallbladder wall thickening, or biliary dilatation. Pancreas: Unremarkable. No pancreatic ductal dilatation or surrounding inflammatory changes. Spleen: Normal in size without focal abnormality. Adrenals/Urinary Tract: Adrenal glands are unremarkable. Kidneys are normal, without renal calculi, focal lesion, or hydronephrosis. The bladder is decompressed with an indwelling Foley catheter. Stomach/Bowel: No bowel obstruction or ileus. Normal appendix right lower quadrant. Scattered colonic diverticulosis without evidence of acute diverticulitis. Enteric catheter tip within the lumen of the gastric body. Vascular/Lymphatic: Aortic atherosclerosis. No enlarged abdominal or pelvic lymph nodes. Reproductive: Stable enlargement of the prostate. Other: No free fluid or free intraperitoneal gas. No abdominal wall hernia. Musculoskeletal: No acute or destructive bony abnormalities. Postsurgical changes from L2-L4 posterior fusion. Reconstructed images demonstrate no additional findings. Review of the MIP images confirms the above findings. IMPRESSION: Chest: 1. No evidence of pulmonary embolus. 2. Dense bilateral perihilar  airspace disease, greatest in the lower lobes, which could reflect widespread infection or edema. 3. Trace bilateral pleural effusions. 4. Aortic Atherosclerosis (ICD10-I70.0). Coronary artery atherosclerosis. 5. 4.1 cm ascending thoracic aortic aneurysm. Recommend annual imaging followup by CTA or MRA. This  recommendation follows 2010 ACCF/AHA/AATS/ACR/ASA/SCA/SCAI/SIR/STS/SVM Guidelines for the Diagnosis and Management of Patients with Thoracic Aortic Disease. Circulation. 2010; 121: J191-Y782. Aortic aneurysm NOS (ICD10-I71.9) Abdomen/pelvis: 1. No acute intra-abdominal or intrapelvic process. Normal appendix. 2. Distal colonic diverticulosis without diverticulitis. 3. Enlarged prostate. 4.  Aortic Atherosclerosis (ICD10-I70.0). Electronically Signed   By: Bobbye Burrow M.D.   On: 09/14/2023 22:40   CT ABDOMEN PELVIS W CONTRAST Result Date: 09/14/2023 CLINICAL DATA:  Cold symptoms, chest pain, short of breath, abdominal pain EXAM: CT ANGIOGRAPHY CHEST CT ABDOMEN AND PELVIS WITH CONTRAST TECHNIQUE: Multidetector CT imaging of the chest was performed using the standard protocol during bolus administration of intravenous contrast. Multiplanar CT image reconstructions and MIPs were obtained to evaluate the vascular anatomy. Multidetector CT imaging of the abdomen and pelvis was performed using the standard protocol during bolus administration of intravenous contrast. RADIATION DOSE REDUCTION: This exam was performed according to the departmental dose-optimization program which includes automated exposure control, adjustment of the mA and/or kV according to patient size and/or use of iterative reconstruction technique. CONTRAST:  OMNIPAQUE  IOHEXOL  350 MG/ML SOLN COMPARISON:  01/02/2010, 08/23/2013, 09/14/2023 FINDINGS: CTA CHEST FINDINGS Cardiovascular: This is a technically adequate evaluation of the pulmonary vasculature. No filling defects or pulmonary emboli. Mild cardiomegaly with left ventricular dilatation. No pericardial effusion. Dual lead cardiac pacer, proximal lead in the right atrium and distal lead in the right ventricle. 4.1 cm ascending thoracic aortic aneurysm. No evidence of dissection. Atherosclerosis of the aorta and coronary vasculature. Mediastinum/Nodes: Endotracheal tube terminates just  above carina. Enteric catheter extends into the gastric lumen. No pathologic adenopathy. Lungs/Pleura: There is dense bilateral perihilar airspace disease, greatest in the lower lobes. Trace bilateral pleural effusions. No pneumothorax. Central airways are patent. Musculoskeletal: No acute or destructive bony abnormalities. Reconstructed images demonstrate no additional findings. Review of the MIP images confirms the above findings. CT ABDOMEN and PELVIS FINDINGS Hepatobiliary: No focal liver abnormality is seen. No gallstones, gallbladder wall thickening, or biliary dilatation. Pancreas: Unremarkable. No pancreatic ductal dilatation or surrounding inflammatory changes. Spleen: Normal in size without focal abnormality. Adrenals/Urinary Tract: Adrenal glands are unremarkable. Kidneys are normal, without renal calculi, focal lesion, or hydronephrosis. The bladder is decompressed with an indwelling Foley catheter. Stomach/Bowel: No bowel obstruction or ileus. Normal appendix right lower quadrant. Scattered colonic diverticulosis without evidence of acute diverticulitis. Enteric catheter tip within the lumen of the gastric body. Vascular/Lymphatic: Aortic atherosclerosis. No enlarged abdominal or pelvic lymph nodes. Reproductive: Stable enlargement of the prostate. Other: No free fluid or free intraperitoneal gas. No abdominal wall hernia. Musculoskeletal: No acute or destructive bony abnormalities. Postsurgical changes from L2-L4 posterior fusion. Reconstructed images demonstrate no additional findings. Review of the MIP images confirms the above findings. IMPRESSION: Chest: 1. No evidence of pulmonary embolus. 2. Dense bilateral perihilar airspace disease, greatest in the lower lobes, which could reflect widespread infection or edema. 3. Trace bilateral pleural effusions. 4. Aortic Atherosclerosis (ICD10-I70.0). Coronary artery atherosclerosis. 5. 4.1 cm ascending thoracic aortic aneurysm. Recommend annual imaging  followup by CTA or MRA. This recommendation follows 2010 ACCF/AHA/AATS/ACR/ASA/SCA/SCAI/SIR/STS/SVM Guidelines for the Diagnosis and Management of Patients with Thoracic Aortic Disease. Circulation. 2010; 121: N562-Z308. Aortic aneurysm NOS (ICD10-I71.9) Abdomen/pelvis: 1. No acute intra-abdominal or intrapelvic process. Normal appendix. 2.  Distal colonic diverticulosis without diverticulitis. 3. Enlarged prostate. 4.  Aortic Atherosclerosis (ICD10-I70.0). Electronically Signed   By: Bobbye Burrow M.D.   On: 09/14/2023 22:40   CT HEAD WO CONTRAST ( ) Result Date: 09/14/2023 CLINICAL DATA:  Head trauma, minor (Age >= 65y) EXAM: CT HEAD WITHOUT CONTRAST TECHNIQUE: Contiguous axial images were obtained from the base of the skull through the vertex without intravenous contrast. RADIATION DOSE REDUCTION: This exam was performed according to the departmental dose-optimization program which includes automated exposure control, adjustment of the mA and/or kV according to patient size and/or use of iterative reconstruction technique. COMPARISON:  CT head 08/15/2022 FINDINGS: Brain: Bilateral anterior inferior frontal lobe and bilateral anterior temporal lobe encephalomalacia. Left cerebellar encephalomalacia. No evidence of large-territorial acute infarction. No parenchymal hemorrhage. No mass lesion. No extra-axial collection. No mass effect or midline shift. No hydrocephalus. Basilar cisterns are patent. Vascular: No hyperdense vessel. Atherosclerotic calcifications are present within the cavernous internal carotid and vertebral arteries. Skull: No acute fracture or focal lesion. Old left occipital skull fracture. Sinuses/Orbits: Paranasal sinuses and mastoid air cells are clear. Bilateral lens replacement. Bilateral scleral buckle. Otherwise the orbits are unremarkable. Other: Partially visualized endotracheal tube. IMPRESSION: No acute intracranial abnormality. Electronically Signed   By: Morgane  Naveau M.D.   On:  09/14/2023 22:36   DG Abd Portable 1 View Result Date: 09/14/2023 CLINICAL DATA:  Enteric catheter placement EXAM: PORTABLE ABDOMEN - 1 VIEW COMPARISON:  None Available. FINDINGS: Frontal view of the lower chest and upper abdomen demonstrates enteric catheter passing below diaphragm tip projecting over gastric body. Interstitial and ground-glass opacities throughout the lungs consistent with edema. Unremarkable bowel gas pattern. IMPRESSION: 1. Enteric catheter tip projects over gastric body. Electronically Signed   By: Bobbye Burrow M.D.   On: 09/14/2023 20:55   DG Chest Port 1 View Result Date: 09/14/2023 CLINICAL DATA:  Intubated, CHF, tachypnea EXAM: PORTABLE CHEST 1 VIEW COMPARISON:  09/14/2023 FINDINGS: Single frontal view of the chest demonstrates endotracheal tube overlying tracheal air column, tip of proximally 2 cm above carina. Dual lead pacemaker/AICD unchanged. Cardiac silhouette remains mildly enlarged. Stable interstitial and ground-glass opacities throughout the lungs. No large effusion or pneumothorax. No acute bony abnormalities. IMPRESSION: 1. No complication after intubation. 2. Stable findings of congestive heart failure. Electronically Signed   By: Bobbye Burrow M.D.   On: 09/14/2023 20:55   DG Chest Port 1 View Result Date: 09/14/2023 CLINICAL DATA:  Tachypnea.  CHF EXAM: PORTABLE CHEST 1 VIEW COMPARISON:  X-ray 01/14/2022 and older FINDINGS: Left upper chest battery pack for defibrillator with leads along the right side of the heart. Stable cardiopericardial silhouette. Tortuous aorta. Increasing vascular congestion and component of possible edema. No pneumothorax or effusion. No consolidation. Degenerative changes along the spine. Overlapping cardiac leads. IMPRESSION: Increasing vascular congestion and interstitial changes, possible edema. Defibrillator. Electronically Signed   By: Adrianna Horde M.D.   On: 09/14/2023 18:20    ECHO as above  TELEMETRY reviewed by me 09/25/2023:  Sinus rhythm, rate 90. (No further VT on tele)  EKG reviewed by me: Sinus tachycardia rate 144 bpm with ST elevation V2/V3. Repeat EKG ST changes resolved.   Data reviewed by me 09/25/2023: last 24h vitals tele labs imaging I/O critical care team notes, hospitalist progress note.  Principal Problem:   Acute respiratory failure with hypoxia (HCC) Active Problems:   Sepsis (HCC)   Altered mental status   HFrEF (heart failure with reduced ejection fraction) (HCC)   Non-ST elevation MI (  NSTEMI) (HCC)   Aspiration pneumonia of both lower lobes (HCC)    ASSESSMENT AND PLAN:  Blake [name] is a 73 y.o. male  with a past medical history of coronary artery disease s/p stent to LAD, ischemic cardiomyopathy s/p ICD ([employer]), chronic HFrEF, OSA (on CPAP), hx CVA who presented to the ED on 09/14/2023 for dyspnea, chest pain and AMS. Patient recently seen at urgent care for nonproductive cough, chills/diaphoresis Troponins found to be elevated.  Cardiology was consulted for further evaluation  # NSTEMI # Acute hypoxic respiratory failure  # Acute metabolic encephalopathy  # Sepsis # Acute on chronic HFrEF # Ischemic cardiomyopathy s/p ICD # Nonsustained VT Patient presented to ED with chest pain, dyspnea. Patient intubated for airway protection. BNP elevated at 570.  Troponins elevated and trending 100 > 7400 > 15300 > 15200. Sinus tachycardia rate 144 bpm with ST elevation V2/V3. Repeat EKG ST changes resolved.  Echo this admission with  rEF (35-40%), with anterolateral wall and apical lateral segment hypokinesis (unchanged from prior echo in 2020). Patient intubated and off Levophed  (05/30). BNP elevated 670 > 960. CXR with significant pulmonary edema. VT noted per tele on 05/30 and amio gtt started. Patient appears euvolemic. Per tele no evidence of further VT. PEG placed (06/03) and trach placement (06/04) due to unable to wean from ventilator.  -Completed 48 hrs of heparin  gtt for  medical management of NSTEMI. -Monitor and replenish electrolytes for a goal K >4, Mag >2. -Continue PO amio 400 mg daily per tube.  -Continue aspirin  81 mg, atorvastatin  40 mg per tube daily. -Continue spironolactone 12.5 mg. Consider uptitrating as BP allows. -Increased metoprolol  tartrate to 25 mg twice daily.  -Ordered losartan 25 mg daily. -Can give lasix  as needed for volume overload.  -Will not initiate SGLT2i at this time given risk of infection. -No plan for LHC as this will not change patient's management. Continue medical management and optimizing GDMT. Discussed plan with patient's wife and kids who are agreeable. -Acute hypoxic respiratory failure, acute metabolic encephalopathy, sepsis management per primary.   This patient's plan of care was discussed and created with Dr. Bob Burn and he is in agreement.  Signed: Creighton Doffing, PA-C  09/25/2023, 7:11 AM [facility]

## 2023-09-25 NOTE — Plan of Care (Signed)
  Problem: Clinical Measurements: Goal: Ability to maintain clinical measurements within normal limits will improve Outcome: Progressing Goal: Diagnostic test results will improve Outcome: Progressing Goal: Respiratory complications will improve Outcome: Progressing Goal: Cardiovascular complication will be avoided Outcome: Progressing   Problem: Nutrition: Goal: Adequate nutrition will be maintained Outcome: Progressing   Problem: Coping: Goal: Level of anxiety will decrease Outcome: Progressing   

## 2023-09-25 NOTE — TOC Progression Note (Signed)
 Transition of Care [facility]) - Progression Note    Patient Details  Name: BUREL KAHRE MRN: [id number] Date of Birth: [birth year]  Transition [facility] CM/SW Contact  Jaylei Fuerte A Serenitie Vinton, RN Phone Number: 09/25/2023, 3:18 PM  Clinical Narrative:    Chart reviewed. I have spoke with Mrs.Beckford and she informs she has selected [facility].  Tentative discharge for Monday.  I have informed Sherline Distel with [facility] and she reports that she will have a bed for patient on Monday.    TOC will continue to follow for discharge planning.    Expected Discharge Plan:  (TBD) Barriers to Discharge:  (TBD)  Expected Discharge Plan and Services   Discharge Planning Services: CM Consult   Living arrangements for the past 2 months: Single Family Home Expected Discharge Date: 09/25/23                                     Social Determinants of Health (SDOH) Interventions SDOH Screenings   Food Insecurity: No Food Insecurity (09/15/2023)  Housing: Low Risk  (09/15/2023)  Transportation Needs: No Transportation Needs (09/15/2023)  Utilities: Not At Risk (09/15/2023)  Depression (PHQ2-9): Low Risk  (08/04/2023)  Social Connections: Patient Unable To Answer (09/15/2023)  Tobacco Use: Low Risk  (09/20/2023)    Readmission Risk Interventions     No data to display

## 2023-09-25 NOTE — Progress Notes (Signed)
 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   Per ed reports, and patient's wife who is at the bedside, Patient was seen at the urgent care for chief complaints of chills, body aches, intermittently and nonproductive coughing fits. He was diagnosed with acute upper respiratory infection and was sent home with promethazine-dextromethorphan and cefdinir (OMNICEF). [name] report that he took the prescription and went to bed. Later he woke up altered and delusional and c/o CP and SOB. He was noted to be sweating profusely with abnormal body temp per wife.   ED Course: Initial vital signs showed HR >180 beats/minute, BP >160 mm Hg, the RR 30s-40s breaths/minute, and the oxygen saturation 80s% on NRB and a temperature of 103.63F (39.6C).    Pertinent Labs/Diagnostics Findings: Na+/ K+:138/3.9  Glucose:127 CO2 21 WBC: unremarkable Lactic acid: 1.5~2.6 CK: COVID PCR: Negative troponin:106~7453   BNP:573.5  VBG: pO2 pend; pCO2 37; pH 7.49;  HCO3 28.2, %O2 Sat 43.7.  CXR> CTH> CTA Chest> CT Abd/pelvis>see report   Patient's agitation and restlessness worsen with HR up in the 170s to 180s.  2 mg of Ativan  was trialed but patient did not  calm down. Given worsening symptoms and high risk for decompensation, patient intubated for airway protection. He was started on broad-spectrum antibiotics Ampicillin , vanc, Ceftriaxone  and Acyclovir  due to unclear source for possible infection and cover broadly to include treatment for meningitis. PCCM consulted for admission.  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 and fell off, then he got back onto the horse and fell again on asphalt), HFrEF, ischemic cardiomyopathy, Hyperlipidemia, SVT, CAD, Depression, GERD, Hearing loss of both ears, MI, CVA, Hypertension, Hypotestosteronemia, ICD in place [facility] Scientific D433 DEFIBRILLATOR, RESONATE EL DR DF4 S/N: [phone] implanted 11/21/2021), and OSA on CPAP     Significant Hospital Events: Including procedures, antibiotic start and stop dates in addition to other pertinent events   5/26: Admitted to ICU with acute altered mental status and respiratory failure in the setting of suspected NMS versus sepsis requiring intubation 5/27: fever improved, continued on vasopressors 5/28: extubated in the afternoon. Respiratory failure overnight, re-intubated 5/29: vented, increased oxygen requirements. Family at the bedside. Heart failure consult placed 5/30: vent requirements improved, diuresing, sedated. Reina Cara with SBT 5/31: remains vented, WUA this AM 6/01: vented, sedated, afebrile 6/2 remains on vent 6/3 remains on vent, TRACH PEG TUBE pending 6/4 s/p trach 6/5 tolerated PS 5/5  Interim History / Subjective:  Remains critically ill S/p trach S/p PEG  Requires VENT support for survival    Vent Mode: PRVC FiO2 (%):  [24 %-30 %] 30 % Set Rate:  [15 bmp] 15 bmp Vt Set:  [  500 mL] 500 mL PEEP:  [5 cmH20] 5 cmH20 Pressure Support:  [5 cmH20] 5 cmH20 Plateau Pressure:  [7 cmH20-21  cmH20] 13 cmH20   Objective    Blood pressure (!) 159/88, pulse 94, temperature 99.6 F (37.6 C), temperature source Axillary, resp. rate 17, height 5' 5.98" (1.676 m), weight 81.7 kg, SpO2 95%. CVP:  [1 mmHg-25 mmHg] 8 mmHg  Vent Mode: PRVC FiO2 (%):  [24 %-30 %] 30 % Set Rate:  [15 bmp] 15 bmp Vt Set:  [500 mL] 500 mL PEEP:  [5 cmH20] 5 cmH20 Pressure Support:  [5 cmH20] 5 cmH20 Plateau Pressure:  [7 cmH20-21 cmH20] 13 cmH20   Intake/Output Summary (Last 24 hours) at 09/25/2023 0731 Last data filed at 09/25/2023 0600 Gross per 24 hour  Intake 1347.72 ml  Output 1970 ml  Net -622.28 ml   Filed Weights   09/23/23 0355 09/24/23 0446 09/25/23 0435  Weight: 84.3 kg 84.2 kg 81.7 kg       REVIEW OF SYSTEMS  PATIENT IS UNABLE TO PROVIDE COMPLETE REVIEW OF SYSTEMS DUE TO SEVERE CRITICAL ILLNESS   PHYSICAL EXAMINATION:  GENERAL:critically ill appearing EYES: Pupils equal, round, reactive to light.  No scleral icterus.  MOUTH: Moist mucosal membrane. NECK: Supple. S/p trach PULMONARY: Lungs clear to auscultation, +rhonchi, +wheezing CARDIOVASCULAR: S1 and S2.  Regular rate and rhythm GASTROINTESTINAL: Soft, nontender, -distended. Positive bowel sounds.  MUSCULOSKELETAL: edema.  NEUROLOGIC: sedated SKIN:normal, warm to touch, Capillary refill delayed  Pulses present bilaterally        Assessment and Plan   73 year old male presenting with a few days' history of shortness of breath, viral symptoms, and encephalopathy. He was intubated on presentation to the ED due to agitation for airway protection and admitted to the ICU for further management, Diagnosis of b/l Pneumonia with failure to wean from ventilator, failed one trial of extubation  Severe ACUTE Hypoxic and Hypercapnic Respiratory Failure -continue Mechanical Ventilator support -Wean Fio2 and PEEP as tolerated -VAP/VENT bundle implementation - Wean PEEP & FiO2 as tolerated, maintain SpO2 > 88% - Head of bed  elevated 30 degrees, VAP protocol in place - Plateau pressures less than 30 cm H20  - Intermittent chest x-ray & ABG PRN - Ensure adequate pulmonary hygiene  -will perform SAT/SBT when respiratory parameters are met 6/4 Trach 6/4 6/5 Tolerated PS 5/5   SEPTIC/Cardiogenic  shock-resolving SOURCE-Pneumonia/cardiac failure -use vasopressors to keep MAP>65 as needed -follow ABG and LA as needed  NEUROLOGY Fronto-temporal Dementia Started [place] WUA when wife arrives-does well with family at bedside   INFECTIOUS DISEASE RHINO-Viral pneumonia with superimposed bacterial pneumonia Aspiration Pneumonia  Community Acquired Pneumonia  ACUTE SYSTOLIC CARDIAC FAILURE- EF 35% Ventricular Tachycardia- (5/30) while on SBT for which an amiodarone  gtt was started.  -Lasix  as tolerated -follow up cardiac enzymes as indicated -follow up cardiology recs Type II NSTEMI  CAD s/p PCI to LAD  RENAL -continue Foley Catheter-assess need -Avoid nephrotoxic agents -Follow urine output, BMP -Ensure adequate renal perfusion, optimize oxygenation -Renal dose medications   Intake/Output Summary (Last 24 hours) at 09/25/2023 0733 Last data filed at 09/25/2023 0600 Gross per 24 hour  Intake 1347.72 ml  Output 1970 ml  Net -622.28 ml     ENDO - ICU hypoglycemic\Hyperglycemia protocol -check FSBS per protocol   GI GI PROPHYLAXIS as indicated NUTRITIONAL STATUS DIET-->TF's as tolerated Constipation protocol as indicated   ELECTROLYTES -follow labs as needed -replace as needed -pharmacy consultation and following  RESTRICTIVE TRANSFUSION PROTOCOL  TRANSFUSION  IF HGB<7  or ACTIVE BLEEDING OR DX of ACUTE CORONARY SYNDROMES            Best Practice (right click and "Reselect all SmartList Selections" daily)   Diet/type: tubefeeds DVT prophylaxis LMWH Pressure ulcer(s): N/A GI prophylaxis: PPI Lines: Central line and Arterial Line Foley:  Yes, and it is still needed Code  Status:  full code Last date of multidisciplinary goals of care discussion [09/20/2023]  Labs   CBC: Recent Labs  Lab 09/19/23 0409 09/21/23 0926 09/22/23 0418 09/23/23 0403 09/24/23 0416 09/25/23 0427  WBC 6.9 6.4 7.9 8.9 11.1* 16.8*  NEUTROABS 5.6  --   --   --   --   --   HGB 12.3* 11.8* 12.3* 13.2 12.1* 13.5  HCT 38.1* 36.1* 38.6* 41.4 36.7* 41.5  MCV 97.4 97.3 97.5 97.0 96.3 96.7  PLT 179 235 278 326 306 340    Basic Metabolic Panel: Recent Labs  Lab 09/18/23 1348 09/19/23 0409 09/20/23 0437 09/21/23 0458 09/22/23 0418 09/23/23 0403 09/24/23 0416 09/25/23 0427  NA 144 143 149* 146* 147* 142 142 147*  K 3.9 3.8 3.3* 3.3* 3.5 4.3 4.2 4.1  CL 105 104 103 98 101 103 106 112*  CO2 29 29 35* 38* 36* 32 31 30  GLUCOSE 160* 157* 164* 159* 104* 108* 143* 146*  BUN 43* 46* 42* 42* 39* 34* 39* 31*  CREATININE 1.03 1.02 0.89 0.88 0.80 0.87 0.90 0.82  CALCIUM  7.2* 7.5* 7.6* 7.8* 7.9* 8.3* 8.2* 8.3*  MG 2.3  --  2.7* 2.7*  --  2.3 2.6*  --   PHOS  --  2.5 2.2* 3.4  --  3.5 2.5  --    GFR: Estimated Creatinine Clearance: 78.2 mL/min (by C-G formula based on SCr of 0.82 mg/dL). Recent Labs  Lab 09/19/23 0409 09/20/23 0437 09/21/23 0926 09/22/23 0418 09/23/23 0403 09/24/23 0416 09/25/23 0427  PROCALCITON 1.78 1.20  --   --   --   --   --   WBC 6.9  --    < > 7.9 8.9 11.1* 16.8*   < > = values in this interval not displayed.    Liver Function Tests: No results for input(s): "AST", "ALT", "ALKPHOS", "BILITOT", "PROT", "ALBUMIN" in the last 168 hours.  No results for input(s): "LIPASE", "AMYLASE" in the last 168 hours. No results for input(s): "AMMONIA" in the last 168 hours.  ABG    Component Value Date/Time   PHART 7.44 09/19/2023 0943   PCO2ART 51 (H) 09/19/2023 0943   PO2ART 84 09/19/2023 0943   HCO3 34.6 (H) 09/19/2023 0943   ACIDBASEDEF 0.2 09/17/2023 0834   O2SAT 78.3 09/25/2023 0500     Coagulation Profile: Recent Labs  Lab 09/21/23 1437  INR 1.1     Cardiac Enzymes: No results for input(s): "CKTOTAL", "CKMB", "CKMBINDEX", "TROPONINI" in the last 168 hours.   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), Cognitive deficit as late effect of traumatic brain injury (HCC) (03/06/2016), Coronary artery disease, Dementia (HCC), Depression, Disorder of male  genital organs (07/11/2013), Dyspnea, Encounter for long-term (current) use of medications (11/21/2014), Erectile dysfunction (07/11/2013), Executive function deficit (03/06/2016), GERD (gastroesophageal reflux disease), Headache, Hearing loss, Hyperlipidemia, Hypertension, Hypogonadism, male (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   Surgical History:   Past Surgical History:  Procedure Laterality Date   BACK SURGERY  2011   rods and screws   CLOSED REDUCTION NASAL FRACTURE Bilateral 06/11/2018   Procedure: CLOSED REDUCTION NASAL FRACTURE;  Surgeon: [name], MD;  Location: [facility] ORS;  Service: ENT;   Laterality: Bilateral;   COLONOSCOPY  2015   CORONARY ANGIOPLASTY     2015 stent   CORONARY STENT INTERVENTION N/A 06/08/2019   Procedure: CORONARY STENT INTERVENTION;  Surgeon: Antonette Batters, MD;  Location: [facility] INVASIVE CV [facility];  Service: Cardiovascular;  Laterality: N/A;   CYSTOSCOPY  1984   EYE SURGERY Bilateral 2005,2006,2009   detached retina, cataract   FOOT ARTHRODESIS Left 05/30/2015   Procedure: ARTHRODESIS FOOT STJ LT FOOT;  Surgeon: Anell Baptist, DPM;  Location: [facility];  Service: Podiatry;  Laterality: Left;  WITH POPLITEAL BLOCK   FOOT SURGERY Left    HERNIA REPAIR Right 1969   inguinal   IR GASTROSTOMY TUBE MOD SED  09/22/2023   LEFT HEART CATH AND CORONARY ANGIOGRAPHY Left 06/08/2019   Procedure: LEFT HEART CATH AND CORONARY ANGIOGRAPHY;  Surgeon: Antonette Batters, MD;  Location: [facility] INVASIVE CV LAB;  Service: Cardiovascular;  Laterality: Left;   SEPTOPLASTY Bilateral 06/11/2018   Procedure: SEPTOPLASTY;  Surgeon: [name], MD;  Location: [facility] OR[facility];  Service: ENT;  Laterality: Bilateral;   SHOULDER SURGERY Right 2004   skull surgery     TOE SURGERY Right 2009   TONSILLECTOMY  2008     Social History:   reports that he has never smoked. He has never used smokeless tobacco. He reports that he does not drink alcohol and does not use drugs.   Family History:  His family history includes Asperger's syndrome in his mother.   Allergies Allergies  Allergen Reactions   Promethazine Other (See Comments)    Severe neuroleptic malignant syndrome requiring intubation   Mirtazapine Other (See Comments)    Other reaction(s): Other (See Comments) Irritability/anger, increased appetite, worsening depression Irritability/anger, increased appetite, worsening depression      Home Medications  Prior to Admission medications   Medication Sig Start Date End Date Taking? Authorizing Provider  acetaminophen  (TYLENOL ) 500 MG tablet Take 1,000 mg by mouth every 6  (six) hours as needed for mild pain or moderate pain.   Yes [provider]  aspirin  EC 81 MG tablet Take 81 mg by mouth daily.   Yes [provider]  B Complex-C (SUPER B COMPLEX PO) Take 1 tablet by mouth daily.   Yes [provider]  Calcium  Carbonate-Vitamin D  600-200 MG-UNIT CAPS Take 1 tablet by mouth 2 (two) times daily.    Yes [provider]  carvedilol  (COREG ) 3.125 MG tablet Take 3.125 mg by mouth daily.  11/03/18  Yes [provider]  cefdinir (OMNICEF) 300 MG capsule Take 300 mg by mouth 2 (two) times daily. 09/14/23 09/21/23 Yes [provider]  clindamycin  (CLEOCIN  T) 1 %  lotion Apply 1 application topically 2 (two) times daily as needed.    Yes [provider]  clopidogrel  (PLAVIX ) 75 MG tablet Take 75 mg by mouth daily. 09/27/18  Yes [provider]  divalproex  (DEPAKOTE  ER) 250 MG 24 hr tablet Total of 750 mg daily. Take along with 500 mg tab 08/24/23  Yes Hisada, Ivan Marion, MD  divalproex  (DEPAKOTE  ER) 500 MG 24 hr tablet Take 1 tablet (500 mg total) by mouth daily. 08/13/23 10/12/23 Yes Todd Fossa, MD  DULoxetine  (CYMBALTA ) 60 MG capsule Take 60 mg by mouth 2 (two) times daily.  11/17/18  Yes [provider]  FIBER PO Take 1 tablet by mouth 2 (two) times daily.    Yes [provider]  furosemide  (LASIX ) 20 MG tablet Take 20 mg by mouth daily. 11/03/18  Yes [provider]  latanoprost (XALATAN) 0.005 % ophthalmic solution Place 1 drop into the left eye daily. 11/06/21  Yes [provider]  losartan (COZAAR) 100 MG tablet Take 100 mg by mouth daily.   Yes [provider]  Melatonin 5 MG TABS Take 10 mg by mouth at bedtime. 30 min before bed   Yes [provider]  omeprazole (PRILOSEC) 20 MG capsule Take 20 mg by mouth daily.   Yes [provider]  Probiotic Product (PROBIOTIC DAILY PO) Take 1 tablet by mouth daily. Senior   Yes [provider]   silodosin  (RAPAFLO ) 8 MG CAPS capsule TAKE 1 CAPSULE BY MOUTH DAILY WITH BREAKFAST Patient taking differently: Take 8 mg by mouth daily. 03/16/23  Yes Stoioff, Kizzie Perks, MD  sodium chloride  (OCEAN) 0.65 % SOLN nasal spray Place 2 sprays into both nostrils as needed for congestion.   Yes [provider]  tadalafil  (CIALIS ) 5 MG tablet Take 1 tablet by mouth once daily 08/03/23  Yes Stoioff, Scott C, MD  SYRINGE-NEEDLE, DISP, 3 ML (B-D 3CC LUER-LOK SYR 22GX1-1/2) 22G X 1-1/2" 3 ML MISC USE AS DIRECTED WITH TESTOSTERONE  08/24/23   Stoioff, Kizzie Perks, MD  testosterone  cypionate (DEPOTESTOSTERONE CYPIONATE) 200 MG/ML injection Inject 0.3 mLs (60 mg total) into the muscle once a week. DISCARD REMAINING AS THESE ARE SINGLE USE VIALS. Patient taking differently: Inject 60 mg into the muscle every 14 (fourteen) days. EVERY OTHER SATURDAY. DISCARD REMAINING AS THESE ARE SINGLE USE VIALS. 06/16/23   Geraline Knapp, MD        DVT/GI PRX  assessed I Assessed the need for Labs I Assessed the need for Foley I Assessed the need for Central Venous Line Family Discussion when available I Assessed the need for Mobilization I made an Assessment of medications to be adjusted accordingly Safety Risk assessment completed  CASE DISCUSSED IN MULTIDISCIPLINARY ROUNDS WITH ICU TEAM     Critical Care Time devoted to patient care services described in this note is 55 minutes.    Lady Pier, M.D.  [facility]  Medical Director [facility] [place] Medical Director [facility] Cardio-Pulmonary Department

## 2023-09-25 NOTE — Consult Note (Signed)
 PHARMACY CONSULT NOTE - ELECTROLYTES  Pharmacy Consult for Electrolyte Monitoring and Replacement   Recent Labs: Potassium (mmol/L)  Date Value  09/25/2023 4.1  05/23/2014 4.1   Magnesium  (mg/dL)  Date Value  16/01/9603 2.6 (H)  09/13/2013 1.9   Calcium  (mg/dL)  Date Value  54/12/8117 8.3 (L)   Calcium , Total (mg/dL)  Date Value  14/78/2956 8.5   Albumin (g/dL)  Date Value  21/30/8657 2.8 (L)  05/23/2014 3.7   Phosphorus (mg/dL)  Date Value  84/69/6295 2.5   Sodium (mmol/L)  Date Value  09/25/2023 147 (H)  05/23/2014 143   Height: 5' 5.98" (167.6 cm) Weight: 81.7 kg (180 lb 1.9 oz) IBW/kg (Calculated) : 63.76 Estimated Creatinine Clearance: 78.2 mL/min (by C-G formula based on SCr of 0.82 mg/dL).  Assessment  Blake [name] is a 73 y.o. male presenting with acute respiratory failure and shock. PMH significant for bilateral fronto temporal encephalomalacia due to severe TBI resulting longstanding cognitive deficits, poor social inhibition, apathy, severe TBI (thrown into a field by a horse and fell off, then he got back onto the horse and fell again on asphalt), HFrEF, ischemic cardiomyopathy, hyperlipidemia, SVT, CAD, depression, GERD, hearing loss of both ears, MI, CVA, HTN, hypotestosteronemia, ICD in place [facility] Scientific D433 DEFIBRILLATOR, RESONATE EL DR DF4 S/N: [id number] implanted 11/21/2021), and OSA on CPAP . Pharmacy has been consulted to monitor and replace electrolytes.  Pertinent medications: amiodarone , spironolactone  Goal of Therapy: Electrolytes within normal limits  Plan:  Will increase free water  to 100 mL every 4 hours No electrolyte replacement warranted for today Labs tomorrow if LTAC placement delayed only  Thank you for allowing pharmacy to be a part of this patient's care.  Adalberto Acton 09/25/2023 7:14 AM

## 2023-09-25 NOTE — Discharge Summary (Signed)
 Physician Discharge Summary  Patient ID: VIC ESCO III MRN: [id number] DOB/AGE: Jan 24, 1951 73 y.o.  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   few days' history of shortness of breath, viral symptoms, and encephalopathy. He was intubated on presentation to the ED due to agitation for airway protection and admitted to the ICU for further management, Diagnosis of b/l Pneumonia with failure to wean from ventilator, failed one trial of extubation     Per ed reports, and patient's wife who is at the bedside, Patient was seen at the urgent care for chief complaints of chills, body aches, intermittently and nonproductive coughing fits. He was diagnosed with acute upper respiratory infection and was sent home with promethazine-dextromethorphan and cefdinir (OMNICEF). [name] report that he took the prescription and went to bed.  Later he woke up altered and delusional and c/o CP and SOB. He was noted to be sweating profusely with abnormal body temp per wife.   ED Course: Initial vital signs showed HR >180 beats/minute, BP >160 mm Hg, the RR 30s-40s breaths/minute, and the oxygen saturation 80s% on NRB and a temperature of 103.43F (39.6C).    Pertinent Labs/Diagnostics Findings: Na+/ K+:138/3.9  Glucose:127 CO2 21 WBC: unremarkable Lactic acid: 1.5~2.6 CK: COVID PCR: Negative troponin:106~7453   BNP:573.5  VBG: pO2 pend; pCO2 37; pH 7.49;  HCO3 28.2, %O2 Sat 43.7.  CXR> CTH> CTA Chest> CT Abd/pelvis>see report   Patient's agitation and restlessness worsen with HR up in the 170s to 180s.  2 mg of Ativan  was trialed but patient did not calm down. Given worsening symptoms and high risk for decompensation, patient intubated for airway protection. He was started on broad-spectrum antibiotics Ampicillin , vanc, Ceftriaxone  and Acyclovir  due to unclear source for possible infection and cover broadly to include treatment for meningitis. PCCM consulted for admission.   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 and fell off, then he got back onto the horse and fell again on asphalt), HFrEF, ischemic cardiomyopathy, Hyperlipidemia, SVT, CAD, Depression, GERD, Hearing loss of both ears, MI, CVA, Hypertension, Hypotestosteronemia, ICD in place [facility] [employer] D433 DEFIBRILLATOR, RESONATE EL DR DF4 S/N: [phone] implanted 11/21/2021), and OSA on CPAP       Significant Hospital Events: Including procedures, antibiotic start and stop dates in addition to other pertinent events   5/26: Admitted to ICU with acute altered mental status and respiratory failure  in the setting of suspected NMS versus sepsis requiring intubation 5/27: fever improved, continued on  vasopressors 5/28: extubated in the afternoon. Respiratory failure overnight, re-intubated 5/29: vented, increased oxygen requirements. Family at the bedside. Heart failure consult placed 5/30: vent requirements improved, diuresing, sedated. Reina Cara with SBT 5/31: remains vented, WUA this AM 6/01: vented, sedated, afebrile 6/2 remains on vent 6/3 remains on vent, TRACH PEG TUBE pending 6/4 s/p trach    Discharge Exam: Blood pressure (!) 159/88, pulse 94, temperature 99.6 F (37.6 C), temperature source Axillary, resp. rate 17, height 5' 5.98" (1.676 m), weight 81.7 kg, SpO2 95%.   Disposition: LTACH   Allergies as of 09/25/2023       Reactions   Promethazine Other (See Comments)   Severe neuroleptic malignant syndrome requiring intubation   Mirtazapine Other (See Comments)   Other reaction(s): Other (See Comments) Irritability/anger, increased appetite, worsening depression Irritability/anger, increased appetite, worsening depression        Medication List     STOP taking these medications    aspirin  EC 81 MG tablet   B-D 3CC LUER-LOK SYR 22GX1-1/2 22G X 1-1/2" 3 ML Misc Generic drug: SYRINGE-NEEDLE (DISP) 3 ML   Calcium  Carbonate-Vitamin D  600-200 MG-UNIT Caps   carvedilol  3.125 MG tablet Commonly known as: COREG    cefdinir 300 MG capsule Commonly known as: OMNICEF   clindamycin  1 % lotion Commonly known as: CLEOCIN  T   clopidogrel  75 MG tablet Commonly known as: PLAVIX    divalproex  250 MG 24 hr tablet Commonly known as: Depakote  ER   divalproex  500 MG 24 hr tablet Commonly known as: DEPAKOTE  ER   DULoxetine  60 MG capsule Commonly known as: CYMBALTA    FIBER PO   furosemide  20 MG tablet Commonly known as: LASIX    melatonin 5 MG Tabs   omeprazole 20 MG capsule Commonly known as: PRILOSEC   PROBIOTIC DAILY PO   silodosin  8 MG Caps capsule Commonly known as: RAPAFLO    sodium chloride  0.65 % Soln nasal spray Commonly known as: OCEAN    SUPER B COMPLEX PO   tadalafil  5 MG tablet Commonly known as: CIALIS    testosterone  cypionate 200 MG/ML injection Commonly known as: DEPOTESTOSTERONE CYPIONATE       TAKE these medications    acetaminophen  500 MG tablet Commonly known as: TYLENOL  Place 2 tablets (1,000 mg total) into feeding tube every 6 (six) hours. What changed:  how to take this when to take this reasons to take this   amiodarone  400 MG tablet Commonly known as: PACERONE  Place 1 tablet (400 mg total) into feeding tube daily.   atorvastatin  40 MG tablet Commonly known as: LIPITOR Place 1 tablet (40 mg total) into feeding tube daily.   docusate 50 MG/5ML liquid Commonly known as: COLACE Place 10 mLs (100 mg total) into feeding tube 2 (two) times daily.   enoxaparin  40 MG/0.4ML injection Commonly known as: LOVENOX  Inject 0.4 mLs (40 mg total) into the skin daily.   feeding supplement (PROSource TF20) liquid Place 60 mLs into feeding tube daily.   feeding supplement (VITAL 1.5 CAL) Liqd Place 1,000 mLs into feeding tube continuous.   latanoprost 0.005 % ophthalmic solution Commonly known as: XALATAN Place 1 drop into the left eye at bedtime. What changed: when to take this   losartan 25 MG tablet Commonly known as: COZAAR Place 1 tablet (25 mg total) into feeding tube daily. What changed:  medication strength how much to take how to take this   metoprolol  tartrate  25 MG tablet Commonly known as: LOPRESSOR  Place 1 tablet (25 mg total) into feeding tube 2 (two) times daily.   mouth rinse Liqd solution 15 mLs by Mouth Rinse route every 2 (two) hours.   naphazoline-glycerin  0.012-0.25 % Soln Commonly known as: CLEAR EYES REDNESS Place 1-2 drops into both eyes 4 (four) times daily as needed for eye irritation.   oxyCODONE  5 MG immediate release tablet Commonly known as: Oxy IR/ROXICODONE  Place 1 tablet (5 mg total) into feeding tube every 6 (six) hours.   pantoprazole  40 MG  injection Commonly known as: PROTONIX  Inject 40 mg into the vein at bedtime.   polyethylene glycol 17 g packet Commonly known as: MIRALAX  / GLYCOLAX  Place 17 g into feeding tube 2 (two) times daily.   prednisoLONE acetate 1 % ophthalmic suspension Commonly known as: PRED FORTE Place 1 drop into the right eye daily.   QUEtiapine 25 MG tablet Commonly known as: SEROQUEL Place 1 tablet (25 mg total) into feeding tube 2 (two) times daily.   spironolactone 25 MG tablet Commonly known as: ALDACTONE Place 0.5 tablets (12.5 mg total) into feeding tube daily.   valproic  acid 250 MG/5ML solution Commonly known as: DEPAKENE  Place 5 mLs (250 mg total) into feeding tube 3 (three) times daily.        Follow-up Information     Antonette Batters, MD. Go in 1 week(s).   Specialties: Cardiology, Internal Medicine Contact information: [address] [place] [address] [phone]                 Signed: Cleve Dale 09/25/2023, 8:23 AM

## 2023-09-26 ENCOUNTER — Inpatient Hospital Stay

## 2023-09-26 DIAGNOSIS — A419 Sepsis, unspecified organism: Secondary | ICD-10-CM | POA: Diagnosis not present

## 2023-09-26 DIAGNOSIS — J9601 Acute respiratory failure with hypoxia: Secondary | ICD-10-CM | POA: Diagnosis not present

## 2023-09-26 DIAGNOSIS — I5021 Acute systolic (congestive) heart failure: Secondary | ICD-10-CM | POA: Diagnosis not present

## 2023-09-26 DIAGNOSIS — J9602 Acute respiratory failure with hypercapnia: Secondary | ICD-10-CM | POA: Diagnosis not present

## 2023-09-26 LAB — BASIC METABOLIC PANEL WITH GFR
Anion gap: 9 (ref 5–15)
BUN: 25 mg/dL — ABNORMAL HIGH (ref 8–23)
CO2: 24 mmol/L (ref 22–32)
Calcium: 8.6 mg/dL — ABNORMAL LOW (ref 8.9–10.3)
Chloride: 111 mmol/L (ref 98–111)
Creatinine, Ser: 0.76 mg/dL (ref 0.61–1.24)
GFR, Estimated: >60 mL/min (ref >60)
Glucose, Bld: 138 mg/dL — ABNORMAL HIGH (ref 70–99)
Potassium: 4.5 mmol/L (ref 3.5–5.1)
Sodium: 144 mmol/L (ref 135–145)

## 2023-09-26 LAB — CBC
HCT: 41.7 % (ref 39.0–52.0)
Hemoglobin: 13.3 g/dL (ref 13.0–17.0)
MCH: 31 pg (ref 26.0–34.0)
MCHC: 31.9 g/dL (ref 30.0–36.0)
MCV: 97.2 fL (ref 80.0–100.0)
Platelets: 379 10*3/uL (ref 150–400)
RBC: 4.29 MIL/uL (ref 4.22–5.81)
RDW: 13.5 % (ref 11.5–15.5)
WBC: 20 10*3/uL — ABNORMAL HIGH (ref 4.0–10.5)
nRBC: 0 % (ref 0.0–0.2)

## 2023-09-26 LAB — GLUCOSE, CAPILLARY
Glucose-Capillary: 145 mg/dL — ABNORMAL HIGH (ref 70–99)
Glucose-Capillary: 138 mg/dL — ABNORMAL HIGH (ref 70–99)
Glucose-Capillary: 159 mg/dL — ABNORMAL HIGH (ref 70–99)
Glucose-Capillary: 138 mg/dL — ABNORMAL HIGH (ref 70–99)
Glucose-Capillary: 116 mg/dL — ABNORMAL HIGH (ref 70–99)
Glucose-Capillary: 158 mg/dL — ABNORMAL HIGH (ref 70–99)
Glucose-Capillary: 148 mg/dL — ABNORMAL HIGH (ref 70–99)

## 2023-09-26 LAB — COOXEMETRY PANEL
Carboxyhemoglobin: 2 % — ABNORMAL HIGH (ref 0.5–1.5)
Methemoglobin: 1.1 % (ref 0.0–1.5)
O2 Saturation: 72.5 %
Total hemoglobin: 14 g/dL (ref 12.0–16.0)
Total oxygen content: 70.3 %

## 2023-09-26 MED ORDER — METOPROLOL TARTRATE 50 MG PO TABS
50.0000 mg | ORAL_TABLET | Freq: Two times a day (BID) | ORAL | Status: DC
  Administered 2023-09-26 – 2023-09-27 (×3): 50 mg
  Filled 2023-09-26 (×3): qty 1

## 2023-09-26 MED ORDER — FUROSEMIDE 20 MG PO TABS
20.0000 mg | ORAL_TABLET | Freq: Every day | ORAL | Status: DC
  Administered 2023-09-26 – 2023-09-29 (×4): 20 mg
  Filled 2023-09-26 (×4): qty 1

## 2023-09-26 MED ORDER — METOPROLOL TARTRATE 25 MG PO TABS
25.0000 mg | ORAL_TABLET | Freq: Once | ORAL | Status: AC
  Administered 2023-09-26: 25 mg
  Filled 2023-09-26: qty 1

## 2023-09-26 NOTE — Consult Note (Signed)
 PHARMACY CONSULT NOTE - ELECTROLYTES  Pharmacy Consult for Electrolyte Monitoring and Replacement   Recent Labs: Potassium (mmol/L)  Date Value  09/26/2023 4.5  05/23/2014 4.1   Magnesium  (mg/dL)  Date Value  46/96/2952 2.6 (H)  09/13/2013 1.9   Calcium  (mg/dL)  Date Value  84/13/2440 8.6 (L)   Calcium , Total (mg/dL)  Date Value  02/15/2535 8.5   Albumin (g/dL)  Date Value  64/40/3474 2.8 (L)  05/23/2014 3.7   Phosphorus (mg/dL)  Date Value  25/95/6387 2.5   Sodium (mmol/L)  Date Value  09/26/2023 144  05/23/2014 143   Height: 5' 5.98" (167.6 cm) Weight: 83.3 kg (183 lb 10.3 oz) IBW/kg (Calculated) : 63.76 Estimated Creatinine Clearance: 80.8 mL/min (by C-G formula based on SCr of 0.76 mg/dL).  Assessment  Blake [name] is a 73 y.o. male presenting with acute respiratory failure and shock. PMH significant for bilateral fronto temporal encephalomalacia due to severe TBI resulting longstanding cognitive deficits, poor social inhibition, apathy, severe TBI (thrown into a field by a horse and fell off, then he got back onto the horse and fell again on asphalt), HFrEF, ischemic cardiomyopathy, hyperlipidemia, SVT, CAD, depression, GERD, hearing loss of both ears, MI, CVA, HTN, hypotestosteronemia, ICD in place [facility] [employer] D433 DEFIBRILLATOR, RESONATE EL DR DF4 S/N: [id number] implanted 11/21/2021), and OSA on CPAP . Pharmacy has been consulted to monitor and replace electrolytes.  Pertinent medications: amiodarone , spironolactone ,  free water  to 100 mL every 4 hours  Goal of Therapy: Electrolytes within normal limits  Plan:  No replacement needed.  F/u with AM labs.   Thank you for allowing pharmacy to be a part of this patient's care.  Trinidad Funk, PharmD, BCPS 09/26/2023 8:33 AM

## 2023-09-26 NOTE — Progress Notes (Signed)
 [facility]  CARDIOLOGY PROGRESS NOTE  Patient ID: Blake [name] MRN: [id number] DOB/AGE: Sep 11, 1950 73 y.o.  Admit date: 09/14/2023 Referring Physician Dr. Auston Left Reason for Consultation NSTEMI, heart failure  HPI: Patient examined at bedside, wife present.  Trach in place, minimally responsive.  Off sedation, blood pressure and heart rate elevated.  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   Chronic left shoulder pain 04/03/2016   Chronic prostatitis 07/11/2013   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   Coronary artery disease    Dementia (HCC)    Depression    Disorder of male genital organs 07/11/2013   Dyspnea    Encounter for long-term (current) use of medications 11/21/2014   Erectile dysfunction 07/11/2013   Executive function deficit 03/06/2016   GERD (gastroesophageal reflux disease)    Headache    stress   Hearing loss    Hyperlipidemia    Hypertension    Hypogonadism, 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   Major depression, recurrent, chronic (HCC) 03/06/2016   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   Mixed hyperlipidemia 02/02/2014   Myocardial infarction (HCC) 08/2013   OCD (obsessive compulsive disorder)    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   Other specified disorder of male genital organs(608.89) 07/11/2013   Prostatic hypertrophy    Pseudophakia of both eyes 04/20/2015   Raynaud's disease    Reduced libido 07/11/2013   Repeated falls    weak left ankle   Scoliosis    Stroke (HCC) 2/16   "light"   Synovitis    knees, ankles     Past Surgical History:  Procedure Laterality Date   BACK SURGERY  2011   rods and screws   CLOSED REDUCTION NASAL FRACTURE Bilateral 06/11/2018   Procedure: CLOSED REDUCTION NASAL FRACTURE;  Surgeon: [name], MD;  Location: [facility];  Service: ENT;  Laterality: Bilateral;   COLONOSCOPY  2015   CORONARY ANGIOPLASTY     2015 stent   CORONARY STENT INTERVENTION N/A 06/08/2019   Procedure: CORONARY STENT INTERVENTION;  Surgeon: Antonette Batters, MD;  Location: [facility];  Service: Cardiovascular;  Laterality: N/A;   CYSTOSCOPY  1984   EYE SURGERY Bilateral 2005,2006,2009   detached retina, cataract   FOOT ARTHRODESIS Left 05/30/2015   Procedure: ARTHRODESIS FOOT STJ LT FOOT;  Surgeon: Anell Baptist, DPM;  Location: [facility];  Service: Podiatry;  Laterality: Left;  WITH POPLITEAL BLOCK   FOOT SURGERY Left    HERNIA REPAIR Right 1969   inguinal   IR GASTROSTOMY TUBE MOD SED  09/22/2023   LEFT HEART CATH AND CORONARY ANGIOGRAPHY Left 06/08/2019   Procedure: LEFT HEART CATH AND CORONARY ANGIOGRAPHY;  Surgeon: Antonette Batters, MD;  Location: [facility];  Service: Cardiovascular;  Laterality: Left;   SEPTOPLASTY Bilateral 06/11/2018   Procedure: SEPTOPLASTY;  Surgeon: [name], MD;  Location: [facility];  Service: ENT;  Laterality: Bilateral;   SHOULDER SURGERY Right 2004   skull surgery     TOE SURGERY Right 2009  TONSILLECTOMY  2008   TRACHEOSTOMY TUBE PLACEMENT N/A 09/23/2023   Procedure: CREATION, TRACHEOSTOMY;  Surgeon: Milon Aloe, MD;  Location: [facility];  Service: ENT;  Laterality: N/A;    Medications Prior to Admission  Medication Sig Dispense Refill Last Dose/Taking   acetaminophen  (TYLENOL ) 500 MG tablet Take 1,000 mg by mouth every 6 (six) hours as needed for mild pain or moderate pain.   Unknown   aspirin  EC 81 MG tablet Take 81 mg by mouth daily.   09/14/2023 at  6:00 AM   B Complex-C (SUPER B COMPLEX PO) Take 1 tablet by mouth daily.    09/14/2023 Morning   Calcium  Carbonate-Vitamin D  600-200 MG-UNIT CAPS Take 1 tablet by mouth 2 (two) times daily.    09/14/2023 Morning   carvedilol  (COREG ) 3.125 MG tablet Take 3.125 mg by mouth daily.    09/14/2023 Morning   [EXPIRED] cefdinir (OMNICEF) 300 MG capsule Take 300 mg by mouth 2 (two) times daily.   09/14/2023 Noon   clindamycin  (CLEOCIN  T) 1 % lotion Apply 1 application topically 2 (two) times daily as needed.    Unknown   clopidogrel  (PLAVIX ) 75 MG tablet Take 75 mg by mouth daily.   09/14/2023 at  6:00 AM   divalproex  (DEPAKOTE  ER) 250 MG 24 hr tablet Total of 750 mg daily. Take along with 500 mg tab 30 tablet 0 09/14/2023 at  8:00 AM   divalproex  (DEPAKOTE  ER) 500 MG 24 hr tablet Take 1 tablet (500 mg total) by mouth daily. 30 tablet 1 09/14/2023 at  8:00 AM   DULoxetine  (CYMBALTA ) 60 MG capsule Take 60 mg by mouth 2 (two) times daily.    09/14/2023 at  3:30 AM   FIBER PO Take 1 tablet by mouth 2 (two) times daily.    09/14/2023 Morning   furosemide  (LASIX ) 20 MG tablet Take 20 mg by mouth daily.   09/14/2023 Morning   latanoprost  (XALATAN ) 0.005 % ophthalmic solution Place 1 drop into the left eye daily.   09/14/2023 Morning   losartan  (COZAAR ) 100 MG tablet Take 100 mg by mouth daily.   09/14/2023 Morning   Melatonin 5 MG TABS Take 10 mg by mouth at bedtime. 30 min before bed   09/13/2023 Bedtime   omeprazole (PRILOSEC) 20 MG capsule Take 20 mg by mouth daily.   09/14/2023 Morning   Probiotic Product (PROBIOTIC DAILY PO) Take 1 tablet by mouth daily. Senior   09/14/2023 Morning   silodosin  (RAPAFLO ) 8 MG CAPS capsule TAKE 1 CAPSULE BY MOUTH DAILY WITH BREAKFAST (Patient taking differently: Take 8 mg by mouth daily.) 90 capsule 3 09/13/2023 at  6:00 PM   sodium chloride  (OCEAN) 0.65 % SOLN nasal spray Place 2 sprays into both nostrils as needed for congestion.   09/14/2023 Morning   tadalafil  (CIALIS ) 5 MG tablet Take 1 tablet by mouth once daily 90 tablet 0 09/14/2023 Morning   SYRINGE-NEEDLE,  DISP, 3 ML (B-D 3CC LUER-LOK SYR 22GX1-1/2) 22G X 1-1/2" 3 ML MISC USE AS DIRECTED WITH TESTOSTERONE  12 each 20    testosterone  cypionate (DEPOTESTOSTERONE CYPIONATE) 200 MG/ML injection Inject 0.3 mLs (60 mg total) into the muscle once a week. DISCARD REMAINING AS THESE ARE SINGLE USE VIALS. (Patient taking differently: Inject 60 mg into the muscle every 14 (fourteen) days. EVERY OTHER SATURDAY. DISCARD REMAINING AS THESE ARE SINGLE USE VIALS.) 5 mL 0    Social History   Socioeconomic History   Marital status: Married    Spouse name:  cindy   Number of children: 1   Years of education: Not on file   Highest education level: Professional school degree (e.g., MD, DDS, DVM, JD)  Occupational History   Not on file  Tobacco Use   Smoking status: Never   Smokeless tobacco: Never  Vaping Use   Vaping status: Never Used  Substance and Sexual Activity   Alcohol use: No   Drug use: No   Sexual activity: Not Currently    Birth control/protection: None  Other Topics Concern   Not on file  Social History Narrative   Not on file   Social Drivers of Health   Financial Resource Strain: Not on file  Food Insecurity: No Food Insecurity (09/15/2023)   Hunger Vital Sign    Worried About Running Out of Food in the Last Year: Never true    Ran Out of Food in the Last Year: Never true  Transportation Needs: No Transportation Needs (09/15/2023)   PRAPARE - Administrator, Civil Service (Medical): No    Lack of Transportation (Non-Medical): No  Physical Activity: Not on file  Stress: Not on file  Social Connections: Patient Unable To Answer (09/15/2023)   Social Connection and Isolation Panel [NHANES]    Frequency of Communication with Friends and Family: Patient unable to answer    Frequency of Social Gatherings with Friends and Family: Patient unable to answer    Attends Religious Services: Patient unable to answer    Active Member of Clubs or Organizations: Patient unable to answer     Attends Banker Meetings: Patient unable to answer    Marital Status: Patient unable to answer  Intimate Partner Violence: Unknown (09/15/2023)   Humiliation, Afraid, Rape, and Kick questionnaire    Fear of Current or Ex-Partner: Patient unable to answer    Emotionally Abused: Patient unable to answer    Physically Abused: Not on file    Sexually Abused: Patient unable to answer    Family History  Problem Relation Age of Onset   Asperger's syndrome Mother       Review of systems complete and found to be negative unless listed above      PHYSICAL EXAM  Trach in place, minimally following commands No significant murmur No pedal edema.  Edema of hands noted Basilar crackles  Labs:   Lab Results  Component Value Date   WBC 20.0 (H) 09/26/2023   HGB 13.3 09/26/2023   HCT 41.7 09/26/2023   MCV 97.2 09/26/2023   PLT 379 09/26/2023    Recent Labs  Lab 09/26/23 0508  NA 144  K 4.5  CL 111  CO2 24  BUN 25*  CREATININE 0.76  CALCIUM  8.6*  GLUCOSE 138*   Lab Results  Component Value Date   CKTOTAL 198 09/17/2023   CKMB 2.3 05/23/2014   TROPONINI <0.03 03/19/2016    Lab Results  Component Value Date   CHOL 112 05/23/2014   CHOL 179 09/13/2013   Lab Results  Component Value Date   HDL 41 05/23/2014   HDL 45 09/13/2013   Lab Results  Component Value Date   LDLCALC 55 05/23/2014   LDLCALC 124 (H) 09/13/2013   Lab Results  Component Value Date   TRIG 150 (H) 09/24/2023   TRIG 190 (H) 09/21/2023   TRIG 146 09/18/2023   No results found for: "CHOLHDL" No results found for: "LDLDIRECT"    Radiology: US  EKG SITE RITE Result Date: 09/24/2023 If Site [facility] not  attached, placement could not be confirmed due to current cardiac rhythm.  IR GASTROSTOMY TUBE MOD SED Result Date: 09/22/2023 INDICATION: 73 year old with respiratory failure and on the ventilator. Request for placement of a gastrostomy tube. EXAM: PERCUTANEOUS GASTROSTOMY TUBE  PLACEMENT WITH FLUOROSCOPIC GUIDANCE Physician: Olive Better. Henn, MD MEDICATIONS: Ancef  2 g; Antibiotics were administered within 1 hour of the procedure. Glucagon  1 mg ANESTHESIA/SEDATION: Patient was intubated and on propofol . Patient was monitored by the radiology nurse throughout the procedure. FLUOROSCOPY: Radiation Exposure Index (as provided by the fluoroscopic device): 43 mGy Kerma COMPLICATIONS: None immediate. PROCEDURE: Informed consent was obtained for a percutaneous gastrostomy tube. The patient was placed on the interventional table. Nasogastric tube was already in place. The anterior abdomen was prepped and draped in sterile fashion. Maximal barrier sterile technique was utilized including caps, mask, sterile gowns, sterile gloves, sterile drape, hand hygiene and skin antiseptic. Stomach was insufflated with air through the nasogastric tube. Anterior abdomen was anesthetized using 1% lidocaine . Using fluoroscopic guidance, 2 Saf-T-Pexy T fasteners were deployed within the stomach. Incision was made between the T-fasteners. Needle was directed into the stomach between the T-fasteners using fluoroscopic guidance and a wire was advanced into the stomach. The tract was dilated to accommodate a 20 French peel-away sheath. An 64 French Entuit gastrostomy tube was advanced over the wire and through the peel-away sheath. Peel-away sheath was removed. The balloon was inflated with 10 mL of sterile water . Gastrostomy tube was injected with contrast to confirm placement in the stomach. Fluoroscopic images were taken and saved for this procedure. FINDINGS: Contrast injection confirms gastrostomy tube in the stomach. IMPRESSION: 1. Successful placement of a percutaneous gastrostomy tube using fluoroscopic guidance. 2. Plan for removal of the T-fasteners in 10-14 days. Electronically Signed   By: Elene Griffes M.D.   On: 09/22/2023 14:13   DG Chest Port 1 View Result Date: 09/20/2023 CLINICAL DATA:  73 year old male  respiratory failure, intubated. EXAM: PORTABLE CHEST 1 VIEW COMPARISON:  Portable chest yesterday and earlier. FINDINGS: Portable AP upright view at 0942 hours. Stable lines and tubes. Left chest pacer/resuscitation pads persist. Stable lung volumes and mediastinal contours. Stable left chest AICD. Stable ventilation since yesterday. No pneumothorax, pulmonary edema. Patchy left greater than right lung base opacity. Paucity of bowel gas in the upper abdomen. Stable visualized osseous structures. IMPRESSION: 1. Stable lines and tubes. 2. Stable ventilation since yesterday. Patchy left greater than right lung base opacity. Electronically Signed   By: Marlise Simpers M.D.   On: 09/20/2023 10:26   DG Chest Port 1 View Result Date: 09/19/2023 CLINICAL DATA:  73 year old male with respiratory failure. EXAM: PORTABLE CHEST 1 VIEW COMPARISON:  Portable AP semi upright view at 0751 hours. FINDINGS: Portable AP semi upright view at 0751 hours. Less rotated to the left now. Pacer and resuscitation pads project over the lower chest. Stable left chest AICD. Endotracheal tube tip remains satisfactory. Stable lines and tubes. Stable low lung volumes, patchy perihilar and lung base opacity. Ventilation is stable with no pneumothorax, pleural effusion identified. And ventilation has improved compared to 09/17/2023. Stable cardiac size and mediastinal contours. Paucity of bowel gas now.  Stable visualized osseous structures. IMPRESSION: 1. Stable lines and tubes. 2. Stable ventilation since yesterday, improved since 09/17/2023. Stable low lung volumes and patchy perihilar and lung base opacity. Electronically Signed   By: Marlise Simpers M.D.   On: 09/19/2023 08:02   DG Abd 1 View Result Date: 09/18/2023 CLINICAL DATA:  Check gastric catheter placement  EXAM: ABDOMEN - 1 VIEW COMPARISON:  09/17/2023 FINDINGS: Gastric catheter is noted coiled within the stomach. No free air is seen. Postsurgical changes in the lower lumbar spine are noted. No  obstructive pattern is seen. IMPRESSION: Gastric catheter in the stomach. Electronically Signed   By: Violeta Grey M.D.   On: 09/18/2023 11:18   DG Chest Port 1 View Result Date: 09/18/2023 CLINICAL DATA:  Shortness of breath and chest pain EXAM: PORTABLE CHEST 1 VIEW COMPARISON:  09/17/2023 FINDINGS: Cardiac shadow is stable. Defibrillator is again noted and stable. Endotracheal 2 and gastric catheter are seen. Lungs are well aerated bilaterally. Improved aeration is noted with decrease in edematous changes. Some basilar opacities are again seen bilaterally. IMPRESSION: Improved aeration when compared with the prior exam. Some persistent basilar opacities remain. Electronically Signed   By: Violeta Grey M.D.   On: 09/18/2023 11:09   DG Abd 1 View Result Date: 09/17/2023 CLINICAL DATA:  OG tube placement. EXAM: ABDOMEN - 1 VIEW COMPARISON:  09/14/2023 FINDINGS: OG tube tip is positioned in the mid stomach with proximal side port below the expected location of the GE junction. Nonspecific bowel gas pattern within the visualized abdomen. IMPRESSION: OG tube tip is positioned in the mid stomach. Electronically Signed   By: Donnal Fusi M.D.   On: 09/17/2023 06:42   DG Chest Port 1 View Result Date: 09/17/2023 CLINICAL DATA:  73 year old male intubated, central line placement, bilateral airspace opacity. EXAM: PORTABLE CHEST 1 VIEW COMPARISON:  Portable chest 09/15/2023 and earlier. FINDINGS: Portable AP supine view at 0610 hours. Endotracheal tube projects over the midline trachea in good position between the clavicles and carina. Enteric tube has been removed. Stable right IJ approach central line. Stable left chest AICD. Similar lung volumes but diffuse bilateral increased pulmonary opacity with combined reticulonodular and vague/airspace appearance. No pneumothorax or pleural effusion. No air bronchograms. Stable cardiac size and mediastinal contours. No acute osseous abnormality identified. Paucity of  bowel gas in the visible abdomen. IMPRESSION: 1. Satisfactory ET tube. Enteric tube removed. Stable right IJ approach central line. 2. Increased bilateral pulmonary opacity from two days ago, differential considerations include progressive pulmonary edema, bilateral infection, ARDS. No pleural effusion is evident. Electronically Signed   By: Marlise Simpers M.D.   On: 09/17/2023 06:36   ECHOCARDIOGRAM COMPLETE Result Date: 09/16/2023    ECHOCARDIOGRAM REPORT   Patient Name:   DR. Hannah Lewis [name] Date of Exam: 09/15/2023 Medical Rec #:  [id number]             Height:       66.0 in Accession #:    [id number]            Weight:       200.6 lb Date of Birth:  Jun 10, 1950             BSA:          2.002 m Patient Age:    73 years              BP:           106/68 mmHg Patient Gender: M                     HR:           77 bpm. Exam Location:  [facility] Procedure: 2D Echo, Cardiac Doppler and Color Doppler (Both Spectral and Color            Flow Doppler  were utilized during procedure). Indications:     Elevated Troponin  History:         Patient has no prior history of Echocardiogram examinations.                  CAD, Stroke, Signs/Symptoms:Dyspnea; Risk Factors:Hypertension                  and Dyslipidemia.  Sonographer:     Brigid Canada RDCS Referring Phys:  [id number] Delanna Fears Diagnosing Phys: Sabina Custovic IMPRESSIONS  1. Left ventricular ejection fraction, by estimation, is 35 to 40%. The left ventricle has moderately decreased function. The left ventricle demonstrates regional wall motion abnormalities (see scoring diagram/findings for description). Left ventricular  diastolic parameters are consistent with Grade I diastolic dysfunction (impaired relaxation).  2. Right ventricular systolic function is normal. The right ventricular size is normal. There is mildly elevated pulmonary artery systolic pressure. The estimated right ventricular systolic pressure is 42.7 mmHg.  3. The mitral valve is normal in  structure. Moderate mitral valve regurgitation. No evidence of mitral stenosis.  4. The aortic valve is normal in structure. Aortic valve regurgitation is not visualized. No aortic stenosis is present.  5. The inferior vena cava is normal in size with greater than 50% respiratory variability, suggesting right atrial pressure of 3 mmHg. FINDINGS  Left Ventricle: Left ventricular ejection fraction, by estimation, is 35 to 40%. The left ventricle has moderately decreased function. The left ventricle demonstrates regional wall motion abnormalities. The left ventricular internal cavity size was normal in size. There is no left ventricular hypertrophy. Left ventricular diastolic parameters are consistent with Grade I diastolic dysfunction (impaired relaxation).  LV Wall Scoring: The antero-lateral wall and apical lateral segment are hypokinetic. Right Ventricle: The right ventricular size is normal. No increase in right ventricular wall thickness. Right ventricular systolic function is normal. There is mildly elevated pulmonary artery systolic pressure. The tricuspid regurgitant velocity is 2.63  m/s, and with an assumed right atrial pressure of 15 mmHg, the estimated right ventricular systolic pressure is 42.7 mmHg. Left Atrium: Left atrial size was normal in size. Right Atrium: Right atrial size was normal in size. Pericardium: There is no evidence of pericardial effusion. Mitral Valve: The mitral valve is normal in structure. Moderate mitral valve regurgitation. No evidence of mitral valve stenosis. Tricuspid Valve: The tricuspid valve is normal in structure. Tricuspid valve regurgitation is mild. The aortic valve is normal in structure. Aortic valve regurgitation is not visualized. No aortic stenosis is present. Pulmonic Valve: The pulmonic valve was normal in structure. Pulmonic valve regurgitation is not visualized. Aorta: The aortic root is normal in size and structure. Venous: The inferior vena cava is normal in  size with greater than 50% respiratory variability, suggesting right atrial pressure of 3 mmHg. IAS/Shunts: No atrial level shunt detected by color flow Doppler.  LEFT VENTRICLE PLAX 2D LVIDd:         5.50 cm   Diastology LVIDs:         4.80 cm   LV e' medial:    6.71 cm/s LV PW:         1.00 cm   LV E/e' medial:  12.9 LV IVS:        1.00 cm   LV e' lateral:   12.83 cm/s LVOT diam:     2.30 cm   LV E/e' lateral: 6.8 LV SV:         63 LV SV Index:  32 LVOT Area:     4.15 cm  RIGHT VENTRICLE             IVC RV Basal diam:  3.70 cm     IVC diam: 2.30 cm RV S prime:     14.50 cm/s TAPSE (M-mode): 2.5 cm LEFT ATRIUM             Index        RIGHT ATRIUM           Index LA diam:        4.70 cm 2.35 cm/m   RA Area:     12.60 cm LA Vol (A2C):   61.3 ml 30.61 ml/m  RA Volume:   31.10 ml  15.53 ml/m LA Vol (A4C):   36.9 ml 18.43 ml/m LA Biplane Vol: 48.5 ml 24.22 ml/m  AORTIC VALVE LVOT Vmax:   82.47 cm/s LVOT Vmean:  54.333 cm/s LVOT VTI:    0.153 m AI PHT:      376 msec  AORTA Ao Root diam: 3.20 cm Ao Asc diam:  3.70 cm MITRAL VALVE               TRICUSPID VALVE MV Area (PHT): 5.14 cm    TR Peak grad:   27.7 mmHg MV Decel Time: 148 msec    TR Vmax:        263.00 cm/s MV E velocity: 86.75 cm/s MV A velocity: 55.30 cm/s  SHUNTS MV E/A ratio:  1.57        Systemic VTI:  0.15 m                            Systemic Diam: 2.30 cm Lanell Pinta Custovic Electronically signed by Isabell Manzanilla Signature Date/Time: 09/16/2023/11:03:59 AM    Final    DG Chest Port 1 View Result Date: 09/15/2023 EXAM: 1 VIEW XRAY OF THE CHEST 09/15/2023 10:35:00 AM COMPARISON: 1 view chest x-ray 05/16/2023 at 2:38 PM. CLINICAL HISTORY: Endotracheally intubated. FINDINGS: LUNGS AND PLEURA: Mild edema and bilateral effusions are similar to the prior study. Bibasilar airspace opacity likely reflects atelectasis. HEART AND MEDIASTINUM: The heart is enlarged. BONES AND SOFT TISSUES: No acute osseous abnormality. LINES AND TUBES: The endotracheal tube  is stable, 3.5 cm above the carina. A right IJ line is stable. IMPRESSION: 1. Stable endotracheal tube and right IJ line. 2. Enlarged heart. 3. Mild edema and bilateral effusions, similar to the prior study. 4. Bibasilar airspace opacity, likely atelectasis. Electronically signed by: Audree Leas MD 09/15/2023 01:26 PM EDT RP Workstation: [id number]   DG Chest Port 1 View Result Date: 09/15/2023 CLINICAL DATA:  Central line placement EXAM: PORTABLE CHEST 1 VIEW COMPARISON:  Radiograph and CT 09/14/2023 FINDINGS: Stable cardiomegaly. Pulmonary vascular congestion and bilateral ground-glass opacities. Small right pleural effusion and right basilar airspace opacities. No pneumothorax. Endotracheal tube tip in the intrathoracic trachea 3.0 cm from the carina. Subdiaphragmatic enteric tube. Right IJ CVC tip in the mid SVC. Left chest wall ICD. IMPRESSION: 1. Right IJ CVC tip in the mid SVC. No pneumothorax. 2. Similar findings of congestive heart failure. Electronically Signed   By: Rozell Cornet M.D.   On: 09/15/2023 02:52   CT Angio Chest PE W and/or Wo Contrast Result Date: 09/14/2023 CLINICAL DATA:  Cold symptoms, chest pain, short of breath, abdominal pain EXAM: CT ANGIOGRAPHY CHEST CT ABDOMEN AND PELVIS WITH CONTRAST TECHNIQUE: Multidetector CT imaging of the chest was performed using  the standard protocol during bolus administration of intravenous contrast. Multiplanar CT image reconstructions and MIPs were obtained to evaluate the vascular anatomy. Multidetector CT imaging of the abdomen and pelvis was performed using the standard protocol during bolus administration of intravenous contrast. RADIATION DOSE REDUCTION: This exam was performed according to the departmental dose-optimization program which includes automated exposure control, adjustment of the mA and/or kV according to patient size and/or use of iterative reconstruction technique. CONTRAST:  OMNIPAQUE  IOHEXOL  350 MG/ML SOLN  COMPARISON:  01/02/2010, 08/23/2013, 09/14/2023 FINDINGS: CTA CHEST FINDINGS Cardiovascular: This is a technically adequate evaluation of the pulmonary vasculature. No filling defects or pulmonary emboli. Mild cardiomegaly with left ventricular dilatation. No pericardial effusion. Dual lead cardiac pacer, proximal lead in the right atrium and distal lead in the right ventricle. 4.1 cm ascending thoracic aortic aneurysm. No evidence of dissection. Atherosclerosis of the aorta and coronary vasculature. Mediastinum/Nodes: Endotracheal tube terminates just above carina. Enteric catheter extends into the gastric lumen. No pathologic adenopathy. Lungs/Pleura: There is dense bilateral perihilar airspace disease, greatest in the lower lobes. Trace bilateral pleural effusions. No pneumothorax. Central airways are patent. Musculoskeletal: No acute or destructive bony abnormalities. Reconstructed images demonstrate no additional findings. Review of the MIP images confirms the above findings. CT ABDOMEN and PELVIS FINDINGS Hepatobiliary: No focal liver abnormality is seen. No gallstones, gallbladder wall thickening, or biliary dilatation. Pancreas: Unremarkable. No pancreatic ductal dilatation or surrounding inflammatory changes. Spleen: Normal in size without focal abnormality. Adrenals/Urinary Tract: Adrenal glands are unremarkable. Kidneys are normal, without renal calculi, focal lesion, or hydronephrosis. The bladder is decompressed with an indwelling Foley catheter. Stomach/Bowel: No bowel obstruction or ileus. Normal appendix right lower quadrant. Scattered colonic diverticulosis without evidence of acute diverticulitis. Enteric catheter tip within the lumen of the gastric body. Vascular/Lymphatic: Aortic atherosclerosis. No enlarged abdominal or pelvic lymph nodes. Reproductive: Stable enlargement of the prostate. Other: No free fluid or free intraperitoneal gas. No abdominal wall hernia. Musculoskeletal: No acute or  destructive bony abnormalities. Postsurgical changes from L2-L4 posterior fusion. Reconstructed images demonstrate no additional findings. Review of the MIP images confirms the above findings. IMPRESSION: Chest: 1. No evidence of pulmonary embolus. 2. Dense bilateral perihilar airspace disease, greatest in the lower lobes, which could reflect widespread infection or edema. 3. Trace bilateral pleural effusions. 4. Aortic Atherosclerosis (ICD10-I70.0). Coronary artery atherosclerosis. 5. 4.1 cm ascending thoracic aortic aneurysm. Recommend annual imaging followup by CTA or MRA. This recommendation follows 2010 ACCF/AHA/AATS/ACR/ASA/SCA/SCAI/SIR/STS/SVM Guidelines for the Diagnosis and Management of Patients with Thoracic Aortic Disease. Circulation. 2010; 121: Z610-R604. Aortic aneurysm NOS (ICD10-I71.9) Abdomen/pelvis: 1. No acute intra-abdominal or intrapelvic process. Normal appendix. 2. Distal colonic diverticulosis without diverticulitis. 3. Enlarged prostate. 4.  Aortic Atherosclerosis (ICD10-I70.0). Electronically Signed   By: Bobbye Burrow M.D.   On: 09/14/2023 22:40   CT ABDOMEN PELVIS W CONTRAST Result Date: 09/14/2023 CLINICAL DATA:  Cold symptoms, chest pain, short of breath, abdominal pain EXAM: CT ANGIOGRAPHY CHEST CT ABDOMEN AND PELVIS WITH CONTRAST TECHNIQUE: Multidetector CT imaging of the chest was performed using the standard protocol during bolus administration of intravenous contrast. Multiplanar CT image reconstructions and MIPs were obtained to evaluate the vascular anatomy. Multidetector CT imaging of the abdomen and pelvis was performed using the standard protocol during bolus administration of intravenous contrast. RADIATION DOSE REDUCTION: This exam was performed according to the departmental dose-optimization program which includes automated exposure control, adjustment of the mA and/or kV according to patient size and/or use of iterative reconstruction technique. CONTRAST:   OMNIPAQUE  IOHEXOL  350 MG/ML SOLN COMPARISON:  01/02/2010, 08/23/2013, 09/14/2023 FINDINGS: CTA CHEST FINDINGS Cardiovascular: This is a technically adequate evaluation of the pulmonary vasculature. No filling defects or pulmonary emboli. Mild cardiomegaly with left ventricular dilatation. No pericardial effusion. Dual lead cardiac pacer, proximal lead in the right atrium and distal lead in the right ventricle. 4.1 cm ascending thoracic aortic aneurysm. No evidence of dissection. Atherosclerosis of the aorta and coronary vasculature. Mediastinum/Nodes: Endotracheal tube terminates just above carina. Enteric catheter extends into the gastric lumen. No pathologic adenopathy. Lungs/Pleura: There is dense bilateral perihilar airspace disease, greatest in the lower lobes. Trace bilateral pleural effusions. No pneumothorax. Central airways are patent. Musculoskeletal: No acute or destructive bony abnormalities. Reconstructed images demonstrate no additional findings. Review of the MIP images confirms the above findings. CT ABDOMEN and PELVIS FINDINGS Hepatobiliary: No focal liver abnormality is seen. No gallstones, gallbladder wall thickening, or biliary dilatation. Pancreas: Unremarkable. No pancreatic ductal dilatation or surrounding inflammatory changes. Spleen: Normal in size without focal abnormality. Adrenals/Urinary Tract: Adrenal glands are unremarkable. Kidneys are normal, without renal calculi, focal lesion, or hydronephrosis. The bladder is decompressed with an indwelling Foley catheter. Stomach/Bowel: No bowel obstruction or ileus. Normal appendix right lower quadrant. Scattered colonic diverticulosis without evidence of acute diverticulitis. Enteric catheter tip within the lumen of the gastric body. Vascular/Lymphatic: Aortic atherosclerosis. No enlarged abdominal or pelvic lymph nodes. Reproductive: Stable enlargement of the prostate. Other: No free fluid or free intraperitoneal gas. No abdominal wall hernia.  Musculoskeletal: No acute or destructive bony abnormalities. Postsurgical changes from L2-L4 posterior fusion. Reconstructed images demonstrate no additional findings. Review of the MIP images confirms the above findings. IMPRESSION: Chest: 1. No evidence of pulmonary embolus. 2. Dense bilateral perihilar airspace disease, greatest in the lower lobes, which could reflect widespread infection or edema. 3. Trace bilateral pleural effusions. 4. Aortic Atherosclerosis (ICD10-I70.0). Coronary artery atherosclerosis. 5. 4.1 cm ascending thoracic aortic aneurysm. Recommend annual imaging followup by CTA or MRA. This recommendation follows 2010 ACCF/AHA/AATS/ACR/ASA/SCA/SCAI/SIR/STS/SVM Guidelines for the Diagnosis and Management of Patients with Thoracic Aortic Disease. Circulation. 2010; 121: [id number]. Aortic aneurysm NOS (ICD10-I71.9) Abdomen/pelvis: 1. No acute intra-abdominal or intrapelvic process. Normal appendix. 2. Distal colonic diverticulosis without diverticulitis. 3. Enlarged prostate. 4.  Aortic Atherosclerosis (ICD10-I70.0). Electronically Signed   By: Bobbye Burrow M.D.   On: 09/14/2023 22:40   CT HEAD WO CONTRAST ( ) Result Date: 09/14/2023 CLINICAL DATA:  Head trauma, minor (Age >= 65y) EXAM: CT HEAD WITHOUT CONTRAST TECHNIQUE: Contiguous axial images were obtained from the base of the skull through the vertex without intravenous contrast. RADIATION DOSE REDUCTION: This exam was performed according to the departmental dose-optimization program which includes automated exposure control, adjustment of the mA and/or kV according to patient size and/or use of iterative reconstruction technique. COMPARISON:  CT head 08/15/2022 FINDINGS: Brain: Bilateral anterior inferior frontal lobe and bilateral anterior temporal lobe encephalomalacia. Left cerebellar encephalomalacia. No evidence of large-territorial acute infarction. No parenchymal hemorrhage. No mass lesion. No extra-axial collection. No mass effect  or midline shift. No hydrocephalus. Basilar cisterns are patent. Vascular: No hyperdense vessel. Atherosclerotic calcifications are present within the cavernous internal carotid and vertebral arteries. Skull: No acute fracture or focal lesion. Old left occipital skull fracture. Sinuses/Orbits: Paranasal sinuses and mastoid air cells are clear. Bilateral lens replacement. Bilateral scleral buckle. Otherwise the orbits are unremarkable. Other: Partially visualized endotracheal tube. IMPRESSION: No acute intracranial abnormality. Electronically Signed   By: Morgane  Naveau M.D.   On: 09/14/2023 22:36  DG Abd Portable 1 View Result Date: 09/14/2023 CLINICAL DATA:  Enteric catheter placement EXAM: PORTABLE ABDOMEN - 1 VIEW COMPARISON:  None Available. FINDINGS: Frontal view of the lower chest and upper abdomen demonstrates enteric catheter passing below diaphragm tip projecting over gastric body. Interstitial and ground-glass opacities throughout the lungs consistent with edema. Unremarkable bowel gas pattern. IMPRESSION: 1. Enteric catheter tip projects over gastric body. Electronically Signed   By: Bobbye Burrow M.D.   On: 09/14/2023 20:55   DG Chest Port 1 View Result Date: 09/14/2023 CLINICAL DATA:  Intubated, CHF, tachypnea EXAM: PORTABLE CHEST 1 VIEW COMPARISON:  09/14/2023 FINDINGS: Single frontal view of the chest demonstrates endotracheal tube overlying tracheal air column, tip of proximally 2 cm above carina. Dual lead pacemaker/AICD unchanged. Cardiac silhouette remains mildly enlarged. Stable interstitial and ground-glass opacities throughout the lungs. No large effusion or pneumothorax. No acute bony abnormalities. IMPRESSION: 1. No complication after intubation. 2. Stable findings of congestive heart failure. Electronically Signed   By: Bobbye Burrow M.D.   On: 09/14/2023 20:55   DG Chest Port 1 View Result Date: 09/14/2023 CLINICAL DATA:  Tachypnea.  CHF EXAM: PORTABLE CHEST 1 VIEW COMPARISON:   X-ray 01/14/2022 and older FINDINGS: Left upper chest battery pack for defibrillator with leads along the right side of the heart. Stable cardiopericardial silhouette. Tortuous aorta. Increasing vascular congestion and component of possible edema. No pneumothorax or effusion. No consolidation. Degenerative changes along the spine. Overlapping cardiac leads. IMPRESSION: Increasing vascular congestion and interstitial changes, possible edema. Defibrillator. Electronically Signed   By: Adrianna Horde M.D.   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 Start p.o. Lasix  20 mg daily Will increase metoprolol  to 50 mg twice daily. Continue losartan  and spironolactone .  Can further uptitrate as blood pressure allows. Continue aspirin , statin Continue amiodarone   Signed: Janette Medley MD 09/26/2023, 11:33 AM

## 2023-09-26 NOTE — Plan of Care (Signed)
   Problem: Clinical Measurements: Goal: Ability to maintain clinical measurements within normal limits will improve Outcome: Progressing Goal: Will remain free from infection Outcome: Progressing Goal: Diagnostic test results will improve Outcome: Progressing Goal: Respiratory complications will improve Outcome: Progressing

## 2023-09-26 NOTE — Progress Notes (Signed)
 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   Per ed reports, and patient's wife who is at the bedside, Patient was seen at the urgent care for chief complaints of chills, body aches, intermittently and nonproductive coughing fits. He was diagnosed with acute upper respiratory infection and was sent home with promethazine-dextromethorphan and cefdinir (OMNICEF). [name] report that he took the prescription and went to bed. Later he woke up altered and delusional and c/o CP and SOB. He was noted to be sweating profusely with abnormal body temp per wife.   ED Course: Initial vital signs showed HR >180 beats/minute, BP >160 mm Hg, the RR 30s-40s breaths/minute, and the oxygen saturation 80s% on NRB and a temperature of 103.53F (39.6C).    Pertinent Labs/Diagnostics Findings: Na+/ K+:138/3.9  Glucose:127 CO2 21 WBC: unremarkable Lactic acid: 1.5~2.6 CK: COVID PCR: Negative troponin:106~7453   BNP:573.5  VBG: pO2 pend; pCO2 37; pH 7.49;  HCO3 28.2, %O2 Sat 43.7.  CXR> CTH> CTA Chest> CT Abd/pelvis>see report   Patient's agitation and restlessness worsen with HR up in the 170s to 180s.  2 mg of Ativan  was trialed but patient did  not calm down. Given worsening symptoms and high risk for decompensation, patient intubated for airway protection. He was started on broad-spectrum antibiotics Ampicillin , vanc, Ceftriaxone  and Acyclovir  due to unclear source for possible infection and cover broadly to include treatment for meningitis. PCCM consulted for admission.  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 and fell off, then he got back onto the horse and fell again on asphalt), HFrEF, ischemic cardiomyopathy, Hyperlipidemia, SVT, CAD, Depression, GERD, Hearing loss of both ears, MI, CVA, Hypertension, Hypotestosteronemia, ICD in place [facility] [employer] D433 DEFIBRILLATOR, RESONATE EL DR DF4 S/N: [phone] implanted 11/21/2021), and OSA on CPAP     Significant Hospital Events: Including procedures, antibiotic start and stop dates in addition to other pertinent events   5/26: Admitted to ICU with acute altered mental status and respiratory failure in the setting of suspected NMS versus sepsis requiring intubation 5/27: fever improved, continued on vasopressors 5/28: extubated in the afternoon. Respiratory failure overnight, re-intubated 5/29: vented, increased oxygen requirements. Family at the bedside. Heart failure consult placed 5/30: vent requirements improved, diuresing, sedated. Reina Cara with SBT 5/31: remains vented, WUA this AM 6/01: vented, sedated, afebrile 6/2 remains on vent 6/3 remains on vent, TRACH PEG TUBE pending 6/4 s/p trach 6/5 tolerated PS 5/5 6/6 tolerated PS 5/5  Interim History / Subjective:  Remains  ill S/p trach S/p PEG  Remains on VENT Needs LTACH wife updated multiple times    Vent Mode: PRVC FiO2 (%):  [30 %] 30 %  Set Rate:  [15 bmp] 15 bmp Vt Set:  [500 mL] 500 mL PEEP:  [5 cmH20] 5 cmH20 Pressure Support:  [5 cmH20] 5  cmH20 Plateau Pressure:  [15 cmH20] 15 cmH20   Objective    Blood pressure 132/82, pulse 95, temperature 99.9 F (37.7 C), temperature source Axillary, resp. rate 18, height 5' 5.98" (1.676 m), weight 83.3 kg, SpO2 94%. CVP:  [4 mmHg-49 mmHg] 10 mmHg  Vent Mode: PRVC FiO2 (%):  [30 %] 30 % Set Rate:  [15 bmp] 15 bmp Vt Set:  [500 mL] 500 mL PEEP:  [5 cmH20] 5 cmH20 Pressure Support:  [5 cmH20] 5 cmH20 Plateau Pressure:  [15 cmH20] 15 cmH20   Intake/Output Summary (Last 24 hours) at 09/26/2023 0709 Last data filed at 09/26/2023 0200 Gross per 24 hour  Intake 850 ml  Output 1230 ml  Net -380 ml   Filed Weights   09/24/23 0446 09/25/23 0435 09/26/23 0500  Weight: 84.2 kg 81.7 kg 83.3 kg       REVIEW OF SYSTEMS  PATIENT IS UNABLE TO PROVIDE COMPLETE REVIEW OF SYSTEMS DUE TO SEVERE CRITICAL ILLNESS   PHYSICAL EXAMINATION:  GENERAL:critically ill appearing, EYES: Pupils equal, round, reactive to light.  No scleral icterus.  MOUTH: Moist mucosal membrane. NECK: Supple. S/p TRACH PULMONARY: Lungs clear to auscultation, +rhonchi, +wheezing CARDIOVASCULAR: S1 and S2.  Regular rate and rhythm GASTROINTESTINAL: Soft, nontender, -distended. Positive bowel sounds. S/p PEG MUSCULOSKELETAL: No swelling, clubbing, or edema.  NEUROLOGIC: lethargic SKIN:normal, warm to touch, Capillary refill delayed  Pulses present bilaterally        Assessment and Plan   73 year old male presenting with a few days' history of shortness of breath, viral symptoms, and encephalopathy. He was intubated on presentation to the ED due to agitation for airway protection and admitted to the ICU for further management, Diagnosis of b/l Pneumonia with failure to wean from ventilator, failed one trial of extubation  Severe ACUTE Hypoxic and Hypercapnic Respiratory Failure -continue Mechanical Ventilator support -Wean Fio2 and PEEP as tolerated -VAP/VENT bundle implementation - Wean PEEP & FiO2 as  tolerated, maintain SpO2 > 88% - Head of bed elevated 30 degrees, VAP protocol in place - Plateau pressures less than 30 cm H20  - Intermittent chest x-ray & ABG PRN - Ensure adequate pulmonary hygiene  -will perform SAT/SBT when respiratory parameters are met 6/4 Trach 6/4 6/5 Tolerated PS 5/5  6/6 Tolerated PS 5/5  SEPTIC/Cardiogenic  shock-resolving SOURCE-Pneumonia/cardiac failure -use vasopressors to keep MAP>65 as needed -follow ABG and LA as needed  NEUROLOGY Fronto-temporal Dementia Started Seroquel  Off all IV sedatives Significant generalized weakness. No focal deficits   INFECTIOUS DISEASE RHINO-Viral pneumonia with superimposed bacterial pneumonia Aspiration Pneumonia  Community Acquired Pneumonia  ACUTE SYSTOLIC CARDIAC FAILURE- EF 35% Ventricular Tachycardia- (5/30) while on SBT for which an amiodarone  gtt was started.  -Lasix  as tolerated -follow up cardiac enzymes as indicated -follow up cardiology recs Type II NSTEMI  CAD s/p PCI to LAD   RENAL -continue Foley Catheter-assess need -Avoid nephrotoxic agents -Follow urine output, BMP -Ensure adequate renal perfusion, optimize oxygenation -Renal dose medications   Intake/Output Summary (Last 24 hours) at 09/26/2023 0711 Last data filed at 09/26/2023 0559 Gross per 24 hour  Intake 850 ml  Output 1535 ml  Net -685 ml     ENDO - ICU hypoglycemic\Hyperglycemia protocol -check FSBS per protocol   GI GI PROPHYLAXIS as indicated NUTRITIONAL STATUS DIET-->TF's as tolerated Constipation protocol as indicated  ELECTROLYTES -follow labs as needed -replace as needed -pharmacy consultation and following  RESTRICTIVE TRANSFUSION PROTOCOL TRANSFUSION  IF HGB<7  or ACTIVE BLEEDING OR DX of ACUTE CORONARY SYNDROMES    Best Practice (right click and "Reselect all SmartList Selections" daily)   Diet/type: tubefeeds DVT prophylaxis LMWH Pressure ulcer(s): N/A GI prophylaxis: PPI Lines: Central  line and Arterial Line Foley:  Yes, and it is still needed Code Status:  full code Last date of multidisciplinary goals of care discussion [09/20/2023]  Labs   CBC: Recent Labs  Lab 09/22/23 0418 09/23/23 0403 09/24/23 0416 09/25/23 0427 09/26/23 0554  WBC 7.9 8.9 11.1* 16.8* 20.0*  HGB 12.3* 13.2 12.1* 13.5 13.3  HCT 38.6* 41.4 36.7* 41.5 41.7  MCV 97.5 97.0 96.3 96.7 97.2  PLT 278 326 306 340 379    Basic Metabolic Panel: Recent Labs  Lab 09/20/23 0437 09/21/23 0458 09/22/23 0418 09/23/23 0403 09/24/23 0416 09/25/23 0427 09/26/23 0508  NA 149* 146* 147* 142 142 147* 144  K 3.3* 3.3* 3.5 4.3 4.2 4.1 4.5  CL 103 98 101 103 106 112* 111  CO2 35* 38* 36* 32 31 30 24   GLUCOSE 164* 159* 104* 108* 143* 146* 138*  BUN 42* 42* 39* 34* 39* 31* 25*  CREATININE 0.89 0.88 0.80 0.87 0.90 0.82 0.76  CALCIUM  7.6* 7.8* 7.9* 8.3* 8.2* 8.3* 8.6*  MG 2.7* 2.7*  --  2.3 2.6*  --   --   PHOS 2.2* 3.4  --  3.5 2.5  --   --    GFR: Estimated Creatinine Clearance: 80.8 mL/min (by C-G formula based on SCr of 0.76 mg/dL). Recent Labs  Lab 09/20/23 0437 09/21/23 0926 09/23/23 0403 09/24/23 0416 09/25/23 0427 09/26/23 0554  PROCALCITON 1.20  --   --   --   --   --   WBC  --    < > 8.9 11.1* 16.8* 20.0*   < > = values in this interval not displayed.    Liver Function Tests: No results for input(s): "AST", "ALT", "ALKPHOS", "BILITOT", "PROT", "ALBUMIN" in the last 168 hours.  No results for input(s): "LIPASE", "AMYLASE" in the last 168 hours. No results for input(s): "AMMONIA" in the last 168 hours.  ABG    Component Value Date/Time   PHART 7.44 09/19/2023 0943   PCO2ART 51 (H) 09/19/2023 0943   PO2ART 84 09/19/2023 0943   HCO3 34.6 (H) 09/19/2023 0943   ACIDBASEDEF 0.2 09/17/2023 0834   O2SAT 72.5 09/26/2023 0500     Coagulation Profile: Recent Labs  Lab 09/21/23 1437  INR 1.1    Cardiac Enzymes: No results for input(s): "CKTOTAL", "CKMB", "CKMBINDEX", "TROPONINI"  in the last 168 hours.   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), Cognitive deficit as late effect of traumatic brain injury (HCC) (03/06/2016), Coronary artery disease, Dementia (HCC), Depression, Disorder of male genital organs (07/11/2013), Dyspnea, Encounter for long-term (current) use of medications (11/21/2014), Erectile dysfunction (07/11/2013), Executive function deficit (03/06/2016), GERD (gastroesophageal reflux disease), Headache, Hearing loss, Hyperlipidemia, Hypertension, Hypogonadism, male (07/11/2013), Hypotestosteronism (07/11/2013),  Injury of frontal lobe (HCC), Major depression, recurrent, chronic (HCC) (03/06/2016), Mild cognitive impairment, Mixed hyperlipidemia (02/02/2014), Myocardial infarction (HCC) (08/2013), OCD (obsessive compulsive disorder), Other retinal detachments (04/20/2015), Other specified disorder of male genital organs(608.89) (07/11/2013), Prostatic hypertrophy, Pseudophakia of both eyes (04/20/2015), Raynaud's disease, Reduced libido (07/11/2013), Repeated falls, Scoliosis, Stroke (HCC) (2/16), and Synovitis.   Surgical History:   Past Surgical History:  Procedure Laterality Date   BACK SURGERY  2011   rods and screws   CLOSED REDUCTION NASAL FRACTURE Bilateral 06/11/2018   Procedure: CLOSED REDUCTION NASAL FRACTURE;  Surgeon: [name], MD;  Location: [facility] OR[facility];  Service: ENT;  Laterality: Bilateral;   COLONOSCOPY  2015   CORONARY ANGIOPLASTY     2015 stent   CORONARY  STENT INTERVENTION N/A 06/08/2019   Procedure: CORONARY STENT INTERVENTION;  Surgeon: Antonette Batters, MD;  Location: [facility] INVASIVE CV [facility];  Service: Cardiovascular;  Laterality: N/A;   CYSTOSCOPY  1984   EYE SURGERY Bilateral 2005,2006,2009   detached retina, cataract   FOOT ARTHRODESIS Left 05/30/2015   Procedure: ARTHRODESIS FOOT STJ LT FOOT;  Surgeon: Anell Baptist, DPM;  Location: [facility];  Service: Podiatry;  Laterality: Left;  WITH POPLITEAL BLOCK   FOOT SURGERY Left    HERNIA REPAIR Right 1969   inguinal   IR GASTROSTOMY TUBE MOD SED  09/22/2023   LEFT HEART CATH AND CORONARY ANGIOGRAPHY Left 06/08/2019   Procedure: LEFT HEART CATH AND CORONARY ANGIOGRAPHY;  Surgeon: Antonette Batters, MD;  Location: [facility] INV[facility] CV [facility];  Service: Cardiovascular;  Laterality: Left;   SEPTOPLASTY Bilateral 06/11/2018   Procedure: SEPTOPLASTY;  Surgeon: [name], MD;  Location: [facility] OR[facility];  Service: ENT;  Laterality: Bilateral;   SHOULDER SURGERY Right 2004   skull surgery     TOE SURGERY Right 2009   TONSILLECTOMY  2008   TRACHEOSTOMY TUBE PLACEMENT N/A 09/23/2023   Procedure: CREATION, TRACHEOSTOMY;  Surgeon: Milon Aloe, MD;  Location: [facility] OR[facility];  Service: ENT;  Laterality: N/A;     Social History:   reports that he has never smoked. He has never used smokeless tobacco. He reports that he does not drink alcohol and does not use drugs.   Family History:  His family history includes Asperger's syndrome in his mother.   Allergies Allergies  Allergen Reactions   Promethazine Other (See Comments)    Severe neuroleptic malignant syndrome requiring intubation   Mirtazapine Other (See Comments)    Other reaction(s): Other (See Comments) Irritability/anger, increased appetite, worsening depression Irritability/anger, increased appetite, worsening depression      Home Medications  Prior to Admission medications   Medication Sig Start Date End Date Taking? Authorizing Provider   acetaminophen  (TYLENOL ) 500 MG tablet Take 1,000 mg by mouth every 6 (six) hours as needed for mild pain or moderate pain.   Yes [provider]  aspirin  EC 81 MG tablet Take 81 mg by mouth daily.   Yes [provider]  B Complex-C (SUPER B COMPLEX PO) Take 1 tablet by mouth daily.   Yes [provider]  Calcium  Carbonate-Vitamin D  600-200 MG-UNIT CAPS Take 1 tablet by mouth 2 (two) times daily.    Yes [provider]  carvedilol  (COREG ) 3.125 MG tablet Take 3.125 mg by mouth daily.  11/03/18  Yes [provider]  cefdinir (OMNICEF) 300 MG capsule Take 300 mg by mouth 2 (two) times daily. 09/14/23 09/21/23 Yes [provider]  clindamycin  (CLEOCIN  T) 1 % lotion Apply 1 application  topically 2 (two) times daily as needed.    Yes [provider]  clopidogrel  (PLAVIX ) 75 MG tablet Take 75 mg by mouth daily. 09/27/18  Yes [provider]  divalproex  (DEPAKOTE  ER) 250 MG 24 hr tablet Total of 750 mg daily. Take along with 500 mg tab 08/24/23  Yes Hisada, Ivan Marion, MD  divalproex  (DEPAKOTE  ER) 500 MG 24 hr tablet Take 1 tablet (500 mg total) by mouth daily. 08/13/23 10/12/23 Yes Todd Fossa, MD  DULoxetine  (CYMBALTA ) 60 MG capsule Take 60 mg by mouth 2 (two) times daily.  11/17/18  Yes [provider]  FIBER PO Take 1 tablet by mouth 2 (two) times daily.    Yes [provider]  furosemide  (LASIX ) 20 MG tablet Take 20 mg by mouth daily. 11/03/18  Yes [provider]  latanoprost  (XALATAN ) 0.005 % ophthalmic solution Place 1 drop into the left eye daily. 11/06/21  Yes [provider]  losartan  (COZAAR ) 100 MG tablet Take 100 mg by mouth daily.   Yes [provider]  Melatonin 5 MG TABS Take 10 mg by mouth at bedtime. 30 min before bed   Yes [provider]  omeprazole (PRILOSEC) 20 MG capsule Take 20 mg by mouth daily.   Yes [provider]  Probiotic Product (PROBIOTIC DAILY PO) Take 1  tablet by mouth daily. Senior   Yes [provider]  silodosin  (RAPAFLO ) 8 MG CAPS capsule TAKE 1 CAPSULE BY MOUTH DAILY WITH BREAKFAST Patient taking differently: Take 8 mg by mouth daily. 03/16/23  Yes Stoioff, Kizzie Perks, MD  sodium chloride  (OCEAN) 0.65 % SOLN nasal spray Place 2 sprays into both nostrils as needed for congestion.   Yes [provider]  tadalafil  (CIALIS ) 5 MG tablet Take 1 tablet by mouth once daily 08/03/23  Yes Stoioff, Scott C, MD  SYRINGE-NEEDLE, DISP, 3 ML (B-D 3CC LUER-LOK SYR 22GX1-1/2) 22G X 1-1/2" 3 ML MISC USE AS DIRECTED WITH TESTOSTERONE  08/24/23   Stoioff, Kizzie Perks, MD  testosterone  cypionate (DEPOTESTOSTERONE CYPIONATE) 200 MG/ML injection Inject 0.3 mLs (60 mg total) into the muscle once a week. DISCARD REMAINING AS THESE ARE SINGLE USE VIALS. Patient taking differently: Inject 60 mg into the muscle every 14 (fourteen) days. EVERY OTHER SATURDAY. DISCARD REMAINING AS THESE ARE SINGLE USE VIALS. 06/16/23   Geraline Knapp, MD         DVT/GI PRX  assessed I Assessed the need for Labs I Assessed the need for Foley I Assessed the need for Central Venous Line Family Discussion when available I Assessed the need for Mobilization I made an Assessment of medications to be adjusted accordingly Safety Risk assessment completed  CASE DISCUSSED IN MULTIDISCIPLINARY ROUNDS WITH ICU TEAM     Critical Care Time devoted to patient care services described in this note is 55 minutes.    Lady Pier, M.D.  [facility]  Medical Director [facility] Medical Director [facility] Cardio-Pulmonary Department

## 2023-09-26 NOTE — Progress Notes (Signed)
 Patient transported from ICU room to CT and from CT back to ICU. No issues with transport.

## 2023-09-26 NOTE — Progress Notes (Addendum)
 Sluggish today. Not as alert or interactive. 1330 Down to CT for follow up CT of head. Cleaned of 2 BMs. 1830 Doppler studies of left arm and CT head results reported to NP [facility].

## 2023-09-27 DIAGNOSIS — J9602 Acute respiratory failure with hypercapnia: Secondary | ICD-10-CM | POA: Diagnosis not present

## 2023-09-27 DIAGNOSIS — J9601 Acute respiratory failure with hypoxia: Secondary | ICD-10-CM | POA: Diagnosis not present

## 2023-09-27 DIAGNOSIS — Z930 Tracheostomy status: Secondary | ICD-10-CM

## 2023-09-27 DIAGNOSIS — A419 Sepsis, unspecified organism: Secondary | ICD-10-CM | POA: Diagnosis not present

## 2023-09-27 DIAGNOSIS — I5021 Acute systolic (congestive) heart failure: Secondary | ICD-10-CM | POA: Diagnosis not present

## 2023-09-27 LAB — CBC
HCT: 40.8 % (ref 39.0–52.0)
Hemoglobin: 13 g/dL (ref 13.0–17.0)
MCH: 31.4 pg (ref 26.0–34.0)
MCHC: 31.9 g/dL (ref 30.0–36.0)
MCV: 98.6 fL (ref 80.0–100.0)
Platelets: 429 10*3/uL — ABNORMAL HIGH (ref 150–400)
RBC: 4.14 MIL/uL — ABNORMAL LOW (ref 4.22–5.81)
RDW: 13.4 % (ref 11.5–15.5)
WBC: 20.3 10*3/uL — ABNORMAL HIGH (ref 4.0–10.5)
nRBC: 0 % (ref 0.0–0.2)

## 2023-09-27 LAB — RENAL FUNCTION PANEL
Albumin: 2.4 g/dL — ABNORMAL LOW (ref 3.5–5.0)
Anion gap: 9 (ref 5–15)
BUN: 30 mg/dL — ABNORMAL HIGH (ref 8–23)
CO2: 24 mmol/L (ref 22–32)
Calcium: 8.6 mg/dL — ABNORMAL LOW (ref 8.9–10.3)
Chloride: 109 mmol/L (ref 98–111)
Creatinine, Ser: 0.83 mg/dL (ref 0.61–1.24)
GFR, Estimated: >60 mL/min (ref >60)
Glucose, Bld: 144 mg/dL — ABNORMAL HIGH (ref 70–99)
Phosphorus: 3.3 mg/dL (ref 2.5–4.6)
Potassium: 4.3 mmol/L (ref 3.5–5.1)
Sodium: 142 mmol/L (ref 135–145)

## 2023-09-27 LAB — MAGNESIUM: Magnesium: 2.1 mg/dL (ref 1.7–2.4)

## 2023-09-27 LAB — GLUCOSE, CAPILLARY
Glucose-Capillary: 134 mg/dL — ABNORMAL HIGH (ref 70–99)
Glucose-Capillary: 136 mg/dL — ABNORMAL HIGH (ref 70–99)
Glucose-Capillary: 143 mg/dL — ABNORMAL HIGH (ref 70–99)
Glucose-Capillary: 149 mg/dL — ABNORMAL HIGH (ref 70–99)
Glucose-Capillary: 156 mg/dL — ABNORMAL HIGH (ref 70–99)
Glucose-Capillary: 157 mg/dL — ABNORMAL HIGH (ref 70–99)
Glucose-Capillary: 168 mg/dL — ABNORMAL HIGH (ref 70–99)

## 2023-09-27 LAB — HEPARIN LEVEL (UNFRACTIONATED): Heparin Unfractionated: 0.18 [IU]/mL — ABNORMAL LOW (ref 0.30–0.70)

## 2023-09-27 MED ORDER — FLUOXETINE HCL 20 MG PO CAPS
20.0000 mg | ORAL_CAPSULE | Freq: Every day | ORAL | Status: DC
  Administered 2023-09-27 – 2023-09-29 (×3): 20 mg
  Filled 2023-09-27 (×4): qty 1

## 2023-09-27 MED ORDER — SPIRONOLACTONE 12.5 MG HALF TABLET
12.5000 mg | ORAL_TABLET | Freq: Once | ORAL | Status: AC
  Administered 2023-09-27: 12.5 mg
  Filled 2023-09-27: qty 1

## 2023-09-27 MED ORDER — SPIRONOLACTONE 25 MG PO TABS
25.0000 mg | ORAL_TABLET | Freq: Every day | ORAL | Status: DC
  Administered 2023-09-28 – 2023-09-29 (×2): 25 mg
  Filled 2023-09-27 (×2): qty 1

## 2023-09-27 MED ORDER — ORAL CARE MOUTH RINSE: 15.0000 mL | OROMUCOSAL | Status: DC | PRN

## 2023-09-27 MED ORDER — OXYCODONE HCL 5 MG PO TABS
5.0000 mg | ORAL_TABLET | Freq: Once | ORAL | Status: AC
  Administered 2023-09-27: 5 mg
  Filled 2023-09-27: qty 1

## 2023-09-27 MED ORDER — LOSARTAN POTASSIUM 50 MG PO TABS
25.0000 mg | ORAL_TABLET | Freq: Once | ORAL | Status: AC
  Administered 2023-09-27: 25 mg
  Filled 2023-09-27: qty 1

## 2023-09-27 MED ORDER — HEPARIN BOLUS VIA INFUSION
2700.0000 [IU] | Freq: Once | INTRAVENOUS | Status: AC
  Administered 2023-09-27: 2700 [IU] via INTRAVENOUS
  Filled 2023-09-27: qty 2700

## 2023-09-27 MED ORDER — HEPARIN (PORCINE) 25000 UT/250ML-% IV SOLN
1900.0000 [IU]/h | INTRAVENOUS | Status: DC
  Administered 2023-09-27: 1300 [IU]/h via INTRAVENOUS
  Administered 2023-09-28: 1900 [IU]/h via INTRAVENOUS
  Administered 2023-09-28: 1600 [IU]/h via INTRAVENOUS
  Administered 2023-09-29 (×2): 1900 [IU]/h via INTRAVENOUS
  Filled 2023-09-27 (×5): qty 250

## 2023-09-27 MED ORDER — LOSARTAN POTASSIUM 50 MG PO TABS
50.0000 mg | ORAL_TABLET | Freq: Every day | ORAL | Status: DC
  Administered 2023-09-28 – 2023-09-29 (×2): 50 mg
  Filled 2023-09-27 (×2): qty 1

## 2023-09-27 NOTE — Consult Note (Signed)
 PHARMACY - ANTICOAGULATION CONSULT NOTE  Pharmacy Consult for heparin  Indication: VTE treatment (left arm thrombus)   Allergies  Allergen Reactions   Promethazine Other (See Comments)    Severe neuroleptic malignant syndrome requiring intubation   Mirtazapine Other (See Comments)    Other reaction(s): Other (See Comments) Irritability/anger, increased appetite, worsening depression Irritability/anger, increased appetite, worsening depression     Patient Measurements: Height: 5\' 6"  (167.6 cm) Weight: 83.9 kg (184 lb 15.5 oz) IBW/kg (Calculated) : 63.8 HEPARIN  DW (KG): 81  Vital Signs: Temp: 99.6 F (37.6 C) (06/08 0800) Temp Source: Axillary (06/08 0800) BP: 166/93 (06/08 1000) Pulse Rate: 88 (06/08 1026)  Labs: Recent Labs    09/25/23 0427 09/26/23 0508 09/26/23 0554 09/27/23 0417  HGB 13.5  --  13.3 13.0  HCT 41.5  --  41.7 40.8  PLT 340  --  379 429*  CREATININE 0.82 0.76  --  0.83    Estimated Creatinine Clearance: 78.1 mL/min (by C-G formula based on SCr of 0.83 mg/dL).   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   Coronary artery disease    Dementia (HCC)    Depression    Disorder of male genital organs 07/11/2013   Dyspnea    Encounter for long-term (current) use of medications 11/21/2014   Erectile dysfunction 07/11/2013   Executive function deficit 03/06/2016   GERD (gastroesophageal reflux disease)    Headache    stress   Hearing loss    Hyperlipidemia    Hypertension    Hypogonadism, 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   Injury of frontal lobe (HCC)    X2  - 15 lesions   Major depression, recurrent, chronic (HCC) 03/06/2016   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   Mixed hyperlipidemia 02/02/2014   Myocardial infarction (HCC) 08/2013   OCD (obsessive compulsive disorder)    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   Other specified disorder of male genital organs(608.89) 07/11/2013   Prostatic hypertrophy    Pseudophakia of both eyes 04/20/2015   Raynaud's disease    Reduced libido 07/11/2013   Repeated falls    weak left ankle   Scoliosis    Stroke (HCC) 2/16   "light"   Synovitis    knees, ankles    Assessment: 73 YO male presented with acute respiratory failure and encephalopathy He was found to have bilateral pneumonia and admitted to the ICU. PMH includes severe TBI, HFrEF, SVT, CAD, MI, CVA, HTN, ICD in place, OSA on CPAP. He was found to have occlusive thrombus throughout his left arm. Hgb stable at 13, plts trending up.   Pertinent medications:  Not on anticoagulation PTA. Started on heparin  5/27 for ACS and stopped after 48 hrs. He was then started on enoxaparin  for DVT ppx, but found to have thrombus in left arm. Now switching to heparin  for VTE treatment.   Received alteplase  injection 2 mg on 6/03.  Goal of Therapy:  Heparin  level 0.3-0.5 units/ml Monitor platelets by anticoagulation protocol: Yes   Plan:  - Per MD no bolus and HL goal reduced to 0.3-0.5 -  Start heparin  infusion @1300  units/hr  - 8 hour heparin  level   - Daily CBC  - Monitory for signs/symptoms of bleeding   Adella Honey, PharmD Candidate  09/27/2023,10:34 AM

## 2023-09-27 NOTE — Progress Notes (Signed)
 Notified Dana, NP of elevated BP throughout day. Also made her aware of pt's pain score throughout day despite scheduled Tylenol  and Oxycodone  and PRN fentanyl . New order placed for additional dose of Oxycodone .

## 2023-09-27 NOTE — Plan of Care (Signed)
  Problem: Clinical Measurements: Goal: Ability to maintain clinical measurements within normal limits will improve Outcome: Progressing Goal: Diagnostic test results will improve Outcome: Progressing Goal: Respiratory complications will improve Outcome: Progressing Goal: Cardiovascular complication will be avoided Outcome: Progressing   Problem: Respiratory: Goal: Ability to maintain a clear airway and adequate ventilation will improve Outcome: Progressing

## 2023-09-27 NOTE — Progress Notes (Addendum)
 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   Per ed reports, and patient's wife who is at the bedside, Patient was seen at the urgent care for chief complaints of chills, body aches, intermittently and nonproductive coughing fits. He was diagnosed with acute upper respiratory infection and was sent home with promethazine-dextromethorphan and cefdinir (OMNICEF). [name] report that he took the prescription and went to bed. Later he woke up altered and delusional and c/o CP and SOB. He was noted to be sweating profusely with abnormal body temp per wife.   ED Course: Initial vital signs showed HR >180 beats/minute, BP >160 mm Hg, the RR 30s-40s breaths/minute, and the oxygen saturation 80s% on NRB and a temperature of 103.5F (39.6C).    Pertinent Labs/Diagnostics Findings: Na+/ K+:138/3.9  Glucose:127 CO2 21 WBC: unremarkable Lactic acid: 1.5~2.6 CK: COVID PCR: Negative troponin:106~7453   BNP:573.5  VBG: pO2 pend; pCO2 37; pH 7.49;  HCO3 28.2, %O2 Sat 43.7.  CXR> CTH> CTA Chest> CT Abd/pelvis>see report   Patient's agitation and restlessness worsen with HR up in the 170s to 180s.  2 mg of Ativan  was trialed but patient did  not calm down. Given worsening symptoms and high risk for decompensation, patient intubated for airway protection. He was started on broad-spectrum antibiotics Ampicillin , vanc, Ceftriaxone  and Acyclovir  due to unclear source for possible infection and cover broadly to include treatment for meningitis. PCCM consulted for admission.  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 and fell off, then he got back onto the horse and fell again on asphalt), HFrEF, ischemic cardiomyopathy, Hyperlipidemia, SVT, CAD, Depression, GERD, Hearing loss of both ears, MI, CVA, Hypertension, Hypotestosteronemia, ICD in place [facility] Scientific [id number] DEFIBRILLATOR, RESONATE EL DR DF4 S/N: [id number] implanted 11/21/2021), and OSA on CPAP     Significant Hospital Events: Including procedures, antibiotic start and stop dates in addition to other pertinent events   5/26: Admitted to ICU with acute altered mental status and respiratory failure in the setting of suspected NMS versus sepsis requiring intubation 5/27: fever improved, continued on vasopressors 5/28: extubated in the afternoon. Respiratory failure overnight, re-intubated 5/29: vented, increased oxygen requirements. Family at the bedside. Heart failure consult placed 5/30: vent requirements improved, diuresing, sedated. Reina Cara with SBT 5/31: remains vented, WUA this AM 6/01: vented, sedated, afebrile 6/2 remains on vent 6/3 remains on vent, TRACH PEG TUBE pending 6/4 s/p trach 6/5 tolerated PS 5/5 6/6 tolerated PS 5/5 6/7 tolerated PS 5/5, US  +left arm Thrombus  Interim History / Subjective:  Remains ill S/p trach S/p PEG  Remains on VENT Needs LTACH wife updated multiple times    Vent Mode: PRVC  FiO2 (%):  [30 %] 30 % Set Rate:  [15 bmp] 15 bmp Vt Set:  [500 mL] 500 mL PEEP:  [5 cmH20] 5  cmH20 Pressure Support:  [5 cmH20] 5 cmH20 Plateau Pressure:  [15 cmH20] 15 cmH20   Objective    Blood pressure (!) 150/83, pulse 90, temperature 99.2 F (37.3 C), temperature source Oral, resp. rate 14, height 5' 5.98" (1.676 m), weight 83.9 kg, SpO2 92%. CVP:  [9 mmHg] 9 mmHg  Vent Mode: PRVC FiO2 (%):  [30 %] 30 % Set Rate:  [15 bmp] 15 bmp Vt Set:  [500 mL] 500 mL PEEP:  [5 cmH20] 5 cmH20 Pressure Support:  [5 cmH20] 5 cmH20 Plateau Pressure:  [15 cmH20] 15 cmH20   Intake/Output Summary (Last 24 hours) at 09/27/2023 0729 Last data filed at 09/27/2023 2130 Gross per 24 hour  Intake 2242 ml  Output 1220 ml  Net 1022 ml   Filed Weights   09/25/23 0435 09/26/23 0500 09/27/23 0434  Weight: 81.7 kg 83.3 kg 83.9 kg       REVIEW OF SYSTEMS  PATIENT IS UNABLE TO PROVIDE COMPLETE REVIEW OF SYSTEMS DUE TO SEVERE CRITICAL ILLNESS   PHYSICAL EXAMINATION:  GENERAL:critically ill appearing, +resp distress EYES: Pupils equal, round, reactive to light.  No scleral icterus.  MOUTH: Moist mucosal membrane. NECK: Supple. S/p trach PULMONARY: Lungs clear to auscultation, +rhonchi,  CARDIOVASCULAR: S1 and S2.  Regular rate and rhythm GASTROINTESTINAL: Soft, nontender, -distended. Positive bowel sounds.  MUSCULOSKELETAL: No swelling, clubbing, or edema.  NEUROLOGIC: lethargic SKIN:normal, warm to touch, Capillary refill delayed  Pulses present bilaterally         Assessment and Plan   73 year old male presenting with a few days' history of shortness of breath, viral symptoms, and encephalopathy. He was intubated on presentation to the ED due to agitation for airway protection and admitted to the ICU for further management, Diagnosis of b/l Pneumonia with failure to wean from ventilator, failed one trial of extubation, leading to TRACH/PEG tube, complicated by left arm Thrombus  Severe ACUTE Hypoxic and Hypercapnic Respiratory Failure -continue Mechanical Ventilator  support -Wean Fio2 and PEEP as tolerated -VAP/VENT bundle implementation - Wean PEEP & FiO2 as tolerated, maintain SpO2 > 88% - Head of bed elevated 30 degrees, VAP protocol in place - Plateau pressures less than 30 cm H20  - Intermittent chest x-ray & ABG PRN - Ensure adequate pulmonary hygiene  -will perform SAT/SBT when respiratory parameters are met 6/4 Trach 6/4 6/5 Tolerated PS 5/5  6/6 Tolerated PS 5/5 6/7 Tolerated PS 5/5  SEPTIC/Cardiogenic  shock-resolving SOURCE-Pneumonia/cardiac failure  NEUROLOGY Fronto-temporal Dementia Started Seroquel  Off all IV sedatives Significant generalized weakness. No focal deficits CT head no acute process  INFECTIOUS DISEASE RHINO-Viral pneumonia with superimposed bacterial pneumonia Aspiration Pneumonia  Community Acquired Pneumonia Continue vent support   ACUTE SYSTOLIC CARDIAC FAILURE- EF 35% Ventricular Tachycardia- (5/30) while on SBT for which an amiodarone  gtt was started.  -Lasix  as tolerated -follow up cardiac enzymes as indicated -follow up cardiology recs Type II NSTEMI  CAD s/p PCI to LAD   RENAL -continue Foley Catheter-assess need -Avoid nephrotoxic agents -Follow urine output, BMP -Ensure adequate renal perfusion, optimize oxygenation -Renal dose medications   Intake/Output Summary (Last 24 hours) at 09/27/2023 0729 Last data filed at 09/27/2023 8657 Gross per 24 hour  Intake 2242 ml  Output 1220 ml  Net 1022 ml      Latest Ref Rng & Units 09/27/2023  4:17 AM 09/26/2023    5:08 AM 09/25/2023    4:27 AM  BMP  Glucose 70 - 99 mg/dL 409  811  914   BUN 8 - 23 mg/dL 30  25  31    Creatinine 0.61 - 1.24 mg/dL 7.82  9.56  2.13   Sodium 135 - 145 mmol/L 142  144  147   Potassium 3.5 - 5.1 mmol/L 4.3  4.5  4.1   Chloride 98 - 111 mmol/L 109  111  112   CO2 22 - 32 mmol/L 24  24  30    Calcium  8.9 - 10.3 mg/dL 8.6  8.6  8.3    LEFT ARM THROMBUS START HEPARIN  INFUSION  ENDO - ICU hypoglycemic\Hyperglycemia  protocol -check FSBS per protocol   GI GI PROPHYLAXIS as indicated NUTRITIONAL STATUS DIET-->TF's as tolerated Constipation protocol as indicated   ELECTROLYTES -follow labs as needed -replace as needed -pharmacy consultation and following  RESTRICTIVE TRANSFUSION PROTOCOL TRANSFUSION  IF HGB<7  or ACTIVE BLEEDING OR DX of ACUTE CORONARY SYNDROMES  RECOMMEND LTACH   Best Practice (right click and "Reselect all SmartList Selections" daily)   Diet/type: tubefeeds DVT prophylaxis LMWH Pressure ulcer(s): N/A GI prophylaxis: PPI Lines: Central line and Arterial Line Foley:  Yes, and it is still needed Code Status:  full code Last date of multidisciplinary goals of care discussion [09/20/2023]  Labs   CBC: Recent Labs  Lab 09/23/23 0403 09/24/23 0416 09/25/23 0427 09/26/23 0554 09/27/23 0417  WBC 8.9 11.1* 16.8* 20.0* 20.3*  HGB 13.2 12.1* 13.5 13.3 13.0  HCT 41.4 36.7* 41.5 41.7 40.8  MCV 97.0 96.3 96.7 97.2 98.6  PLT 326 306 340 379 429*    Basic Metabolic Panel: Recent Labs  Lab 09/21/23 0458 09/22/23 0418 09/23/23 0403 09/24/23 0416 09/25/23 0427 09/26/23 0508 09/27/23 0417  NA 146*   < > 142 142 147* 144 142  K 3.3*   < > 4.3 4.2 4.1 4.5 4.3  CL 98   < > 103 106 112* 111 109  CO2 38*   < > 32 31 30 24 24   GLUCOSE 159*   < > 108* 143* 146* 138* 144*  BUN 42*   < > 34* 39* 31* 25* 30*  CREATININE 0.88   < > 0.87 0.90 0.82 0.76 0.83  CALCIUM  7.8*   < > 8.3* 8.2* 8.3* 8.6* 8.6*  MG 2.7*  --  2.3 2.6*  --   --  2.1  PHOS 3.4  --  3.5 2.5  --   --  3.3   < > = values in this interval not displayed.   GFR: Estimated Creatinine Clearance: 78.1 mL/min (by C-G formula based on SCr of 0.83 mg/dL). Recent Labs  Lab 09/24/23 0416 09/25/23 0427 09/26/23 0554 09/27/23 0417  WBC 11.1* 16.8* 20.0* 20.3*    Liver Function Tests: Recent Labs  Lab 09/27/23 0417  ALBUMIN 2.4*    No results for input(s): "LIPASE", "AMYLASE" in the last 168 hours. No  results for input(s): "AMMONIA" in the last 168 hours.  ABG    Component Value Date/Time   PHART 7.44 09/19/2023 0943   PCO2ART 51 (H) 09/19/2023 0943   PO2ART 84 09/19/2023 0943   HCO3 34.6 (H) 09/19/2023 0943   ACIDBASEDEF 0.2 09/17/2023 0834   O2SAT 72.5 09/26/2023 0500     Coagulation Profile: Recent Labs  Lab 09/21/23 1437  INR 1.1    Cardiac Enzymes: No results for input(s): "CKTOTAL", "CKMB", "CKMBINDEX", "TROPONINI" in  the last 168 hours.   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), Cognitive deficit as late effect of traumatic brain injury (HCC) (03/06/2016), Coronary artery disease, Dementia (HCC), Depression, Disorder of male genital organs (07/11/2013), Dyspnea, Encounter for long-term (current) use of medications (11/21/2014), Erectile dysfunction (07/11/2013), Executive function deficit (03/06/2016), GERD (gastroesophageal reflux disease), Headache, Hearing loss, Hyperlipidemia, Hypertension, Hypogonadism, male (07/11/2013), Hypotestosteronism (07/11/2013), Injury of frontal lobe (HCC), Major depression, recurrent, chronic (HCC) (03/06/2016), Mild cognitive impairment, Mixed hyperlipidemia (02/02/2014), Myocardial infarction (HCC) (08/2013), OCD (obsessive compulsive disorder), Other retinal detachments (04/20/2015), Other specified disorder of male genital organs(608.89) (07/11/2013), Prostatic hypertrophy, Pseudophakia of both eyes (04/20/2015), Raynaud's disease, Reduced libido  (07/11/2013), Repeated falls, Scoliosis, Stroke (HCC) (2/16), and Synovitis.   Surgical History:   Past Surgical History:  Procedure Laterality Date   BACK SURGERY  2011   rods and screws   CLOSED REDUCTION NASAL FRACTURE Bilateral 06/11/2018   Procedure: CLOSED REDUCTION NASAL FRACTURE;  Surgeon: [name], MD;  Location: [facility] ORS;  Service: ENT;  Laterality: Bilateral;   COLONOSCOPY  2015   CORONARY ANGIOPLASTY     2015 stent   CORONARY STENT INTERVENTION N/A 06/08/2019   Procedure: CORONARY STENT INTERVENTION;  Surgeon: Antonette Batters, MD;  Location: [facility] INVASIVE CV [facility];  Service: Cardiovascular;  Laterality: N/A;   CYSTOSCOPY  1984   EYE SURGERY Bilateral 2005,2006,2009   detached retina, cataract   FOOT ARTHRODESIS Left 05/30/2015   Procedure: ARTHRODESIS FOOT STJ LT FOOT;  Surgeon: Anell Baptist, DPM;  Location: [facility];  Service: Podiatry;  Laterality: Left;  WITH POPLITEAL BLOCK   FOOT SURGERY Left    HERNIA REPAIR Right 1969   inguinal   IR GASTROSTOMY TUBE MOD SED  09/22/2023   LEFT HEART CATH AND CORONARY ANGIOGRAPHY Left 06/08/2019   Procedure: LEFT HEART CATH AND CORONARY ANGIOGRAPHY;  Surgeon: Antonette Batters, MD;  Location: [facility] INVASIVE CV LAB;  Service: Cardiovascular;  Laterality: Left;   SEPTOPLASTY Bilateral 06/11/2018   Procedure: SEPTOPLASTY;  Surgeon: [name], MD;  Location: [facility] ORS;  Service: ENT;  Laterality: Bilateral;   SHOULDER SURGERY Right 2004   skull surgery     TOE SURGERY Right 2009   TONSILLECTOMY  2008   TRACHEOSTOMY TUBE PLACEMENT N/A 09/23/2023   Procedure: CREATION, TRACHEOSTOMY;  Surgeon: Milon Aloe, MD;  Location: [facility] ORS;  Service: ENT;  Laterality: N/A;     Social History:   reports that he has never smoked. He has never used smokeless tobacco. He reports that he does not drink alcohol and does not use drugs.   Family History:  His family history includes Asperger's syndrome in his mother.    Allergies Allergies  Allergen Reactions   Promethazine Other (See Comments)    Severe neuroleptic malignant syndrome requiring intubation   Mirtazapine Other (See Comments)    Other reaction(s): Other (See Comments) Irritability/anger, increased appetite, worsening depression Irritability/anger, increased appetite, worsening depression      Home Medications  Prior to Admission medications   Medication Sig Start Date End Date Taking?  Authorizing Provider  acetaminophen  (TYLENOL ) 500 MG tablet Take 1,000 mg by mouth every 6 (six) hours as needed for mild pain or moderate pain.   Yes [provider]  aspirin  EC 81 MG tablet Take 81 mg by mouth daily.   Yes [provider]  B Complex-C (SUPER B COMPLEX PO) Take 1 tablet by mouth daily.   Yes [provider]  Calcium  Carbonate-Vitamin D  600-200 MG-UNIT CAPS Take 1 tablet by mouth 2 (two) times daily.    Yes [provider]  carvedilol  (COREG ) 3.125 MG tablet Take 3.125 mg by mouth daily.  11/03/18  Yes [provider]  cefdinir (OMNICEF) 300 MG capsule Take 300 mg by mouth 2 (two) times daily. 09/14/23 09/21/23 Yes [provider]  clindamycin  (CLEOCIN  T) 1 % lotion Apply 1 application topically 2 (two) times daily as needed.    Yes [provider]  clopidogrel  (PLAVIX ) 75 MG tablet Take 75 mg by mouth daily. 09/27/18  Yes [provider]  divalproex  (DEPAKOTE  ER) 250 MG 24 hr tablet Total of 750 mg daily. Take along with 500 mg tab 08/24/23  Yes Hisada, Ivan Marion, MD  divalproex  (DEPAKOTE  ER) 500 MG 24 hr tablet Take 1 tablet (500 mg total) by mouth daily. 08/13/23 10/12/23 Yes Todd Fossa, MD  DULoxetine  (CYMBALTA ) 60 MG capsule Take 60 mg by mouth 2 (two) times daily.  11/17/18  Yes [provider]  FIBER PO Take 1 tablet by mouth 2 (two) times daily.    Yes [provider]  furosemide  (LASIX ) 20 MG tablet Take 20 mg by mouth daily. 11/03/18  Yes [provider]  latanoprost  (XALATAN ) 0.005 % ophthalmic solution Place 1 drop into the left eye daily. 11/06/21  Yes [provider]  losartan  (COZAAR ) 100 MG tablet Take 100 mg by mouth daily.   Yes [provider]  Melatonin 5 MG TABS Take 10 mg by mouth at bedtime. 30 min before bed   Yes [provider]  omeprazole (PRILOSEC) 20 MG capsule Take 20 mg by mouth daily.   Yes [provider]  Probiotic Product (PROBIOTIC DAILY PO) Take 1 tablet by mouth daily. Senior   Yes [provider]  silodosin  (RAPAFLO ) 8 MG CAPS capsule TAKE 1 CAPSULE BY MOUTH DAILY WITH BREAKFAST Patient taking differently: Take 8 mg by mouth daily. 03/16/23  Yes Stoioff, Kizzie Perks, MD  sodium chloride  (OCEAN) 0.65 % SOLN nasal spray Place 2 sprays into both nostrils as needed for congestion.   Yes [provider]  tadalafil  (CIALIS ) 5 MG tablet Take 1 tablet by mouth once daily 08/03/23  Yes Stoioff, Scott C, MD  SYRINGE-NEEDLE, DISP, 3 ML (B-D 3CC LUER-LOK SYR 22GX1-1/2) 22G X 1-1/2" 3 ML MISC USE AS DIRECTED WITH TESTOSTERONE  08/24/23   Stoioff, Kizzie Perks, MD  testosterone  cypionate (DEPOTESTOSTERONE CYPIONATE) 200 MG/ML injection Inject 0.3 mLs (60 mg total) into the muscle once a week. DISCARD REMAINING AS THESE ARE SINGLE USE VIALS. Patient taking differently: Inject 60 mg into the muscle every 14 (fourteen) days. EVERY OTHER SATURDAY. DISCARD REMAINING AS THESE ARE SINGLE USE VIALS. 06/16/23   Geraline Knapp, MD        DVT/GI PRX  assessed I Assessed the need for Labs I Assessed the need for Foley I Assessed the need for Central Venous Line Family Discussion when available I Assessed the need for Mobilization I made an Assessment of medications to be adjusted accordingly Safety Risk assessment completed  CASE DISCUSSED IN MULTIDISCIPLINARY ROUNDS WITH ICU TEAM   Critical Care Time devoted to patient care services described in this note is 55 minutes.    Lady Pier, M.D.  [facility]  Medical Director [facility] Medical Director [facility] Cardio-Pulmonary Department

## 2023-09-27 NOTE — Progress Notes (Signed)
 [facility] Cardiology  CARDIOLOGY PROGRESS NOTE  Patient ID: ROBERTSON COLCLOUGH III MRN: [id number] DOB/AGE: [birth year] 75 y.o.  Admit date: 09/14/2023 Referring Physician Dr. Auston Left Reason for Consultation NSTEMI, heart failure  HPI: Patient examined at bedside.  Trach in place, minimally responsive.  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   Chronic left shoulder pain 04/03/2016   Chronic prostatitis 07/11/2013   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   Coronary artery disease    Dementia (HCC)    Depression    Disorder of male genital organs 07/11/2013   Dyspnea    Encounter for long-term (current) use of medications 11/21/2014   Erectile dysfunction 07/11/2013   Executive function deficit 03/06/2016   GERD (gastroesophageal reflux disease)    Headache    stress   Hearing loss    Hyperlipidemia    Hypertension    Hypogonadism, 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   Major depression, recurrent, chronic (HCC) 03/06/2016   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   Mixed hyperlipidemia 02/02/2014   Myocardial infarction (HCC) 08/2013   OCD (obsessive compulsive disorder)    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   Other specified disorder of male genital organs(608.89) 07/11/2013   Prostatic hypertrophy    Pseudophakia of both eyes 04/20/2015   Raynaud's disease    Reduced libido 07/11/2013   Repeated falls    weak left ankle   Scoliosis    Stroke (HCC) 2/16   "light"   Synovitis    knees, ankles    Past Surgical History:  Procedure  Laterality Date   BACK SURGERY  2011   rods and screws   CLOSED REDUCTION NASAL FRACTURE Bilateral 06/11/2018   Procedure: CLOSED REDUCTION NASAL FRACTURE;  Surgeon: [name], MD;  Location: [facility] ORS;  Service: ENT;  Laterality: Bilateral;   COLONOSCOPY  2015   CORONARY ANGIOPLASTY     2015 stent   CORONARY STENT INTERVENTION N/A 06/08/2019   Procedure: CORONARY STENT INTERVENTION;  Surgeon: Antonette Batters, MD;  Location: [facility] INVASIVE CV LAB;  Service: Cardiovascular;  Laterality: N/A;   CYSTOSCOPY  1984   EYE SURGERY Bilateral 2005,2006,2009   detached retina, cataract   FOOT ARTHRODESIS Left 05/30/2015   Procedure: ARTHRODESIS FOOT STJ LT FOOT;  Surgeon: Anell Baptist, DPM;  Location: [facility];  Service: Podiatry;  Laterality: Left;  WITH POPLITEAL BLOCK   FOOT SURGERY Left    HERNIA REPAIR Right 1969   inguinal   IR GASTROSTOMY TUBE MOD SED  09/22/2023   LEFT HEART CATH AND CORONARY ANGIOGRAPHY Left 06/08/2019   Procedure: LEFT HEART CATH AND CORONARY ANGIOGRAPHY;  Surgeon: Antonette Batters, MD;  Location: [facility] INVASIVE CV LAB;  Service: Cardiovascular;  Laterality: Left;   SEPTOPLASTY Bilateral 06/11/2018   Procedure: SEPTOPLASTY;  Surgeon: [name], MD;  Location: [facility] ORS;  Service: ENT;  Laterality: Bilateral;   SHOULDER SURGERY Right 2004   skull surgery     TOE SURGERY Right 2009   TONSILLECTOMY  2008  TRACHEOSTOMY TUBE PLACEMENT N/A 09/23/2023   Procedure: CREATION, TRACHEOSTOMY;  Surgeon: Milon Aloe, MD;  Location: [facility] ORS;  Service: ENT;  Laterality: N/A;    Medications Prior to Admission  Medication Sig Dispense Refill Last Dose/Taking   acetaminophen  (TYLENOL ) 500 MG tablet Take 1,000 mg by mouth every 6 (six) hours as needed for mild pain or moderate pain.   Unknown   aspirin  EC 81 MG tablet Take 81 mg by mouth daily.   09/14/2023 at  6:00 AM   B Complex-C (SUPER B COMPLEX PO) Take 1 tablet by mouth daily.   09/14/2023 Morning   Calcium   Carbonate-Vitamin D  600-200 MG-UNIT CAPS Take 1 tablet by mouth 2 (two) times daily.    09/14/2023 Morning   carvedilol  (COREG ) 3.125 MG tablet Take 3.125 mg by mouth daily.    09/14/2023 Morning   [EXPIRED] cefdinir (OMNICEF) 300 MG capsule Take 300 mg by mouth 2 (two) times daily.   09/14/2023 Noon   clindamycin  (CLEOCIN  T) 1 % lotion Apply 1 application topically 2 (two) times daily as needed.    Unknown   clopidogrel  (PLAVIX ) 75 MG tablet Take 75 mg by mouth daily.   09/14/2023 at  6:00 AM   divalproex  (DEPAKOTE  ER) 250 MG 24 hr tablet Total of 750 mg daily. Take along with 500 mg tab 30 tablet 0 09/14/2023 at  8:00 AM   divalproex  (DEPAKOTE  ER) 500 MG 24 hr tablet Take 1 tablet (500 mg total) by mouth daily. 30 tablet 1 09/14/2023 at  8:00 AM   DULoxetine  (CYMBALTA ) 60 MG capsule Take 60 mg by mouth 2 (two) times daily.    09/14/2023 at  3:30 AM   FIBER PO Take 1 tablet by mouth 2 (two) times daily.    09/14/2023 Morning   furosemide  (LASIX ) 20 MG tablet Take 20 mg by mouth daily.   09/14/2023 Morning   latanoprost  (XALATAN ) 0.005 % ophthalmic solution Place 1 drop into the left eye daily.   09/14/2023 Morning   losartan  (COZAAR ) 100 MG tablet Take 100 mg by mouth daily.   09/14/2023 Morning   Melatonin 5 MG TABS Take 10 mg by mouth at bedtime. 30 min before bed   09/13/2023 Bedtime   omeprazole (PRILOSEC) 20 MG capsule Take 20 mg by mouth daily.   09/14/2023 Morning   Probiotic Product (PROBIOTIC DAILY PO) Take 1 tablet by mouth daily. Senior   09/14/2023 Morning   silodosin  (RAPAFLO ) 8 MG CAPS capsule TAKE 1 CAPSULE BY MOUTH DAILY WITH BREAKFAST (Patient taking differently: Take 8 mg by mouth daily.) 90 capsule 3 09/13/2023 at  6:00 PM   sodium chloride  (OCEAN) 0.65 % SOLN nasal spray Place 2 sprays into both nostrils as needed for congestion.   09/14/2023 Morning   tadalafil  (CIALIS ) 5 MG tablet Take 1 tablet by mouth once daily 90 tablet 0 09/14/2023 Morning   SYRINGE-NEEDLE, DISP, 3 ML (B-D 3CC LUER-LOK  SYR 22GX1-1/2) 22G X 1-1/2" 3 ML MISC USE AS DIRECTED WITH TESTOSTERONE  12 each 20    testosterone  cypionate (DEPOTESTOSTERONE CYPIONATE) 200 MG/ML injection Inject 0.3 mLs (60 mg total) into the muscle once a week. DISCARD REMAINING AS THESE ARE SINGLE USE VIALS. (Patient taking differently: Inject 60 mg into the muscle every 14 (fourteen) days. EVERY OTHER SATURDAY. DISCARD REMAINING AS THESE ARE SINGLE USE VIALS.) 5 mL 0    Social History   Socioeconomic History   Marital status: Married    Spouse name: cindy   Number of  children: 1   Years of education: Not on file   Highest education level: Professional school degree (e.g., MD, DDS, DVM, JD)  Occupational History   Not on file  Tobacco Use   Smoking status: Never   Smokeless tobacco: Never  Vaping Use   Vaping status: Never Used  Substance and Sexual Activity   Alcohol use: No   Drug use: No   Sexual activity: Not Currently    Birth control/protection: None  Other Topics Concern   Not on file  Social History Narrative   Not on file   Social Drivers of Health   Financial Resource Strain: Not on file  Food Insecurity: No Food Insecurity (09/15/2023)   Hunger Vital Sign    Worried About Running Out of Food in the Last Year: Never true    Ran Out of Food in the Last Year: Never true  Transportation Needs: No Transportation Needs (09/15/2023)   PRAPARE - Administrator, Civil Service (Medical): No    Lack of Transportation (Non-Medical): No  Physical Activity: Not on file  Stress: Not on file  Social Connections: Patient Unable To Answer (09/15/2023)   Social Connection and Isolation Panel [NHANES]    Frequency of Communication with Friends and Family: Patient unable to answer    Frequency of Social Gatherings with Friends and Family: Patient unable to answer    Attends Religious Services: Patient unable to answer    Active Member of Clubs or Organizations: Patient unable to answer    Attends Tax inspector Meetings: Patient unable to answer    Marital Status: Patient unable to answer  Intimate Partner Violence: Unknown (09/15/2023)   Humiliation, Afraid, Rape, and Kick questionnaire    Fear of Current or Ex-Partner: Patient unable to answer    Emotionally Abused: Patient unable to answer    Physically Abused: Not on file    Sexually Abused: Patient unable to answer    Family History  Problem Relation Age of Onset   Asperger's syndrome Mother       Review of systems complete and found to be negative unless listed above      PHYSICAL EXAM  Trach in place, minimally following commands No significant murmur No pedal edema.  Edema of hands noted Basilar crackles  Labs:   Lab Results  Component Value Date   WBC 20.3 (H) 09/27/2023   HGB 13.0 09/27/2023   HCT 40.8 09/27/2023   MCV 98.6 09/27/2023   PLT 429 (H) 09/27/2023    Recent Labs  Lab 09/27/23 0417  NA 142  K 4.3  CL 109  CO2 24  BUN 30*  CREATININE 0.83  CALCIUM  8.6*  GLUCOSE 144*   Lab Results  Component Value Date   CKTOTAL 198 09/17/2023   CKMB 2.3 05/23/2014   TROPONINI <0.03 03/19/2016    Lab Results  Component Value Date   CHOL 112 05/23/2014   CHOL 179 09/13/2013   Lab Results  Component Value Date   HDL 41 05/23/2014   HDL 45 09/13/2013   Lab Results  Component Value Date   LDLCALC 55 05/23/2014   LDLCALC 124 (H) 09/13/2013   Lab Results  Component Value Date   TRIG 150 (H) 09/24/2023   TRIG 190 (H) 09/21/2023   TRIG 146 09/18/2023   No results found for: "CHOLHDL" No results found for: "LDLDIRECT"    Radiology: US  Venous Img Upper Uni Left (DVT) Result Date: 09/26/2023 CLINICAL DATA:  Left arm and  hand edema EXAM: LEFT UPPER EXTREMITY VENOUS DOPPLER ULTRASOUND TECHNIQUE: Gray-scale sonography with graded compression, as well as color Doppler and duplex ultrasound were performed to evaluate the upper extremity deep venous system from the level of the subclavian vein and  including the jugular, axillary, basilic, radial, ulnar and upper cephalic vein. Spectral Doppler was utilized to evaluate flow at rest and with distal augmentation maneuvers. COMPARISON:  None Available. FINDINGS: Contralateral Subclavian Vein: Respiratory phasicity is normal and symmetric with the symptomatic side. No evidence of thrombus. Normal compressibility. Internal Jugular Vein: No evidence of thrombus. Normal compressibility, respiratory phasicity and response to augmentation. Subclavian Vein: No evidence of thrombus. Normal compressibility, respiratory phasicity and response to augmentation. Axillary Vein: Occlusive thrombus is present. Cephalic Vein: No evidence of thrombus. Normal compressibility, respiratory phasicity and response to augmentation. Basilic Vein: Occlusive thrombus is present throughout the basilic vein from the axilla to the antecubital fossa. Brachial Veins: Occlusive thrombus is present throughout both brachial veins from the axilla to the antecubital fossa. Radial Veins: No evidence of thrombus. Normal compressibility, respiratory phasicity and response to augmentation. Ulnar Veins: No evidence of thrombus. Normal compressibility, respiratory phasicity and response to augmentation. Venous Reflux:  None visualized. Other Findings:  None visualized. IMPRESSION: Occlusive thrombus is present throughout the left axillary, basilic, and brachial veins from the axilla to the antecubital fossa. These results will be called to the ordering clinician or representative by the Radiologist Assistant, and communication documented in the PACS or [employer]. Electronically Signed   By: Rozell Cornet M.D.   On: 09/26/2023 19:22   CT HEAD WO CONTRAST ( ) Result Date: 09/26/2023 EXAM: CT HEAD WITHOUT CONTRAST 09/26/2023 01:33:00 PM TECHNIQUE: CT of the head was performed without the administration of intravenous contrast. Automated exposure control, iterative reconstruction, and/or weight  based adjustment of the mA/kV was utilized to reduce the radiation dose to as low as reasonably achievable. COMPARISON: CT head without contrast 09/14/2023. CT head without contrast 08/15/2022. MR head without contrast 05/23/2014. CLINICAL HISTORY: Neuro deficit, acute, stroke suspected. FINDINGS: BRAIN AND VENTRICLES: There is no acute intracranial hemorrhage, mass effect or midline shift. No abnormal extra-axial fluid collection. The gray-white differentiation is maintained without an acute infarct. There is no hydrocephalus. Chronic encephalomalacia of the anterior frontal and temporal lobes are stable. ORBITS: Bilateral lens replacements and scleral banding is present. SINUSES: Fluid is present in the maxillary sinuses, right greater than left, and in the right sphenoid sinus. The mastoid air cells are clear. SOFT TISSUES AND SKULL: No acute abnormality of the visualized skull or soft tissues. IMPRESSION: 1. No acute intracranial abnormality. 2. Stable chronic encephalomalacia of the anterior frontal and temporal lobes. 3. Acute sinusitis of the maxillary sinuses, right greater than left, and the right sphenoid sinus. Electronically signed by: Audree Leas MD 09/26/2023 05:43 PM EDT RP Workstation: [id number]   US  EKG SITE RITE Result Date: 09/24/2023 If Site Rite image not attached, placement could not be confirmed due to current cardiac rhythm.  IR GASTROSTOMY TUBE MOD SED Result Date: 09/22/2023 INDICATION: 73 year old with respiratory failure and on the ventilator. Request for placement of a gastrostomy tube. EXAM: PERCUTANEOUS GASTROSTOMY TUBE PLACEMENT WITH FLUOROSCOPIC GUIDANCE Physician: Olive Better. Henn, MD MEDICATIONS: Ancef  2 g; Antibiotics were administered within 1 hour of the procedure. Glucagon  1 mg ANESTHESIA/SEDATION: Patient was intubated and on propofol . Patient was monitored by the radiology nurse throughout the procedure. FLUOROSCOPY: Radiation Exposure Index (as provided by the  fluoroscopic device): 43 mGy Kerma COMPLICATIONS: None immediate.  PROCEDURE: Informed consent was obtained for a percutaneous gastrostomy tube. The patient was placed on the interventional table. Nasogastric tube was already in place. The anterior abdomen was prepped and draped in sterile fashion. Maximal barrier sterile technique was utilized including caps, mask, sterile gowns, sterile gloves, sterile drape, hand hygiene and skin antiseptic. Stomach was insufflated with air through the nasogastric tube. Anterior abdomen was anesthetized using 1% lidocaine . Using fluoroscopic guidance, 2 Saf-T-Pexy T fasteners were deployed within the stomach. Incision was made between the T-fasteners. Needle was directed into the stomach between the T-fasteners using fluoroscopic guidance and a wire was advanced into the stomach. The tract was dilated to accommodate a 20 French peel-away sheath. An 74 French Entuit gastrostomy tube was advanced over the wire and through the peel-away sheath. Peel-away sheath was removed. The balloon was inflated with 10 mL of sterile water . Gastrostomy tube was injected with contrast to confirm placement in the stomach. Fluoroscopic images were taken and saved for this procedure. FINDINGS: Contrast injection confirms gastrostomy tube in the stomach. IMPRESSION: 1. Successful placement of a percutaneous gastrostomy tube using fluoroscopic guidance. 2. Plan for removal of the T-fasteners in 10-14 days. Electronically Signed   By: Elene Griffes M.D.   On: 09/22/2023 14:13   DG Chest Port 1 View Result Date: 09/20/2023 CLINICAL DATA:  73 year old male respiratory failure, intubated. EXAM: PORTABLE CHEST 1 VIEW COMPARISON:  Portable chest yesterday and earlier. FINDINGS: Portable AP upright view at 0942 hours. Stable lines and tubes. Left chest pacer/resuscitation pads persist. Stable lung volumes and mediastinal contours. Stable left chest AICD. Stable ventilation since yesterday. No pneumothorax,  pulmonary edema. Patchy left greater than right lung base opacity. Paucity of bowel gas in the upper abdomen. Stable visualized osseous structures. IMPRESSION: 1. Stable lines and tubes. 2. Stable ventilation since yesterday. Patchy left greater than right lung base opacity. Electronically Signed   By: Marlise Simpers M.D.   On: 09/20/2023 10:26   DG Chest Port 1 View Result Date: 09/19/2023 CLINICAL DATA:  73 year old male with respiratory failure. EXAM: PORTABLE CHEST 1 VIEW COMPARISON:  Portable AP semi upright view at 0751 hours. FINDINGS: Portable AP semi upright view at 0751 hours. Less rotated to the left now. Pacer and resuscitation pads project over the lower chest. Stable left chest AICD. Endotracheal tube tip remains satisfactory. Stable lines and tubes. Stable low lung volumes, patchy perihilar and lung base opacity. Ventilation is stable with no pneumothorax, pleural effusion identified. And ventilation has improved compared to 09/17/2023. Stable cardiac size and mediastinal contours. Paucity of bowel gas now.  Stable visualized osseous structures. IMPRESSION: 1. Stable lines and tubes. 2. Stable ventilation since yesterday, improved since 09/17/2023. Stable low lung volumes and patchy perihilar and lung base opacity. Electronically Signed   By: Marlise Simpers M.D.   On: 09/19/2023 08:02   DG Abd 1 View Result Date: 09/18/2023 CLINICAL DATA:  Check gastric catheter placement EXAM: ABDOMEN - 1 VIEW COMPARISON:  09/17/2023 FINDINGS: Gastric catheter is noted coiled within the stomach. No free air is seen. Postsurgical changes in the lower lumbar spine are noted. No obstructive pattern is seen. IMPRESSION: Gastric catheter in the stomach. Electronically Signed   By: Violeta Grey M.D.   On: 09/18/2023 11:18   DG Chest Port 1 View Result Date: 09/18/2023 CLINICAL DATA:  Shortness of breath and chest pain EXAM: PORTABLE CHEST 1 VIEW COMPARISON:  09/17/2023 FINDINGS: Cardiac shadow is stable. Defibrillator is again  noted and stable. Endotracheal 2 and gastric  catheter are seen. Lungs are well aerated bilaterally. Improved aeration is noted with decrease in edematous changes. Some basilar opacities are again seen bilaterally. IMPRESSION: Improved aeration when compared with the prior exam. Some persistent basilar opacities remain. Electronically Signed   By: Violeta Grey M.D.   On: 09/18/2023 11:09   DG Abd 1 View Result Date: 09/17/2023 CLINICAL DATA:  OG tube placement. EXAM: ABDOMEN - 1 VIEW COMPARISON:  09/14/2023 FINDINGS: OG tube tip is positioned in the mid stomach with proximal side port below the expected location of the GE junction. Nonspecific bowel gas pattern within the visualized abdomen. IMPRESSION: OG tube tip is positioned in the mid stomach. Electronically Signed   By: Donnal Fusi M.D.   On: 09/17/2023 06:42   DG Chest Port 1 View Result Date: 09/17/2023 CLINICAL DATA:  73 year old male intubated, central line placement, bilateral airspace opacity. EXAM: PORTABLE CHEST 1 VIEW COMPARISON:  Portable chest 09/15/2023 and earlier. FINDINGS: Portable AP supine view at 0610 hours. Endotracheal tube projects over the midline trachea in good position between the clavicles and carina. Enteric tube has been removed. Stable right IJ approach central line. Stable left chest AICD. Similar lung volumes but diffuse bilateral increased pulmonary opacity with combined reticulonodular and vague/airspace appearance. No pneumothorax or pleural effusion. No air bronchograms. Stable cardiac size and mediastinal contours. No acute osseous abnormality identified. Paucity of bowel gas in the visible abdomen. IMPRESSION: 1. Satisfactory ET tube. Enteric tube removed. Stable right IJ approach central line. 2. Increased bilateral pulmonary opacity from two days ago, differential considerations include progressive pulmonary edema, bilateral infection, ARDS. No pleural effusion is evident. Electronically Signed   By: Marlise Simpers M.D.    On: 09/17/2023 06:36   ECHOCARDIOGRAM COMPLETE Result Date: 09/16/2023    ECHOCARDIOGRAM REPORT   Patient Name:   DR. Hannah Lewis III Date of Exam: 09/15/2023 Medical Rec #:  [id number]             Height:       66.0 in Accession #:    [id number]            Weight:       200.6 lb Date of Birth:  [birth year]             BSA:          2.002 m Patient Age:    73 years              BP:           106/68 mmHg Patient Gender: M                     HR:           77 bpm. Exam Location:  [facility] Procedure: 2D Echo, Cardiac Doppler and Color Doppler (Both Spectral and Color            Flow Doppler were utilized during procedure). Indications:     Elevated Troponin  History:         Patient has no prior history of Echocardiogram examinations.                  CAD, Stroke, Signs/Symptoms:Dyspnea; Risk Factors:Hypertension                  and Dyslipidemia.  Sonographer:     Brigid Canada RDCS Referring Phys:  [id number] Delanna Fears Diagnosing Phys: Sabina Custovic IMPRESSIONS  1. Left ventricular ejection fraction, by estimation,  is 35 to 40%. The left ventricle has moderately decreased function. The left ventricle demonstrates regional wall motion abnormalities (see scoring diagram/findings for description). Left ventricular  diastolic parameters are consistent with Grade I diastolic dysfunction (impaired relaxation).  2. Right ventricular systolic function is normal. The right ventricular size is normal. There is mildly elevated pulmonary artery systolic pressure. The estimated right ventricular systolic pressure is 42.7 mmHg.  3. The mitral valve is normal in structure. Moderate mitral valve regurgitation. No evidence of mitral stenosis.  4. The aortic valve is normal in structure. Aortic valve regurgitation is not visualized. No aortic stenosis is present.  5. The inferior vena cava is normal in size with greater than 50% respiratory variability, suggesting right atrial pressure of 3 mmHg. FINDINGS  Left Ventricle:  Left ventricular ejection fraction, by estimation, is 35 to 40%. The left ventricle has moderately decreased function. The left ventricle demonstrates regional wall motion abnormalities. The left ventricular internal cavity size was normal in size. There is no left ventricular hypertrophy. Left ventricular diastolic parameters are consistent with Grade I diastolic dysfunction (impaired relaxation).  LV Wall Scoring: The antero-lateral wall and apical lateral segment are hypokinetic. Right Ventricle: The right ventricular size is normal. No increase in right ventricular wall thickness. Right ventricular systolic function is normal. There is mildly elevated pulmonary artery systolic pressure. The tricuspid regurgitant velocity is 2.63  m/s, and with an assumed right atrial pressure of 15 mmHg, the estimated right ventricular systolic pressure is 42.7 mmHg. Left Atrium: Left atrial size was normal in size. Right Atrium: Right atrial size was normal in size. Pericardium: There is no evidence of pericardial effusion. Mitral Valve: The mitral valve is normal in structure. Moderate mitral valve regurgitation. No evidence of mitral valve stenosis. Tricuspid Valve: The tricuspid valve is normal in structure. Tricuspid valve regurgitation is mild. The aortic valve is normal in structure. Aortic valve regurgitation is not visualized. No aortic stenosis is present. Pulmonic Valve: The pulmonic valve was normal in structure. Pulmonic valve regurgitation is not visualized. Aorta: The aortic root is normal in size and structure. Venous: The inferior vena cava is normal in size with greater than 50% respiratory variability, suggesting right atrial pressure of 3 mmHg. IAS/Shunts: No atrial level shunt detected by color flow Doppler.  LEFT VENTRICLE PLAX 2D LVIDd:         5.50 cm   Diastology LVIDs:         4.80 cm   LV e' medial:    6.71 cm/s LV PW:         1.00 cm   LV E/e' medial:  12.9 LV IVS:        1.00 cm   LV e' lateral:    12.83 cm/s LVOT diam:     2.30 cm   LV E/e' lateral: 6.8 LV SV:         63 LV SV Index:   32 LVOT Area:     4.15 cm  RIGHT VENTRICLE             IVC RV Basal diam:  3.70 cm     IVC diam: 2.30 cm RV S prime:     14.50 cm/s TAPSE (M-mode): 2.5 cm LEFT ATRIUM             Index        RIGHT ATRIUM           Index LA diam:        4.70 cm  2.35 cm/m   RA Area:     12.60 cm LA Vol (A2C):   61.3 ml 30.61 ml/m  RA Volume:   31.10 ml  15.53 ml/m LA Vol (A4C):   36.9 ml 18.43 ml/m LA Biplane Vol: 48.5 ml 24.22 ml/m  AORTIC VALVE LVOT Vmax:   82.47 cm/s LVOT Vmean:  54.333 cm/s LVOT VTI:    0.153 m AI PHT:      376 msec  AORTA Ao Root diam: 3.20 cm Ao Asc diam:  3.70 cm MITRAL VALVE               TRICUSPID VALVE MV Area (PHT): 5.14 cm    TR Peak grad:   27.7 mmHg MV Decel Time: 148 msec    TR Vmax:        263.00 cm/s MV E velocity: 86.75 cm/s MV A velocity: 55.30 cm/s  SHUNTS MV E/A ratio:  1.57        Systemic VTI:  0.15 m                            Systemic Diam: 2.30 cm Lanell Pinta Custovic Electronically signed by Isabell Manzanilla Signature Date/Time: 09/16/2023/11:03:59 AM    Final    DG Chest Port 1 View Result Date: 09/15/2023 EXAM: 1 VIEW XRAY OF THE CHEST 09/15/2023 10:35:00 AM COMPARISON: 1 view chest x-ray 05/16/2023 at 2:38 PM. CLINICAL HISTORY: Endotracheally intubated. FINDINGS: LUNGS AND PLEURA: Mild edema and bilateral effusions are similar to the prior study. Bibasilar airspace opacity likely reflects atelectasis. HEART AND MEDIASTINUM: The heart is enlarged. BONES AND SOFT TISSUES: No acute osseous abnormality. LINES AND TUBES: The endotracheal tube is stable, 3.5 cm above the carina. A right IJ line is stable. IMPRESSION: 1. Stable endotracheal tube and right IJ line. 2. Enlarged heart. 3. Mild edema and bilateral effusions, similar to the prior study. 4. Bibasilar airspace opacity, likely atelectasis. Electronically signed by: Audree Leas MD 09/15/2023 01:26 PM EDT RP Workstation: [id number]    DG Chest Port 1 View Result Date: 09/15/2023 CLINICAL DATA:  Central line placement EXAM: PORTABLE CHEST 1 VIEW COMPARISON:  Radiograph and CT 09/14/2023 FINDINGS: Stable cardiomegaly. Pulmonary vascular congestion and bilateral ground-glass opacities. Small right pleural effusion and right basilar airspace opacities. No pneumothorax. Endotracheal tube tip in the intrathoracic trachea 3.0 cm from the carina. Subdiaphragmatic enteric tube. Right IJ CVC tip in the mid SVC. Left chest wall ICD. IMPRESSION: 1. Right IJ CVC tip in the mid SVC. No pneumothorax. 2. Similar findings of congestive heart failure. Electronically Signed   By: Rozell Cornet M.D.   On: 09/15/2023 02:52   CT Angio Chest PE W and/or Wo Contrast Result Date: 09/14/2023 CLINICAL DATA:  Cold symptoms, chest pain, short of breath, abdominal pain EXAM: CT ANGIOGRAPHY CHEST CT ABDOMEN AND PELVIS WITH CONTRAST TECHNIQUE: Multidetector CT imaging of the chest was performed using the standard protocol during bolus administration of intravenous contrast. Multiplanar CT image reconstructions and MIPs were obtained to evaluate the vascular anatomy. Multidetector CT imaging of the abdomen and pelvis was performed using the standard protocol during bolus administration of intravenous contrast. RADIATION DOSE REDUCTION: This exam was performed according to the departmental dose-optimization program which includes automated exposure control, adjustment of the mA and/or kV according to patient size and/or use of iterative reconstruction technique. CONTRAST:  OMNIPAQUE  IOHEXOL  350 MG/ML SOLN COMPARISON:  01/02/2010, 08/23/2013, 09/14/2023 FINDINGS: CTA CHEST FINDINGS Cardiovascular: This is a technically  adequate evaluation of the pulmonary vasculature. No filling defects or pulmonary emboli. Mild cardiomegaly with left ventricular dilatation. No pericardial effusion. Dual lead cardiac pacer, proximal lead in the right atrium and distal lead in the  right ventricle. 4.1 cm ascending thoracic aortic aneurysm. No evidence of dissection. Atherosclerosis of the aorta and coronary vasculature. Mediastinum/Nodes: Endotracheal tube terminates just above carina. Enteric catheter extends into the gastric lumen. No pathologic adenopathy. Lungs/Pleura: There is dense bilateral perihilar airspace disease, greatest in the lower lobes. Trace bilateral pleural effusions. No pneumothorax. Central airways are patent. Musculoskeletal: No acute or destructive bony abnormalities. Reconstructed images demonstrate no additional findings. Review of the MIP images confirms the above findings. CT ABDOMEN and PELVIS FINDINGS Hepatobiliary: No focal liver abnormality is seen. No gallstones, gallbladder wall thickening, or biliary dilatation. Pancreas: Unremarkable. No pancreatic ductal dilatation or surrounding inflammatory changes. Spleen: Normal in size without focal abnormality. Adrenals/Urinary Tract: Adrenal glands are unremarkable. Kidneys are normal, without renal calculi, focal lesion, or hydronephrosis. The bladder is decompressed with an indwelling Foley catheter. Stomach/Bowel: No bowel obstruction or ileus. Normal appendix right lower quadrant. Scattered colonic diverticulosis without evidence of acute diverticulitis. Enteric catheter tip within the lumen of the gastric body. Vascular/Lymphatic: Aortic atherosclerosis. No enlarged abdominal or pelvic lymph nodes. Reproductive: Stable enlargement of the prostate. Other: No free fluid or free intraperitoneal gas. No abdominal wall hernia. Musculoskeletal: No acute or destructive bony abnormalities. Postsurgical changes from L2-L4 posterior fusion. Reconstructed images demonstrate no additional findings. Review of the MIP images confirms the above findings. IMPRESSION: Chest: 1. No evidence of pulmonary embolus. 2. Dense bilateral perihilar airspace disease, greatest in the lower lobes, which could reflect widespread infection  or edema. 3. Trace bilateral pleural effusions. 4. Aortic Atherosclerosis (ICD10-I70.0). Coronary artery atherosclerosis. 5. 4.1 cm ascending thoracic aortic aneurysm. Recommend annual imaging followup by CTA or MRA. This recommendation follows 2010 ACCF/AHA/AATS/ACR/ASA/SCA/SCAI/SIR/STS/SVM Guidelines for the Diagnosis and Management of Patients with Thoracic Aortic Disease. Circulation. 2010; 121: Y782-N562. Aortic aneurysm NOS (ICD10-I71.9) Abdomen/pelvis: 1. No acute intra-abdominal or intrapelvic process. Normal appendix. 2. Distal colonic diverticulosis without diverticulitis. 3. Enlarged prostate. 4.  Aortic Atherosclerosis (ICD10-I70.0). Electronically Signed   By: Bobbye Burrow M.D.   On: 09/14/2023 22:40   CT ABDOMEN PELVIS W CONTRAST Result Date: 09/14/2023 CLINICAL DATA:  Cold symptoms, chest pain, short of breath, abdominal pain EXAM: CT ANGIOGRAPHY CHEST CT ABDOMEN AND PELVIS WITH CONTRAST TECHNIQUE: Multidetector CT imaging of the chest was performed using the standard protocol during bolus administration of intravenous contrast. Multiplanar CT image reconstructions and MIPs were obtained to evaluate the vascular anatomy. Multidetector CT imaging of the abdomen and pelvis was performed using the standard protocol during bolus administration of intravenous contrast. RADIATION DOSE REDUCTION: This exam was performed according to the departmental dose-optimization program which includes automated exposure control, adjustment of the mA and/or kV according to patient size and/or use of iterative reconstruction technique. CONTRAST:  OMNIPAQUE  IOHEXOL  350 MG/ML SOLN COMPARISON:  01/02/2010, 08/23/2013, 09/14/2023 FINDINGS: CTA CHEST FINDINGS Cardiovascular: This is a technically adequate evaluation of the pulmonary vasculature. No filling defects or pulmonary emboli. Mild cardiomegaly with left ventricular dilatation. No pericardial effusion. Dual lead cardiac pacer, proximal lead in the right  atrium and distal lead in the right ventricle. 4.1 cm ascending thoracic aortic aneurysm. No evidence of dissection. Atherosclerosis of the aorta and coronary vasculature. Mediastinum/Nodes: Endotracheal tube terminates just above carina. Enteric catheter extends into the gastric lumen. No pathologic adenopathy. Lungs/Pleura: There is dense bilateral  perihilar airspace disease, greatest in the lower lobes. Trace bilateral pleural effusions. No pneumothorax. Central airways are patent. Musculoskeletal: No acute or destructive bony abnormalities. Reconstructed images demonstrate no additional findings. Review of the MIP images confirms the above findings. CT ABDOMEN and PELVIS FINDINGS Hepatobiliary: No focal liver abnormality is seen. No gallstones, gallbladder wall thickening, or biliary dilatation. Pancreas: Unremarkable. No pancreatic ductal dilatation or surrounding inflammatory changes. Spleen: Normal in size without focal abnormality. Adrenals/Urinary Tract: Adrenal glands are unremarkable. Kidneys are normal, without renal calculi, focal lesion, or hydronephrosis. The bladder is decompressed with an indwelling Foley catheter. Stomach/Bowel: No bowel obstruction or ileus. Normal appendix right lower quadrant. Scattered colonic diverticulosis without evidence of acute diverticulitis. Enteric catheter tip within the lumen of the gastric body. Vascular/Lymphatic: Aortic atherosclerosis. No enlarged abdominal or pelvic lymph nodes. Reproductive: Stable enlargement of the prostate. Other: No free fluid or free intraperitoneal gas. No abdominal wall hernia. Musculoskeletal: No acute or destructive bony abnormalities. Postsurgical changes from L2-L4 posterior fusion. Reconstructed images demonstrate no additional findings. Review of the MIP images confirms the above findings. IMPRESSION: Chest: 1. No evidence of pulmonary embolus. 2. Dense bilateral perihilar airspace disease, greatest in the lower lobes, which could  reflect widespread infection or edema. 3. Trace bilateral pleural effusions. 4. Aortic Atherosclerosis (ICD10-I70.0). Coronary artery atherosclerosis. 5. 4.1 cm ascending thoracic aortic aneurysm. Recommend annual imaging followup by CTA or MRA. This recommendation follows 2010 ACCF/AHA/AATS/ACR/ASA/SCA/SCAI/SIR/STS/SVM Guidelines for the Diagnosis and Management of Patients with Thoracic Aortic Disease. Circulation. 2010; 121: N829-F621. Aortic aneurysm NOS (ICD10-I71.9) Abdomen/pelvis: 1. No acute intra-abdominal or intrapelvic process. Normal appendix. 2. Distal colonic diverticulosis without diverticulitis. 3. Enlarged prostate. 4.  Aortic Atherosclerosis (ICD10-I70.0). Electronically Signed   By: Bobbye Burrow M.D.   On: 09/14/2023 22:40   CT HEAD WO CONTRAST ( ) Result Date: 09/14/2023 CLINICAL DATA:  Head trauma, minor (Age >= 65y) EXAM: CT HEAD WITHOUT CONTRAST TECHNIQUE: Contiguous axial images were obtained from the base of the skull through the vertex without intravenous contrast. RADIATION DOSE REDUCTION: This exam was performed according to the departmental dose-optimization program which includes automated exposure control, adjustment of the mA and/or kV according to patient size and/or use of iterative reconstruction technique. COMPARISON:  CT head 08/15/2022 FINDINGS: Brain: Bilateral anterior inferior frontal lobe and bilateral anterior temporal lobe encephalomalacia. Left cerebellar encephalomalacia. No evidence of large-territorial acute infarction. No parenchymal hemorrhage. No mass lesion. No extra-axial collection. No mass effect or midline shift. No hydrocephalus. Basilar cisterns are patent. Vascular: No hyperdense vessel. Atherosclerotic calcifications are present within the cavernous internal carotid and vertebral arteries. Skull: No acute fracture or focal lesion. Old left occipital skull fracture. Sinuses/Orbits: Paranasal sinuses and mastoid air cells are clear. Bilateral lens  replacement. Bilateral scleral buckle. Otherwise the orbits are unremarkable. Other: Partially visualized endotracheal tube. IMPRESSION: No acute intracranial abnormality. Electronically Signed   By: Morgane  Naveau M.D.   On: 09/14/2023 22:36   DG Abd Portable 1 View Result Date: 09/14/2023 CLINICAL DATA:  Enteric catheter placement EXAM: PORTABLE ABDOMEN - 1 VIEW COMPARISON:  None Available. FINDINGS: Frontal view of the lower chest and upper abdomen demonstrates enteric catheter passing below diaphragm tip projecting over gastric body. Interstitial and ground-glass opacities throughout the lungs consistent with edema. Unremarkable bowel gas pattern. IMPRESSION: 1. Enteric catheter tip projects over gastric body. Electronically Signed   By: Bobbye Burrow M.D.   On: 09/14/2023 20:55   DG Chest Port 1 View Result Date: 09/14/2023 CLINICAL DATA:  Intubated, CHF,  tachypnea EXAM: PORTABLE CHEST 1 VIEW COMPARISON:  09/14/2023 FINDINGS: Single frontal view of the chest demonstrates endotracheal tube overlying tracheal air column, tip of proximally 2 cm above carina. Dual lead pacemaker/AICD unchanged. Cardiac silhouette remains mildly enlarged. Stable interstitial and ground-glass opacities throughout the lungs. No large effusion or pneumothorax. No acute bony abnormalities. IMPRESSION: 1. No complication after intubation. 2. Stable findings of congestive heart failure. Electronically Signed   By: Bobbye Burrow M.D.   On: 09/14/2023 20:55   DG Chest Port 1 View Result Date: 09/14/2023 CLINICAL DATA:  Tachypnea.  CHF EXAM: PORTABLE CHEST 1 VIEW COMPARISON:  X-ray 01/14/2022 and older FINDINGS: Left upper chest battery pack for defibrillator with leads along the right side of the heart. Stable cardiopericardial silhouette. Tortuous aorta. Increasing vascular congestion and component of possible edema. No pneumothorax or effusion. No consolidation. Degenerative changes along the spine. Overlapping cardiac leads.  IMPRESSION: Increasing vascular congestion and interstitial changes, possible edema. Defibrillator. Electronically Signed   By: Adrianna Horde M.D.   On: 09/14/2023 18:20    EKG: Sinus tachycardia on monitor  ASSESSMENT AND PLAN:  Hypoxic respiratory failure status post trach Shock state, resolved, off pressors for many days and blood pressure elevated currently CAD with prior PCI Acute on chronic heart failure with reduced EF NSVT Hypertension  Overall stable from cardiac standpoint. P.o. Lasix  20 mg daily Continue metoprolol  50 mg twice daily. Increase losartan  to 50 mg daily Increase spironolactone  to 25 mg daily. Can further uptitrate goal-directed medical therapy as blood pressure tolerates Continue p.o. amiodarone  Patient likely going to [facility] tomorrow.  Cardiology follow-up as outpatient  Signed: Janette Medley MD 09/27/2023, 9:47 AM

## 2023-09-27 NOTE — Consult Note (Signed)
 PHARMACY CONSULT NOTE - ELECTROLYTES  Pharmacy Consult for Electrolyte Monitoring and Replacement   Recent Labs: Potassium (mmol/L)  Date Value  09/27/2023 4.3  05/23/2014 4.1   Magnesium  (mg/dL)  Date Value  16/01/9603 2.1  09/13/2013 1.9   Calcium  (mg/dL)  Date Value  54/12/8117 8.6 (L)   Calcium , Total (mg/dL)  Date Value  14/78/2956 8.5   Albumin (g/dL)  Date Value  21/30/8657 2.4 (L)  05/23/2014 3.7   Phosphorus (mg/dL)  Date Value  84/69/6295 3.3   Sodium (mmol/L)  Date Value  09/27/2023 142  05/23/2014 143   Height: 5' 5.98" (167.6 cm) Weight: 83.9 kg (184 lb 15.5 oz) IBW/kg (Calculated) : 63.76 Estimated Creatinine Clearance: 78.1 mL/min (by C-G formula based on SCr of 0.83 mg/dL).  Assessment  Blake [name] is a 73 y.o. male presenting with acute respiratory failure and shock. 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 Pharmacy has been consulted to monitor and replace electrolytes.  Pertinent medications: Amiodarone , Aldactone , PO Lasix , losartan   Goal of Therapy: Electrolytes within normal limits  Plan:  No replacement needed. Will sign off on consult given stability. Continue to monitor peripherally  Thank you for allowing pharmacy to be a part of this patient's care.  Lillias Difrancesco B Bessye Stith 09/27/2023 7:15 AM

## 2023-09-27 NOTE — Progress Notes (Signed)
 PHARMACY - ANTICOAGULATION CONSULT NOTE  Pharmacy Consult for Heparin   Indication: DVT  Allergies  Allergen Reactions   Promethazine Other (See Comments)    Severe neuroleptic malignant syndrome requiring intubation   Mirtazapine Other (See Comments)    Other reaction(s): Other (See Comments) Irritability/anger, increased appetite, worsening depression Irritability/anger, increased appetite, worsening depression     Patient Measurements: Height: 5\' 6"  (167.6 cm) Weight: 83.9 kg (184 lb 15.5 oz) IBW/kg (Calculated) : 63.8 HEPARIN  DW (KG): 81  Vital Signs: Temp: 100 F (37.8 C) (06/08 2000) Temp Source: Axillary (06/08 1600) BP: 169/74 (06/08 2100) Pulse Rate: 95 (06/08 2100)  Labs: Recent Labs    09/25/23 0427 09/26/23 0508 09/26/23 0554 09/27/23 0417 09/27/23 2124  HGB 13.5  --  13.3 13.0  --   HCT 41.5  --  41.7 40.8  --   PLT 340  --  379 429*  --   HEPARINUNFRC  --   --   --   --  0.18*  CREATININE 0.82 0.76  --  0.83  --     Estimated Creatinine Clearance: 78.1 mL/min (by C-G formula based on SCr of 0.83 mg/dL).   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   Coronary artery disease    Dementia (HCC)    Depression    Disorder of male genital organs 07/11/2013   Dyspnea    Encounter for long-term (current) use of medications 11/21/2014   Erectile dysfunction 07/11/2013   Executive function deficit 03/06/2016   GERD (gastroesophageal reflux disease)    Headache    stress   Hearing loss    Hyperlipidemia    Hypertension    Hypogonadism, 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   Hypotestosteronism  07/11/2013   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   Major depression, recurrent, chronic (HCC) 03/06/2016   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  No prior anticoag noted. CrCl = 78.1 ml/min   Goal of Therapy:  Heparin  level 0.3-0.7 units/ml Monitor platelets by anticoagulation protocol: Yes   Plan:  6/8:  HL @ 2124 = 0.18, SUBtherapeutic - will order heparin  2700 units IV X 1 bolus and increase drip rate to 1600 units/hr - will recheck HL 8 hrs after rate change on 6/9 @ 0600. - CBC daily   Kimon Loewen D 09/27/2023,10:09 PM

## 2023-09-28 ENCOUNTER — Inpatient Hospital Stay

## 2023-09-28 DIAGNOSIS — Z930 Tracheostomy status: Secondary | ICD-10-CM | POA: Diagnosis not present

## 2023-09-28 DIAGNOSIS — J150 Pneumonia due to Klebsiella pneumoniae: Secondary | ICD-10-CM

## 2023-09-28 DIAGNOSIS — I82622 Acute embolism and thrombosis of deep veins of left upper extremity: Secondary | ICD-10-CM

## 2023-09-28 DIAGNOSIS — J80 Acute respiratory distress syndrome: Secondary | ICD-10-CM | POA: Diagnosis not present

## 2023-09-28 LAB — CBC
HCT: 38.5 % — ABNORMAL LOW (ref 39.0–52.0)
Hemoglobin: 12.6 g/dL — ABNORMAL LOW (ref 13.0–17.0)
MCH: 31.3 pg (ref 26.0–34.0)
MCHC: 32.7 g/dL (ref 30.0–36.0)
MCV: 95.8 fL (ref 80.0–100.0)
Platelets: 454 10*3/uL — ABNORMAL HIGH (ref 150–400)
RBC: 4.02 MIL/uL — ABNORMAL LOW (ref 4.22–5.81)
RDW: 13.5 % (ref 11.5–15.5)
WBC: 19.3 10*3/uL — ABNORMAL HIGH (ref 4.0–10.5)
nRBC: 0 % (ref 0.0–0.2)

## 2023-09-28 LAB — LACTIC ACID, PLASMA
Lactic Acid, Venous: 1.4 mmol/L (ref 0.5–1.9)
Lactic Acid, Venous: 1.6 mmol/L (ref 0.5–1.9)

## 2023-09-28 LAB — CULTURE, RESPIRATORY W GRAM STAIN

## 2023-09-28 LAB — GLUCOSE, CAPILLARY
Glucose-Capillary: 126 mg/dL — ABNORMAL HIGH (ref 70–99)
Glucose-Capillary: 137 mg/dL — ABNORMAL HIGH (ref 70–99)
Glucose-Capillary: 139 mg/dL — ABNORMAL HIGH (ref 70–99)
Glucose-Capillary: 140 mg/dL — ABNORMAL HIGH (ref 70–99)
Glucose-Capillary: 155 mg/dL — ABNORMAL HIGH (ref 70–99)
Glucose-Capillary: 168 mg/dL — ABNORMAL HIGH (ref 70–99)

## 2023-09-28 LAB — BLOOD GAS, ARTERIAL
Acid-Base Excess: 3.7 mmol/L — ABNORMAL HIGH (ref 0.0–2.0)
Bicarbonate: 28.5 mmol/L — ABNORMAL HIGH (ref 20.0–28.0)
FIO2: 60 %
Mechanical Rate: 15
O2 Saturation: 99.3 %
PEEP: 10 cmH2O
Patient temperature: 37
Pressure control: 10 cmH2O
pCO2 arterial: 43 mmHg (ref 32–48)
pH, Arterial: 7.43 (ref 7.35–7.45)
pO2, Arterial: 96 mmHg (ref 83–108)

## 2023-09-28 LAB — MAGNESIUM: Magnesium: 2.3 mg/dL (ref 1.7–2.4)

## 2023-09-28 LAB — BASIC METABOLIC PANEL WITH GFR
Anion gap: 8 (ref 5–15)
BUN: 35 mg/dL — ABNORMAL HIGH (ref 8–23)
CO2: 24 mmol/L (ref 22–32)
Calcium: 8.3 mg/dL — ABNORMAL LOW (ref 8.9–10.3)
Chloride: 110 mmol/L (ref 98–111)
Creatinine, Ser: 0.95 mg/dL (ref 0.61–1.24)
GFR, Estimated: >60 mL/min (ref >60)
Glucose, Bld: 150 mg/dL — ABNORMAL HIGH (ref 70–99)
Potassium: 4.5 mmol/L (ref 3.5–5.1)
Sodium: 142 mmol/L (ref 135–145)

## 2023-09-28 LAB — TROPONIN I (HIGH SENSITIVITY)
Troponin I (High Sensitivity): 83 ng/L — ABNORMAL HIGH (ref <18)
Troponin I (High Sensitivity): 84 ng/L — ABNORMAL HIGH (ref <18)

## 2023-09-28 LAB — HEPARIN LEVEL (UNFRACTIONATED)
Heparin Unfractionated: 0.16 [IU]/mL — ABNORMAL LOW (ref 0.30–0.70)
Heparin Unfractionated: 0.49 [IU]/mL (ref 0.30–0.70)

## 2023-09-28 LAB — FIBRINOGEN: Fibrinogen: >800 mg/dL — ABNORMAL HIGH (ref 210–475)

## 2023-09-28 LAB — D-DIMER, QUANTITATIVE: D-Dimer, Quant: 2.47 ug{FEU}/mL — ABNORMAL HIGH (ref 0.00–0.50)

## 2023-09-28 LAB — PROCALCITONIN: Procalcitonin: 0.35 ng/mL

## 2023-09-28 MED ORDER — METOPROLOL TARTRATE 50 MG PO TABS
75.0000 mg | ORAL_TABLET | Freq: Two times a day (BID) | ORAL | Status: AC
  Administered 2023-09-28: 75 mg
  Filled 2023-09-28: qty 1

## 2023-09-28 MED ORDER — METOPROLOL TARTRATE 50 MG PO TABS: 50.0000 mg | ORAL_TABLET | Freq: Two times a day (BID) | ORAL | Status: DC

## 2023-09-28 MED ORDER — NOREPINEPHRINE 4 MG/250ML-% IV SOLN
INTRAVENOUS | Status: AC
  Administered 2023-09-28: 2 ug/min via INTRAVENOUS
  Filled 2023-09-28: qty 250

## 2023-09-28 MED ORDER — FENTANYL CITRATE PF 50 MCG/ML IJ SOSY
50.0000 ug | PREFILLED_SYRINGE | INTRAMUSCULAR | Status: DC | PRN
  Administered 2023-09-28 – 2023-09-29 (×3): 50 ug via INTRAVENOUS
  Filled 2023-09-28 (×3): qty 1

## 2023-09-28 MED ORDER — OXYCODONE HCL 5 MG PO TABS
10.0000 mg | ORAL_TABLET | Freq: Four times a day (QID) | ORAL | Status: DC
  Administered 2023-09-28 – 2023-09-29 (×5): 10 mg
  Filled 2023-09-28 (×5): qty 2

## 2023-09-28 MED ORDER — METOPROLOL TARTRATE 50 MG PO TABS
50.0000 mg | ORAL_TABLET | Freq: Two times a day (BID) | ORAL | Status: DC
  Administered 2023-09-29: 50 mg
  Filled 2023-09-28: qty 1

## 2023-09-28 MED ORDER — QUETIAPINE FUMARATE 25 MG PO TABS
25.0000 mg | ORAL_TABLET | Freq: Two times a day (BID) | ORAL | Status: DC
  Administered 2023-09-28 – 2023-09-29 (×3): 25 mg
  Filled 2023-09-28 (×3): qty 1

## 2023-09-28 MED ORDER — HEPARIN BOLUS VIA INFUSION
2400.0000 [IU] | Freq: Once | INTRAVENOUS | Status: AC
  Administered 2023-09-28: 2400 [IU] via INTRAVENOUS
  Filled 2023-09-28: qty 2400

## 2023-09-28 MED ORDER — PIPERACILLIN-TAZOBACTAM 3.375 G IVPB
3.3750 g | Freq: Three times a day (TID) | INTRAVENOUS | Status: DC
  Administered 2023-09-28 – 2023-09-29 (×4): 3.375 g via INTRAVENOUS
  Filled 2023-09-28 (×4): qty 50

## 2023-09-28 MED ORDER — NOREPINEPHRINE 4 MG/250ML-% IV SOLN
0.0000 ug/min | INTRAVENOUS | Status: DC
  Administered 2023-09-28: 10 ug/min via INTRAVENOUS
  Administered 2023-09-29: 4 ug/min via INTRAVENOUS
  Filled 2023-09-28 (×2): qty 250

## 2023-09-28 MED ORDER — PIPERACILLIN-TAZOBACTAM 3.375 G IVPB: 3.3750 g | Freq: Three times a day (TID) | INTRAVENOUS | Status: DC

## 2023-09-28 MED ORDER — SODIUM CHLORIDE 0.9 % IV BOLUS
1000.0000 mL | Freq: Once | INTRAVENOUS | Status: AC
  Administered 2023-09-28: 1000 mL via INTRAVENOUS

## 2023-09-28 NOTE — Progress Notes (Signed)
 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   Per ed reports, and patient's wife who is at the bedside, Patient was seen at the urgent care for chief complaints of chills, body aches, intermittently and nonproductive coughing fits. He was diagnosed with acute upper respiratory infection and was sent home with promethazine-dextromethorphan and cefdinir (OMNICEF). [name] report that he took the prescription and went to bed. Later he woke up altered and delusional and c/o CP and SOB. He was noted to be sweating profusely with abnormal body temp per wife.   ED Course: Initial vital signs showed HR >180 beats/minute, BP >160 mm Hg, the RR 30s-40s breaths/minute, and the oxygen saturation 80s% on NRB and a temperature of 103.58F (39.6C).    Pertinent Labs/Diagnostics Findings: Na+/ K+:138/3.9  Glucose:127 CO2 21 WBC: unremarkable Lactic acid: 1.5~2.6 CK: COVID PCR: Negative troponin:106~7453   BNP:573.5  VBG: pO2 pend; pCO2 37; pH 7.49;  HCO3 28.2, %O2 Sat 43.7.  CXR> CTH> CTA Chest> CT Abd/pelvis>see report   Patient's agitation and restlessness worsen with HR up in the 170s to 180s.  2 mg of Ativan  was trialed but patient did not calm down. Given worsening symptoms and  high risk for decompensation, patient intubated for airway protection. He was started on broad-spectrum antibiotics Ampicillin , vanc, Ceftriaxone  and Acyclovir  due to unclear source for possible infection and cover broadly to include treatment for meningitis. PCCM consulted for admission.  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 and fell off, then he got back onto the horse and fell again on asphalt), HFrEF, ischemic cardiomyopathy, Hyperlipidemia, SVT, CAD, Depression, GERD, Hearing loss of both ears, MI, CVA, Hypertension, Hypotestosteronemia, ICD in place [facility] [employer] [id number] DEFIBRILLATOR, RESONATE EL DR DF4 S/N: [phone] implanted 11/21/2021), and OSA on CPAP   Significant Hospital Events: Including procedures, antibiotic start and stop dates in addition to other pertinent events   5/26: Admitted to ICU with acute altered mental status and respiratory failure in the setting of suspected NMS versus sepsis requiring intubation 5/27: fever improved, continued on vasopressors 5/28: extubated in the afternoon. Respiratory failure overnight, re-intubated 5/29: vented, increased oxygen requirements. Family at the bedside. Heart failure consult placed 5/30: vent requirements improved, diuresing, sedated. Blake [name] with SBT 5/31: remains vented, WUA this AM 6/01: vented, sedated, afebrile 6/2 remains on vent 6/3 remains on vent, TRACH PEG TUBE pending 6/4 s/p trach 6/5 tolerated PS 5/5 6/6 tolerated PS 5/5 6/7 tolerated PS 5/5, US  +left arm Thrombus  Interim History / Subjective:  Patient appears in discomfort today with increased work of breathing and increasing sputum production.   Sputum culture from 06/07 growing Klebsiella pneumonia.   CXR this am with worsening left retrocardiac  opacity.   Objective    Blood pressure  139/64, pulse (!) 119, temperature (!) 101.9 F (38.8 C), temperature source Oral, resp. rate 16, height 5\' 6"  (1.676 m), weight 83.6 kg, SpO2 96%.    Vent Mode: PCV FiO2 (%):  [30 %-65 %] 60 % Set Rate:  [15 bmp] 15 bmp Vt Set:  [500 mL] 500 mL PEEP:  [5 cmH20-10 cmH20] 10 cmH20 Plateau Pressure:  [13 cmH20-18 cmH20] 13 cmH20   Intake/Output Summary (Last 24 hours) at 09/28/2023 1120 Last data filed at 09/28/2023 0800 Gross per 24 hour  Intake 955.27 ml  Output 1675 ml  Net -719.73 ml   Filed Weights   09/26/23 0500 09/27/23 0434 09/28/23 0433  Weight: 83.3 kg 83.9 kg 83.6 kg    Examination: General: Trached and Peg, appears uncomfortable  HENT: Supple neck, reactive pupils  Lungs: Coarse breath sounds bilaterally Cardiovascular: Normal S1, Normal S2, RRR  Abdomen: Soft, non tender, non distended  Extremities: Warm and well perfused, no edema.   Labs and imaging were reviewed/   Assessment and Plan  Case of a 73 year old male patient with a past medical history of CAD status post PCI to the LAD complicated by ischemic cardiomyopathy status post ICD placement, OSA on CPAP who presented to [facility] on 05/26 with altered mental status and appears to have multifocal pneumonia with course complicated by ARDS now status post tracheostomy tube placement on 06/04 and gastrostomy tube placement by IR on 06/03.  Course complicated by DVT of the left axillary basilic and brachial veins.  # Acute hypoxic respiratory failure status post trach on 06/04 and PEG on 06/03 secondary to... # ARDS in the setting of multifocal pneumonia with course complicated by # DVT of the left upper extremity now on heparin  drip # Now with persistent fever, sputum production and sputum culture growing Klebsiella pneumonia  Neuro: Increase oxycodone  to 10 mg every 6 hours.  Start Seroquel  25 mg twice daily.  Continue Prozac 20 mg daily.  Continue with valproic  acid to 50 mg 3 times per day. CVS: Agree with losartan   50 mg daily, atorvastatin  40 mg daily and spironolactone  25 mg daily. Lungs/ID: Restart Zosyn  to complete 7-day course. Renal: Monitor UOP holding off on diuresis for now. Heme: On heparin  drip Endo: POC goal 140-180  Best Practice (right click and "Reselect all SmartList Selections" daily)   Diet/type: tubefeeds DVT prophylaxis systemic heparin  Pressure ulcer(s): N/A GI prophylaxis: PPI Lines: Central line Foley:  removal ordered  Code Status:  full code  Last date of multidisciplinary goals of care discussion [09/28/2023]  Critical care time: 60 minutes     Annitta Kindler, MD [facility] 09/28/2023 11:34 AM

## 2023-09-28 NOTE — Plan of Care (Signed)
  Problem: Education: Goal: Knowledge of General Education information will improve Description: Including pain rating scale, medication(s)/side effects and non-pharmacologic comfort measures Outcome: Progressing   Problem: Clinical Measurements: Goal: Ability to maintain clinical measurements within normal limits will improve Outcome: Progressing   Problem: Nutrition: Goal: Adequate nutrition will be maintained Outcome: Progressing   Problem: Elimination: Goal: Will not experience complications related to bowel motility Outcome: Progressing   Problem: Skin Integrity: Goal: Risk for impaired skin integrity will decrease Outcome: Progressing   Problem: Respiratory: Goal: Ability to maintain a clear airway and adequate ventilation will improve Outcome: Progressing   Problem: Activity: Goal: Risk for activity intolerance will decrease Outcome: Not Progressing

## 2023-09-28 NOTE — Progress Notes (Signed)
 PHARMACY - ANTICOAGULATION CONSULT NOTE  Pharmacy Consult for Heparin   Indication: DVT  Allergies  Allergen Reactions   Promethazine Other (See Comments)    Severe neuroleptic malignant syndrome requiring intubation   Mirtazapine Other (See Comments)    Other reaction(s): Other (See Comments) Irritability/anger, increased appetite, worsening depression Irritability/anger, increased appetite, worsening depression     Patient Measurements: Height: 5\' 6"  (167.6 cm) Weight: 83.6 kg (184 lb 4.9 oz) IBW/kg (Calculated) : 63.8 HEPARIN  DW (KG): 81  Vital Signs: Temp: 98.3 F (36.8 C) (06/09 0400) Temp Source: Axillary (06/09 0000) BP: 123/59 (06/09 0600) Pulse Rate: 92 (06/09 0600)  Labs: Recent Labs    09/26/23 0508 09/26/23 0554 09/26/23 0554 09/27/23 0417 09/27/23 2124 09/28/23 0554 09/28/23 0555  HGB  --  13.3   < > 13.0  --  12.6*  --   HCT  --  41.7  --  40.8  --  38.5*  --   PLT  --  379  --  429*  --  454*  --   HEPARINUNFRC  --   --   --   --  0.18*  --  0.16*  CREATININE 0.76  --   --  0.83  --  0.95  --    < > = values in this interval not displayed.    Estimated Creatinine Clearance: 68.1 mL/min (by C-G formula based on SCr of 0.95 mg/dL).   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   Coronary artery disease    Dementia (HCC)    Depression    Disorder of male genital organs 07/11/2013   Dyspnea    Encounter for long-term (current) use of medications 11/21/2014   Erectile dysfunction 07/11/2013   Executive function deficit 03/06/2016   GERD (gastroesophageal reflux disease)     Headache    stress   Hearing loss    Hyperlipidemia    Hypertension    Hypogonadism, 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   Major depression, recurrent, chronic (HCC) 03/06/2016   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  No prior anticoag noted. CrCl = 78.1 ml/min   Goal of Therapy:  Heparin  level 0.3-0.7 units/ml Monitor platelets by anticoagulation protocol: Yes   Plan:  6/9:  HL @ 0555 = 0.16, SUBtherapeutic - RN confirmed heparin  had been running without interruption - will order heparin  2400 units IV X  1 and increase drip rate 1900 units/hr. - will recheck HL 8 hrs after rate change. - CBC daily   Yuritzi Kamp D 09/28/2023,7:01 AM

## 2023-09-28 NOTE — Progress Notes (Signed)
 [facility] CLINIC CARDIOLOGY PROGRESS NOTE       Patient ID: Blake [name] [name] MRN: [id number] DOB/AGE: 1950/08/13 73 y.o.  Admit date: 09/14/2023 Referring Physician Dr. Vergia Glasgow Primary Physician Rory Collard, MD Primary Cardiologist Dr. Beau Bound Reason for Consultation Elevated trops, NSTEMI  HPI: Blake [name] [name] is a 73 y.o. male  with a past medical history of coronary artery disease s/p stent to LAD, ischemic cardiomyopathy s/p ICD ([employer]), chronic HFrEF, OSA (on CPAP), hx CVA who presented to the ED on 09/14/2023 for dyspnea, chest pain and AMS. Patient recently seen at urgent care for nonproductive cough, chills/diaphoresis Troponins found to be elevated.  Cardiology was consulted for further evaluation.   Interval History: -Patient seen and examined. Patient sedated with trach in place.  -Overnight Tele showed no significant events, no arrhthymias on monitor.  -Patient appears euvolemic.   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   Coronary artery disease    Dementia (HCC)    Depression    Disorder of male genital organs 07/11/2013   Dyspnea    Encounter for long-term (current) use of medications 11/21/2014   Erectile dysfunction 07/11/2013   Executive function deficit 03/06/2016   GERD (gastroesophageal reflux disease)    Headache    stress   Hearing loss    Hyperlipidemia    Hypertension    Hypogonadism, 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   Major  depression, recurrent, chronic (HCC) 03/06/2016   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   Mixed hyperlipidemia 02/02/2014   Myocardial infarction (HCC) 08/2013   OCD (obsessive compulsive disorder)    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   Other specified disorder of male genital organs(608.89) 07/11/2013   Prostatic hypertrophy    Pseudophakia of both eyes 04/20/2015   Raynaud's disease    Reduced libido 07/11/2013   Repeated falls    weak left ankle   Scoliosis    Stroke (HCC) 2/16   "light"   Synovitis    knees, ankles    Past Surgical History:  Procedure Laterality Date   BACK SURGERY  2011   rods and screws   CLOSED REDUCTION NASAL FRACTURE Bilateral 06/11/2018   Procedure: CLOSED REDUCTION NASAL FRACTURE;  Surgeon: [name], MD;  Location: [facility] ORS;  Service: ENT;  Laterality: Bilateral;   COLONOSCOPY  2015   CORONARY ANGIOPLASTY     2015 stent   CORONARY STENT INTERVENTION N/A 06/08/2019   Procedure: CORONARY STENT INTERVENTION;  Surgeon: Antonette Batters, MD;  Location: [facility] INVASIVE CV LAB;  Service: Cardiovascular;  Laterality: N/A;   CYSTOSCOPY  1984   EYE SURGERY Bilateral 2005,2006,2009   detached retina, cataract   FOOT ARTHRODESIS Left 05/30/2015   Procedure: ARTHRODESIS FOOT STJ LT FOOT;  Surgeon: Anell Baptist, DPM;  Location: [facility];  Service: Podiatry;  Laterality: Left;  WITH POPLITEAL BLOCK   FOOT SURGERY Left    HERNIA REPAIR  Right 1969   inguinal   IR GASTROSTOMY TUBE MOD SED  09/22/2023   LEFT HEART CATH AND CORONARY ANGIOGRAPHY Left 06/08/2019   Procedure: LEFT HEART CATH AND CORONARY ANGIOGRAPHY;  Surgeon: Antonette Batters, MD;  Location: [facility] INVASIVE CV LAB;  Service: Cardiovascular;  Laterality: Left;   SEPTOPLASTY Bilateral 06/11/2018   Procedure: SEPTOPLASTY;  Surgeon: [name], MD;  Location: [facility] ORS;  Service: ENT;  Laterality: Bilateral;   SHOULDER SURGERY Right 2004   skull surgery      TOE SURGERY Right 2009   TONSILLECTOMY  2008   TRACHEOSTOMY TUBE PLACEMENT N/A 09/23/2023   Procedure: CREATION, TRACHEOSTOMY;  Surgeon: Milon Aloe, MD;  Location: [facility] ORS;  Service: ENT;  Laterality: N/A;    Medications Prior to Admission  Medication Sig Dispense Refill Last Dose/Taking   acetaminophen  (TYLENOL ) 500 MG tablet Take 1,000 mg by mouth every 6 (six) hours as needed for mild pain or moderate pain.   Unknown   aspirin  EC 81 MG tablet Take 81 mg by mouth daily.   09/14/2023 at  6:00 AM   B Complex-C (SUPER B COMPLEX PO) Take 1 tablet by mouth daily.   09/14/2023 Morning   Calcium  Carbonate-Vitamin D  600-200 MG-UNIT CAPS Take 1 tablet by mouth 2 (two) times daily.    09/14/2023 Morning   carvedilol  (COREG ) 3.125 MG tablet Take 3.125 mg by mouth daily.    09/14/2023 Morning   [EXPIRED] cefdinir (OMNICEF) 300 MG capsule Take 300 mg by mouth 2 (two) times daily.   09/14/2023 Noon   clindamycin  (CLEOCIN  T) 1 % lotion Apply 1 application topically 2 (two) times daily as needed.    Unknown   clopidogrel  (PLAVIX ) 75 MG tablet Take 75 mg by mouth daily.   09/14/2023 at  6:00 AM   divalproex  (DEPAKOTE  ER) 250 MG 24 hr tablet Total of 750 mg daily. Take along with 500 mg tab 30 tablet 0 09/14/2023 at  8:00 AM   divalproex  (DEPAKOTE  ER) 500 MG 24 hr tablet Take 1 tablet (500 mg total) by mouth daily. 30 tablet 1 09/14/2023 at  8:00 AM   DULoxetine  (CYMBALTA ) 60 MG capsule Take 60 mg by mouth 2 (two) times daily.    09/14/2023 at  3:30 AM   FIBER PO Take 1 tablet by mouth 2 (two) times daily.    09/14/2023 Morning   furosemide  (LASIX ) 20 MG tablet Take 20 mg by mouth daily.   09/14/2023 Morning   latanoprost  (XALATAN ) 0.005 % ophthalmic solution Place 1 drop into the left eye daily.   09/14/2023 Morning   losartan  (COZAAR ) 100 MG tablet Take 100 mg by mouth daily.   09/14/2023 Morning   Melatonin 5 MG TABS Take 10 mg by mouth at bedtime. 30 min before bed   09/13/2023 Bedtime   omeprazole (PRILOSEC) 20 MG  capsule Take 20 mg by mouth daily.   09/14/2023 Morning   Probiotic Product (PROBIOTIC DAILY PO) Take 1 tablet by mouth daily. Senior   09/14/2023 Morning   silodosin  (RAPAFLO ) 8 MG CAPS capsule TAKE 1 CAPSULE BY MOUTH DAILY WITH BREAKFAST (Patient taking differently: Take 8 mg by mouth daily.) 90 capsule 3 09/13/2023 at  6:00 PM   sodium chloride  (OCEAN) 0.65 % SOLN nasal spray Place 2 sprays into both nostrils as needed for congestion.   09/14/2023 Morning   tadalafil  (CIALIS ) 5 MG tablet Take 1 tablet by mouth once daily 90 tablet 0 09/14/2023 Morning   SYRINGE-NEEDLE, DISP, 3 ML (  B-D 3CC LUER-LOK SYR 22GX1-1/2) 22G X 1-1/2" 3 ML MISC USE AS DIRECTED WITH TESTOSTERONE  12 each 20    testosterone  cypionate (DEPOTESTOSTERONE CYPIONATE) 200 MG/ML injection Inject 0.3 mLs (60 mg total) into the muscle once a week. DISCARD REMAINING AS THESE ARE SINGLE USE VIALS. (Patient taking differently: Inject 60 mg into the muscle every 14 (fourteen) days. EVERY OTHER SATURDAY. DISCARD REMAINING AS THESE ARE SINGLE USE VIALS.) 5 mL 0    Social History   Socioeconomic History   Marital status: Married    Spouse name: cindy   Number of children: 1   Years of education: Not on file   Highest education level: Professional school degree (e.g., MD, DDS, DVM, JD)  Occupational History   Not on file  Tobacco Use   Smoking status: Never   Smokeless tobacco: Never  Vaping Use   Vaping status: Never Used  Substance and Sexual Activity   Alcohol use: No   Drug use: No   Sexual activity: Not Currently    Birth control/protection: None  Other Topics Concern   Not on file  Social History Narrative   Not on file   Social Drivers of Health   Financial Resource Strain: Not on file  Food Insecurity: No Food Insecurity (09/15/2023)   Hunger Vital Sign    Worried About Running Out of Food in the Last Year: Never true    Ran Out of Food in the Last Year: Never true  Transportation Needs: No Transportation Needs  (09/15/2023)   PRAPARE - Administrator, Civil Service (Medical): No    Lack of Transportation (Non-Medical): No  Physical Activity: Not on file  Stress: Not on file  Social Connections: Patient Unable To Answer (09/15/2023)   Social Connection and Isolation Panel [NHANES]    Frequency of Communication with Friends and Family: Patient unable to answer    Frequency of Social Gatherings with Friends and Family: Patient unable to answer    Attends Religious Services: Patient unable to answer    Active Member of Clubs or Organizations: Patient unable to answer    Attends Banker Meetings: Patient unable to answer    Marital Status: Patient unable to answer  Intimate Partner Violence: Unknown (09/15/2023)   Humiliation, Afraid, Rape, and Kick questionnaire    Fear of Current or Ex-Partner: Patient unable to answer    Emotionally Abused: Patient unable to answer    Physically Abused: Not on file    Sexually Abused: Patient unable to answer    Family History  Problem Relation Age of Onset   Asperger's syndrome Mother      Vitals:   09/28/23 0800 09/28/23 0900 09/28/23 0955 09/28/23 1000  BP: (!) 146/69 (!) 144/100  136/87  Pulse: (!) 104 (!) 111  (!) 121  Resp: 16 14  17   Temp: 98.7 F (37.1 C)  (!) 100.9 F (38.3 C)   TempSrc: Axillary  Axillary   SpO2: 93% 91%  96%  Weight:      Height:        PHYSICAL EXAM General: Chronically ill-appearing elderly male, well nourished, in no acute distress. HEENT: Normocephalic and atraumatic. Neck: No JVD.   Lungs: Trach in place.  Mechanical breath sounds bilaterally. Heart: HRRR. Normal S1 and S2 without gallops or murmurs.  Abdomen: Non-distended appearing.  Msk: Normal strength and tone for age. Extremities: Warm and well perfused. No clubbing, cyanosis. No edema.  Neuro: Alert and oriented X 3. Psych: Answers  questions appropriately.   Labs: Basic Metabolic Panel: Recent Labs    09/27/23 0417  09/28/23 0554  NA 142 142  K 4.3 4.5  CL 109 110  CO2 24 24  GLUCOSE 144* 150*  BUN 30* 35*  CREATININE 0.83 0.95  CALCIUM  8.6* 8.3*  MG 2.1 2.3  PHOS 3.3  --    Liver Function Tests: Recent Labs    09/27/23 0417  ALBUMIN 2.4*    No results for input(s): "LIPASE", "AMYLASE" in the last 72 hours. CBC: Recent Labs    09/27/23 0417 09/28/23 0554  WBC 20.3* 19.3*  HGB 13.0 12.6*  HCT 40.8 38.5*  MCV 98.6 95.8  PLT 429* 454*   Cardiac Enzymes: No results for input(s): "CKTOTAL", "CKMB", "CKMBINDEX", "TROPONINIHS" in the last 72 hours.  BNP: No results for input(s): "BNP" in the last 72 hours.  D-Dimer: No results for input(s): "DDIMER" in the last 72 hours. Hemoglobin A1C: No results for input(s): "HGBA1C" in the last 72 hours. Fasting Lipid Panel: No results for input(s): "CHOL", "HDL", "LDLCALC", "TRIG", "CHOLHDL", "LDLDIRECT" in the last 72 hours.  Thyroid Function Tests: No results for input(s): "TSH", "T4TOTAL", "T3FREE", "THYROIDAB" in the last 72 hours.  Invalid input(s): "FREET3"  Anemia Panel: No results for input(s): "VITAMINB12", "FOLATE", "FERRITIN", "TIBC", "IRON", "RETICCTPCT" in the last 72 hours.   Radiology: [facility] Chest Port 1 View Result Date: 09/28/2023 CLINICAL DATA:  Shortness of breath. EXAM: PORTABLE CHEST 1 VIEW COMPARISON:  09/20/2023. FINDINGS: Low lung volume. There is mild-to-moderate diffuse pulmonary vascular congestion, likely accentuated by low lung volume. There are superimposed linear areas of atelectasis/scarring at the left lung base, similar to the prior study. There is left retrocardiac airspace opacity obscuring the left medial hemidiaphragm and descending thoracic aorta, suggesting combination of left lung atelectasis and/or consolidation. Bilateral costophrenic angles are clear. Tracheostomy tube is seen with its tip at the level of clavicular heads approximately 8 cm above the carina. A Normal cardio-mediastinal silhouette. There  is a left sided 2-lead pacemaker. No acute osseous abnormalities. The soft tissues are within normal limits. IMPRESSION: 1. Mild-to-moderate diffuse pulmonary vascular congestion, likely accentuated by low lung volume. 2. Left retrocardiac airspace opacity, suggesting combination of left lung atelectasis and/or consolidation. Electronically Signed   By: Beula Brunswick M.D.   On: 09/28/2023 08:38   US  Venous Img Upper Uni Left (DVT) Result Date: 09/26/2023 CLINICAL DATA:  Left arm and hand edema EXAM: LEFT UPPER EXTREMITY VENOUS DOPPLER ULTRASOUND TECHNIQUE: Gray-scale sonography with graded compression, as well as color Doppler and duplex ultrasound were performed to evaluate the upper extremity deep venous system from the level of the subclavian vein and including the jugular, axillary, basilic, radial, ulnar and upper cephalic vein. Spectral Doppler was utilized to evaluate flow at rest and with distal augmentation maneuvers. COMPARISON:  None Available. FINDINGS: Contralateral Subclavian Vein: Respiratory phasicity is normal and symmetric with the symptomatic side. No evidence of thrombus. Normal compressibility. Internal Jugular Vein: No evidence of thrombus. Normal compressibility, respiratory phasicity and response to augmentation. Subclavian Vein: No evidence of thrombus. Normal compressibility, respiratory phasicity and response to augmentation. Axillary Vein: Occlusive thrombus is present. Cephalic Vein: No evidence of thrombus. Normal compressibility, respiratory phasicity and response to augmentation. Basilic Vein: Occlusive thrombus is present throughout the basilic vein from the axilla to the antecubital fossa. Brachial Veins: Occlusive thrombus is present throughout both brachial veins from the axilla to the antecubital fossa. Radial Veins: No evidence of thrombus. Normal compressibility, respiratory phasicity and  response to augmentation. Ulnar Veins: No evidence of thrombus. Normal compressibility,  respiratory phasicity and response to augmentation. Venous Reflux:  None visualized. Other Findings:  None visualized. IMPRESSION: Occlusive thrombus is present throughout the left axillary, basilic, and brachial veins from the axilla to the antecubital fossa. These results will be called to the ordering clinician or representative by the Radiologist Assistant, and communication documented in the PACS or [employer]. Electronically Signed   By: Rozell Cornet M.D.   On: 09/26/2023 19:22   CT HEAD WO CONTRAST ( ) Result Date: 09/26/2023 EXAM: CT HEAD WITHOUT CONTRAST 09/26/2023 01:33:00 PM TECHNIQUE: CT of the head was performed without the administration of intravenous contrast. Automated exposure control, iterative reconstruction, and/or weight based adjustment of the mA/kV was utilized to reduce the radiation dose to as low as reasonably achievable. COMPARISON: CT head without contrast 09/14/2023. CT head without contrast 08/15/2022. MR head without contrast 05/23/2014. CLINICAL HISTORY: Neuro deficit, acute, stroke suspected. FINDINGS: BRAIN AND VENTRICLES: There is no acute intracranial hemorrhage, mass effect or midline shift. No abnormal extra-axial fluid collection. The gray-white differentiation is maintained without an acute infarct. There is no hydrocephalus. Chronic encephalomalacia of the anterior frontal and temporal lobes are stable. ORBITS: Bilateral lens replacements and scleral banding is present. SINUSES: Fluid is present in the maxillary sinuses, right greater than left, and in the right sphenoid sinus. The mastoid air cells are clear. SOFT TISSUES AND SKULL: No acute abnormality of the visualized skull or soft tissues. IMPRESSION: 1. No acute intracranial abnormality. 2. Stable chronic encephalomalacia of the anterior frontal and temporal lobes. 3. Acute sinusitis of the maxillary sinuses, right greater than left, and the right sphenoid sinus. Electronically signed by: Audree Leas MD 09/26/2023 05:43 PM EDT RP Workstation: [id number]   US  EKG SITE RITE Result Date: 09/24/2023 If Site Rite image not attached, placement could not be confirmed due to current cardiac rhythm.  IR GASTROSTOMY TUBE MOD SED Result Date: 09/22/2023 INDICATION: 73 year old with respiratory failure and on the ventilator. Request for placement of a gastrostomy tube. EXAM: PERCUTANEOUS GASTROSTOMY TUBE PLACEMENT WITH FLUOROSCOPIC GUIDANCE Physician: Olive Better. Henn, MD MEDICATIONS: Ancef  2 g; Antibiotics were administered within 1 hour of the procedure. Glucagon  1 mg ANESTHESIA/SEDATION: Patient was intubated and on propofol . Patient was monitored by the radiology nurse throughout the procedure. FLUOROSCOPY: Radiation Exposure Index (as provided by the fluoroscopic device): 43 mGy Kerma COMPLICATIONS: None immediate. PROCEDURE: Informed consent was obtained for a percutaneous gastrostomy tube. The patient was placed on the interventional table. Nasogastric tube was already in place. The anterior abdomen was prepped and draped in sterile fashion. Maximal barrier sterile technique was utilized including caps, mask, sterile gowns, sterile gloves, sterile drape, hand hygiene and skin antiseptic. Stomach was insufflated with air through the nasogastric tube. Anterior abdomen was anesthetized using 1% lidocaine . Using fluoroscopic guidance, 2 Saf-T-Pexy T fasteners were deployed within the stomach. Incision was made between the T-fasteners. Needle was directed into the stomach between the T-fasteners using fluoroscopic guidance and a wire was advanced into the stomach. The tract was dilated to accommodate a 20 French peel-away sheath. An 53 French Entuit gastrostomy tube was advanced over the wire and through the peel-away sheath. Peel-away sheath was removed. The balloon was inflated with 10 mL of sterile water . Gastrostomy tube was injected with contrast to confirm placement in the stomach. Fluoroscopic images were  taken and saved for this procedure. FINDINGS: Contrast injection confirms gastrostomy tube in the stomach. IMPRESSION: 1. Successful placement of  a percutaneous gastrostomy tube using fluoroscopic guidance. 2. Plan for removal of the T-fasteners in 10-14 days. Electronically Signed   By: Elene Griffes M.D.   On: 09/22/2023 14:13   DG Chest Port 1 View Result Date: 09/20/2023 CLINICAL DATA:  73 year old male respiratory failure, intubated. EXAM: PORTABLE CHEST 1 VIEW COMPARISON:  Portable chest yesterday and earlier. FINDINGS: Portable AP upright view at 0942 hours. Stable lines and tubes. Left chest pacer/resuscitation pads persist. Stable lung volumes and mediastinal contours. Stable left chest AICD. Stable ventilation since yesterday. No pneumothorax, pulmonary edema. Patchy left greater than right lung base opacity. Paucity of bowel gas in the upper abdomen. Stable visualized osseous structures. IMPRESSION: 1. Stable lines and tubes. 2. Stable ventilation since yesterday. Patchy left greater than right lung base opacity. Electronically Signed   By: Marlise Simpers M.D.   On: 09/20/2023 10:26   DG Chest Port 1 View Result Date: 09/19/2023 CLINICAL DATA:  73 year old male with respiratory failure. EXAM: PORTABLE CHEST 1 VIEW COMPARISON:  Portable AP semi upright view at 0751 hours. FINDINGS: Portable AP semi upright view at 0751 hours. Less rotated to the left now. Pacer and resuscitation pads project over the lower chest. Stable left chest AICD. Endotracheal tube tip remains satisfactory. Stable lines and tubes. Stable low lung volumes, patchy perihilar and lung base opacity. Ventilation is stable with no pneumothorax, pleural effusion identified. And ventilation has improved compared to 09/17/2023. Stable cardiac size and mediastinal contours. Paucity of bowel gas now.  Stable visualized osseous structures. IMPRESSION: 1. Stable lines and tubes. 2. Stable ventilation since yesterday, improved since 09/17/2023. Stable  low lung volumes and patchy perihilar and lung base opacity. Electronically Signed   By: Marlise Simpers M.D.   On: 09/19/2023 08:02   DG Abd 1 View Result Date: 09/18/2023 CLINICAL DATA:  Check gastric catheter placement EXAM: ABDOMEN - 1 VIEW COMPARISON:  09/17/2023 FINDINGS: Gastric catheter is noted coiled within the stomach. No free air is seen. Postsurgical changes in the lower lumbar spine are noted. No obstructive pattern is seen. IMPRESSION: Gastric catheter in the stomach. Electronically Signed   By: Violeta Grey M.D.   On: 09/18/2023 11:18   DG Chest Port 1 View Result Date: 09/18/2023 CLINICAL DATA:  Shortness of breath and chest pain EXAM: PORTABLE CHEST 1 VIEW COMPARISON:  09/17/2023 FINDINGS: Cardiac shadow is stable. Defibrillator is again noted and stable. Endotracheal 2 and gastric catheter are seen. Lungs are well aerated bilaterally. Improved aeration is noted with decrease in edematous changes. Some basilar opacities are again seen bilaterally. IMPRESSION: Improved aeration when compared with the prior exam. Some persistent basilar opacities remain. Electronically Signed   By: Violeta Grey M.D.   On: 09/18/2023 11:09   DG Abd 1 View Result Date: 09/17/2023 CLINICAL DATA:  OG tube placement. EXAM: ABDOMEN - 1 VIEW COMPARISON:  09/14/2023 FINDINGS: OG tube tip is positioned in the mid stomach with proximal side port below the expected location of the GE junction. Nonspecific bowel gas pattern within the visualized abdomen. IMPRESSION: OG tube tip is positioned in the mid stomach. Electronically Signed   By: Donnal Fusi M.D.   On: 09/17/2023 06:42   DG Chest Port 1 View Result Date: 09/17/2023 CLINICAL DATA:  73 year old male intubated, central line placement, bilateral airspace opacity. EXAM: PORTABLE CHEST 1 VIEW COMPARISON:  Portable chest 09/15/2023 and earlier. FINDINGS: Portable AP supine view at 0610 hours. Endotracheal tube projects over the midline trachea in good position between  the clavicles  and carina. Enteric tube has been removed. Stable right IJ approach central line. Stable left chest AICD. Similar lung volumes but diffuse bilateral increased pulmonary opacity with combined reticulonodular and vague/airspace appearance. No pneumothorax or pleural effusion. No air bronchograms. Stable cardiac size and mediastinal contours. No acute osseous abnormality identified. Paucity of bowel gas in the visible abdomen. IMPRESSION: 1. Satisfactory ET tube. Enteric tube removed. Stable right IJ approach central line. 2. Increased bilateral pulmonary opacity from two days ago, differential considerations include progressive pulmonary edema, bilateral infection, ARDS. No pleural effusion is evident. Electronically Signed   By: Marlise Simpers M.D.   On: 09/17/2023 06:36   ECHOCARDIOGRAM COMPLETE Result Date: 09/16/2023    ECHOCARDIOGRAM REPORT   Patient Name:   DR. Hannah Lewis [name] Date of Exam: 09/15/2023 Medical Rec #:  [id number]             Height:       66.0 in Accession #:    [id number]            Weight:       200.6 lb Date of Birth:  05/12/1950             BSA:          2.002 m Patient Age:    73 years              BP:           106/68 mmHg Patient Gender: M                     HR:           77 bpm. Exam Location:  [facility] Procedure: 2D Echo, Cardiac Doppler and Color Doppler (Both Spectral and Color            Flow Doppler were utilized during procedure). Indications:     Elevated Troponin  History:         Patient has no prior history of Echocardiogram examinations.                  CAD, Stroke, Signs/Symptoms:Dyspnea; Risk Factors:Hypertension                  and Dyslipidemia.  Sonographer:     Brigid Canada RDCS Referring Phys:  [id number] Delanna Fears Diagnosing Phys: Sabina Custovic IMPRESSIONS  1. Left ventricular ejection fraction, by estimation, is 35 to 40%. The left ventricle has moderately decreased function. The left ventricle demonstrates regional wall motion abnormalities (see  scoring diagram/findings for description). Left ventricular  diastolic parameters are consistent with Grade I diastolic dysfunction (impaired relaxation).  2. Right ventricular systolic function is normal. The right ventricular size is normal. There is mildly elevated pulmonary artery systolic pressure. The estimated right ventricular systolic pressure is 42.7 mmHg.  3. The mitral valve is normal in structure. Moderate mitral valve regurgitation. No evidence of mitral stenosis.  4. The aortic valve is normal in structure. Aortic valve regurgitation is not visualized. No aortic stenosis is present.  5. The inferior vena cava is normal in size with greater than 50% respiratory variability, suggesting right atrial pressure of 3 mmHg. FINDINGS  Left Ventricle: Left ventricular ejection fraction, by estimation, is 35 to 40%. The left ventricle has moderately decreased function. The left ventricle demonstrates regional wall motion abnormalities. The left ventricular internal cavity size was normal in size. There is no left ventricular hypertrophy. Left ventricular diastolic parameters are consistent with Grade I  diastolic dysfunction (impaired relaxation).  LV Wall Scoring: The antero-lateral wall and apical lateral segment are hypokinetic. Right Ventricle: The right ventricular size is normal. No increase in right ventricular wall thickness. Right ventricular systolic function is normal. There is mildly elevated pulmonary artery systolic pressure. The tricuspid regurgitant velocity is 2.63  m/s, and with an assumed right atrial pressure of 15 mmHg, the estimated right ventricular systolic pressure is 42.7 mmHg. Left Atrium: Left atrial size was normal in size. Right Atrium: Right atrial size was normal in size. Pericardium: There is no evidence of pericardial effusion. Mitral Valve: The mitral valve is normal in structure. Moderate mitral valve regurgitation. No evidence of mitral valve stenosis. Tricuspid Valve: The  tricuspid valve is normal in structure. Tricuspid valve regurgitation is mild. The aortic valve is normal in structure. Aortic valve regurgitation is not visualized. No aortic stenosis is present. Pulmonic Valve: The pulmonic valve was normal in structure. Pulmonic valve regurgitation is not visualized. Aorta: The aortic root is normal in size and structure. Venous: The inferior vena cava is normal in size with greater than 50% respiratory variability, suggesting right atrial pressure of 3 mmHg. IAS/Shunts: No atrial level shunt detected by color flow Doppler.  LEFT VENTRICLE PLAX 2D LVIDd:         5.50 cm   Diastology LVIDs:         4.80 cm   LV e' medial:    6.71 cm/s LV PW:         1.00 cm   LV E/e' medial:  12.9 LV IVS:        1.00 cm   LV e' lateral:   12.83 cm/s LVOT diam:     2.30 cm   LV E/e' lateral: 6.8 LV SV:         63 LV SV Index:   32 LVOT Area:     4.15 cm  RIGHT VENTRICLE             IVC RV Basal diam:  3.70 cm     IVC diam: 2.30 cm RV S prime:     14.50 cm/s TAPSE (M-mode): 2.5 cm LEFT ATRIUM             Index        RIGHT ATRIUM           Index LA diam:        4.70 cm 2.35 cm/m   RA Area:     12.60 cm LA Vol (A2C):   61.3 ml 30.61 ml/m  RA Volume:   31.10 ml  15.53 ml/m LA Vol (A4C):   36.9 ml 18.43 ml/m LA Biplane Vol: 48.5 ml 24.22 ml/m  AORTIC VALVE LVOT Vmax:   82.47 cm/s LVOT Vmean:  54.333 cm/s LVOT VTI:    0.153 m AI PHT:      376 msec  AORTA Ao Root diam: 3.20 cm Ao Asc diam:  3.70 cm MITRAL VALVE               TRICUSPID VALVE MV Area (PHT): 5.14 cm    TR Peak grad:   27.7 mmHg MV Decel Time: 148 msec    TR Vmax:        263.00 cm/s MV E velocity: 86.75 cm/s MV A velocity: 55.30 cm/s  SHUNTS MV E/A ratio:  1.57        Systemic VTI:  0.15 m  Systemic Diam: 2.30 cm Isabell Manzanilla Electronically signed by Isabell Manzanilla Signature Date/Time: 09/16/2023/11:03:59 AM    Final    DG Chest Port 1 View Result Date: 09/15/2023 EXAM: 1 VIEW XRAY OF THE CHEST  09/15/2023 10:35:00 AM COMPARISON: 1 view chest x-ray 05/16/2023 at 2:38 PM. CLINICAL HISTORY: Endotracheally intubated. FINDINGS: LUNGS AND PLEURA: Mild edema and bilateral effusions are similar to the prior study. Bibasilar airspace opacity likely reflects atelectasis. HEART AND MEDIASTINUM: The heart is enlarged. BONES AND SOFT TISSUES: No acute osseous abnormality. LINES AND TUBES: The endotracheal tube is stable, 3.5 cm above the carina. A right IJ line is stable. IMPRESSION: 1. Stable endotracheal tube and right IJ line. 2. Enlarged heart. 3. Mild edema and bilateral effusions, similar to the prior study. 4. Bibasilar airspace opacity, likely atelectasis. Electronically signed by: Audree Leas MD 09/15/2023 01:26 PM EDT RP Workstation: [id number]   DG Chest Port 1 View Result Date: 09/15/2023 CLINICAL DATA:  Central line placement EXAM: PORTABLE CHEST 1 VIEW COMPARISON:  Radiograph and CT 09/14/2023 FINDINGS: Stable cardiomegaly. Pulmonary vascular congestion and bilateral ground-glass opacities. Small right pleural effusion and right basilar airspace opacities. No pneumothorax. Endotracheal tube tip in the intrathoracic trachea 3.0 cm from the carina. Subdiaphragmatic enteric tube. Right IJ CVC tip in the mid SVC. Left chest wall ICD. IMPRESSION: 1. Right IJ CVC tip in the mid SVC. No pneumothorax. 2. Similar findings of congestive heart failure. Electronically Signed   By: Rozell Cornet M.D.   On: 09/15/2023 02:52   CT Angio Chest PE W and/or Wo Contrast Result Date: 09/14/2023 CLINICAL DATA:  Cold symptoms, chest pain, short of breath, abdominal pain EXAM: CT ANGIOGRAPHY CHEST CT ABDOMEN AND PELVIS WITH CONTRAST TECHNIQUE: Multidetector CT imaging of the chest was performed using the standard protocol during bolus administration of intravenous contrast. Multiplanar CT image reconstructions and MIPs were obtained to evaluate the vascular anatomy. Multidetector CT imaging of the abdomen and  pelvis was performed using the standard protocol during bolus administration of intravenous contrast. RADIATION DOSE REDUCTION: This exam was performed according to the departmental dose-optimization program which includes automated exposure control, adjustment of the mA and/or kV according to patient size and/or use of iterative reconstruction technique. CONTRAST:  OMNIPAQUE  IOHEXOL  350 MG/ML SOLN COMPARISON:  01/02/2010, 08/23/2013, 09/14/2023 FINDINGS: CTA CHEST FINDINGS Cardiovascular: This is a technically adequate evaluation of the pulmonary vasculature. No filling defects or pulmonary emboli. Mild cardiomegaly with left ventricular dilatation. No pericardial effusion. Dual lead cardiac pacer, proximal lead in the right atrium and distal lead in the right ventricle. 4.1 cm ascending thoracic aortic aneurysm. No evidence of dissection. Atherosclerosis of the aorta and coronary vasculature. Mediastinum/Nodes: Endotracheal tube terminates just above carina. Enteric catheter extends into the gastric lumen. No pathologic adenopathy. Lungs/Pleura: There is dense bilateral perihilar airspace disease, greatest in the lower lobes. Trace bilateral pleural effusions. No pneumothorax. Central airways are patent. Musculoskeletal: No acute or destructive bony abnormalities. Reconstructed images demonstrate no additional findings. Review of the MIP images confirms the above findings. CT ABDOMEN and PELVIS FINDINGS Hepatobiliary: No focal liver abnormality is seen. No gallstones, gallbladder wall thickening, or biliary dilatation. Pancreas: Unremarkable. No pancreatic ductal dilatation or surrounding inflammatory changes. Spleen: Normal in size without focal abnormality. Adrenals/Urinary Tract: Adrenal glands are unremarkable. Kidneys are normal, without renal calculi, focal lesion, or hydronephrosis. The bladder is decompressed with an indwelling Foley catheter. Stomach/Bowel: No bowel obstruction or ileus. Normal  appendix right lower quadrant. Scattered colonic diverticulosis without evidence of  acute diverticulitis. Enteric catheter tip within the lumen of the gastric body. Vascular/Lymphatic: Aortic atherosclerosis. No enlarged abdominal or pelvic lymph nodes. Reproductive: Stable enlargement of the prostate. Other: No free fluid or free intraperitoneal gas. No abdominal wall hernia. Musculoskeletal: No acute or destructive bony abnormalities. Postsurgical changes from L2-L4 posterior fusion. Reconstructed images demonstrate no additional findings. Review of the MIP images confirms the above findings. IMPRESSION: Chest: 1. No evidence of pulmonary embolus. 2. Dense bilateral perihilar airspace disease, greatest in the lower lobes, which could reflect widespread infection or edema. 3. Trace bilateral pleural effusions. 4. Aortic Atherosclerosis (ICD10-I70.0). Coronary artery atherosclerosis. 5. 4.1 cm ascending thoracic aortic aneurysm. Recommend annual imaging followup by CTA or MRA. This recommendation follows 2010 ACCF/AHA/AATS/ACR/ASA/SCA/SCAI/SIR/STS/SVM Guidelines for the Diagnosis and Management of Patients with Thoracic Aortic Disease. Circulation. 2010; 121: I696-E952. Aortic aneurysm NOS (ICD10-I71.9) Abdomen/pelvis: 1. No acute intra-abdominal or intrapelvic process. Normal appendix. 2. Distal colonic diverticulosis without diverticulitis. 3. Enlarged prostate. 4.  Aortic Atherosclerosis (ICD10-I70.0). Electronically Signed   By: Bobbye Burrow M.D.   On: 09/14/2023 22:40   CT ABDOMEN PELVIS W CONTRAST Result Date: 09/14/2023 CLINICAL DATA:  Cold symptoms, chest pain, short of breath, abdominal pain EXAM: CT ANGIOGRAPHY CHEST CT ABDOMEN AND PELVIS WITH CONTRAST TECHNIQUE: Multidetector CT imaging of the chest was performed using the standard protocol during bolus administration of intravenous contrast. Multiplanar CT image reconstructions and MIPs were obtained to evaluate the vascular anatomy. Multidetector  CT imaging of the abdomen and pelvis was performed using the standard protocol during bolus administration of intravenous contrast. RADIATION DOSE REDUCTION: This exam was performed according to the departmental dose-optimization program which includes automated exposure control, adjustment of the mA and/or kV according to patient size and/or use of iterative reconstruction technique. CONTRAST:  OMNIPAQUE  IOHEXOL  350 MG/ML SOLN COMPARISON:  01/02/2010, 08/23/2013, 09/14/2023 FINDINGS: CTA CHEST FINDINGS Cardiovascular: This is a technically adequate evaluation of the pulmonary vasculature. No filling defects or pulmonary emboli. Mild cardiomegaly with left ventricular dilatation. No pericardial effusion. Dual lead cardiac pacer, proximal lead in the right atrium and distal lead in the right ventricle. 4.1 cm ascending thoracic aortic aneurysm. No evidence of dissection. Atherosclerosis of the aorta and coronary vasculature. Mediastinum/Nodes: Endotracheal tube terminates just above carina. Enteric catheter extends into the gastric lumen. No pathologic adenopathy. Lungs/Pleura: There is dense bilateral perihilar airspace disease, greatest in the lower lobes. Trace bilateral pleural effusions. No pneumothorax. Central airways are patent. Musculoskeletal: No acute or destructive bony abnormalities. Reconstructed images demonstrate no additional findings. Review of the MIP images confirms the above findings. CT ABDOMEN and PELVIS FINDINGS Hepatobiliary: No focal liver abnormality is seen. No gallstones, gallbladder wall thickening, or biliary dilatation. Pancreas: Unremarkable. No pancreatic ductal dilatation or surrounding inflammatory changes. Spleen: Normal in size without focal abnormality. Adrenals/Urinary Tract: Adrenal glands are unremarkable. Kidneys are normal, without renal calculi, focal lesion, or hydronephrosis. The bladder is decompressed with an indwelling Foley catheter. Stomach/Bowel: No bowel  obstruction or ileus. Normal appendix right lower quadrant. Scattered colonic diverticulosis without evidence of acute diverticulitis. Enteric catheter tip within the lumen of the gastric body. Vascular/Lymphatic: Aortic atherosclerosis. No enlarged abdominal or pelvic lymph nodes. Reproductive: Stable enlargement of the prostate. Other: No free fluid or free intraperitoneal gas. No abdominal wall hernia. Musculoskeletal: No acute or destructive bony abnormalities. Postsurgical changes from L2-L4 posterior fusion. Reconstructed images demonstrate no additional findings. Review of the MIP images confirms the above findings. IMPRESSION: Chest: 1. No evidence of pulmonary embolus. 2. Dense  bilateral perihilar airspace disease, greatest in the lower lobes, which could reflect widespread infection or edema. 3. Trace bilateral pleural effusions. 4. Aortic Atherosclerosis (ICD10-I70.0). Coronary artery atherosclerosis. 5. 4.1 cm ascending thoracic aortic aneurysm. Recommend annual imaging followup by CTA or MRA. This recommendation follows 2010 ACCF/AHA/AATS/ACR/ASA/SCA/SCAI/SIR/STS/SVM Guidelines for the Diagnosis and Management of Patients with Thoracic Aortic Disease. Circulation. 2010; 121: [id number]. Aortic aneurysm NOS (ICD10-I71.9) Abdomen/pelvis: 1. No acute intra-abdominal or intrapelvic process. Normal appendix. 2. Distal colonic diverticulosis without diverticulitis. 3. Enlarged prostate. 4.  Aortic Atherosclerosis (ICD10-I70.0). Electronically Signed   By: Bobbye Burrow M.D.   On: 09/14/2023 22:40   CT HEAD WO CONTRAST ( ) Result Date: 09/14/2023 CLINICAL DATA:  Head trauma, minor (Age >= 65y) EXAM: CT HEAD WITHOUT CONTRAST TECHNIQUE: Contiguous axial images were obtained from the base of the skull through the vertex without intravenous contrast. RADIATION DOSE REDUCTION: This exam was performed according to the departmental dose-optimization program which includes automated exposure control, adjustment  of the mA and/or kV according to patient size and/or use of iterative reconstruction technique. COMPARISON:  CT head 08/15/2022 FINDINGS: Brain: Bilateral anterior inferior frontal lobe and bilateral anterior temporal lobe encephalomalacia. Left cerebellar encephalomalacia. No evidence of large-territorial acute infarction. No parenchymal hemorrhage. No mass lesion. No extra-axial collection. No mass effect or midline shift. No hydrocephalus. Basilar cisterns are patent. Vascular: No hyperdense vessel. Atherosclerotic calcifications are present within the cavernous internal carotid and vertebral arteries. Skull: No acute fracture or focal lesion. Old left occipital skull fracture. Sinuses/Orbits: Paranasal sinuses and mastoid air cells are clear. Bilateral lens replacement. Bilateral scleral buckle. Otherwise the orbits are unremarkable. Other: Partially visualized endotracheal tube. IMPRESSION: No acute intracranial abnormality. Electronically Signed   By: Morgane  Naveau M.D.   On: 09/14/2023 22:36   DG Abd Portable 1 View Result Date: 09/14/2023 CLINICAL DATA:  Enteric catheter placement EXAM: PORTABLE ABDOMEN - 1 VIEW COMPARISON:  None Available. FINDINGS: Frontal view of the lower chest and upper abdomen demonstrates enteric catheter passing below diaphragm tip projecting over gastric body. Interstitial and ground-glass opacities throughout the lungs consistent with edema. Unremarkable bowel gas pattern. IMPRESSION: 1. Enteric catheter tip projects over gastric body. Electronically Signed   By: Bobbye Burrow M.D.   On: 09/14/2023 20:55   DG Chest Port 1 View Result Date: 09/14/2023 CLINICAL DATA:  Intubated, CHF, tachypnea EXAM: PORTABLE CHEST 1 VIEW COMPARISON:  09/14/2023 FINDINGS: Single frontal view of the chest demonstrates endotracheal tube overlying tracheal air column, tip of proximally 2 cm above carina. Dual lead pacemaker/AICD unchanged. Cardiac silhouette remains mildly enlarged. Stable  interstitial and ground-glass opacities throughout the lungs. No large effusion or pneumothorax. No acute bony abnormalities. IMPRESSION: 1. No complication after intubation. 2. Stable findings of congestive heart failure. Electronically Signed   By: Bobbye Burrow M.D.   On: 09/14/2023 20:55   DG Chest Port 1 View Result Date: 09/14/2023 CLINICAL DATA:  Tachypnea.  CHF EXAM: PORTABLE CHEST 1 VIEW COMPARISON:  X-ray 01/14/2022 and older FINDINGS: Left upper chest battery pack for defibrillator with leads along the right side of the heart. Stable cardiopericardial silhouette. Tortuous aorta. Increasing vascular congestion and component of possible edema. No pneumothorax or effusion. No consolidation. Degenerative changes along the spine. Overlapping cardiac leads. IMPRESSION: Increasing vascular congestion and interstitial changes, possible edema. Defibrillator. Electronically Signed   By: Adrianna Horde M.D.   On: 09/14/2023 18:20    ECHO as above  TELEMETRY reviewed by me 09/28/2023: Sinus rhythm, rate 70s. (No further VT  on tele)    EKG reviewed by me: Sinus tachycardia rate 144 bpm with ST elevation V2/V3. Repeat EKG ST changes resolved.   Data reviewed by me 09/28/2023: last 24h vitals tele labs imaging I/O critical care team notes, hospitalist progress note.  Principal Problem:   Acute respiratory failure with hypoxia (HCC) Active Problems:   Sepsis (HCC)   Altered mental status   HFrEF (heart failure with reduced ejection fraction) (HCC)   Non-ST elevation MI (NSTEMI) (HCC)   Aspiration pneumonia of both lower lobes (HCC)    ASSESSMENT AND PLAN:  Blake [name] is a 73 y.o. male  with a past medical history of coronary artery disease s/p stent to LAD, ischemic cardiomyopathy s/p ICD ([employer]), chronic HFrEF, OSA (on CPAP), hx CVA who presented to the ED on 09/14/2023 for dyspnea, chest pain and AMS. Patient recently seen at urgent care for nonproductive cough, chills/diaphoresis  Troponins found to be elevated.  Cardiology was consulted for further evaluation  # NSTEMI # Acute hypoxic respiratory failure  # Acute metabolic encephalopathy  # Sepsis # Acute on chronic HFrEF # Ischemic cardiomyopathy s/p ICD # Nonsustained VT Patient presented to ED with chest pain, dyspnea. Patient intubated for airway protection. BNP elevated at 570.  Troponins elevated and trending 100 > 7400 > 15300 > 15200. Sinus tachycardia rate 144 bpm with ST elevation V2/V3. Repeat EKG ST changes resolved.  Echo this admission with  rEF (35-40%), with anterolateral wall and apical lateral segment hypokinesis (unchanged from prior echo in 2020). Patient intubated and off Levophed  (05/30). BNP elevated 670 > 960. CXR with significant pulmonary edema. VT noted per tele on 05/30 and amio gtt started. Patient appears euvolemic. Per tele no evidence of further VT. PEG placed (06/03) and trach placement (06/04) due to unable to wean from ventilator.  -Completed 48 hrs of heparin  gtt for medical management of NSTEMI.   -Monitor and replenish electrolytes for a goal K >4, Mag >2. -Continue PO amio 400 mg daily per tube.  -Continue aspirin  81 mg, atorvastatin  40 mg per tube daily. -Continue spironolactone  25 mg daily. -Continue metoprolol  tartrate 50 mg twice daily. May uptitrate for elevated HR if BP allows. -Continue losartan  50 mg daily. -Continue lasix  20 mg daily. -Will not initiate SGLT2i at this time given risk of infection. -No plan for LHC as this will not change patient's management. Continue medical management and optimizing GDMT. Discussed plan with patient's wife and kids who are agreeable. -Acute hypoxic respiratory failure, acute metabolic encephalopathy, sepsis management per primary. -Patient awaiting LTAC  No further cardiac recommendations at this time. Cardiology will sign off. Please haiku with questions or re-engage if needed.   This patient's plan of care was discussed and created  with Dr. Custovic and she is in agreement.  Signed: Creighton Doffing, PA-C  09/28/2023, 10:24 AM [facility] Cardiology

## 2023-09-28 NOTE — TOC Progression Note (Signed)
 Transition of Care [facility]) - Progression Note    Patient Details  Name: Blake [name] MRN: [id number] Date of Birth: 06-24-1950  [facility] CM/SW Contact  Baird Bombard, RN Phone Number: 09/28/2023, 10:17 AM  Clinical Narrative:    Attempt to reach [place] from [facility]. No answer. Unable to leave a message.  Message sent requesting return call regarding bed availability.    Expected Discharge Plan:  (TBD) Barriers to Discharge:  (TBD)  Expected Discharge Plan and Services   Discharge Planning Services: CM Consult   Living arrangements for the past 2 months: Single Family Home Expected Discharge Date: 09/25/23                                     Social Determinants of Health (SDOH) Interventions SDOH Screenings   Food Insecurity: No Food Insecurity (09/15/2023)  Housing: Low Risk  (09/15/2023)  Transportation Needs: No Transportation Needs (09/15/2023)  Utilities: Not At Risk (09/15/2023)  Depression (PHQ2-9): Low Risk  (08/04/2023)  Social Connections: Patient Unable To Answer (09/15/2023)  Tobacco Use: Low Risk  (09/20/2023)    Readmission Risk Interventions     No data to display

## 2023-09-28 NOTE — Progress Notes (Signed)
 PHARMACY - ANTICOAGULATION CONSULT NOTE  Pharmacy Consult for Heparin   Indication: DVT  Allergies  Allergen Reactions   Promethazine Other (See Comments)    Severe neuroleptic malignant syndrome requiring intubation   Mirtazapine Other (See Comments)    Other reaction(s): Other (See Comments) Irritability/anger, increased appetite, worsening depression Irritability/anger, increased appetite, worsening depression     Patient Measurements: Height: 5\' 6"  (167.6 cm) Weight: 83.6 kg (184 lb 4.9 oz) IBW/kg (Calculated) : 63.8 HEPARIN  DW (KG): 81  Vital Signs: Temp: 99.7 F (37.6 C) (06/09 1520) Temp Source: Axillary (06/09 1520) BP: 125/67 (06/09 1515) Pulse Rate: 81 (06/09 1515)  Labs: Recent Labs    09/26/23 0508 09/26/23 0554 09/26/23 0554 09/27/23 0417 09/27/23 2124 09/28/23 0554 09/28/23 0555 09/28/23 1258 09/28/23 1449  HGB  --  13.3   < > 13.0  --  12.6*  --   --   --   HCT  --  41.7  --  40.8  --  38.5*  --   --   --   PLT  --  379  --  429*  --  454*  --   --   --   HEPARINUNFRC  --   --   --   --  0.18*  --  0.16*  --  0.49  CREATININE 0.76  --   --  0.83  --  0.95  --   --   --   TROPONINIHS  --   --   --   --   --   --   --  83*  --    < > = values in this interval not displayed.    Estimated Creatinine Clearance: 68.1 mL/min (by C-G formula based on SCr of 0.95 mg/dL).   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   Coronary artery disease    Dementia (HCC)    Depression    Disorder of male genital organs 07/11/2013   Dyspnea    Encounter for long-term (current) use of  medications 11/21/2014   Erectile dysfunction 07/11/2013   Executive function deficit 03/06/2016   GERD (gastroesophageal reflux disease)    Headache    stress   Hearing loss    Hyperlipidemia    Hypertension    Hypogonadism, 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   Major depression, recurrent, chronic (HCC) 03/06/2016   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  No prior anticoag noted. CrCl = 78.1 ml/min   Goal of Therapy:  Heparin  level 0.3-0.7 units/ml Monitor platelets by anticoagulation protocol: Yes   Plan:  6/9:  HL @ 1449 = 0.49, therapeutic x 1 - continue heparin  drip rate of 1900 units/hr. - will check confirmatory HL in 8 hrs. - CBC daily   Blake [name] Blake [name], PharmD Clinical Pharmacist 09/28/2023 3:33 PM

## 2023-09-29 LAB — BASIC METABOLIC PANEL WITH GFR
Anion gap: 8 (ref 5–15)
BUN: 33 mg/dL — ABNORMAL HIGH (ref 8–23)
CO2: 28 mmol/L (ref 22–32)
Calcium: 8 mg/dL — ABNORMAL LOW (ref 8.9–10.3)
Chloride: 106 mmol/L (ref 98–111)
Creatinine, Ser: 0.88 mg/dL (ref 0.61–1.24)
GFR, Estimated: >60 mL/min (ref >60)
Glucose, Bld: 127 mg/dL — ABNORMAL HIGH (ref 70–99)
Potassium: 4 mmol/L (ref 3.5–5.1)
Sodium: 142 mmol/L (ref 135–145)

## 2023-09-29 LAB — GLUCOSE, CAPILLARY
Glucose-Capillary: 115 mg/dL — ABNORMAL HIGH (ref 70–99)
Glucose-Capillary: 101 mg/dL — ABNORMAL HIGH (ref 70–99)
Glucose-Capillary: 122 mg/dL — ABNORMAL HIGH (ref 70–99)
Glucose-Capillary: 118 mg/dL — ABNORMAL HIGH (ref 70–99)

## 2023-09-29 LAB — MAGNESIUM: Magnesium: 2.3 mg/dL (ref 1.7–2.4)

## 2023-09-29 LAB — CBC
HCT: 34.6 % — ABNORMAL LOW (ref 39.0–52.0)
Hemoglobin: 11.2 g/dL — ABNORMAL LOW (ref 13.0–17.0)
MCH: 31.8 pg (ref 26.0–34.0)
MCHC: 32.4 g/dL (ref 30.0–36.0)
MCV: 98.3 fL (ref 80.0–100.0)
Platelets: 423 10*3/uL — ABNORMAL HIGH (ref 150–400)
RBC: 3.52 MIL/uL — ABNORMAL LOW (ref 4.22–5.81)
RDW: 13.7 % (ref 11.5–15.5)
WBC: 13.7 10*3/uL — ABNORMAL HIGH (ref 4.0–10.5)
nRBC: 0 % (ref 0.0–0.2)

## 2023-09-29 LAB — HEPARIN LEVEL (UNFRACTIONATED)
Heparin Unfractionated: 0.43 [IU]/mL (ref 0.30–0.70)
Heparin Unfractionated: 0.51 [IU]/mL (ref 0.30–0.70)

## 2023-09-29 MED ORDER — SODIUM CHLORIDE 0.9 % IV BOLUS
500.0000 mL | Freq: Once | INTRAVENOUS | Status: AC
  Administered 2023-09-29: 500 mL via INTRAVENOUS

## 2023-09-29 MED ORDER — FUROSEMIDE 20 MG PO TABS: 20.0000 mg | ORAL_TABLET | Freq: Every day | ORAL | Status: AC

## 2023-09-29 MED ORDER — LOSARTAN POTASSIUM 50 MG PO TABS: 50.0000 mg | ORAL_TABLET | Freq: Every day | ORAL | Status: AC

## 2023-09-29 MED ORDER — ORAL CARE MOUTH RINSE: 15.0000 mL | OROMUCOSAL | Status: AC | PRN

## 2023-09-29 MED ORDER — OXYCODONE HCL 5 MG PO TABS: 10.0000 mg | ORAL_TABLET | Freq: Three times a day (TID) | ORAL | Status: DC

## 2023-09-29 MED ORDER — SPIRONOLACTONE 25 MG PO TABS: 25.0000 mg | ORAL_TABLET | Freq: Every day | ORAL | Status: AC

## 2023-09-29 MED ORDER — HEPARIN (PORCINE) 25000 UT/250ML-% IV SOLN: 1900.0000 [IU]/h | INTRAVENOUS | Status: AC

## 2023-09-29 MED ORDER — PIPERACILLIN-TAZOBACTAM 3.375 G IVPB: 3.3750 g | Freq: Three times a day (TID) | INTRAVENOUS | Status: AC

## 2023-09-29 MED ORDER — FLUOXETINE HCL 20 MG PO CAPS: 20.0000 mg | ORAL_CAPSULE | Freq: Every day | ORAL | Status: DC

## 2023-09-29 MED ORDER — QUETIAPINE FUMARATE 25 MG PO TABS: 25.0000 mg | ORAL_TABLET | Freq: Every day | ORAL | Status: DC

## 2023-09-29 MED ORDER — METOPROLOL TARTRATE 50 MG PO TABS: 50.0000 mg | ORAL_TABLET | Freq: Two times a day (BID) | ORAL | Status: AC

## 2023-09-29 MED ORDER — FENTANYL CITRATE PF 50 MCG/ML IJ SOSY
50.0000 ug | PREFILLED_SYRINGE | INTRAMUSCULAR | Status: AC | PRN
Start: 2023-09-29 — End: ?

## 2023-09-29 MED ORDER — OXYCODONE HCL 10 MG PO TABS: 10.0000 mg | ORAL_TABLET | Freq: Three times a day (TID) | ORAL | Status: DC

## 2023-09-29 NOTE — Progress Notes (Signed)
 Report called to Jillian Mott RN at [facility].

## 2023-09-29 NOTE — Progress Notes (Signed)
 PHARMACY - ANTICOAGULATION CONSULT NOTE  Pharmacy Consult for Heparin   Indication: DVT  Allergies  Allergen Reactions   Promethazine Other (See Comments)    Severe neuroleptic malignant syndrome requiring intubation   Mirtazapine Other (See Comments)    Other reaction(s): Other (See Comments) Irritability/anger, increased appetite, worsening depression Irritability/anger, increased appetite, worsening depression     Patient Measurements: Height: 5\' 6"  (167.6 cm) Weight: 84.8 kg (186 lb 15.2 oz) IBW/kg (Calculated) : 63.8 HEPARIN  DW (KG): 81  Vital Signs: Temp: 99.6 F (37.6 C) (06/10 0400) Temp Source: Axillary (06/10 0400) BP: 141/68 (06/10 0430) Pulse Rate: 82 (06/10 0430)  Labs: Recent Labs    09/27/23 0417 09/27/23 2124 09/28/23 0554 09/28/23 0555 09/28/23 1258 09/28/23 1448 09/28/23 1449 09/28/23 2345 09/29/23 0532  HGB 13.0  --  12.6*  --   --   --   --   --  11.2*  HCT 40.8  --  38.5*  --   --   --   --   --  34.6*  PLT 429*  --  454*  --   --   --   --   --  423*  HEPARINUNFRC  --    < >  --    < >  --   --  0.49 0.51 0.43  CREATININE 0.83  --  0.95  --   --   --   --   --  0.88  TROPONINIHS  --   --   --   --  83* 84*  --   --   --    < > = values in this interval not displayed.    Estimated Creatinine Clearance: 74.1 mL/min (by C-G formula based on SCr of 0.88 mg/dL).   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   Coronary artery disease    Dementia (HCC)    Depression    Disorder of male genital organs 07/11/2013   Dyspnea    Encounter for long-term (current) use of  medications 11/21/2014   Erectile dysfunction 07/11/2013   Executive function deficit 03/06/2016   GERD (gastroesophageal reflux disease)    Headache    stress   Hearing loss    Hyperlipidemia    Hypertension    Hypogonadism, 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   Major depression, recurrent, chronic (HCC) 03/06/2016   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  No prior anticoag noted. CrCl = 78.1 ml/min   Goal of Therapy:  Heparin  level 0.3-0.7 units/ml Monitor platelets by anticoagulation protocol: Yes   Plan:  6/10:  HL @ 0532 = 0.43, therapeutic x 3 - continue heparin  drip rate of 1900 units/hr. - will recheck HL daily w/ AM labs while therapeutic. - CBC daily   Coretta Dexter, PharmD, [facility] 09/29/2023 6:20 AM

## 2023-09-29 NOTE — Plan of Care (Signed)
  Problem: Health Behavior/Discharge Planning: Goal: Ability to manage health-related needs will improve Outcome: Progressing   Problem: Clinical Measurements: Goal: Ability to maintain clinical measurements within normal limits will improve Outcome: Progressing Goal: Will remain free from infection Outcome: Progressing Goal: Diagnostic test results will improve Outcome: Progressing Goal: Respiratory complications will improve Outcome: Progressing   Problem: Pain Managment: Goal: General experience of comfort will improve and/or be controlled Outcome: Progressing   Problem: Skin Integrity: Goal: Risk for impaired skin integrity will decrease Outcome: Progressing   Problem: Respiratory: Goal: Ability to maintain a clear airway and adequate ventilation will improve Outcome: Progressing

## 2023-09-29 NOTE — Discharge Summary (Signed)
 NAME:  DEMARKUS REMMEL, MRN:  [id number], DOB:  1950/11/03, LOS: 15 ADMISSION DATE:  09/14/2023  Brief hospital course Case of a 73 year old male patient with a past medical history of CAD status post PCI to the LAD complicated by ischemic cardiomyopathy status post ICD placement, OSA on CPAP who presented to [facility] on 05/26 with altered mental status and appears to have multifocal pneumonia with course complicated by ARDS now status post tracheostomy tube placement on 06/04 and gastrostomy tube placement by IR on 06/03.  Course complicated by DVT of the left axillary basilic and brachial veins. Now course c/b by recurrent VAP with Klebsiella pneumonia and zosyn  restarted.   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 and fell off, then he got back onto the horse and fell again on asphalt), HFrEF, ischemic cardiomyopathy, Hyperlipidemia, SVT, CAD, Depression, GERD, Hearing loss of both ears, MI, CVA, Hypertension, Hypotestosteronemia, ICD in place [facility] [employer] D433 DEFIBRILLATOR, RESONATE EL DR DF4 S/N: [id number] implanted 11/21/2021), and OSA on CPAP   Significant Hospital Events: Including procedures, antibiotic start and stop dates in addition to other pertinent events   5/26: Admitted to ICU with acute altered mental status and respiratory failure in the setting of suspected NMS versus sepsis requiring intubation 5/27: fever improved, continued on vasopressors 5/28: extubated in the afternoon. Respiratory failure overnight, re-intubated 5/29: vented, increased oxygen requirements. Family at the bedside. Heart failure consult placed 5/30: vent requirements improved, diuresing, sedated. Reina Cara with SBT 5/31: remains vented, WUA this AM 6/01: vented, sedated, afebrile 6/2 remains on vent 6/3 remains on vent, TRACH PEG TUBE  pending 6/4 s/p trach 6/5 tolerated PS 5/5 6/6 tolerated PS 5/5 6/7 tolerated PS 5/5, US  +left arm Thrombus 6/10 Patient back on a rate but minimal settings plan to continue weaning to PSV.   Interim History / Subjective:  Patient calm this am. Does not appear as uncomfortable as yesterday. Afebrile for the last 12 hours.   Objective    Blood pressure 113/68, pulse 69, temperature 98 F (36.7 C), temperature source Axillary, resp. rate 15, height 5\' 6"  (1.676 m), weight 84.8 kg, SpO2 100%.    Vent Mode: PCV FiO2 (%):  [40 %-50 %] 40 % Set Rate:  [15 bmp] 15 bmp PEEP:  [10 cmH20] 10 cmH20 Pressure Support:  [20 cmH20] 20 cmH20   Intake/Output Summary (Last 24 hours) at 09/29/2023 1342 Last data filed at 09/29/2023 1315 Gross per 24 hour  Intake 2683.46 ml  Output 1040 ml  Net 1643.46 ml   Filed Weights   09/27/23 0434 09/28/23 0433 09/29/23 0500  Weight: 83.9 kg 83.6 kg 84.8 kg    Examination: General: Trached and Peg, appears uncomfortable  HENT: Supple neck, reactive pupils  Lungs: Coarse breath sounds bilaterally Cardiovascular: Normal S1, Normal S2, RRR  Abdomen: Soft, non tender, non distended  Extremities: Warm and well perfused, no edema.   Labs and imaging were reviewed/   Assessment and Plan  Case of a 73 year old male patient with a past medical history of CAD status post PCI to the LAD complicated by ischemic cardiomyopathy status post ICD placement, OSA on CPAP who presented to [facility] on 05/26 with altered mental status and appears to have multifocal pneumonia with course complicated by ARDS now status post tracheostomy tube placement on 06/04 and gastrostomy tube placement by IR on 06/03.  Course complicated by DVT of the left axillary basilic and brachial veins.  # Acute hypoxic respiratory failure status post trach on 06/04 and PEG on 06/03 secondary to... # ARDS in the setting of multifocal pneumonia with course complicated by # DVT of the left upper  extremity now on heparin  drip # Now with persistent fever, sputum production and sputum culture growing Klebsiella pneumonia improving.   Neuro: oxycodone  to 10 mg every 8 hours. Seroquel  25 mgQHS.  Continue Prozac 20 mg daily.  Continue with valproic  acid to 50 mg 3 times per day. CVS: Agree with losartan  50 mg daily, atorvastatin  40 mg daily and spironolactone  25 mg daily. Lopressor  50 bid.  Lungs/ID: Restart Zosyn  to complete 7-day course (06/09 -). Renal: Monitor UOP holding off on diuresis for now. Heme: On heparin  drip Endo: POC goal 140-180  Ok for transfer to [facility].  Best Practice (right click and "Reselect all SmartList Selections" daily)   Diet/type: tubefeeds DVT prophylaxis systemic heparin  Pressure ulcer(s): N/A GI prophylaxis: PPI Lines: Central line Foley:  removal ordered  Code Status:  full code  Last date of multidisciplinary goals of care discussion [09/29/2023]  Critical care time: 40 minutes     Annitta Kindler, MD [facility] 09/29/2023 1:42 PM

## 2023-09-29 NOTE — TOC Transition Note (Signed)
 Transition of Care [facility]) - Discharge Note   Patient Details  Name: Blake [name] MRN: [id number] Date of Birth: 1950/08/02  Transition of Care [facility]) CM/SW Contact:  Saniyyah Elster A Jerris Keltz, RN Phone Number: 09/29/2023, 12:36 PM   Clinical Narrative:    Chart reviewed.  Noted that patient has orders for discharge today.  I have spoken with Sherline Distel with [facility].  Sherline Distel informs me that she has an ICU bed for patient today.  Sherline Distel reports that patient will go to room 415.  Accepting provider is Dr. Luberta Ruse. The number to call report is [phone].   I have informed Blake [name] of the above information.    I have informed staff nurse of the above information.  I have made staff nurse aware that once patient is ready for transport to connect with [facility] to transport patient to facility today.     Final next level of care: Long Term Acute Care (LTAC) [facility]) Barriers to Discharge: No Barriers Identified   Patient Goals and CMS Choice   CMS Medicare.gov Compare Post Acute Care list provided to:: Patient Represenative (must comment) ([name] Blake [name]) Choice offered to / list presented to : Spouse      Discharge Placement                Patient to be transferred to facility by: [facility] Name of family member notified: Blake [name] Patient and family notified of of transfer: 09/29/23  Discharge Plan and Services Additional resources added to the After Visit Summary for     Discharge Planning Services: CM Consult                                 Social Drivers of Health (SDOH) Interventions SDOH Screenings   Food Insecurity: No Food Insecurity (09/15/2023)  Housing: Low Risk  (09/15/2023)  Transportation Needs: No Transportation Needs (09/15/2023)  Utilities: Not At Risk (09/15/2023)  Depression (PHQ2-9): Low Risk  (08/04/2023)  Social Connections: Patient Unable To Answer (09/15/2023)  Tobacco Use: Low Risk  (09/20/2023)      Readmission Risk Interventions     No data to display

## 2023-09-29 NOTE — Progress Notes (Signed)
 Transportation to [facility] facility arranged via [employer]. Clinical information given, and this RN given estimated time of approx 9 pm for transport. [facility] to contact us  for updated time once available. Wife updated.

## 2023-09-29 NOTE — Progress Notes (Signed)
 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   Per ed reports, and patient's wife who is at the bedside, Patient was seen at the urgent care for chief complaints of chills, body aches, intermittently and nonproductive coughing fits. He was diagnosed with acute upper respiratory infection and was sent home with promethazine-dextromethorphan and cefdinir (OMNICEF). [name] report that he took the prescription and went to bed. Later he woke up altered and delusional and c/o CP and SOB. He was noted to be sweating profusely with abnormal body temp per wife.   ED Course: Initial vital signs showed HR >180 beats/minute, BP >160 mm Hg, the RR 30s-40s breaths/minute, and the oxygen saturation 80s% on NRB and a temperature of 103.42F (39.6C).    Pertinent Labs/Diagnostics Findings: Na+/ K+:138/3.9  Glucose:127 CO2 21 WBC: unremarkable Lactic acid: 1.5~2.6 CK: COVID PCR: Negative troponin:106~7453   BNP:573.5  VBG: pO2 pend; pCO2 37; pH 7.49;  HCO3 28.2, %O2 Sat 43.7.  CXR> CTH> CTA Chest> CT Abd/pelvis>see report   Patient's agitation and restlessness worsen with HR up in the 170s to 180s.  2 mg of Ativan  was trialed but patient did not calm down. Given worsening symptoms and  high risk for decompensation, patient intubated for airway protection. He was started on broad-spectrum antibiotics Ampicillin , vanc, Ceftriaxone  and Acyclovir  due to unclear source for possible infection and cover broadly to include treatment for meningitis. PCCM consulted for admission.  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 and fell off, then he got back onto the horse and fell again on asphalt), HFrEF, ischemic cardiomyopathy, Hyperlipidemia, SVT, CAD, Depression, GERD, Hearing loss of both ears, MI, CVA, Hypertension, Hypotestosteronemia, ICD in place [facility] [employer] D433 DEFIBRILLATOR, RESONATE EL DR DF4 S/N: [id number] implanted 11/21/2021), and OSA on CPAP   Significant Hospital Events: Including procedures, antibiotic start and stop dates in addition to other pertinent events   5/26: Admitted to ICU with acute altered mental status and respiratory failure in the setting of suspected NMS versus sepsis requiring intubation 5/27: fever improved, continued on vasopressors 5/28: extubated in the afternoon. Respiratory failure overnight, re-intubated 5/29: vented, increased oxygen requirements. Family at the bedside. Heart failure consult placed 5/30: vent requirements improved, diuresing, sedated. Reina Cara with SBT 5/31: remains vented, WUA this AM 6/01: vented, sedated, afebrile 6/2 remains on vent 6/3 remains on vent, TRACH PEG TUBE pending 6/4 s/p trach 6/5 tolerated PS 5/5 6/6 tolerated PS 5/5 6/7 tolerated PS 5/5, US  +left arm Thrombus  Interim History / Subjective:  Patient calm this am. Does not appear as uncomfortable as yesterday. Afebrile for the last 12 hours.   Objective    Blood pressure 113/68, pulse 69, temperature 98 F (36.7  C), temperature source Axillary, resp. rate 15, height 5\' 6"  (1.676 m), weight 84.8  kg, SpO2 100%.    Vent Mode: PCV FiO2 (%):  [40 %-60 %] 40 % Set Rate:  [15 bmp] 15 bmp PEEP:  [10 cmH20] 10 cmH20 Pressure Support:  [20 cmH20] 20 cmH20   Intake/Output Summary (Last 24 hours) at 09/29/2023 1237 Last data filed at 09/29/2023 1157 Gross per 24 hour  Intake 2537.24 ml  Output 995 ml  Net 1542.24 ml   Filed Weights   09/27/23 0434 09/28/23 0433 09/29/23 0500  Weight: 83.9 kg 83.6 kg 84.8 kg    Examination: General: Trached and Peg, appears uncomfortable  HENT: Supple neck, reactive pupils  Lungs: Coarse breath sounds bilaterally Cardiovascular: Normal S1, Normal S2, RRR  Abdomen: Soft, non tender, non distended  Extremities: Warm and well perfused, no edema.   Labs and imaging were reviewed/   Assessment and Plan  Case of a 73 year old male patient with a past medical history of CAD status post PCI to the LAD complicated by ischemic cardiomyopathy status post ICD placement, OSA on CPAP who presented to [facility] on 05/26 with altered mental status and appears to have multifocal pneumonia with course complicated by ARDS now status post tracheostomy tube placement on 06/04 and gastrostomy tube placement by IR on 06/03.  Course complicated by DVT of the left axillary basilic and brachial veins.  # Acute hypoxic respiratory failure status post trach on 06/04 and PEG on 06/03 secondary to... # ARDS in the setting of multifocal pneumonia with course complicated by # DVT of the left upper extremity now on heparin  drip # Now with persistent fever, sputum production and sputum culture growing Klebsiella pneumonia improving.   Neuro: oxycodone  to 10 mg every 8 hours. Seroquel  25 mgQHS.  Continue Prozac 20 mg daily.  Continue with valproic  acid to 50 mg 3 times per day. CVS: Agree with losartan  50 mg daily, atorvastatin  40 mg daily and spironolactone  25 mg daily. Lopressor  50 bid.  Lungs/ID: Restart Zosyn  to complete 7-day course (06/09 -). Renal: Monitor UOP holding off on  diuresis for now. Heme: On heparin  drip Endo: POC goal 140-180  Ok for transfer to [facility].  Best Practice (right click and "Reselect all SmartList Selections" daily)   Diet/type: tubefeeds DVT prophylaxis systemic heparin  Pressure ulcer(s): N/A GI prophylaxis: PPI Lines: Central line Foley:  removal ordered  Code Status:  full code  Last date of multidisciplinary goals of care discussion [09/29/2023]  Critical care time: 40 minutes     Annitta Kindler, MD [facility] 09/29/2023 12:37 PM

## 2023-09-29 NOTE — Progress Notes (Signed)
 Pt transferred onto stretcher in the care of [facility] staff, to be transported to [facility] facility.

## 2023-09-29 NOTE — Progress Notes (Addendum)
 [facility] at bedside to transport patient. Hand-off given to RN, Rannie Byars. Tube feeds stopped and Gtube flushed with 30 H20. Pt agitated after having loose BM; given Fentanyl  50 mcg for pain and agitation per CPOT score. Wife at bedside. On-coming shift RN also at bedside and updated as well.

## 2023-09-30 DIAGNOSIS — J9621 Acute and chronic respiratory failure with hypoxia: Secondary | ICD-10-CM

## 2023-09-30 DIAGNOSIS — S06301A Unspecified focal traumatic brain injury with loss of consciousness of 30 minutes or less, initial encounter: Secondary | ICD-10-CM

## 2023-09-30 DIAGNOSIS — Z930 Tracheostomy status: Secondary | ICD-10-CM

## 2023-09-30 DIAGNOSIS — J189 Pneumonia, unspecified organism: Secondary | ICD-10-CM

## 2023-10-01 DIAGNOSIS — Z930 Tracheostomy status: Secondary | ICD-10-CM

## 2023-10-01 DIAGNOSIS — L309 Dermatitis, unspecified: Secondary | ICD-10-CM | POA: Diagnosis not present

## 2023-10-01 DIAGNOSIS — J189 Pneumonia, unspecified organism: Secondary | ICD-10-CM

## 2023-10-01 DIAGNOSIS — I82499 Acute embolism and thrombosis of other specified deep vein of unspecified lower extremity: Secondary | ICD-10-CM | POA: Diagnosis not present

## 2023-10-01 DIAGNOSIS — I5020 Unspecified systolic (congestive) heart failure: Secondary | ICD-10-CM | POA: Diagnosis not present

## 2023-10-01 DIAGNOSIS — J9621 Acute and chronic respiratory failure with hypoxia: Secondary | ICD-10-CM

## 2023-10-01 DIAGNOSIS — S06301A Unspecified focal traumatic brain injury with loss of consciousness of 30 minutes or less, initial encounter: Secondary | ICD-10-CM

## 2023-10-01 LAB — CULTURE, BLOOD (ROUTINE X 2)
Culture: NO GROWTH
Culture: NO GROWTH
Special Requests: ADEQUATE
Special Requests: ADEQUATE

## 2023-10-02 DIAGNOSIS — S06301A Unspecified focal traumatic brain injury with loss of consciousness of 30 minutes or less, initial encounter: Secondary | ICD-10-CM

## 2023-10-02 DIAGNOSIS — I5020 Unspecified systolic (congestive) heart failure: Secondary | ICD-10-CM | POA: Diagnosis not present

## 2023-10-02 DIAGNOSIS — L309 Dermatitis, unspecified: Secondary | ICD-10-CM | POA: Diagnosis not present

## 2023-10-02 DIAGNOSIS — Z930 Tracheostomy status: Secondary | ICD-10-CM

## 2023-10-02 DIAGNOSIS — J9621 Acute and chronic respiratory failure with hypoxia: Secondary | ICD-10-CM

## 2023-10-02 DIAGNOSIS — I82499 Acute embolism and thrombosis of other specified deep vein of unspecified lower extremity: Secondary | ICD-10-CM | POA: Diagnosis not present

## 2023-10-02 DIAGNOSIS — J189 Pneumonia, unspecified organism: Secondary | ICD-10-CM

## 2023-10-03 DIAGNOSIS — I5020 Unspecified systolic (congestive) heart failure: Secondary | ICD-10-CM | POA: Diagnosis not present

## 2023-10-03 DIAGNOSIS — J189 Pneumonia, unspecified organism: Secondary | ICD-10-CM

## 2023-10-03 DIAGNOSIS — S06301A Unspecified focal traumatic brain injury with loss of consciousness of 30 minutes or less, initial encounter: Secondary | ICD-10-CM

## 2023-10-03 DIAGNOSIS — J9621 Acute and chronic respiratory failure with hypoxia: Secondary | ICD-10-CM

## 2023-10-03 DIAGNOSIS — L309 Dermatitis, unspecified: Secondary | ICD-10-CM | POA: Diagnosis not present

## 2023-10-03 DIAGNOSIS — I82499 Acute embolism and thrombosis of other specified deep vein of unspecified lower extremity: Secondary | ICD-10-CM | POA: Diagnosis not present

## 2023-10-03 DIAGNOSIS — Z930 Tracheostomy status: Secondary | ICD-10-CM

## 2023-10-04 DIAGNOSIS — I82499 Acute embolism and thrombosis of other specified deep vein of unspecified lower extremity: Secondary | ICD-10-CM | POA: Diagnosis not present

## 2023-10-04 DIAGNOSIS — L309 Dermatitis, unspecified: Secondary | ICD-10-CM | POA: Diagnosis not present

## 2023-10-04 DIAGNOSIS — I5020 Unspecified systolic (congestive) heart failure: Secondary | ICD-10-CM | POA: Diagnosis not present

## 2023-10-04 DIAGNOSIS — S06301A Unspecified focal traumatic brain injury with loss of consciousness of 30 minutes or less, initial encounter: Secondary | ICD-10-CM

## 2023-10-04 DIAGNOSIS — J9621 Acute and chronic respiratory failure with hypoxia: Secondary | ICD-10-CM

## 2023-10-04 DIAGNOSIS — J189 Pneumonia, unspecified organism: Secondary | ICD-10-CM

## 2023-10-04 DIAGNOSIS — Z930 Tracheostomy status: Secondary | ICD-10-CM

## 2023-10-15 DIAGNOSIS — L309 Dermatitis, unspecified: Secondary | ICD-10-CM

## 2023-10-15 DIAGNOSIS — I5020 Unspecified systolic (congestive) heart failure: Secondary | ICD-10-CM

## 2023-10-15 DIAGNOSIS — J189 Pneumonia, unspecified organism: Secondary | ICD-10-CM

## 2023-10-15 DIAGNOSIS — I82499 Acute embolism and thrombosis of other specified deep vein of unspecified lower extremity: Secondary | ICD-10-CM

## 2023-10-19 ENCOUNTER — Other Ambulatory Visit: Payer: Self-pay | Admitting: Psychiatry

## 2023-10-31 NOTE — Progress Notes (Unsigned)
 BH MD/PA/NP OP Progress Note  11/05/2023 1:05 PM Blake [name]  MRN:  [id number]  Chief Complaint:  Chief Complaint  Patient presents with   Follow-up   HPI:  According to the chart review, he was admitted with altered mental status and appears to have multifocal pneumonia with course complicated by ARDS now status post tracheostomy tube placement on 06/04 and gastrostomy tube placement by IR on 06/03.  Course complicated by DVT of the left axillary basilic and brachial veins. Now course c/b by recurrent VAP with Klebsiella pneumonia and zosyn  restarted.  Discharged medication includes Quetiapine  50 mg at night, Depakote  750 mg AM, Fluoxetine  20 mg daily  This is a follow-up appointment for TBI, depression.  This is aftercare visit since the recent admission.   He states that he was not doing right, and was disoriented and felt horrible.  He remembered calling his wife.  He was admitted for pneumonia afterwards.  He states that he is now trying to get used to the current situation as 3 people moved into the house.  His stepdaughter, 2 step grandchildren has moved in.  He states that the bathroom is filled up with others.  However, they are mature, and he is trying to get used to it.  When he is asked about sleep, he states that [name] told him that he has GERD Blake [name] denies telling this to him).  When he was redirected, he reports fair sleep except last night. He has fair appetite.  He thinks he has been feeling depressed, although he cannot tell why. He denies SI, HI, hallucinations.   Blake [name], his wife presents to the visit.  He was admitted to [facility] for a few weeks, followed by 8 days rehab at [facility].  She reports frustration regarding the care.  Fluoxetine  was prescribed as duloxetine  could not be prescribed due to him G-tube.  Although they were reportedly informed of switching back to duloxetine  prior to going to rehab, it was never done.  They also had  issues with oxycodone , but she does not take due to adverse reaction.  She believes he tried fluoxetine  in the past and it never worked.  He had passive SI, stating that he did not want to live.  He is back home several days ago, and he has started to eat a few times per week. His mood appears to be improving, and she has not heard any SI. He continues to yell and is a little more irritable at times.  She states that he is a perfectionist and she is a scapegoat.  She believes he was doing better, being calm, not as impulsive prior to admission with higher dose of Depakote .    Orientation- oriented to self, time (except day of the week Wednesday- he could not tell today is Thursday when he is informed that it was Wed yesterday).   Support: wife Household: wife of 25 years Marital status: married Number of children: Employment: International aid/development worker, retired in 2012.  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  Education:  21 years of education   Visit Diagnosis:    ICD-10-CM   1. Traumatic brain injury with loss of consciousness, subsequent encounter  S06.9X9D     2. MDD (major depressive disorder), recurrent episode, mild (HCC)  F33.0     3. High risk medication use  Z79.899 Valproic  acid level      Past Psychiatric History: Please see initial evaluation for full details. I  have reviewed the history. No updates at this time.     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   Chronic left shoulder pain 04/03/2016   Chronic prostatitis 07/11/2013   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   Coronary artery disease    Dementia (HCC)    Depression    Disorder of male genital organs 07/11/2013   Dyspnea    Encounter  for long-term (current) use of medications 11/21/2014   Erectile dysfunction 07/11/2013   Executive function deficit 03/06/2016   GERD (gastroesophageal reflux disease)    Headache    stress   Hearing loss    Hyperlipidemia    Hypertension    Hypogonadism, 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   Major depression, recurrent, chronic (HCC) 03/06/2016   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   Mixed hyperlipidemia 02/02/2014   Myocardial infarction (HCC) 08/2013   OCD (obsessive compulsive disorder)    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   Other specified disorder of male genital organs(608.89) 07/11/2013   Prostatic hypertrophy    Pseudophakia of both eyes 04/20/2015   Raynaud's disease    Reduced libido 07/11/2013   Repeated falls    weak left ankle   Scoliosis    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   Synovitis    knees, ankles    Past Surgical History:  Procedure Laterality Date   BACK SURGERY  2011   rods and screws   CLOSED REDUCTION NASAL FRACTURE Bilateral 06/11/2018   Procedure: CLOSED REDUCTION NASAL FRACTURE;  Surgeon: Edda Mt, MD;  Location: [facility];  Service: ENT;  Laterality: Bilateral;   COLONOSCOPY  2015   CORONARY ANGIOPLASTY     2015 stent   CORONARY STENT INTERVENTION N/A 06/08/2019   Procedure: CORONARY STENT INTERVENTION;  Surgeon: Florencio Cara BIRCH, MD;  Location: [facility];  Service: Cardiovascular;  Laterality: N/A;   CYSTOSCOPY  1984   EYE SURGERY Bilateral 2005,2006,2009   detached retina, cataract   FOOT ARTHRODESIS Left 05/30/2015   Procedure: ARTHRODESIS FOOT STJ LT FOOT;  Surgeon: Eva Gay, DPM;  Location: [facility];  Service: Podiatry;  Laterality: Left;  WITH POPLITEAL BLOCK   FOOT SURGERY Left    HERNIA REPAIR Right 1969   inguinal   IR GASTROSTOMY TUBE MOD SED  09/22/2023   LEFT HEART CATH AND CORONARY ANGIOGRAPHY Left 06/08/2019    Procedure: LEFT HEART CATH AND CORONARY ANGIOGRAPHY;  Surgeon: Florencio Cara BIRCH, MD;  Location: [facility];  Service: Cardiovascular;  Laterality: Left;   SEPTOPLASTY Bilateral 06/11/2018   Procedure: SEPTOPLASTY;  Surgeon: Edda Mt, MD;  Location: [facility];  Service: ENT;  Laterality: Bilateral;   SHOULDER SURGERY Right 2004   skull surgery     TOE SURGERY Right 2009   TONSILLECTOMY  2008   TRACHEOSTOMY TUBE PLACEMENT N/A 09/23/2023   Procedure: CREATION, TRACHEOSTOMY;  Surgeon: Rumalda Massie RAMAN, MD;  Location: [facility];  Service: ENT;  Laterality: N/A;    Family Psychiatric History: Please see initial evaluation for full details. I have reviewed the history. No updates at this  time.     Family History:  Family History  Problem Relation Age of Onset   Asperger's syndrome Mother     Social History:  Social History   Socioeconomic History   Marital status: Married    Spouse name: cindy   Number of children: 1   Years of education: Not on file   Highest education level: Professional school degree (e.g., MD, DDS, DVM, JD)  Occupational History   Not on file  Tobacco Use   Smoking status: Never   Smokeless tobacco: Never  Vaping Use   Vaping status: Never Used  Substance and Sexual Activity   Alcohol use: No   Drug use: No   Sexual activity: Not Currently    Birth control/protection: None  Other Topics Concern   Not on file  Social History Narrative   Not on file   Social Drivers of Health   Financial Resource Strain: Not on file  Food Insecurity: No Food Insecurity (09/15/2023)   Hunger Vital Sign    Worried About Running Out of Food in the Last Year: Never true    Ran Out of Food in the Last Year: Never true  Transportation Needs: No Transportation Needs (09/15/2023)   PRAPARE - Administrator, Civil Service (Medical): No    Lack of Transportation (Non-Medical): No  Physical Activity: Not on file  Stress: Not on file  Social Connections:  Patient Unable To Answer (09/15/2023)   Social Connection and Isolation Panel    Frequency of Communication with Friends and Family: Patient unable to answer    Frequency of Social Gatherings with Friends and Family: Patient unable to answer    Attends Religious Services: Patient unable to answer    Active Member of Clubs or Organizations: Patient unable to answer    Attends Banker Meetings: Patient unable to answer    Marital Status: Patient unable to answer    Allergies:  Allergies  Allergen Reactions   Promethazine Other (See Comments)    Severe neuroleptic malignant syndrome requiring intubation   Mirtazapine Other (See Comments)    Other reaction(s): Other (See Comments) Irritability/anger, increased appetite, worsening depression Irritability/anger, increased appetite, worsening depression     Metabolic Disorder Labs: No results found for: HGBA1C, MPG No results found for: PROLACTIN Lab Results  Component Value Date   CHOL 112 05/23/2014   TRIG 150 (H) 09/24/2023   HDL 41 05/23/2014   VLDL 16 05/23/2014   LDLCALC 55 05/23/2014   LDLCALC 124 (H) 09/13/2013   Lab Results  Component Value Date   TSH 1.658 09/15/2023   TSH 3.62 09/13/2013    Therapeutic Level Labs: No results found for: LITHIUM Lab Results  Component Value Date   VALPROATE 28 (L) 11/05/2023   VALPROATE 49 (L) 09/14/2023   No results found for: CBMZ  Current Medications: Current Outpatient Medications  Medication Sig Dispense Refill   DULoxetine  (CYMBALTA ) 60 MG capsule Take 60 mg by mouth 2 (two) times daily.     QUEtiapine  (SEROQUEL ) 25 MG tablet Place 1 tablet (25 mg total) into feeding tube at bedtime. (Patient taking differently: Place 50 mg into feeding tube at bedtime.)     acetaminophen  (TYLENOL ) 500 MG tablet Place 2 tablets (1,000 mg total) into feeding tube every 6 (six) hours. 30 tablet 0   amiodarone  (PACERONE ) 400 MG tablet Place 1 tablet (400 mg total) into  feeding tube daily.     atorvastatin  (LIPITOR) 40 MG tablet Place 1 tablet (  40 mg total) into feeding tube daily.     docusate (COLACE) 50 MG/5ML liquid Place 10 mLs (100 mg total) into feeding tube 2 (two) times daily. 100 mL 0   enoxaparin  (LOVENOX ) 40 MG/0.4ML injection Inject 0.4 mLs (40 mg total) into the skin daily.     fentaNYL  (SUBLIMAZE ) 50 MCG/ML injection Inject 1 mL (50 mcg total) into the vein every 3 (three) hours as needed (to maintain RASS & CPOT goal.).     furosemide  (LASIX ) 20 MG tablet Place 1 tablet (20 mg total) into feeding tube daily.     heparin  25000 UT/250ML infusion Inject 1,900 Units/hr into the vein continuous.     latanoprost  (XALATAN ) 0.005 % ophthalmic solution Place 1 drop into the left eye at bedtime. 2.5 mL 12   losartan  (COZAAR ) 25 MG tablet Place 1 tablet (25 mg total) into feeding tube daily.     losartan  (COZAAR ) 50 MG tablet Place 1 tablet (50 mg total) into feeding tube daily.     metoprolol  tartrate (LOPRESSOR ) 25 MG tablet Place 1 tablet (25 mg total) into feeding tube 2 (two) times daily.     metoprolol  tartrate (LOPRESSOR ) 50 MG tablet Place 1 tablet (50 mg total) into feeding tube 2 (two) times daily.     Mouthwashes (MOUTH RINSE) LIQD solution 15 mLs by Mouth Rinse route every 2 (two) hours.     Mouthwashes (MOUTH RINSE) LIQD solution 15 mLs by Mouth Rinse route as needed (oral care).     naphazoline-glycerin  (CLEAR EYES REDNESS) 0.012-0.25 % SOLN Place 1-2 drops into both eyes 4 (four) times daily as needed for eye irritation.     Nutritional Supplements (FEEDING SUPPLEMENT, VITAL 1.5 CAL,) LIQD Place 1,000 mLs into feeding tube continuous.     oxyCODONE  (OXY IR/ROXICODONE ) 5 MG immediate release tablet Place 1 tablet (5 mg total) into feeding tube every 6 (six) hours. (Patient not taking: Reported on 11/05/2023) 30 tablet 0   oxyCODONE  10 MG TABS Place 1 tablet (10 mg total) into feeding tube every 8 (eight) hours. (Patient not taking: Reported on  11/05/2023)     pantoprazole  (PROTONIX ) 40 MG injection Inject 40 mg into the vein at bedtime.     piperacillin -tazobactam (ZOSYN ) 3.375 GM/50ML IVPB Inject 50 mLs (3.375 g total) into the vein every 8 (eight) hours.     polyethylene glycol (MIRALAX  / GLYCOLAX ) 17 g packet Place 17 g into feeding tube 2 (two) times daily. 14 each 0   prednisoLONE  acetate (PRED FORTE ) 1 % ophthalmic suspension Place 1 drop into the right eye daily. 5 mL 0   Protein (FEEDING SUPPLEMENT, PROSOURCE TF20,) liquid Place 60 mLs into feeding tube daily.     spironolactone  (ALDACTONE ) 25 MG tablet Place 0.5 tablets (12.5 mg total) into feeding tube daily.     spironolactone  (ALDACTONE ) 25 MG tablet Place 1 tablet (25 mg total) into feeding tube daily.     valproic  acid (DEPAKENE ) 250 MG/5ML solution Place 5 mLs (250 mg total) into feeding tube 3 (three) times daily.     No current facility-administered medications for this visit.     Musculoskeletal: Strength & Muscle Tone: within normal limits Gait & Station: using a cane Patient leans: N/A  Psychiatric Specialty Exam: Review of Systems  Psychiatric/Behavioral:  Positive for confusion, dysphoric mood and sleep disturbance. Negative for agitation, behavioral problems, decreased concentration, hallucinations, self-injury and suicidal ideas. The patient is not nervous/anxious and is not hyperactive.     Blood pressure 130/86, pulse  85, temperature (!) 97.4 F (36.3 C), temperature source Temporal, height 5' 6 (1.676 m), weight 182 lb 3.2 oz (82.6 kg).Body mass index is 29.41 kg/m.  General Appearance: Well Groomed  Eye Contact:  Good  Speech:  Clear and Coherent  Volume:  Normal  Mood:  Depressed  Affect:  Appropriate, Congruent, and tearful at times  Thought Process:  Coherent, irrelevant at times  Orientation:  Full (Time, Place, and Person)  Thought Content: Logical   Suicidal Thoughts:  No  Homicidal Thoughts:  No  Memory:  Immediate;   Good   Judgement:  Good  Insight:  Fair  Psychomotor Activity:  Normal, Normal tone, no rigidity, no resting/postural tremors, no tardive dyskinesia    Concentration:  Concentration: Fair and Attention Span: Fair  Recall:  [employer] of Knowledge: Good  Language: Good  Akathisia:  No  Handed:  Right  AIMS (if indicated):    Assets:  Social Support  ADL's:  Intact  Cognition: Impaired,  Mild  Sleep:  Fair   Screenings: GAD-7    Flowsheet Row Office Visit from 08/04/2023 in [facility]  Total GAD-7 Score 0   PHQ2-9    Flowsheet Row Office Visit from 08/04/2023 in [facility]  PHQ-2 Total Score 0   Flowsheet Row ED to Hosp-Admission (Discharged) from 09/14/2023 in [facility] ICU[facility]CCU Office Visit from 08/04/2023 in [facility] ED from 08/15/2022 in [facility] Department at [facility]  C-SSRS RISK CATEGORY No Risk No Risk No Risk     Assessment and Plan:  POLK MINOR [name] is a 73 y.o. year old male with a history of TBI, hypertension, bradycardia s/p ICD, SVT, CAD, CHF, ischemic cardiomyopathy, hyperlipidemia, OSA on oxygen, hypogonadism on testosterone , COVID infection in Sept 2023, who presents for follow up appointment for below.   1. Traumatic brain injury with loss of consciousness, subsequent encounter # r/o delirium Functional Status   IADL: Independent in the following: managing:            Requires assistance with the following: cooking, medications ADL  Independent in the following: bathing and hygiene, feeding, continence, grooming and toileting, walking          Requires assistance with the following: Folate, Vitamin B12, TSH Images: Head CT 05/2022 Brain: No evidence of acute infarction, hemorrhage, hydrocephalus, acute extra-axial collection or mass lesion/mass effect. Again seen is encephalomalacia in the  bilateral anterior inferior frontal regions and anterior temporal lobes as well as left cerebellum, unchanged from prior. Neuropsych assessment: reportedly had neuropsych testing, but unable to locate this Etiology:   The exam is notable for occasional irrelevant thought process, and he requires repeated instruction during the visit, although he remains calm  and alert through the visit.  His wife notes improvement in irritability since uptitration of Depakote , although it has slightly worsened since the recent admission.  His current condition appears to be a mix of symptoms, likely influenced by resolving delirium--given his age, recent hospitalization, and recent infection, all of which are significant risk factors.  Will continue current dose of the Depakote  to target emotional regulation associated with TBI.  Noted that he has been started on quetiapine  since admission; maintain on the current dose at this time, although this medication can be tapered off in the future.  Discussed potential metabolic side effect, EPS, QTc prolongation, and risk of increased mortality for people with dementia.  2. MDD (major depressive disorder), recurrent episode, mild (HCC) There has been worsening in depressive symptoms in the context of recent admission, and duloxetine  being switched to fluoxetine  due to G-tube administration.  Both the patient and his wife reports limited benefit from fluoxetine  when he tried it  in the past.  According to the collateral, he voiced SI, although it is unclear whether it was in the context of delirium.  Will switch back to duloxetine  at this time given it has been beneficial to some extent.  May consider switching to another antidepressant if no improvement in his symptoms.  Will continue current dose of quetiapine  as it can be beneficial for depression.   3. High risk medication use History: Dx with depression in 1991, being on duloxetine  60 mg BID since then   We obtain lab to  monitor Depakote .     Last checked  EKG HR 88, QTc478msec Sinus rhythm, ventricular bigeminy 08/2023  Lipid panels LDL 142 H  11/2021- Due  HbA1c Glu 118  09/2023       Plan Continue Depakote  ER 750 mg daily  Discontinue fluoxetine  20 mg daily  Start duloxetine  60 mg daily for two weeks, then 60 mg twice a day  Continue Quetiapine  50 mg at night Obtain labs for VPA Next appointment-  8/26 at 10 AM, IP - on melatonin 10 mg     Collaboration of Care: Collaboration of Care: Other reviewed notes in Epic  Patient/Guardian was advised Release of Information must be obtained prior to any record release in order to collaborate their care with an outside provider. Patient/Guardian was advised if they have not already done so to contact the registration department to sign all necessary forms in order for us  to release information regarding their care.   A total of 50 minutes was spent on the following activities during the encounter date, which includes but is not limited to: preparing to see the patient (e.g., reviewing tests and records), obtaining and/or reviewing separately obtained history, performing a medically necessary examination or evaluation, counseling and educating the patient, family, or caregiver, ordering medications, tests, or procedures, referring and communicating with other healthcare professionals (when not reported separately), documenting clinical information in the electronic or paper health record, independently interpreting test or lab results and communicating these results to the family or caregiver, and coordinating care (when not reported separately).   Consent: Patient/Guardian gives verbal consent for treatment and assignment of benefits for services provided during this visit. Patient/Guardian expressed understanding and agreed to proceed.    Katheren Sleet, MD 11/05/2023, 1:05 PM

## 2023-11-05 ENCOUNTER — Ambulatory Visit: Payer: Self-pay | Admitting: Psychiatry

## 2023-11-05 ENCOUNTER — Encounter: Payer: Self-pay | Admitting: Psychiatry

## 2023-11-05 ENCOUNTER — Other Ambulatory Visit: Payer: Self-pay

## 2023-11-05 ENCOUNTER — Other Ambulatory Visit
Admission: RE | Admit: 2023-11-05 | Discharge: 2023-11-05 | Disposition: A | Discharge status: Home / Self Care | Attending: Psychiatry | Admitting: Psychiatry

## 2023-11-05 ENCOUNTER — Ambulatory Visit (INDEPENDENT_AMBULATORY_CARE_PROVIDER_SITE_OTHER): Admitting: Psychiatry

## 2023-11-05 VITALS — BP 130/86 | HR 85 | Temp 97.4°F | Ht 66.0 in | Wt 182.2 lb

## 2023-11-05 DIAGNOSIS — S069X9D Unspecified intracranial injury with loss of consciousness of unspecified duration, subsequent encounter: Secondary | ICD-10-CM

## 2023-11-05 DIAGNOSIS — Z79899 Other long term (current) drug therapy: Secondary | ICD-10-CM | POA: Diagnosis not present

## 2023-11-05 DIAGNOSIS — F330 Major depressive disorder, recurrent, mild: Secondary | ICD-10-CM | POA: Diagnosis not present

## 2023-11-05 LAB — VALPROIC ACID LEVEL: Valproic Acid Lvl: 28 ug/mL — ABNORMAL LOW (ref 50–100)

## 2023-11-05 NOTE — Progress Notes (Signed)
 Spoke to patients wife inform of the message about patients Depakote  levels and advised to continue with the current dosage for now she voiced understanding

## 2023-11-05 NOTE — Patient Instructions (Signed)
 Continue Depakote  ER 750 mg daily  Discontinue fluoxetine  20 mg daily  Start duloxetine  60 mg daily for two weeks, then 60 mg twice a day  Continue Quetiapine  50 mg at night Obtain labs for VPA Next appointment-  8/26 at 10 AM

## 2023-11-05 NOTE — Progress Notes (Signed)
 Please inform the patient/his wife that their Depakote  level is currently below the therapeutic range. However, I would advise to continue with the current dosage for now.

## 2023-11-19 ENCOUNTER — Telehealth: Payer: Self-pay | Admitting: Urology

## 2023-11-19 NOTE — Telephone Encounter (Signed)
 Patient called and stated that he stopped taking his Testosterone  injections after 09/12/23. He was admitted to the hospital on 09/14/23, and then went to rehab. Patient is back home now. He is wanting to know if it was ok for him to stop, or if he should have tapered down? He is asking about contraindications. He does not want to resume taking it. Does he need to have any labs done? Please call patient to discuss and advise.

## 2023-11-20 NOTE — Telephone Encounter (Signed)
 The testosterone  did not need to be tapered.  Does not necessarily need any labs unless he is interested where his testosterone  level is.

## 2023-11-24 ENCOUNTER — Other Ambulatory Visit: Payer: Self-pay | Admitting: Psychiatry

## 2023-11-24 ENCOUNTER — Telehealth: Payer: Self-pay

## 2023-11-24 MED ORDER — QUETIAPINE FUMARATE 50 MG PO TABS
50.0000 mg | ORAL_TABLET | Freq: Every day | ORAL | 3 refills | Status: DC
Start: 2023-11-24 — End: 2023-12-17

## 2023-11-24 MED ORDER — DULOXETINE HCL 60 MG PO CPEP: 60.0000 mg | ORAL_CAPSULE | Freq: Two times a day (BID) | ORAL | 3 refills | Status: DC

## 2023-11-24 MED ORDER — DIVALPROEX SODIUM ER 500 MG PO TB24: 500.0000 mg | ORAL_TABLET | Freq: Every day | ORAL | 0 refills | Status: DC

## 2023-11-24 MED ORDER — DIVALPROEX SODIUM ER 250 MG PO TB24: 250.0000 mg | ORAL_TABLET | Freq: Every day | ORAL | 0 refills | Status: DC

## 2023-11-24 NOTE — Telephone Encounter (Signed)
 Pt Blake [name] on triage line- Returning a call from Lyn regarding his Testosterone .

## 2023-11-24 NOTE — Telephone Encounter (Signed)
 pt wife left a message that romaine needed refills on the seroquel , the duloxetine  and the depakote . she stated that these medications were given to him when he was in the hospital and he is doing very well. please send to [facility] [address] pt was last on 7-17 next appt 8-26

## 2023-11-24 NOTE — Telephone Encounter (Signed)
 Ordered

## 2023-11-24 NOTE — Telephone Encounter (Signed)
 Pt wife notified.

## 2023-11-29 ENCOUNTER — Other Ambulatory Visit: Payer: Self-pay

## 2023-11-29 ENCOUNTER — Emergency Department

## 2023-11-29 ENCOUNTER — Emergency Department: Admission: EM | Admit: 2023-11-29 | Discharge: 2023-11-29 | Disposition: A | Discharge status: Home / Self Care

## 2023-11-29 DIAGNOSIS — S025XXA Fracture of tooth (traumatic), initial encounter for closed fracture: Secondary | ICD-10-CM | POA: Diagnosis not present

## 2023-11-29 DIAGNOSIS — I5020 Unspecified systolic (congestive) heart failure: Secondary | ICD-10-CM | POA: Diagnosis not present

## 2023-11-29 DIAGNOSIS — Z86718 Personal history of other venous thrombosis and embolism: Secondary | ICD-10-CM | POA: Diagnosis not present

## 2023-11-29 DIAGNOSIS — S0992XA Unspecified injury of nose, initial encounter: Secondary | ICD-10-CM | POA: Diagnosis present

## 2023-11-29 DIAGNOSIS — S80211A Abrasion, right knee, initial encounter: Secondary | ICD-10-CM | POA: Insufficient documentation

## 2023-11-29 DIAGNOSIS — W19XXXA Unspecified fall, initial encounter: Secondary | ICD-10-CM

## 2023-11-29 DIAGNOSIS — Z7901 Long term (current) use of anticoagulants: Secondary | ICD-10-CM | POA: Diagnosis not present

## 2023-11-29 DIAGNOSIS — S022XXA Fracture of nasal bones, initial encounter for closed fracture: Secondary | ICD-10-CM | POA: Insufficient documentation

## 2023-11-29 DIAGNOSIS — S60512A Abrasion of left hand, initial encounter: Secondary | ICD-10-CM | POA: Insufficient documentation

## 2023-11-29 DIAGNOSIS — S0990XA Unspecified injury of head, initial encounter: Secondary | ICD-10-CM | POA: Insufficient documentation

## 2023-11-29 DIAGNOSIS — S0081XA Abrasion of other part of head, initial encounter: Secondary | ICD-10-CM

## 2023-11-29 DIAGNOSIS — M25562 Pain in left knee: Secondary | ICD-10-CM | POA: Diagnosis not present

## 2023-11-29 DIAGNOSIS — Y92093 Driveway of other non-institutional residence as the place of occurrence of the external cause: Secondary | ICD-10-CM | POA: Diagnosis not present

## 2023-11-29 DIAGNOSIS — S01511A Laceration without foreign body of lip, initial encounter: Secondary | ICD-10-CM | POA: Insufficient documentation

## 2023-11-29 DIAGNOSIS — I2510 Atherosclerotic heart disease of native coronary artery without angina pectoris: Secondary | ICD-10-CM | POA: Diagnosis not present

## 2023-11-29 DIAGNOSIS — W01198A Fall on same level from slipping, tripping and stumbling with subsequent striking against other object, initial encounter: Secondary | ICD-10-CM | POA: Insufficient documentation

## 2023-11-29 DIAGNOSIS — Z95810 Presence of automatic (implantable) cardiac defibrillator: Secondary | ICD-10-CM | POA: Diagnosis not present

## 2023-11-29 HISTORY — DX: Unspecified convulsions: R56.9

## 2023-11-29 HISTORY — DX: Unspecified intracranial injury with loss of consciousness status unknown, initial encounter: S06.9XAA

## 2023-11-29 HISTORY — DX: Heart failure, unspecified: I50.9

## 2023-11-29 LAB — URINALYSIS, COMPLETE (UACMP) WITH MICROSCOPIC
Bacteria, UA: NONE SEEN
Bilirubin Urine: NEGATIVE
Glucose, UA: >=500 mg/dL — AB
Hgb urine dipstick: NEGATIVE
Ketones, ur: 5 mg/dL — AB
Leukocytes,Ua: NEGATIVE
Nitrite: NEGATIVE
Protein, ur: NEGATIVE mg/dL
Specific Gravity, Urine: 1.018 (ref 1.005–1.030)
Squamous Epithelial / HPF: 0 /HPF (ref 0–5)
pH: 5 (ref 5.0–8.0)

## 2023-11-29 LAB — CBC WITH DIFFERENTIAL/PLATELET
Abs Immature Granulocytes: 0.04 K/uL (ref 0.00–0.07)
Basophils Absolute: 0.1 K/uL (ref 0.0–0.1)
Basophils Relative: 1 %
Eosinophils Absolute: 0.2 K/uL (ref 0.0–0.5)
Eosinophils Relative: 3 %
HCT: 39.6 % (ref 39.0–52.0)
Hemoglobin: 13.3 g/dL (ref 13.0–17.0)
Immature Granulocytes: 1 %
Lymphocytes Relative: 22 %
Lymphs Abs: 1.3 K/uL (ref 0.7–4.0)
MCH: 32.2 pg (ref 26.0–34.0)
MCHC: 33.6 g/dL (ref 30.0–36.0)
MCV: 95.9 fL (ref 80.0–100.0)
Monocytes Absolute: 0.8 K/uL (ref 0.1–1.0)
Monocytes Relative: 13 %
Neutro Abs: 3.6 K/uL (ref 1.7–7.7)
Neutrophils Relative %: 60 %
Platelets: 228 K/uL (ref 150–400)
RBC: 4.13 MIL/uL — ABNORMAL LOW (ref 4.22–5.81)
RDW: 14.4 % (ref 11.5–15.5)
WBC: 6 K/uL (ref 4.0–10.5)
nRBC: 0 % (ref 0.0–0.2)

## 2023-11-29 LAB — BASIC METABOLIC PANEL WITH GFR
Anion gap: 11 (ref 5–15)
BUN: 22 mg/dL (ref 8–23)
CO2: 28 mmol/L (ref 22–32)
Calcium: 9.5 mg/dL (ref 8.9–10.3)
Chloride: 99 mmol/L (ref 98–111)
Creatinine, Ser: 1.06 mg/dL (ref 0.61–1.24)
GFR, Estimated: >60 mL/min (ref >60)
Glucose, Bld: 101 mg/dL — ABNORMAL HIGH (ref 70–99)
Potassium: 4.1 mmol/L (ref 3.5–5.1)
Sodium: 138 mmol/L (ref 135–145)

## 2023-11-29 MED ORDER — LIDOCAINE-EPINEPHRINE-TETRACAINE (LET) TOPICAL GEL
3.0000 mL | Freq: Once | TOPICAL | Status: AC
  Administered 2023-11-29: 3 mL via TOPICAL
  Filled 2023-11-29 (×2): qty 3

## 2023-11-29 MED ORDER — ACETAMINOPHEN 500 MG PO TABS: 1000.0000 mg | ORAL_TABLET | Freq: Four times a day (QID) | ORAL | 2 refills | Status: AC | PRN

## 2023-11-29 MED ORDER — BACITRACIN ZINC 500 UNIT/GM EX OINT
TOPICAL_OINTMENT | Freq: Once | CUTANEOUS | Status: AC
  Administered 2023-11-29: 1 via TOPICAL
  Filled 2023-11-29: qty 0.9

## 2023-11-29 MED ORDER — ACETAMINOPHEN 500 MG PO TABS
1000.0000 mg | ORAL_TABLET | Freq: Once | ORAL | Status: AC
  Administered 2023-11-29: 1000 mg via ORAL
  Filled 2023-11-29: qty 2

## 2023-11-29 MED ORDER — TETANUS-DIPHTH-ACELL PERTUSSIS 5-2.5-18.5 LF-MCG/0.5 IM SUSY
0.5000 mL | PREFILLED_SYRINGE | Freq: Once | INTRAMUSCULAR | Status: DC
  Filled 2023-11-29: qty 0.5

## 2023-11-29 MED ORDER — LIDOCAINE-EPINEPHRINE (PF) 2 %-1:200000 IJ SOLN
10.0000 mL | Freq: Once | INTRAMUSCULAR | Status: AC
  Administered 2023-11-29: 10 mL via INTRADERMAL
  Filled 2023-11-29: qty 20

## 2023-11-29 NOTE — ED Notes (Signed)
 Blue top sent

## 2023-11-29 NOTE — Discharge Instructions (Signed)
 Evaluation in the emergency department was notable for a fracture of your nose as well as 3 dental fractures.  Please do follow-up with your primary care provider and dentist soon as possible for reevaluation of these, I do suspect she will require interventions by your dentist.  You can continue use Tylenol  as needed for any ongoing discomfort.  I did place skin glue on the wound above your lip, and this will come off on its own within the next several days.  Return to the emergency department with any new or worsening symptoms.

## 2023-11-29 NOTE — ED Triage Notes (Signed)
 Pt to ED with wife, pt tripped in driveway and fell onto concrete on face. 3 front upper teeth are chipped. Also pt has skin avulsion to L anterior hand. No visible deformity to hand or wrist. Pt takes Eliquis and Plavix  and has DVT to L upper arm. Pt is HOH.wife is good historian. Unsure if LOC, pt denies LOC.

## 2023-11-29 NOTE — ED Provider Notes (Signed)
 [facility] Provider Note    Event Date/Time   First MD Initiated Contact with Patient 11/29/23 1521     (approximate)   History   Fall and Facial Injury  Pt to ED with wife, pt tripped in driveway and fell onto concrete on face. 3 front upper teeth are chipped. Also pt has skin avulsion to L anterior hand. No visible deformity to hand or wrist. Pt takes Eliquis and Plavix  and has DVT to L upper arm. Pt is HOH.wife is good historian. Unsure if LOC, pt denies LOC.    HPI Blake [name] is a 73 y.o. male PMH CAD with DES, ischemic cardiomyopathy, ICD/pacemaker present, TBI with cognitive deficits, hyperlipidemia, prior stroke, HFrEF, seizures, left upper extremity DVT on Eliquis, Asperger syndrome presents for evaluation after fall - Patient was moving trash cans, tells me he tripped on a root on the ground, fell forward, broke his hand with his left fall in his face.  Chipped 3 teeth.  No loss of consciousness.  Did take his Eliquis today. - Collateral gathered from his wife at bedside.  Note he does have somewhat frequent falls.  Otherwise been in his usual state of health recently. - No significant pain currently, does have mild pain to bilateral knees as well as left hand in addition to his periorbital region.  Per chart review, last seen in cardiology clinic on 11/26/2023.  Discontinued metoprolol  and aspirin , increased Coreg , increased Lasix , increase spironolactone .     Physical Exam   Triage Vital Signs: ED Triage Vitals  Encounter Vitals Group     BP 11/29/23 1421 113/67     Girls Systolic BP Percentile --      Girls Diastolic BP Percentile --      Boys Systolic BP Percentile --      Boys Diastolic BP Percentile --      Pulse Rate 11/29/23 1421 74     Resp 11/29/23 1421 20     Temp 11/29/23 1429 98 F (36.7 C)     Temp Source 11/29/23 1421 Axillary     SpO2 11/29/23 1421 100 %     Weight 11/29/23 1427 188 lb (85.3 kg)     Height 11/29/23  1427 5' 6 (1.676 m)     Head Circumference --      Peak Flow --      Pain Score 11/29/23 1422 5     Pain Loc --      Pain Education --      Exclude from Growth Chart --     Most recent vital signs: Vitals:   11/29/23 1800 11/29/23 1849  BP: (!) 152/90   Pulse: 81   Resp: 17   Temp:  98.1 F (36.7 C)  SpO2: 96%      General: Awake, no distress.  HEENT: Abrasion/skin avulsion immediately above lip with some mild oozing, no internal lip/gum lacerations (not through and through), 3 front teeth chipped (8, 9, 10), teeth not bleeding and are not loose.  No other external evidence of head trauma.  No midline neck pain. CV:  Good peripheral perfusion. RRR, RP 2+ Resp:  Normal effort. CTAB Back:  No midline back pain Abd:  No distention. Nontender to deep palpation throughout BLE:   Mild ttp b/l knees, mild overlying abrasion on right knee.  No gross deformity or effusions bilateral knees.  Full range of motion of bilateral knees.  No tenderness to palpation of the pelvis, full range  of motion of hips, no limb length discrepancy or abnormal rotation. BUE:   Abrasion to palm of L hand over distal 4th metacarpal with small 0.5cm laceration, some tenderness in this area.  Hemostatic, no foreign bodies appreciated.  No tenderness to palpation elsewhere throughout bilateral upper extremities, full range of motion of all joints, no deformities.   ED Results / Procedures / Treatments   Labs (all labs ordered are listed, but only abnormal results are displayed) Labs Reviewed  BASIC METABOLIC PANEL WITH GFR - Abnormal; Notable for the following components:      Result Value   Glucose, Bld 101 (*)    All other components within normal limits  CBC WITH DIFFERENTIAL/PLATELET - Abnormal; Notable for the following components:   RBC 4.13 (*)    All other components within normal limits  URINALYSIS, COMPLETE (UACMP) WITH MICROSCOPIC - Abnormal; Notable for the following components:   Color, Urine  YELLOW (*)    APPearance HAZY (*)    Glucose, UA >=500 (*)    Ketones, ur 5 (*)    All other components within normal limits     EKG  N/a   RADIOLOGY Radiology interpreted by myself and radiology report reviewed.  Right nasal fracture noted.  No other acute pathology identified.    PROCEDURES:  Critical Care performed: No  .Laceration Repair  Date/Time: 11/29/2023 8:16 PM  Performed by: Clarine Ozell LABOR, MD Authorized by: Clarine Ozell LABOR, MD   Consent:    Consent obtained:  Verbal   Consent given by:  Patient   Risks, benefits, and alternatives were discussed: yes     Risks discussed:  Infection, pain, poor cosmetic result, poor wound healing and need for additional repair   Alternatives discussed:  No treatment and delayed treatment Universal protocol:    Imaging studies available: yes     Patient identity confirmed:  Verbally with patient and arm band Anesthesia:    Anesthesia method:  Topical application   Topical anesthetic:  LET Laceration details:    Location:  Face   Facial location: just above lip.   Length (cm):  0.5 Exploration:    Limited defect created (wound extended): no     Hemostasis achieved with:  LET   Wound extent: no foreign body, no signs of injury, no underlying fracture and no vascular damage   Treatment:    Area cleansed with:  Saline   Amount of cleaning:  Extensive   Irrigation solution:  Sterile saline   Irrigation method:  Pressure wash   Visualized foreign bodies/material removed: no     Debridement:  None   Undermining:  None   Scar revision: no   Skin repair:    Repair method:  Tissue adhesive Approximation:    Approximation:  Loose    MEDICATIONS ORDERED IN ED: Medications  Tdap (BOOSTRIX) injection 0.5 mL (0.5 mLs Intramuscular Not Given 11/29/23 1741)  bacitracin  ointment (has no administration in time range)  acetaminophen  (TYLENOL ) tablet 1,000 mg (1,000 mg Oral Given 11/29/23 1742)  lidocaine -EPINEPHrine  (XYLOCAINE   W/EPI) 2 %-1:200000 (PF) injection 10 mL (10 mLs Intradermal Given by Other 11/29/23 1837)  lidocaine -EPINEPHrine -tetracaine  (LET) topical gel (3 mLs Topical Given 11/29/23 1936)     IMPRESSION / MDM / ASSESSMENT AND PLAN / ED COURSE  I reviewed the triage vital signs and the nursing notes.  DDX/MDM/AP: Differential diagnosis includes, but is not limited to, obvious dental fractures, consider facial fractures, skull fracture, intracranial hemorrhage.  Doubt C-spine fracture though will screen given age.  Consider possibility of left hand fracture, doubt bilateral knee fractures but given pain and abrasions will screen with x-ray.  Maintain n.p.o. in the meantime.  Appears to have mechanical fall, will get basic screening labs and attempt to collect urine if able although no obvious urinary symptoms.  Plan: - Labs - CT head, face, C-spine -  x-ray bilateral knees, left hand - Tdap, Tylenol  - N.p.o. - Reassess  Patient's presentation is most consistent with acute presentation with potential threat to life or bodily function.   ED course below.  Workup notable for right nasal fracture of undetermined age, suspect likely acute.  Did have small oozing at area of abrasion above the lip, appears to have a very small laceration of less than 0.5 cm, hemostasis achieved with LET, tissue adhesive applied above.  Regarding dental fractures, plan for dental follow-up.  Rx Tylenol , patient family preferred to avoid narcotics given frequent falls, I believe this is very reasonable.  Also plan for PMD follow-up regarding nasal fracture.  ED return precautions in place.  Patient and family agree with plan.  Clinical Course as of 11/29/23 2019  Sun Nov 29, 2023  1555 CBC reviewed, unremarkable [MM]  1645 X-rays reviewed, no acute pathology identified, radiology reports reviewed [MM]  1724 CTH, CTMF, CTCSpine:IMPRESSION: 1.  No acute intracranial abnormality. 2.  Age-indeterminate, possibly acute, nondisplaced right nasal bone fracture. 3. No acute displaced fracture or traumatic listhesis of the cervical spine.   [MM]  1909 Not tolerating attempts at anesthesia with needle well, not even been able to but 1/2 cc into the area of oozing blood at the skin avulsion just above his middle upper lip.  Will trial LET instead.  Otherwise stable for discharge, do want a get hemostasis prior.  Nonpulsatile bleeding, do not suspect underlying arterial damage. [MM]    Clinical Course User Index [MM] Clarine Ozell LABOR, MD     FINAL CLINICAL IMPRESSION(S) / ED DIAGNOSES   Final diagnoses:  Fall, initial encounter  Closed nondisplaced fracture of nasal bone, initial encounter  Abrasion of face, initial encounter  Closed fracture of tooth, initial encounter     Rx / DC Orders   ED Discharge Orders          Ordered    acetaminophen  (TYLENOL ) 500 MG tablet  Every 6 hours PRN        11/29/23 2014             Note:  This document was prepared using Dragon voice recognition software and may include unintentional dictation errors.   Clarine Ozell LABOR, MD 11/29/23 2019

## 2023-12-11 ENCOUNTER — Ambulatory Visit: Payer: Self-pay | Admitting: Urology

## 2023-12-11 ENCOUNTER — Ambulatory Visit (INDEPENDENT_AMBULATORY_CARE_PROVIDER_SITE_OTHER): Admitting: Urology

## 2023-12-11 VITALS — BP 98/64 | Ht 67.0 in | Wt 188.0 lb

## 2023-12-11 DIAGNOSIS — N401 Enlarged prostate with lower urinary tract symptoms: Secondary | ICD-10-CM

## 2023-12-11 DIAGNOSIS — N138 Other obstructive and reflux uropathy: Secondary | ICD-10-CM

## 2023-12-12 ENCOUNTER — Encounter: Payer: Self-pay | Admitting: Urology

## 2023-12-12 NOTE — Progress Notes (Signed)
 12/11/2023 12:15 PM   Blake [name] 1950/06/02 [id number]  Referring provider: Glover Lenis, MD 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 male who presents for follow-up visit.  His wife was with him today  Since last year's visit he was hospitalized from May 26-September 29, 2023 with altered mental status and multifocal pneumonia complicated by ARDS.  Required intubation and subsequent tracheostomy.  Was diagnosed with DVT of the left axillary basilic and brachial veins Was taken off TRT during his hospitalization and does not desire to restart Stable lower urinary tract symptoms.  He takes tadalafil  and silodosin  daily His wife states he has had frequent falls since his hospital discharge and has had labile blood pressure.   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   Chronic left shoulder pain 04/03/2016   Chronic prostatitis 07/11/2013   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   Coronary artery disease    Dementia (HCC)    Depression    Disorder of male genital organs 07/11/2013   Dyspnea    Encounter for long-term (current) use of medications 11/21/2014   Erectile dysfunction 07/11/2013   Executive function deficit 03/06/2016   GERD (gastroesophageal reflux disease)    Headache    stress   Hearing loss    Hyperlipidemia    Hypertension    Hypogonadism, 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   Major depression, recurrent, chronic (HCC) 03/06/2016   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 last one was 07/2022. from a fall.    Surgical History: Past Surgical History:  Procedure Laterality Date   BACK SURGERY  2011   rods and screws   CLOSED REDUCTION NASAL FRACTURE Bilateral 06/11/2018   Procedure: CLOSED REDUCTION NASAL FRACTURE;  Surgeon: Edda Mt, MD;  Location: [facility];  Service: ENT;  Laterality: Bilateral;   COLONOSCOPY  2015   CORONARY ANGIOPLASTY     2015 stent   CORONARY STENT INTERVENTION N/A 06/08/2019   Procedure: CORONARY STENT INTERVENTION;  Surgeon: Florencio Cara BIRCH, MD;  Location: [facility];  Service: Cardiovascular;  Laterality: N/A;   CYSTOSCOPY  1984   EYE SURGERY Bilateral [id number]  detached retina, cataract   FOOT ARTHRODESIS Left 05/30/2015   Procedure: ARTHRODESIS FOOT STJ LT FOOT;  Surgeon: Eva Gay, DPM;  Location: [facility];  Service: Podiatry;  Laterality: Left;  WITH POPLITEAL BLOCK   FOOT SURGERY Left    HERNIA REPAIR Right 1969   inguinal   IR GASTROSTOMY TUBE MOD SED  09/22/2023   LEFT HEART CATH AND CORONARY ANGIOGRAPHY Left 06/08/2019   Procedure: LEFT HEART CATH AND CORONARY ANGIOGRAPHY;  Surgeon: Florencio Cara BIRCH, MD;  Location: [facility];   Service: Cardiovascular;  Laterality: Left;   SEPTOPLASTY Bilateral 06/11/2018   Procedure: SEPTOPLASTY;  Surgeon: Edda Mt, MD;  Location: [facility];  Service: ENT;  Laterality: Bilateral;   SHOULDER SURGERY Right 2004   skull surgery     TOE SURGERY Right 2009   TONSILLECTOMY  2008   TRACHEOSTOMY TUBE PLACEMENT N/A 09/23/2023   Procedure: CREATION, TRACHEOSTOMY;  Surgeon: Rumalda Massie RAMAN, MD;  Location: [facility];  Service: ENT;  Laterality: N/A;    Home Medications:  Allergies as of 12/11/2023       Reactions   Promethazine Other (See Comments)   Severe neuroleptic malignant syndrome requiring intubation   Mirtazapine Other (See Comments)   Other reaction(s): Other (See Comments) Irritability/anger, increased appetite, worsening depression Irritability/anger, increased appetite, worsening depression        Medication List        Accurate as of December 11, 2023 11:59 PM. If you have any questions, ask your nurse or doctor.          acetaminophen  500 MG tablet Commonly known as: TYLENOL  Place 2 tablets (1,000 mg total) into feeding tube every 6 (six) hours.   acetaminophen  500 MG tablet Commonly known as: TYLENOL  Take 2 tablets (1,000 mg total) by mouth every 6 (six) hours as needed.   amiodarone  400 MG tablet Commonly known as: PACERONE  Place 1 tablet (400 mg total) into feeding tube daily.   atorvastatin  40 MG tablet Commonly known as: LIPITOR Place 1 tablet (40 mg total) into feeding tube daily.   divalproex  250 MG 24 hr tablet Commonly known as: Depakote  ER Take 1 tablet (250 mg total) by mouth daily. Total of 750 mg daily. Take along with 500 mg tab   divalproex  500 MG 24 hr tablet Commonly known as: Depakote  ER Take 1 tablet (500 mg total) by mouth daily. Total of 750 mg daily. Take along with 250 mg tab   docusate 50 MG/5ML liquid Commonly known as: COLACE Place 10 mLs (100 mg total) into feeding tube 2 (two) times daily.   DULoxetine  60 MG  capsule Commonly known as: CYMBALTA  Take 1 capsule (60 mg total) by mouth 2 (two) times daily.   enoxaparin  40 MG/0.4ML injection Commonly known as: LOVENOX  Inject 0.4 mLs (40 mg total) into the skin daily.   feeding supplement (PROSource TF20) liquid Place 60 mLs into feeding tube daily.   feeding supplement (VITAL 1.5 CAL) Liqd Place 1,000 mLs into feeding tube continuous.   fentaNYL  50 MCG/ML injection Commonly known as: SUBLIMAZE  Inject 1 mL (50 mcg total) into the vein every 3 (three) hours as needed (to maintain RASS & CPOT goal.).   furosemide  20 MG tablet Commonly known as: LASIX  Place 1 tablet (20 mg total) into feeding tube daily.   heparin  25000 UT/250ML infusion Inject 1,900 Units/hr into the vein continuous.   latanoprost  0.005 % ophthalmic solution Commonly known as: XALATAN  Place 1 drop into the left eye at  bedtime.   losartan  25 MG tablet Commonly known as: COZAAR  Place 1 tablet (25 mg total) into feeding tube daily.   losartan  50 MG tablet Commonly known as: COZAAR  Place 1 tablet (50 mg total) into feeding tube daily.   metoprolol  tartrate 25 MG tablet Commonly known as: LOPRESSOR  Place 1 tablet (25 mg total) into feeding tube 2 (two) times daily.   metoprolol  tartrate 50 MG tablet Commonly known as: LOPRESSOR  Place 1 tablet (50 mg total) into feeding tube 2 (two) times daily.   mouth rinse Liqd solution 15 mLs by Mouth Rinse route every 2 (two) hours.   mouth rinse Liqd solution 15 mLs by Mouth Rinse route as needed (oral care).   naphazoline-glycerin  0.012-0.25 % Soln Commonly known as: CLEAR EYES REDNESS Place 1-2 drops into both eyes 4 (four) times daily as needed for eye irritation.   oxyCODONE  5 MG immediate release tablet Commonly known as: Oxy IR/ROXICODONE  Place 1 tablet (5 mg total) into feeding tube every 6 (six) hours.   Oxycodone  HCl 10 MG Tabs Place 1 tablet (10 mg total) into feeding tube every 8 (eight) hours.    pantoprazole  40 MG injection Commonly known as: PROTONIX  Inject 40 mg into the vein at bedtime.   piperacillin -tazobactam 3.375 GM/50ML IVPB Commonly known as: ZOSYN  Inject 50 mLs (3.375 g total) into the vein every 8 (eight) hours.   polyethylene glycol 17 g packet Commonly known as: MIRALAX  / GLYCOLAX  Place 17 g into feeding tube 2 (two) times daily.   prednisoLONE  acetate 1 % ophthalmic suspension Commonly known as: PRED FORTE  Place 1 drop into the right eye daily.   QUEtiapine  25 MG tablet Commonly known as: SEROQUEL  Place 1 tablet (25 mg total) into feeding tube at bedtime. What changed: how much to take   QUEtiapine  50 MG tablet Commonly known as: SEROQUEL  Take 1 tablet (50 mg total) by mouth at bedtime. What changed: Another medication with the same name was changed. Make sure you understand how and when to take each.   spironolactone  25 MG tablet Commonly known as: ALDACTONE  Place 0.5 tablets (12.5 mg total) into feeding tube daily.   spironolactone  25 MG tablet Commonly known as: ALDACTONE  Place 1 tablet (25 mg total) into feeding tube daily.   valproic  acid 250 MG/5ML solution Commonly known as: DEPAKENE  Place 5 mLs (250 mg total) into feeding tube 3 (three) times daily.        Allergies:  Allergies  Allergen Reactions   Promethazine Other (See Comments)    Severe neuroleptic malignant syndrome requiring intubation   Mirtazapine Other (See Comments)    Other reaction(s): Other (See Comments) Irritability/anger, increased appetite, worsening depression Irritability/anger, increased appetite, worsening depression     Family History: Family History  Problem Relation Age of Onset   Asperger's syndrome Mother     Social History:  reports that he has never smoked. He has never used smokeless tobacco. He reports that he does not drink alcohol and does not use drugs.   Physical Exam: BP 98/64 (BP Location: Left Arm, Patient Position: Sitting, Cuff  Size: Normal)   Ht 5' 7 (1.702 m)   Wt 188 lb (85.3 kg)   BMI 29.44 kg/m   Constitutional:  Alert, No acute distress. HEENT: [place] AT Respiratory: Normal respiratory effort, no increased work of breathing.   Assessment & Plan:    1. BPH with obstruction/lower urinary tract symptoms  Stable lower urinary tract symptoms on tadalafil /silodosin  PVR today 99 mL Recommend annual follow-up  and to return as needed for any significant change in his voiding pattern   Glendia JAYSON Barba, MD  [facility] [address] [place], [place] [address] [phone]

## 2023-12-12 NOTE — Progress Notes (Unsigned)
 [facility] MD/PA/NP OP Progress Note  12/15/2023 10:52 AM Blake [name]  MRN:  [id number]  Chief Complaint:  Chief Complaint  Patient presents with   Follow-up   HPI:  - since the last visit, he presented to ED after tripped in drive way and fell onto concrete on face . Workup notable for right nasal fracture of undetermined age, suspect likely acute.   He states that he has been doing well.  He tends to feel depressed in the afternoon, and he has decided to split the dose.  He takes it 2 to 3 AM so that he does not feel depressed when he wakes up in the morning.  He tends to find nothing enjoyable, nothing to feel better when he is experiencing depression.  He has been doing well on his current duloxetine  regimen.  He wakes up with nocturia.  He takes a nap during the day.  He enjoyed celebrating his mother in law, 77 at the steakhouse.  He is very close with her, and other family. Of note, a few details were clarified by the patient's wife, including his claim of being on duloxetine  for 33 years (which is inaccurate), and her statement that she has three sisters (she actually has two).   [name], his wife presents to the visit.  She states that her husband is back.  He is pleasure to be with.  This was also noted by her other family members.  She brings him to [employer] and he reads papers.  They are thinking of going on the trip.  He is not impulsive anymore.  Although he used to jump on when she advised him to eat slow bites, he is very receptive to her advise.  They have been taking a walk at the park.  They go to the church.  They are also concerned to go to the [facility].  He used to enjoy connection with group of people.  She thinks the current medication regimen works very well, and denies any concern at this time. Of note, spironolactone  was reduced due to concern of low BP. He seems to be in tune with his body since then, and denies any dizziness, fall.  Wt Readings from Last 3 Encounters:   12/15/23 183 lb 6.4 oz (83.2 kg)  12/11/23 188 lb (85.3 kg)  11/29/23 188 lb (85.3 kg)   Support: wife Household: wife of 25 years Marital status: married Number of children: Employment: International aid/development worker, retired in 2012.  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  Education:  21 years of education  Visit Diagnosis:    ICD-10-CM   1. Traumatic brain injury with loss of consciousness, subsequent encounter  S06.9X9D     2. MDD (major depressive disorder), recurrent episode, mild (HCC)  F33.0     3. High risk medication use  Z79.899 Hepatic function panel      Past Psychiatric History: Please see initial evaluation for full details. I have reviewed the history. No updates at this time.     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   Chronic left shoulder pain 04/03/2016   Chronic prostatitis 07/11/2013   Cognitive deficit as late effect of traumatic brain injury (HCC)  03/06/2016   Coronary artery disease    Dementia (HCC)    Depression    Disorder of male genital organs 07/11/2013   Dyspnea    Encounter for long-term (current) use of medications 11/21/2014   Erectile dysfunction 07/11/2013   Executive function deficit 03/06/2016   GERD (gastroesophageal reflux disease)    Headache    stress   Hearing loss    Hyperlipidemia    Hypertension    Hypogonadism, 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   Major depression, recurrent, chronic (HCC) 03/06/2016   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   Mixed hyperlipidemia 02/02/2014   Myocardial infarction (HCC) 08/2013   OCD (obsessive compulsive  disorder)    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   Other specified disorder of male genital organs(608.89) 07/11/2013   Prostatic hypertrophy    Pseudophakia of both eyes 04/20/2015   Raynaud's disease    Reduced libido 07/11/2013   Repeated falls    weak left ankle   Scoliosis    Seizures (HCC)    1 time only years ago   Stroke (HCC) 05/2014   light   Synovitis    knees, ankles   TBI (traumatic brain injury) (HCC)    hx 4 TBI's. last one was 07/2022. from a fall.    Past Surgical History:  Procedure Laterality Date   BACK SURGERY  2011   rods and screws   CLOSED REDUCTION NASAL FRACTURE Bilateral 06/11/2018   Procedure: CLOSED REDUCTION NASAL FRACTURE;  Surgeon: Edda Mt, MD;  Location: [facility];  Service: ENT;  Laterality: Bilateral;   COLONOSCOPY  2015   CORONARY ANGIOPLASTY     2015 stent   CORONARY STENT INTERVENTION N/A 06/08/2019   Procedure: CORONARY STENT INTERVENTION;  Surgeon: Florencio Cara BIRCH, MD;  Location: [facility];  Service: Cardiovascular;  Laterality: N/A;   CYSTOSCOPY  1984   EYE SURGERY Bilateral 2005,2006,2009   detached retina, cataract   FOOT ARTHRODESIS Left 05/30/2015   Procedure: ARTHRODESIS FOOT STJ LT FOOT;  Surgeon: Eva Gay, DPM;  Location: [facility];  Service: Podiatry;  Laterality: Left;  WITH POPLITEAL BLOCK   FOOT SURGERY Left    HERNIA REPAIR Right 1969   inguinal   IR GASTROSTOMY TUBE MOD SED  09/22/2023   LEFT HEART CATH AND CORONARY ANGIOGRAPHY Left 06/08/2019   Procedure: LEFT HEART CATH AND CORONARY ANGIOGRAPHY;  Surgeon: Florencio Cara BIRCH, MD;  Location: [facility];  Service: Cardiovascular;  Laterality: Left;   SEPTOPLASTY Bilateral 06/11/2018   Procedure: SEPTOPLASTY;  Surgeon: Edda Mt, MD;  Location: [facility];  Service: ENT;  Laterality: Bilateral;   SHOULDER SURGERY Right 2004   skull surgery     TOE SURGERY Right 2009   TONSILLECTOMY  2008   TRACHEOSTOMY TUBE PLACEMENT N/A 09/23/2023    Procedure: CREATION, TRACHEOSTOMY;  Surgeon: Rumalda Massie RAMAN, MD;  Location: [facility];  Service: ENT;  Laterality: N/A;    Family Psychiatric History: Please see initial evaluation for full details. I have reviewed the history. No updates at this time.     Family History:  Family History  Problem Relation Age of Onset   Asperger's syndrome Mother     Social History:  Social History   Socioeconomic History   Marital status: Married    Spouse name: cindy   Number of children: 1  Years of education: Not on file   Highest education level: Professional school degree (e.g., MD, DDS, DVM, JD)  Occupational History   Not on file  Tobacco Use   Smoking status: Never   Smokeless tobacco: Never  Vaping Use   Vaping status: Never Used  Substance and Sexual Activity   Alcohol use: No   Drug use: No   Sexual activity: Not Currently    Birth control/protection: None  Other Topics Concern   Not on file  Social History Narrative   Not on file   Social Drivers of Health   Financial Resource Strain: Low Risk  (11/09/2023)   Received from [facility]   Overall Financial Resource Strain (CARDIA)    Difficulty of Paying Living Expenses: Not hard at all  Food Insecurity: No Food Insecurity (11/09/2023)   Received from [facility]   Hunger Vital Sign    Within the past 12 months, you worried that your food would run out before you got the money to buy more.: Never true    Within the past 12 months, the food you bought just didn't last and you didn't have money to get more.: Never true  Transportation Needs: No Transportation Needs (11/09/2023)   Received from [facility] - Transportation    In the past 12 months, has lack of transportation kept you from medical appointments or from getting medications?: No    Lack of Transportation (Non-Medical): No  Physical Activity: Not on file  Stress: Not on file  Social Connections:  Patient Unable To Answer (09/15/2023)   Social Connection and Isolation Panel    Frequency of Communication with Friends and Family: Patient unable to answer    Frequency of Social Gatherings with Friends and Family: Patient unable to answer    Attends Religious Services: Patient unable to answer    Active Member of Clubs or Organizations: Patient unable to answer    Attends Banker Meetings: Patient unable to answer    Marital Status: Patient unable to answer    Allergies:  Allergies  Allergen Reactions   Promethazine Other (See Comments)    Severe neuroleptic malignant syndrome requiring intubation   Mirtazapine Other (See Comments)    Other reaction(s): Other (See Comments) Irritability/anger, increased appetite, worsening depression Irritability/anger, increased appetite, worsening depression     Metabolic Disorder Labs: No results found for: HGBA1C, MPG No results found for: PROLACTIN Lab Results  Component Value Date   CHOL 112 05/23/2014   TRIG 150 (H) 09/24/2023   HDL 41 05/23/2014   VLDL 16 05/23/2014   LDLCALC 55 05/23/2014   LDLCALC 124 (H) 09/13/2013   Lab Results  Component Value Date   TSH 1.658 09/15/2023   TSH 3.62 09/13/2013    Therapeutic Level Labs: No results found for: LITHIUM Lab Results  Component Value Date   VALPROATE 28 (L) 11/05/2023   VALPROATE 49 (L) 09/14/2023   No results found for: CBMZ  Current Medications: Current Outpatient Medications  Medication Sig Dispense Refill   acetaminophen  (TYLENOL ) 500 MG tablet Place 2 tablets (1,000 mg total) into feeding tube every 6 (six) hours. 30 tablet 0   acetaminophen  (TYLENOL ) 500 MG tablet Take 2 tablets (1,000 mg total) by mouth every 6 (six) hours as needed. 100 tablet 2   amiodarone  (PACERONE ) 400 MG tablet Place 1 tablet (400 mg total) into feeding tube daily.     atorvastatin  (LIPITOR) 40 MG tablet Place  1 tablet (40 mg total) into feeding tube daily.      divalproex  (DEPAKOTE  ER) 250 MG 24 hr tablet Take 1 tablet (250 mg total) by mouth daily. Total of 750 mg daily. Take along with 500 mg tab 30 tablet 0   divalproex  (DEPAKOTE  ER) 500 MG 24 hr tablet Take 1 tablet (500 mg total) by mouth daily. Total of 750 mg daily. Take along with 250 mg tab 30 tablet 0   docusate (COLACE) 50 MG/5ML liquid Place 10 mLs (100 mg total) into feeding tube 2 (two) times daily. 100 mL 0   DULoxetine  (CYMBALTA ) 60 MG capsule Take 1 capsule (60 mg total) by mouth 2 (two) times daily. 60 capsule 3   enoxaparin  (LOVENOX ) 40 MG/0.4ML injection Inject 0.4 mLs (40 mg total) into the skin daily.     fentaNYL  (SUBLIMAZE ) 50 MCG/ML injection Inject 1 mL (50 mcg total) into the vein every 3 (three) hours as needed (to maintain RASS & CPOT goal.).     furosemide  (LASIX ) 20 MG tablet Place 1 tablet (20 mg total) into feeding tube daily.     heparin  25000 UT/250ML infusion Inject 1,900 Units/hr into the vein continuous.     latanoprost  (XALATAN ) 0.005 % ophthalmic solution Place 1 drop into the left eye at bedtime. 2.5 mL 12   losartan  (COZAAR ) 25 MG tablet Place 1 tablet (25 mg total) into feeding tube daily.     losartan  (COZAAR ) 50 MG tablet Place 1 tablet (50 mg total) into feeding tube daily.     metoprolol  tartrate (LOPRESSOR ) 25 MG tablet Place 1 tablet (25 mg total) into feeding tube 2 (two) times daily.     metoprolol  tartrate (LOPRESSOR ) 50 MG tablet Place 1 tablet (50 mg total) into feeding tube 2 (two) times daily.     Mouthwashes (MOUTH RINSE) LIQD solution 15 mLs by Mouth Rinse route every 2 (two) hours.     Mouthwashes (MOUTH RINSE) LIQD solution 15 mLs by Mouth Rinse route as needed (oral care).     naphazoline-glycerin  (CLEAR EYES REDNESS) 0.012-0.25 % SOLN Place 1-2 drops into both eyes 4 (four) times daily as needed for eye irritation.     Nutritional Supplements (FEEDING SUPPLEMENT, VITAL 1.5 CAL,) LIQD Place 1,000 mLs into feeding tube continuous.     oxyCODONE   (OXY IR/ROXICODONE ) 5 MG immediate release tablet Place 1 tablet (5 mg total) into feeding tube every 6 (six) hours. 30 tablet 0   pantoprazole  (PROTONIX ) 40 MG injection Inject 40 mg into the vein at bedtime.     piperacillin -tazobactam (ZOSYN ) 3.375 GM/50ML IVPB Inject 50 mLs (3.375 g total) into the vein every 8 (eight) hours.     polyethylene glycol (MIRALAX  / GLYCOLAX ) 17 g packet Place 17 g into feeding tube 2 (two) times daily. 14 each 0   prednisoLONE  acetate (PRED FORTE ) 1 % ophthalmic suspension Place 1 drop into the right eye daily. 5 mL 0   Protein (FEEDING SUPPLEMENT, PROSOURCE TF20,) liquid Place 60 mLs into feeding tube daily.     QUEtiapine  (SEROQUEL ) 25 MG tablet Place 1 tablet (25 mg total) into feeding tube at bedtime. (Patient taking differently: Place 50 mg into feeding tube at bedtime.)     QUEtiapine  (SEROQUEL ) 50 MG tablet Take 1 tablet (50 mg total) by mouth at bedtime. 30 tablet 3   spironolactone  (ALDACTONE ) 25 MG tablet Place 0.5 tablets (12.5 mg total) into feeding tube daily.     spironolactone  (ALDACTONE ) 25 MG tablet Place 1 tablet (  25 mg total) into feeding tube daily.     valproic  acid (DEPAKENE ) 250 MG/5ML solution Place 5 mLs (250 mg total) into feeding tube 3 (three) times daily.     No current facility-administered medications for this visit.     Musculoskeletal: Strength & Muscle Tone: within normal limits Gait & Station: slow Patient leans: N/A  Psychiatric Specialty Exam: Review of Systems  Psychiatric/Behavioral:  Positive for dysphoric mood and sleep disturbance. Negative for agitation, behavioral problems, confusion, decreased concentration, hallucinations, self-injury and suicidal ideas. The patient is not nervous/anxious and is not hyperactive.   All other systems reviewed and are negative.   Blood pressure 110/76, pulse 77, temperature 98.6 F (37 C), temperature source Temporal, height 5' 7 (1.702 m), weight 183 lb 6.4 oz (83.2 kg), SpO2  100%.Body mass index is 28.72 kg/m.  General Appearance: Well Groomed  Eye Contact:  Good  Speech:  Clear and Coherent  Volume:  Normal  Mood:  good  Affect:  Appropriate, Congruent, and calm  Thought Process:  Coherent  Orientation:  Full (Time, Place, and Person)  Thought Content: Logical   Suicidal Thoughts:  No  Homicidal Thoughts:  No  Memory:  Immediate;   Good  Judgement:  Good  Insight:  Fair  Psychomotor Activity:  Normal, Normal tone, no rigidity, no resting/postural tremors, no tardive dyskinesia    Concentration:  Concentration: Fair and Attention Span: Fair  Recall:  Fair  Fund of Knowledge: Good  Language: Good  Akathisia:  No  Handed:  Right  AIMS (if indicated): 0   Assets:  Social Support  ADL's:  Intact  Cognition: WNL  Sleep:  Fair   Screenings: GAD-7    Flowsheet Row Office Visit from [facility]/04/20[facility]  Total GAD-7 Score 0   PHQ2-9    Flowsheet Row Office Visit from 08/04/2023 in [facility]  PHQ-2 Total Score 0   Flowsheet Row ED from 11/29/2023 in [facility] at [facility] ED to Hosp-Admission (Discharged) from 09/14/2023 in [facility] ICU[facility]CCU Office Visit from 08/04/2023 in [facility]  C-SSRS RISK CATEGORY No Risk No Risk No Risk     Assessment and Plan:  KAMAREE WHEATLEY [name] is a 73 y.o. year old male with a history of TBI, hypertension, bradycardia s/p ICD, SVT, CAD, CHF, ischemic cardiomyopathy, hyperlipidemia, OSA on oxygen, hypogonadism on testosterone , COVID infection in Sept 2023, who presents for follow up appointment for below.  1. Traumatic brain injury with loss of consciousness, subsequent encounter Functional Status   IADL: Independent in the following: managing:            Requires assistance with the following: cooking, medications ADL  Independent in the  following: bathing and hygiene, feeding, continence, grooming and toileting, walking          Requires assistance with the following: Folate, Vitamin B12, TSH Images: Head CT 11/2023 Brain: Marked anterior inferior bilateral frontal and anterior bilateral temporal lobe encephalomalacia. Left cerebellar encephalomalacia. Patchy and confluent areas of decreased attenuation are noted Neuropsych assessment: reportedly had neuropsych testing, but unable to locate this Etiology:   The exam is notable for improvement in irrelevant thought process, and he remains calm and alert through the entire visit.  There has been steady improvement in irritability since being on the current combination of medication.  Will continue current dose of Depakote  to target emotional dysregulation associated with TBI,  along with quetiapine  for the same. Discussed potential metabolic side effect, EPS, QTc prolongation, and risk of increased mortality for people with dementia.     2. MDD (major depressive disorder), recurrent episode, mild (HCC) There has been overall improvement in depressive symptoms since switching back from fluoxetine  to duloxetine .  Notably, he has been taking the first dose already in the morning, and finds it beneficial.  Will maintain the same regimen at this time to target depression.  Discussed potential risk of insomnia.   3. High risk medication use History: Dx with depression in 1991, being on duloxetine  60 mg BID since then   Will recheck LFT as he is on depakote .        Last checked  EKG HR 88, QTc42msec Sinus rhythm, ventricular bigeminy 08/2023  Lipid panels LDL 142 H  11/2021- Due  HbA1c Glu 118  09/2023   VPA 28 10/2023, CBC wnl except RBC 4.13,     Plan Continue Depakote  ER 750 mg daily  Continue duloxetine  60 mg twice a day Continue Quetiapine  50 mg at night Obtain labs (LFT)  Next appointment-  10/21 at 10:30, IP - on melatonin 10 mg     Collaboration of Care: Collaboration of  Care: Other reviewed notes in Epic  Patient/Guardian was advised Release of Information must be obtained prior to any record release in order to collaborate their care with an outside provider. Patient/Guardian was advised if they have not already done so to contact the registration department to sign all necessary forms in order for us  to release information regarding their care.   Consent: Patient/Guardian gives verbal consent for treatment and assignment of benefits for services provided during this visit. Patient/Guardian expressed understanding and agreed to proceed.    Katheren Sleet, MD 12/15/2023, 10:52 AM

## 2023-12-15 ENCOUNTER — Encounter: Payer: Self-pay | Admitting: Psychiatry

## 2023-12-15 ENCOUNTER — Ambulatory Visit: Payer: Self-pay | Admitting: Psychiatry

## 2023-12-15 ENCOUNTER — Other Ambulatory Visit
Admission: RE | Admit: 2023-12-15 | Discharge: 2023-12-15 | Disposition: A | Discharge status: Home / Self Care | Source: Ambulatory Visit | Attending: Psychiatry | Admitting: Psychiatry

## 2023-12-15 ENCOUNTER — Ambulatory Visit (INDEPENDENT_AMBULATORY_CARE_PROVIDER_SITE_OTHER): Admitting: Psychiatry

## 2023-12-15 VITALS — BP 110/76 | HR 77 | Temp 98.6°F | Ht 67.0 in | Wt 183.4 lb

## 2023-12-15 DIAGNOSIS — Z79899 Other long term (current) drug therapy: Secondary | ICD-10-CM | POA: Diagnosis present

## 2023-12-15 DIAGNOSIS — F330 Major depressive disorder, recurrent, mild: Secondary | ICD-10-CM

## 2023-12-15 DIAGNOSIS — S069X9D Unspecified intracranial injury with loss of consciousness of unspecified duration, subsequent encounter: Secondary | ICD-10-CM

## 2023-12-15 LAB — HEPATIC FUNCTION PANEL
ALT: 19 U/L (ref 0–44)
AST: 22 U/L (ref 15–41)
Albumin: 3.8 g/dL (ref 3.5–5.0)
Alkaline Phosphatase: 52 U/L (ref 38–126)
Bilirubin, Direct: 0.1 mg/dL (ref 0.0–0.2)
Indirect Bilirubin: 0.8 mg/dL (ref 0.3–0.9)
Total Bilirubin: 0.9 mg/dL (ref 0.0–1.2)
Total Protein: 7 g/dL (ref 6.5–8.1)

## 2023-12-15 NOTE — Progress Notes (Signed)
 Called patient to inform of the lab results no answer left voicemail for patient to return call to office

## 2023-12-15 NOTE — Patient Instructions (Addendum)
 Continue Depakote  ER 750 mg daily  Continue duloxetine  60 mg twice a day Continue Quetiapine  50 mg at night Obtain labs (LFT)  Next appointment-  10/21 at 10:30

## 2023-12-15 NOTE — Progress Notes (Signed)
 Please inform the patient/his wife that liver function tests are within normal limits. Please advise continuation of the current medication regimen as discussed earlier.

## 2023-12-17 ENCOUNTER — Other Ambulatory Visit: Payer: Self-pay | Admitting: Psychiatry

## 2023-12-19 ENCOUNTER — Other Ambulatory Visit: Payer: Self-pay | Admitting: Urology

## 2023-12-19 ENCOUNTER — Other Ambulatory Visit: Payer: Self-pay | Admitting: Psychiatry

## 2023-12-29 NOTE — Progress Notes (Signed)
 Subjective:   CC: Granulation tissue of site of tracheostomy [L92.9, Z98.890]  HPI:  referred by Margaretann Velia Seip, NP for evaluation of above.   History of Present Illness Trach removed at [facility] some time ago but dealing with persistent drainage from area.  Some TTP as well.    Also still having intermittent pain at former gtube site.  Denies any drainage from there.    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  Past Surgical History:  has a past surgical history that includes ankle suregry (Left); Hernia repair; Spine surgery (12/2009); closed head injury (1991); Arthroscopy Shoulder (Right); toe surgery (Right); OD-IOL (11/2003); foot surgery (Left); toe surgery (Left); Tonsillectomy; detached retina (2007-2008); calcanceal fracture (Left, Fall 2011); Back surgery (Fall 2011); cataracts (Bilateral); nose surgery; Eye surgery; heart stent surgery (05/2019); Facial fracture  (05/2018); and repositioning previously implanted transvenous pacemaker or icd electrode (11/2021).  Family History: family history includes Acromegaly in an other family member; Arthritis in an other family member; Heart disease in his paternal uncle; High blood pressure (Hypertension) in his father and mother.  Social History:  reports that he has never smoked. He has never been exposed to tobacco smoke. He has  never used smokeless tobacco. He reports that he does not drink alcohol and does not use drugs.  Current Medications: has a current medication list which includes the following prescription(s): acetaminophen , amiodarone , aspirin , atorvastatin , bacillus coagulans, bd luer-lok syringe, calcium  carbonate-vitamin d3, calcium  cit-mag-d3-zn-cop-mang, carvedilol , chlorhexidine , ciclopirox, clindamycin , clopidogrel , cyanocobalamin, dapagliflozin propanediol, divalproex , divalproex , docusate, doxazosin, duloxetine , ferrous fumarate/vit bcomp,c, furosemide , ketoconazole, l.acid/l.casei/b.bif/b.lon/fos, latanoprost , losartan , melatonin, metoprolol  tartrate, omeprazole, prednisolone  acetate, promethazine-dextromethorphan, propylene glycol, psyllium seed (with sugar), quetiapine , silodosin , spironolactone , tadalafil , testosterone  cypionate, vanishpoint syringe, and vitamin b complex.  Allergies:  Allergies  Allergen Reactions  . Promethazine Other (See Comments)  . Mirtazapine Other (See Comments)    Irritability/anger, increased appetite, worsening depression    ROS:  A 15 point review of systems was performed and pertinent positives and negatives noted in HPI   Objective:     BP 104/66   Pulse 76   Ht 166.4 cm (5' 5.5)   Wt 83.7 kg (184 lb 8 oz)   BMI 30.24 kg/m   Constitutional :  No distress, cooperative, alert  Lymphatics/Throat:  Supple with no lymphadenopathy  Respiratory:  Clear to auscultation bilaterally  Cardiovascular:  Regular rate and rhythm  Gastrointestinal: Soft, non-tender, non-distended, no organomegaly.  Musculoskeletal: Steady gait and movement  Skin: Cool and moist, trach site with some exudative drainage and skin irritation from adhesives, but no erythema, induration, purulent drainage to indicate infection.  Granulomatous tissue covering likely a still persistent pinhole opening in area.  Gtube site scarring well, no sign of infection.  Psychiatric: Normal affect,  non-agitated, not confused         LABS:  N/a   RADS: N/a  Assessment:      Granulation tissue of site of tracheostomy [L92.9, Z98.890]  Plan:     1. Granulation tissue of site of tracheostomy [L92.9, Z98.890] silver nitrate applied to granuloma at trach site after verbal consent.  Pt  tolerated procedure well.  Area covered with gauze and tape.    Will arrange f/u with ENT for continued trach site care.  Original trach placed by Dr. Rumalda 09/2023.  Followup two weeks in our office if you cannot be seen by ENT before then.  Continue current dressings around the area in the meantime.  Former gtube site healing well. Reassured patient area will continue to heal without any issues.  labs/images/medications/previous chart entries reviewed personally and relevant changes/updates noted above.

## 2024-01-14 ENCOUNTER — Other Ambulatory Visit: Payer: Self-pay | Admitting: Neurology

## 2024-01-14 ENCOUNTER — Ambulatory Visit
Admission: RE | Admit: 2024-01-14 | Discharge: 2024-01-14 | Disposition: A | Discharge status: Home / Self Care | Source: Ambulatory Visit | Attending: Neurology | Admitting: Neurology

## 2024-01-14 DIAGNOSIS — S0990XA Unspecified injury of head, initial encounter: Secondary | ICD-10-CM

## 2024-01-19 ENCOUNTER — Other Ambulatory Visit: Payer: Self-pay | Admitting: Neurology

## 2024-01-19 DIAGNOSIS — R4189 Other symptoms and signs involving cognitive functions and awareness: Secondary | ICD-10-CM

## 2024-01-19 DIAGNOSIS — S0990XA Unspecified injury of head, initial encounter: Secondary | ICD-10-CM

## 2024-01-29 ENCOUNTER — Other Ambulatory Visit: Payer: Self-pay | Admitting: Psychiatry

## 2024-02-06 NOTE — Progress Notes (Unsigned)
 [facility] MD/PA/NP OP Progress Note  02/09/2024 12:11 PM Blake [name]  MRN:  [id number]  Chief Complaint:  Chief Complaint  Patient presents with   Follow-up   HPI:  - since the last visit, he was seen by neurology, Allyson Cordella Stallion, NP. EEG was scheduled.  - chart reviewed. No meds changed by cardiologist.   This is a follow-up appointment for TBI, depression.  He states that his mood is not bad.  He finds duloxetine  to be very helpful.  He goes to the methadone house, release newspaper.  He does what [name] told him to do.  He is out every day for lunch.  He denies feeling upset (when he was initially asked about irritability, he responded that depression is controlled).  Although he misses normalcy for the last 35 years, he denies much concern.  He denies SI, HI, hallucinations.   [name], his wife presents to the visit with the patient consent.  She states that he has been going to the [facility] 3 times a week.  There are 60 people in the class, and they have social activities. He goes to church on Sundays and goes to once a day service.  They go to the park. Although PT released him, he had a fall.  He had 8 falls over the past several months.  He has some dizziness when he stands up quickly.  The nurse will come for another assessment today.  He was seen by neurologist and completed EEG, although they have not heard about the results.  He was recommended by gastroenterologist not to eat at night due to his symptoms.  They have been working on hydration.  Send he thinks the current medication regimen has been working very well.  Although he had a tiny irritability, no other concern at this time.   Wt Readings from Last 3 Encounters:  02/09/24 189 lb 9.6 oz (86 kg)  12/15/23 183 lb 6.4 oz (83.2 kg)  12/11/23 188 lb (85.3 kg)      Support: wife Household: wife of 25 years Marital status: married Number of children: Employment: International aid/development worker, retired in 2012.  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  Education:  21 years of education  Visit Diagnosis:    ICD-10-CM   1. Traumatic brain injury with loss of consciousness, subsequent encounter  S06.9X9D     2. MDD (major depressive disorder), recurrent episode, mild  F33.0     3. High risk medication use  Z79.899       Past Psychiatric History: Please see initial evaluation for full details. I have reviewed the history. No updates at this time.     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   Coronary artery disease    Dementia (HCC)    Depression    Disorder of male genital organs 07/11/2013   Dyspnea    Encounter for long-term (current) use of medications 11/21/2014   Erectile dysfunction 07/11/2013   Executive function deficit 03/06/2016   GERD (gastroesophageal reflux disease)    Headache  stress   Hearing loss    Hyperlipidemia    Hypertension    Hypogonadism, 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   Major depression, recurrent, chronic 03/06/2016   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 last one was 07/2022. from a fall.    Past Surgical History:  Procedure Laterality Date   BACK SURGERY  2011   rods and screws   CLOSED REDUCTION NASAL FRACTURE Bilateral 06/11/2018   Procedure: CLOSED REDUCTION NASAL FRACTURE;  Surgeon: Edda Mt, MD;  Location: [facility] OR[facility];  Service: ENT;  Laterality: Bilateral;   COLONOSCOPY  2015   CORONARY ANGIOPLASTY     2015 stent   CORONARY STENT INTERVENTION N/A 06/08/2019   Procedure: CORONARY STENT INTERVENTION;  Surgeon: Florencio Cara BIRCH, MD;  Location: [facility];  Service: Cardiovascular;  Laterality: N/A;   CYSTOSCOPY  1984   EYE SURGERY Bilateral 2005,2006,2009   detached retina, cataract   FOOT ARTHRODESIS Left 05/30/2015   Procedure: ARTHRODESIS FOOT STJ LT FOOT;  Surgeon: Eva Gay, DPM;  Location: [facility];  Service: Podiatry;  Laterality: Left;  WITH POPLITEAL BLOCK   FOOT SURGERY Left    HERNIA REPAIR Right 1969   inguinal   IR GASTROSTOMY TUBE MOD SED  09/22/2023   LEFT HEART CATH AND CORONARY ANGIOGRAPHY Left 06/08/2019   Procedure: LEFT HEART CATH AND CORONARY ANGIOGRAPHY;  Surgeon: Florencio Cara BIRCH, MD;  Location: [facility];  Service: Cardiovascular;  Laterality: Left;   SEPTOPLASTY Bilateral 06/11/2018   Procedure: SEPTOPLASTY;  Surgeon: Edda Mt, MD;  Location: [facility];  Service: ENT;  Laterality: Bilateral;   SHOULDER SURGERY Right 2004   skull surgery     TOE SURGERY Right 2009   TONSILLECTOMY  2008   TRACHEOSTOMY TUBE PLACEMENT N/A 09/23/2023   Procedure: CREATION, TRACHEOSTOMY;  Surgeon: Rumalda Massie RAMAN, MD;  Location: [facility];  Service: ENT;  Laterality: N/A;    Family Psychiatric History: Please see initial evaluation for full details. I have reviewed the history. No updates at this time.     Family History:  Family  History  Problem Relation Age of Onset   Asperger's syndrome Mother     Social History:  Social History   Socioeconomic History   Marital status: Married    Spouse name: cindy   Number of children: 1   Years of education: Not on file   Highest education level: Professional school degree (e.g., MD, DDS, DVM, JD)  Occupational History   Not on file  Tobacco Use   Smoking status: Never   Smokeless tobacco: Never  Vaping Use   Vaping status: Never Used  Substance and Sexual Activity   Alcohol use: No   Drug use:  No   Sexual activity: Not Currently    Birth control/protection: None  Other Topics Concern   Not on file  Social History Narrative   Not on file   Social Drivers of Health   Financial Resource Strain: Low Risk  (11/09/2023)   Received [facility]   Overall Financial Resource Strain (CARDIA)    Difficulty of Paying Living Expenses: Not hard at all  Food Insecurity: No Food Insecurity (11/09/2023)   Received from [facility]   Hunger Vital Sign    Within the past 12 months, you worried that your food would run out before you got the money to buy more.: Never true    Within the past 12 months, the food you bought just didn't last and you didn't have money to get more.: Never true  Transportation Needs: No Transportation Needs (11/09/2023)   Received from [facility] - Transportation    In the past 12 months, has lack of transportation kept you from medical appointments or from getting medications?: No    Lack of Transportation (Non-Medical): No  Physical Activity: Not on file  Stress: Not on file  Social Connections: Patient Unable To Answer (09/15/2023)   Social Connection and Isolation Panel    Frequency of Communication with Friends and Family: Patient unable to answer    Frequency of Social Gatherings with Friends and Family: Patient unable to answer    Attends Religious Services: Patient  unable to answer    Active Member of Clubs or Organizations: Patient unable to answer    Attends Banker Meetings: Patient unable to answer    Marital Status: Patient unable to answer    Allergies:  Allergies  Allergen Reactions   Promethazine Other (See Comments)    Severe neuroleptic malignant syndrome requiring intubation   Mirtazapine Other (See Comments)    Other reaction(s): Other (See Comments) Irritability/anger, increased appetite, worsening depression Irritability/anger, increased appetite, worsening depression     Metabolic Disorder Labs: No results found for: HGBA1C, MPG No results found for: PROLACTIN Lab Results  Component Value Date   CHOL 112 05/23/2014   TRIG 150 (H) 09/24/2023   HDL 41 05/23/2014   VLDL 16 05/23/2014   LDLCALC 55 05/23/2014   LDLCALC 124 (H) 09/13/2013   Lab Results  Component Value Date   TSH 1.658 09/15/2023   TSH 3.62 09/13/2013    Therapeutic Level Labs: No results found for: LITHIUM Lab Results  Component Value Date   VALPROATE 28 (L) 11/05/2023   VALPROATE 49 (L) 09/14/2023   No results found for: CBMZ  Current Medications: Current Outpatient Medications  Medication Sig Dispense Refill   acetaminophen  (TYLENOL ) 500 MG tablet Place 2 tablets (1,000 mg total) into feeding tube every 6 (six) hours. 30 tablet 0   acetaminophen  (TYLENOL ) 500 MG tablet Take 2 tablets (1,000 mg total) by mouth every 6 (six) hours as needed. 100 tablet 2   amiodarone  (PACERONE ) 400 MG tablet Place 1 tablet (400 mg total) into feeding tube daily.     atorvastatin  (LIPITOR) 40 MG tablet Place 1 tablet (40 mg total) into feeding tube daily.     [START ON 02/28/2024] divalproex  (DEPAKOTE  ER) 250 MG 24 hr tablet Take 1 tablet (250 mg total) by mouth daily. Total of 750 mg daily. Take along with 500 mg tab 90 tablet 0   [START ON 02/22/2024] divalproex  (DEPAKOTE  ER) 500 MG 24 hr tablet Take 1 tablet (  500 mg total) by mouth daily.  Total of 750 mg daily. Take along with 250 mg tab 90 tablet 0   docusate (COLACE) 50 MG/5ML liquid Place 10 mLs (100 mg total) into feeding tube 2 (two) times daily. 100 mL 0   DULoxetine  (CYMBALTA ) 60 MG capsule Take 1 capsule (60 mg total) by mouth 2 (two) times daily. 60 capsule 3   enoxaparin  (LOVENOX ) 40 MG/0.4ML injection Inject 0.4 mLs (40 mg total) into the skin daily.     fentaNYL  (SUBLIMAZE ) 50 MCG/ML injection Inject 1 mL (50 mcg total) into the vein every 3 (three) hours as needed (to maintain RASS & CPOT goal.).     furosemide  (LASIX ) 20 MG tablet Place 1 tablet (20 mg total) into feeding tube daily.     heparin  25000 UT/250ML infusion Inject 1,900 Units/hr into the vein continuous.     latanoprost  (XALATAN ) 0.005 % ophthalmic solution Place 1 drop into the left eye at bedtime. 2.5 mL 12   losartan  (COZAAR ) 25 MG tablet Place 1 tablet (25 mg total) into feeding tube daily.     losartan  (COZAAR ) 50 MG tablet Place 1 tablet (50 mg total) into feeding tube daily.     metoprolol  tartrate (LOPRESSOR ) 25 MG tablet Place 1 tablet (25 mg total) into feeding tube 2 (two) times daily.     metoprolol  tartrate (LOPRESSOR ) 50 MG tablet Place 1 tablet (50 mg total) into feeding tube 2 (two) times daily.     Mouthwashes (MOUTH RINSE) LIQD solution 15 mLs by Mouth Rinse route every 2 (two) hours.     Mouthwashes (MOUTH RINSE) LIQD solution 15 mLs by Mouth Rinse route as needed (oral care).     naphazoline-glycerin  (CLEAR EYES REDNESS) 0.012-0.25 % SOLN Place 1-2 drops into both eyes 4 (four) times daily as needed for eye irritation.     Nutritional Supplements (FEEDING SUPPLEMENT, VITAL 1.5 CAL,) LIQD Place 1,000 mLs into feeding tube continuous.     oxyCODONE  (OXY IR/ROXICODONE ) 5 MG immediate release tablet Place 1 tablet (5 mg total) into feeding tube every 6 (six) hours. 30 tablet 0   pantoprazole  (PROTONIX ) 40 MG injection Inject 40 mg into the vein at bedtime.     piperacillin -tazobactam (ZOSYN )  3.375 GM/50ML IVPB Inject 50 mLs (3.375 g total) into the vein every 8 (eight) hours.     polyethylene glycol (MIRALAX  / GLYCOLAX ) 17 g packet Place 17 g into feeding tube 2 (two) times daily. 14 each 0   prednisoLONE  acetate (PRED FORTE ) 1 % ophthalmic suspension Place 1 drop into the right eye daily. 5 mL 0   Protein (FEEDING SUPPLEMENT, PROSOURCE TF20,) liquid Place 60 mLs into feeding tube daily.     [START ON 03/16/2024] QUEtiapine  (SEROQUEL ) 50 MG tablet Take 1 tablet (50 mg total) by mouth at bedtime. 90 tablet 0   spironolactone  (ALDACTONE ) 25 MG tablet Place 0.5 tablets (12.5 mg total) into feeding tube daily.     spironolactone  (ALDACTONE ) 25 MG tablet Place 1 tablet (25 mg total) into feeding tube daily.     tadalafil  (CIALIS ) 5 MG tablet Take 1 tablet by mouth once daily 90 tablet 0   valproic  acid (DEPAKENE ) 250 MG/5ML solution Place 5 mLs (250 mg total) into feeding tube 3 (three) times daily.     No current facility-administered medications for this visit.     Musculoskeletal: Strength & Muscle Tone: within normal limits Gait & Station: slow (without a walker) Patient leans: N/A  Psychiatric Specialty Exam: Review  of Systems  Psychiatric/Behavioral:  Negative for agitation, behavioral problems, confusion, decreased concentration, dysphoric mood, hallucinations, self-injury, sleep disturbance and suicidal ideas. The patient is not nervous/anxious and is not hyperactive.   All other systems reviewed and are negative.   Blood pressure 104/68, pulse 70, temperature (!) 97.3 F (36.3 C), temperature source Temporal, height 5' 7 (1.702 m), weight 189 lb 9.6 oz (86 kg).Body mass index is 29.7 kg/m.  General Appearance: Well Groomed  Eye Contact:  Good  Speech:  Clear and Coherent  Volume:  Normal  Mood:  good  Affect:  Appropriate, Congruent, and calm  Thought Process:  Coherent  Orientation:  Full (Time, Place, and Person)  Thought Content: Logical   Suicidal Thoughts:   No  Homicidal Thoughts:  No  Memory:  Immediate;   Fair  Judgement:  Good  Insight:  Fair  Psychomotor Activity:  Normal, Normal tone, no rigidity, no resting/postural tremors, no tardive dyskinesia    Concentration:  Concentration: Fair and Attention Span: Fair  Recall:  [employer] of Knowledge: Good  Language: Good  Akathisia:  Negative  Handed:  Right  AIMS (if indicated): 0   Assets:  Social Support  ADL's:  Intact  Cognition: WNL  Sleep:  Good   Screenings: GAD-7    Flowsheet Row Office Visit from 08/04/2023 in [facility]  Total GAD-7 Score 0   PHQ2-9    Flowsheet Row Office Visit from 08/04/2023 in [facility]  PHQ-2 Total Score 0   Flowsheet Row ED from 11/29/2023 in [facility] Emergency Department at [facility] ED to Hosp-Admission (Discharged) from 09/14/2023 in [facility] ICU/CCU Office Visit from 08/04/2023 in [facility]  C-SSRS RISK CATEGORY No Risk No Risk No Risk     Assessment and Plan:  RANDEEP BIONDOLILLO [name] is a 73 y.o. year old male with a history of TBI, hypertension, bradycardia s/p ICD, SVT, CAD, CHF, ischemic cardiomyopathy, hyperlipidemia, OSA on oxygen, hypogonadism on testosterone , COVID infection in Sept 2023, who presents for follow up appointment for below.   1. Traumatic brain injury with loss of consciousness, subsequent encounter Functional Status   IADL: Independent in the following: managing:            Requires assistance with the following: cooking, medications ADL  Independent in the following: bathing and hygiene, feeding, continence, grooming and toileting, walking          Requires assistance with the following: Folate, Vitamin B12, TSH Images: Head CT 12/2023 Brain: Brain: Ventricles and cisterns within normal and unchanged. Bifrontal temporal encephalomalacia unchanged. Chronic ischemic  microvascular disease is present. No mass, mass effect, shift of midline structures or acute hemorrhage. Old left cerebellar infarct Neuropsych assessment: reportedly had neuropsych testing, but unable to locate this Etiology:   He demonstrates linear thought process most of the time, and he remains calm and alert through the entire visit.  According to the collateral from his wife, he did not have any outburst except minor irritability, and has been doing very well on the current medication regimen.  Although there is some mild concern of drowsiness, will maintain on the current medication given they will reach out to his primary care provider regarding his relatively low blood pressure.  Will continue current dose of Depakote  to target emotional dysregulation associated with TBI, along with quetiapine  for the same. Discussed potential metabolic side effect, EPS, QTc prolongation, and risk  of increased mortality for people with dementia.  Will consider lowering the dose if any worsening in dizziness.   2. MDD (major depressive disorder), recurrent episode, mild Stable.  He reports good benefit from duloxetine ,  which was switched back from fluoxetine .  Will continue the same regimen to target depression.   3. High risk medication use History: Dx with depression in 1991, being on duloxetine  60 mg BID since then    We will recheck Depakote , platelet given he is on Depakote .       Last checked  EKG HR 88, QTc472msec Sinus rhythm, ventricular bigeminy 08/2023  Lipid panels LDL 142 H  11/2021- Due  HbA1c Glu 118  09/2023   VPA 28 10/2023, CBC wnl except RBC 4.13 11/2023, LFT wnl 12/2023  # low BP He reports occasional dizziness, and his blood pressure today is relatively low.  His home BP is reportedly in a good range.  His wife agrees to recheck this with his home nurse, and consult primary care as needed for possible medication adjustment.     Plan Continue Depakote  ER 750 mg daily  Continue  duloxetine  60 mg twice a day Continue Quetiapine  50 mg at night Obtain labs (VPA, CBC) Next appointment-   1/12 at 1 pm, IP - on melatonin 10 mg     Collaboration of Care: Collaboration of Care: Other reviewed notes in Epic  Patient/Guardian was advised Release of Information must be obtained prior to any record release in order to collaborate their care with an outside provider. Patient/Guardian was advised if they have not already done so to contact the registration department to sign all necessary forms in order for us  to release information regarding their care.   Consent: Patient/Guardian gives verbal consent for treatment and assignment of benefits for services provided during this visit. Patient/Guardian expressed understanding and agreed to proceed.    Katheren Sleet, MD 02/09/2024, 12:11 PM

## 2024-02-09 ENCOUNTER — Encounter: Payer: Self-pay | Admitting: Psychiatry

## 2024-02-09 ENCOUNTER — Other Ambulatory Visit: Payer: Self-pay

## 2024-02-09 ENCOUNTER — Ambulatory Visit (INDEPENDENT_AMBULATORY_CARE_PROVIDER_SITE_OTHER): Admitting: Psychiatry

## 2024-02-09 VITALS — BP 104/68 | HR 70 | Temp 97.3°F | Ht 67.0 in | Wt 189.6 lb

## 2024-02-09 DIAGNOSIS — S069X9D Unspecified intracranial injury with loss of consciousness of unspecified duration, subsequent encounter: Secondary | ICD-10-CM

## 2024-02-09 DIAGNOSIS — Z79899 Other long term (current) drug therapy: Secondary | ICD-10-CM | POA: Diagnosis not present

## 2024-02-09 DIAGNOSIS — F330 Major depressive disorder, recurrent, mild: Secondary | ICD-10-CM

## 2024-02-09 MED ORDER — QUETIAPINE FUMARATE 50 MG PO TABS: 50.0000 mg | ORAL_TABLET | Freq: Every day | ORAL | 0 refills | Status: DC

## 2024-02-09 MED ORDER — DIVALPROEX SODIUM ER 500 MG PO TB24: 500.0000 mg | ORAL_TABLET | Freq: Every day | ORAL | 0 refills | Status: DC

## 2024-02-09 MED ORDER — DIVALPROEX SODIUM ER 250 MG PO TB24: 250.0000 mg | ORAL_TABLET | Freq: Every day | ORAL | 0 refills | Status: DC

## 2024-02-09 NOTE — Patient Instructions (Signed)
 Continue Depakote  ER 750 mg daily  Continue duloxetine  60 mg twice a day Continue Quetiapine  50 mg at night Obtain labs (VPA, CBC) at [facility] Next appointment-   1/12 at 1 pm

## 2024-03-11 ENCOUNTER — Other Ambulatory Visit: Payer: Self-pay | Admitting: Urology

## 2024-04-02 ENCOUNTER — Other Ambulatory Visit: Payer: Self-pay | Admitting: Psychiatry

## 2024-04-02 ENCOUNTER — Other Ambulatory Visit: Payer: Self-pay | Admitting: Urology

## 2024-04-26 NOTE — Progress Notes (Signed)
 [facility] MD/PA/NP OP Progress Note  05/02/2024 5:23 PM Blake [name]  MRN:  [id number]  Chief Complaint:  Chief Complaint  Patient presents with   Follow-up   HPI:  This is a follow-up appointment for TBI, depression.  He states that he had flu.  He has been doing well otherwise.  He reports great support from his wife.  He goes to the methadone although with the help of his wife, and drink iced coffee.  He goes to group exercise at [facility].  He takes duloxetine  3 AM and 2:30.  He finds this to be very helpful for his mood, although he sleeps after taking duloxetine .  He talks about the time he was put on defibrillator few years ago.  He denies SI, HI, hallucinations.   Blake [name], his wife presents to the visit.  She states that he has been doing well since being on the current combination of the medication.  He sleeps up to 8 hours and appears to be resting.  She is arranging him to get duloxetine  out of the bed as he had episode of aspiration, trying to take medication while lying in the bed.  She also reports he tends to eat more she will during the midday.  He ate her daughter's banana pudding despite that his name is not on it.  She states that he has always appetite for sweets. She thinks it is slightly worsened to the point that he takes other peoples food. (When he is asked, he reports that he does remember he did it. He talks about the habit growing up).  She denies other concern, and feels comfortable to stay on the current medication.   Wt Readings from Last 3 Encounters:  05/02/24 190 lb (86.2 kg)  02/09/24 189 lb 9.6 oz (86 kg)  12/15/23 183 lb 6.4 oz (83.2 kg)     Support: wife Household: wife of 25 years Marital status: married Number of children: Employment: international aid/development worker, retired in 2012.  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  Education:  21 years of education  Visit Diagnosis:    ICD-10-CM   1. Traumatic brain injury with loss of consciousness, subsequent  encounter  S06.9X9D     2. MDD (major depressive disorder), recurrent, in partial remission  F33.41     3. High risk medication use  Z79.899 Valproic  acid level    CBC    Comprehensive metabolic panel with GFR      Past Psychiatric History: Please see initial evaluation for full details. I have reviewed the history. No updates at this time.     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   Coronary artery disease    Dementia (HCC)    Depression    Disorder of male genital organs 07/11/2013   Dyspnea    Encounter for long-term (current) use of medications 11/21/2014   Erectile dysfunction 07/11/2013   Executive function deficit 03/06/2016   GERD (gastroesophageal reflux disease)    Headache    stress   Hearing loss    Hyperlipidemia  Hypertension    Hypogonadism, 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   Major depression, recurrent, chronic 03/06/2016   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 last one was 07/2022. from a fall.    Past Surgical History:  Procedure Laterality Date   BACK SURGERY  2011   rods and screws   CLOSED REDUCTION NASAL FRACTURE Bilateral 06/11/2018   Procedure: CLOSED REDUCTION NASAL FRACTURE;  Surgeon: Edda Mt, MD;  Location: [facility];  Service: ENT;  Laterality: Bilateral;   COLONOSCOPY  2015   CORONARY ANGIOPLASTY     2015 stent   CORONARY STENT INTERVENTION N/A 06/08/2019   Procedure: CORONARY STENT INTERVENTION;  Surgeon: Florencio Cara BIRCH, MD;  Location: [facility];  Service: Cardiovascular;  Laterality: N/A;   CYSTOSCOPY  1984   EYE SURGERY Bilateral 2005,2006,2009   detached retina, cataract   FOOT ARTHRODESIS Left 05/30/2015   Procedure: ARTHRODESIS FOOT STJ LT FOOT;  Surgeon: Eva Gay, DPM;  Location: [facility];  Service: Podiatry;  Laterality: Left;  WITH POPLITEAL BLOCK   FOOT SURGERY Left    HERNIA REPAIR Right 1969   inguinal   IR GASTROSTOMY TUBE MOD SED  09/22/2023   LEFT HEART CATH AND CORONARY ANGIOGRAPHY Left 06/08/2019   Procedure: LEFT HEART CATH AND CORONARY ANGIOGRAPHY;  Surgeon: Florencio Cara BIRCH, MD;  Location: [facility];  Service: Cardiovascular;  Laterality: Left;   SEPTOPLASTY Bilateral 06/11/2018   Procedure: SEPTOPLASTY;  Surgeon: Edda Mt, MD;  Location: [facility];  Service: ENT;  Laterality: Bilateral;   SHOULDER SURGERY Right 2004   skull surgery     TOE SURGERY Right 2009   TONSILLECTOMY  2008   TRACHEOSTOMY TUBE PLACEMENT N/A 09/23/2023   Procedure: CREATION, TRACHEOSTOMY;  Surgeon: Rumalda Massie RAMAN, MD;  Location: [facility];  Service: ENT;  Laterality: N/A;    Family Psychiatric History: Please see initial evaluation for full details. I have reviewed the history. No updates at this time.     Family History:  Family History  Problem Relation Age of Onset   Asperger's syndrome Mother     Social History:   Social History   Socioeconomic History   Marital status: Married    Spouse name: cindy   Number of children: 1   Years of education: Not on file   Highest education level: Professional school degree (e.g., MD, DDS, DVM, JD)  Occupational History   Not on file  Tobacco Use   Smoking status: Never   Smokeless tobacco: Never  Vaping Use   Vaping status: Never Used  Substance and Sexual Activity   Alcohol use: No   Drug use: No   Sexual activity: Not Currently    Birth control/protection:  None  Other Topics Concern   Not on file  Social History Narrative   Not on file   Social Drivers of Health   Tobacco Use: Low Risk (05/02/2024)   Patient History    Smoking Tobacco Use: Never    Smokeless Tobacco Use: Never    Passive Exposure: Not on file  Financial Resource Strain: Low Risk  (04/12/2024)   Received [facility]   Overall Financial Resource Strain (CARDIA)    Difficulty of Paying Living Expenses: Not hard at all  Food Insecurity: No Food Insecurity (04/12/2024)   Received from [facility]   Epic    Within the past 12 months, you worried that your food would run out before you got the money to buy more.: Never true    Within the past 12 months, the food you bought just didn't last and you didn't have money to get more.: Never true  Transportation Needs: No Transportation Needs (04/12/2024)   Received from [facility] - Transportation    In the past 12 months, has lack of transportation kept you from medical appointments or from getting medications?: No    Lack of Transportation (Non-Medical): No  Physical Activity: Not on file  Stress: Not on file  Social Connections: Patient Unable To Answer (09/15/2023)   Social Connection and Isolation Panel    Frequency of Communication with Friends and Family: Patient unable to answer    Frequency of Social Gatherings with Friends and Family: Patient unable to  answer    Attends Religious Services: Patient unable to answer    Active Member of Clubs or Organizations: Patient unable to answer    Attends Banker Meetings: Patient unable to answer    Marital Status: Patient unable to answer  Depression (PHQ2-9): Low Risk (08/04/2023)   Depression (PHQ2-9)    PHQ-2 Score: 0  Alcohol Screen: Not on file  Housing: Unknown (04/12/2024)   Received from [facility]   Epic    In the last 12 months, was there a time when you were not able to pay the mortgage or rent on time?: No    Number of Times Moved in the Last Year: Not on file    At any time in the past 12 months, were you homeless or living in a shelter (including now)?: No  Utilities: Not At Risk (04/12/2024)   Received from [facility]   Epic    In the past 12 months has the electric, gas, oil, or water  company threatened to shut off services in your home?: No  Health Literacy: Not on file    Allergies: Allergies[1]  Metabolic Disorder Labs: No results found for: HGBA1C, MPG No results found for: PROLACTIN Lab Results  Component Value Date   CHOL 112 05/23/2014   TRIG 150 (H) 09/24/2023   HDL 41 05/23/2014   VLDL 16 05/23/2014   LDLCALC 55 05/23/2014   LDLCALC 124 (H) 09/13/2013   Lab Results  Component Value Date   TSH 1.658 09/15/2023   TSH 3.62 09/13/2013    Therapeutic Level Labs: No results found for: LITHIUM Lab Results  Component Value Date   VALPROATE 28 (L) 11/05/2023   VALPROATE 49 (L) 09/14/2023   No results found for: CBMZ  Current Medications: Current Outpatient Medications  Medication Sig Dispense Refill   acetaminophen  (TYLENOL ) 500 MG tablet Place 2 tablets (1,000 mg total) into feeding tube every 6 (  six) hours. 30 tablet 0   acetaminophen  (TYLENOL ) 500 MG tablet Take 2 tablets (1,000 mg total) by mouth every 6 (six) hours as needed. 100 tablet 2   amiodarone  (PACERONE ) 400 MG tablet Place 1 tablet  (400 mg total) into feeding tube daily.     atorvastatin  (LIPITOR) 40 MG tablet Place 1 tablet (40 mg total) into feeding tube daily.     [START ON 05/28/2024] divalproex  (DEPAKOTE  ER) 250 MG 24 hr tablet Take 1 tablet (250 mg total) by mouth daily. Total of 750 mg daily. Take along with 500 mg tab 90 tablet 0   [START ON 05/22/2024] divalproex  (DEPAKOTE  ER) 500 MG 24 hr tablet Take 1 tablet (500 mg total) by mouth daily. Total of 750 mg daily. Take along with 250 mg tab 90 tablet 0   docusate (COLACE) 50 MG/5ML liquid Place 10 mLs (100 mg total) into feeding tube 2 (two) times daily. 100 mL 0   DULoxetine  (CYMBALTA ) 60 MG capsule Take 1 capsule (60 mg total) by mouth 2 (two) times daily. 180 capsule 1   enoxaparin  (LOVENOX ) 40 MG/0.4ML injection Inject 0.4 mLs (40 mg total) into the skin daily.     fentaNYL  (SUBLIMAZE ) 50 MCG/ML injection Inject 1 mL (50 mcg total) into the vein every 3 (three) hours as needed (to maintain RASS & CPOT goal.).     furosemide  (LASIX ) 20 MG tablet Place 1 tablet (20 mg total) into feeding tube daily.     heparin  25000 UT/250ML infusion Inject 1,900 Units/hr into the vein continuous.     latanoprost  (XALATAN ) 0.005 % ophthalmic solution Place 1 drop into the left eye at bedtime. 2.5 mL 12   losartan  (COZAAR ) 25 MG tablet Place 1 tablet (25 mg total) into feeding tube daily.     losartan  (COZAAR ) 50 MG tablet Place 1 tablet (50 mg total) into feeding tube daily.     metoprolol  tartrate (LOPRESSOR ) 25 MG tablet Place 1 tablet (25 mg total) into feeding tube 2 (two) times daily.     metoprolol  tartrate (LOPRESSOR ) 50 MG tablet Place 1 tablet (50 mg total) into feeding tube 2 (two) times daily.     Mouthwashes (MOUTH RINSE) LIQD solution 15 mLs by Mouth Rinse route every 2 (two) hours.     Mouthwashes (MOUTH RINSE) LIQD solution 15 mLs by Mouth Rinse route as needed (oral care).     naphazoline-glycerin  (CLEAR EYES REDNESS) 0.012-0.25 % SOLN Place 1-2 drops into both eyes 4  (four) times daily as needed for eye irritation.     Nutritional Supplements (FEEDING SUPPLEMENT, VITAL 1.5 CAL,) LIQD Place 1,000 mLs into feeding tube continuous.     oxyCODONE  (OXY IR/ROXICODONE ) 5 MG immediate release tablet Place 1 tablet (5 mg total) into feeding tube every 6 (six) hours. 30 tablet 0   pantoprazole  (PROTONIX ) 40 MG injection Inject 40 mg into the vein at bedtime.     piperacillin -tazobactam (ZOSYN ) 3.375 GM/50ML IVPB Inject 50 mLs (3.375 g total) into the vein every 8 (eight) hours.     polyethylene glycol (MIRALAX  / GLYCOLAX ) 17 g packet Place 17 g into feeding tube 2 (two) times daily. 14 each 0   prednisoLONE  acetate (PRED FORTE ) 1 % ophthalmic suspension Place 1 drop into the right eye daily. 5 mL 0   Protein (FEEDING SUPPLEMENT, PROSOURCE TF20,) liquid Place 60 mLs into feeding tube daily.     [START ON 06/14/2024] QUEtiapine  (SEROQUEL ) 50 MG tablet Take 1 tablet (50 mg total) by mouth  at bedtime. 90 tablet 0   silodosin  (RAPAFLO ) 8 MG CAPS capsule TAKE 1 CAPSULE BY MOUTH DAILY WITH BREAKFAST 90 capsule 3   spironolactone  (ALDACTONE ) 25 MG tablet Place 0.5 tablets (12.5 mg total) into feeding tube daily.     spironolactone  (ALDACTONE ) 25 MG tablet Place 1 tablet (25 mg total) into feeding tube daily.     tadalafil  (CIALIS ) 5 MG tablet Take 1 tablet by mouth once daily 90 tablet 0   valproic  acid (DEPAKENE ) 250 MG/5ML solution Place 5 mLs (250 mg total) into feeding tube 3 (three) times daily.     No current facility-administered medications for this visit.     Musculoskeletal: Strength & Muscle Tone: within normal limits Gait & Station: normal Patient leans: N/A  Psychiatric Specialty Exam: Review of Systems  Psychiatric/Behavioral:  Positive for sleep disturbance. Negative for agitation, behavioral problems, confusion, decreased concentration, dysphoric mood, hallucinations, self-injury and suicidal ideas. The patient is not nervous/anxious and is not  hyperactive.   All other systems reviewed and are negative.   Blood pressure 121/78, pulse 72, temperature (!) 97.4 F (36.3 C), temperature source Temporal, height 5' 7 (1.702 m), weight 190 lb (86.2 kg).Body mass index is 29.76 kg/m.  General Appearance: Well Groomed  Eye Contact:  Good  Speech:  Clear and Coherent  Volume:  Normal  Mood:  good  Affect:  Appropriate, Congruent, and calm  Thought Process:  Coherent, tangential at times  Orientation:  Full (Time, Place, and Person)  Thought Content: Logical   Suicidal Thoughts:  No  Homicidal Thoughts:  No  Memory:  Immediate;   Good  Judgement:  Good  Insight:  Fair  Psychomotor Activity:  Normal, Normal tone, no rigidity, no resting/postural tremors, no tardive dyskinesia    Concentration:  Concentration: Good and Attention Span: Good  Recall:  Good  Fund of Knowledge: Good  Language: Good  Akathisia:  No  Handed:  Right  AIMS (if indicated): 0   Assets:  Communication Skills Desire for Improvement  ADL's:  Intact  Cognition: WNL  Sleep:  Good   Screenings: GAD-7    Flowsheet Row [facility] Visit from [facility]/04/20[facility] in [facility]  Total GAD-7 Score 0   PHQ2-9    Flowsheet Row Office Visit from 08/04/2023 in [facility]  PHQ-2 Total Score 0   Flowsheet Row ED from 11/29/2023 in [facility] at [facility] ED to Hosp-Admission (Discharged) from 09/14/2023 in [facility]U Office Visit from 08/04/2023 in [facility]  C-SSRS RISK CATEGORY No Risk No Risk No Risk     Assessment and Plan:  SHAWNTA ZIMBELMAN [name] is a 74 y.o. year old male with a history of TBI, hypertension, bradycardia s/p ICD, SVT, CAD, CHF, ischemic cardiomyopathy, hyperlipidemia, OSA on oxygen, hypogonadism on testosterone , COVID infection in Sept 2023, who presents for follow up appointment  for below.  1. Traumatic brain injury with loss of consciousness, subsequent encounter Functional Status   IADL: Independent in the following: managing:            Requires assistance with the following: cooking, medications ADL  Independent in the following: bathing and hygiene, feeding, continence, grooming and toileting, walking          Requires assistance with the following: Folate, Vitamin B12, TSH Images: Head CT 12/2023 Brain: Brain: Ventricles and cisterns within normal and unchanged. Bifrontal temporal encephalomalacia unchanged. Chronic ischemic microvascular  disease is present. No mass, mass effect, shift of midline structures or acute hemorrhage. Old left cerebellar infarct Neuropsych assessment: reportedly had neuropsych testing, but unable to locate this Etiology:   He is calm, and alert through the entire visit., although he occasionally demonstrates tangential thought process.  According to the collateral from his wife, he has been stable without significant outburst, behavior issues since being on the current medication regimen.  Will continue current dose of Depakote  to target emotional dysregulation associated with TBI, along with quetiapine  for the same. Discussed potential metabolic side effect, EPS, QTc prolongation, and risk of increased mortality for people with dementia.  Noted that he has chronic increase in appetite, and eating others' food, which might be more associated with disinhibition secondary to TBI.  Will continue to monitor this and consider lowering the dose of quetiapine  if needed.   2. MDD (major depressive disorder), recurrent, in partial remission Stable.  He reports good benefit from duloxetine .  Will continue the current dose to target depression.   3. High risk medication use We will recheck Depakote , platelet given he is on Depakote .        Last checked  EKG HR 88, QTc473msec Sinus rhythm, ventricular bigeminy 08/2023  Lipid panels LDL 142 H   11/2021- Due  HbA1c Glu 118  09/2023   VPA 28 10/2023, CBC wnl except RBC 4.13 11/2023, LFT wnl 12/2023   Plan Continue Depakote  ER 750 mg daily  Continue duloxetine  60 mg twice a day Continue Quetiapine  50 mg at night - monitor hyperphagia/disinhibition Obtain labs (VPA, CBC, CMP) at [facility] Next appointment-  3/10 at 11 AM, IP - on melatonin 10 mg    The patient demonstrates the following risk factors for suicide: Chronic risk factors for suicide include: psychiatric disorder of TBI, depression. Acute risk factors for suicide include: unemployment and loss (financial, interpersonal, professional). Protective factors for this patient include: positive social support. Considering these factors, the overall suicide risk at this point appears to be low. Patient is appropriate for outpatient follow up.   Collaboration of Care: Collaboration of Care: Other reviewed notes in Epic  Patient/Guardian was advised Release of Information must be obtained prior to any record release in order to collaborate their care with an outside provider. Patient/Guardian was advised if they have not already done so to contact the registration department to sign all necessary forms in order for us  to release information regarding their care.   Consent: Patient/Guardian gives verbal consent for treatment and assignment of benefits for services provided during this visit. Patient/Guardian expressed understanding and agreed to proceed.    Katheren Sleet, MD 05/02/2024, 5:23 PM     [1]  Allergies Allergen Reactions   Promethazine Other (See Comments)    Severe neuroleptic malignant syndrome requiring intubation   Mirtazapine Other (See Comments)    Other reaction(s): Other (See Comments) Irritability/anger, increased appetite, worsening depression Irritability/anger, increased appetite, worsening depression

## 2024-05-02 ENCOUNTER — Encounter: Payer: Self-pay | Admitting: Psychiatry

## 2024-05-02 ENCOUNTER — Other Ambulatory Visit: Payer: Self-pay

## 2024-05-02 ENCOUNTER — Ambulatory Visit: Admitting: Psychiatry

## 2024-05-02 VITALS — BP 121/78 | HR 72 | Temp 97.4°F | Ht 67.0 in | Wt 190.0 lb

## 2024-05-02 DIAGNOSIS — S069X9D Unspecified intracranial injury with loss of consciousness of unspecified duration, subsequent encounter: Secondary | ICD-10-CM

## 2024-05-02 DIAGNOSIS — F3341 Major depressive disorder, recurrent, in partial remission: Secondary | ICD-10-CM

## 2024-05-02 DIAGNOSIS — Z79899 Other long term (current) drug therapy: Secondary | ICD-10-CM | POA: Diagnosis not present

## 2024-05-02 MED ORDER — DIVALPROEX SODIUM ER 250 MG PO TB24: 250.0000 mg | ORAL_TABLET | Freq: Every day | ORAL | 0 refills | Status: AC

## 2024-05-02 MED ORDER — DIVALPROEX SODIUM ER 500 MG PO TB24: 500.0000 mg | ORAL_TABLET | Freq: Every day | ORAL | 0 refills | Status: AC

## 2024-05-02 MED ORDER — QUETIAPINE FUMARATE 50 MG PO TABS: 50.0000 mg | ORAL_TABLET | Freq: Every day | ORAL | 0 refills | Status: AC

## 2024-05-02 MED ORDER — DULOXETINE HCL 60 MG PO CPEP: 60.0000 mg | ORAL_CAPSULE | Freq: Two times a day (BID) | ORAL | 1 refills | Status: AC

## 2024-05-02 NOTE — Patient Instructions (Signed)
 Continue Depakote  ER 750 mg daily  Continue duloxetine  60 mg twice a day Continue Quetiapine  50 mg at night Obtain labs (VPA, CBC, CMP) at [facility] Next appointment-  3/10 at 11 AM

## 2024-05-05 ENCOUNTER — Other Ambulatory Visit
Admission: RE | Admit: 2024-05-05 | Discharge: 2024-05-05 | Disposition: A | Discharge status: Home / Self Care | Source: Ambulatory Visit | Attending: Psychiatry | Admitting: Psychiatry

## 2024-05-05 DIAGNOSIS — Z79899 Other long term (current) drug therapy: Secondary | ICD-10-CM | POA: Diagnosis present

## 2024-05-05 LAB — CBC
HCT: 37.3 % — ABNORMAL LOW (ref 39.0–52.0)
Hemoglobin: 12.7 g/dL — ABNORMAL LOW (ref 13.0–17.0)
MCH: 31.9 pg (ref 26.0–34.0)
MCHC: 34 g/dL (ref 30.0–36.0)
MCV: 93.7 fL (ref 80.0–100.0)
Platelets: 290 K/uL (ref 150–400)
RBC: 3.98 MIL/uL — ABNORMAL LOW (ref 4.22–5.81)
RDW: 14 % (ref 11.5–15.5)
WBC: 6 K/uL (ref 4.0–10.5)
nRBC: 0 % (ref 0.0–0.2)

## 2024-05-05 LAB — COMPREHENSIVE METABOLIC PANEL WITH GFR
ALT: 9 U/L (ref 0–44)
AST: 18 U/L (ref 15–41)
Albumin: 3.9 g/dL (ref 3.5–5.0)
Alkaline Phosphatase: 71 U/L (ref 38–126)
Anion gap: 9 (ref 5–15)
BUN: 16 mg/dL (ref 8–23)
CO2: 28 mmol/L (ref 22–32)
Calcium: 9.2 mg/dL (ref 8.9–10.3)
Chloride: 102 mmol/L (ref 98–111)
Creatinine, Ser: 1.42 mg/dL — ABNORMAL HIGH (ref 0.61–1.24)
GFR, Estimated: 52 mL/min — ABNORMAL LOW
Glucose, Bld: 108 mg/dL — ABNORMAL HIGH (ref 70–99)
Potassium: 4.3 mmol/L (ref 3.5–5.1)
Sodium: 139 mmol/L (ref 135–145)
Total Bilirubin: 0.3 mg/dL (ref 0.0–1.2)
Total Protein: 6.5 g/dL (ref 6.5–8.1)

## 2024-05-05 LAB — VALPROIC ACID LEVEL: Valproic Acid Lvl: 45 ug/mL — ABNORMAL LOW (ref 50–100)

## 2024-05-06 ENCOUNTER — Ambulatory Visit: Payer: Self-pay | Admitting: Psychiatry

## 2024-05-06 NOTE — Progress Notes (Signed)
 Please let them/his wife know that the labs show mild anemia and a worsening in kidney function. Please advise to follow up with his primary care provider regarding these findings. His Depakote  level is slightly below the therapeutic range; however, we will continue the current dose for now as long as he remains clinically stable.

## 2024-05-09 ENCOUNTER — Telehealth: Payer: Self-pay | Admitting: *Deleted

## 2024-05-09 NOTE — Telephone Encounter (Signed)
 Patient wife called in and asked about his labs that was done at [facility] on 05/05/2024 . She would like you to look at his kidney function. Blake [name] may need to have surgery per wife .

## 2024-05-09 NOTE — Telephone Encounter (Signed)
 Kidney function has decreased.  This is most commonly due to either dehydration or abnormalities in the filtering of the kidney and not anything urologic in origin.  Less commonly obstruction of the kidney can cause this however he had a CT scan in August which showed no evidence of kidney blockage or stones.  Abnormal kidney function is usually evaluated by a nephrologist.

## 2024-05-10 ENCOUNTER — Ambulatory Visit: Admitting: Physician Assistant

## 2024-05-10 NOTE — Telephone Encounter (Signed)
 Notified patient wife  as instructed, patient wife pleased.

## 2024-05-27 ENCOUNTER — Ambulatory Visit: Admitting: Anesthesiology

## 2024-05-27 ENCOUNTER — Other Ambulatory Visit: Payer: Self-pay

## 2024-05-27 ENCOUNTER — Ambulatory Visit
Admission: RE | Admit: 2024-05-27 | Discharge: 2024-05-27 | Disposition: A | Source: Home / Self Care | Attending: Gastroenterology | Admitting: Gastroenterology

## 2024-05-27 ENCOUNTER — Encounter: Payer: Self-pay | Admitting: Gastroenterology

## 2024-05-27 ENCOUNTER — Encounter: Admission: RE | Disposition: A | Payer: Self-pay | Source: Home / Self Care | Attending: Gastroenterology

## 2024-05-27 MED ORDER — LIDOCAINE HCL (PF) 2 % IJ SOLN
INTRAMUSCULAR | Status: AC
  Filled 2024-05-27: qty 5

## 2024-05-27 MED ORDER — SODIUM CHLORIDE 0.9 % IV SOLN: INTRAVENOUS | Status: DC

## 2024-05-27 MED ORDER — LIDOCAINE HCL (CARDIAC) PF 100 MG/5ML IV SOSY
PREFILLED_SYRINGE | INTRAVENOUS | Status: DC | PRN
  Administered 2024-05-27: 60 mg via INTRAVENOUS

## 2024-05-27 MED ORDER — PROPOFOL 500 MG/50ML IV EMUL
INTRAVENOUS | Status: DC | PRN
  Administered 2024-05-27: 50 ug/kg/min via INTRAVENOUS

## 2024-05-27 MED ORDER — PROPOFOL 10 MG/ML IV BOLUS
INTRAVENOUS | Status: DC | PRN
  Administered 2024-05-27 (×3): 20 mg via INTRAVENOUS

## 2024-05-27 NOTE — Op Note (Signed)
 [facility] Gastroenterology Patient Name: Blake [name] Procedure Date: 05/27/2024 9:32 AM MRN: [id number] Account #: [id number] Date of Birth: [birth year] Admit Type: Outpatient Age: 74 Room: [facility] ENDO ROOM 1 Gender: Male Note Status: Finalized Instrument Name: Colon Scope [id number] Procedure:             Colonoscopy Indications:           Rectal bleeding Providers:             Ruel Kung MD, MD Referring MD:          Alm HERO. Glover, MD (Referring MD) Medicines:             Monitored Anesthesia Care Complications:         No immediate complications. Procedure:             Pre-Anesthesia Assessment:                        - Prior to the procedure, a History and Physical was                         performed, and patient medications, allergies and                         sensitivities were reviewed. The patient's tolerance                         of previous anesthesia was reviewed.                        - The risks and benefits of the procedure and the                         sedation options and risks were discussed with the                         patient. All questions were answered and informed                         consent was obtained.                        - ASA Grade Assessment: II - A patient with mild                         systemic disease.                        After obtaining informed consent, the colonoscope was                         passed under direct vision. Throughout the procedure,                         the patient's blood pressure, pulse, and oxygen                         saturations were monitored continuously. The                         Colonoscope was introduced through  the anus and                         advanced to the the cecum, identified by the                         appendiceal orifice. The colonoscopy was performed                         with ease. The patient tolerated the procedure well.                         The  quality of the bowel preparation was excellent.                         The ileocecal valve, appendiceal orifice, and rectum                         were photographed. Findings:      The perianal and digital rectal examinations were normal.      A 5 mm polyp was found in the transverse colon. The polyp was sessile.       The polyp was removed with a cold snare. Resection and retrieval were       complete.      A 5 mm polyp was found in the descending colon. The polyp was sessile.       The polyp was removed with a cold snare. Resection and retrieval were       complete. To prevent bleeding after the polypectomy, one hemostatic clip       was successfully placed. Clip manufacturer: [employer]. There was       no bleeding at the end of the procedure.      Internal hemorrhoids were found during retroflexion. The hemorrhoids       were medium-sized and Grade I (internal hemorrhoids that do not       prolapse).      The exam was otherwise without abnormality on direct and retroflexion       views. Impression:            - One 5 mm polyp in the transverse colon, removed with                         a cold snare. Resected and retrieved.                        - One 5 mm polyp in the descending colon, removed with                         a cold snare. Resected and retrieved. Clip was placed.                         Clip manufacturer: [employer].                        - Internal hemorrhoids.                        - The examination was otherwise normal on direct and  retroflexion views. Recommendation:        - Discharge patient to home.                        - Resume previous diet.                        - Continue present medications.                        - Await pathology results.                        - Repeat colonoscopy is not recommended due to current                         age (26 years or older) for surveillance.                        -  Return to GI office in 1 week. Procedure Code(s):     --- Professional ---                        [phone], Colonoscopy, flexible; with removal of                         tumor(s), polyp(s), or other lesion(s) by snare                         technique Diagnosis Code(s):     --- Professional ---                        D12.3, Benign neoplasm of transverse colon (hepatic                         flexure or splenic flexure)                        D12.4, Benign neoplasm of descending colon                        K64.0, First degree hemorrhoids                        K62.5, Hemorrhage of anus and rectum CPT copyright 2022 [employer]. All rights reserved. The codes documented in this report are preliminary and upon coder review may  be revised to meet current compliance requirements. Ruel Kung, MD Ruel Kung MD, MD 05/27/2024 10:10:50 AM This report has been signed electronically. Number of Addenda: 0 Note Initiated On: 05/27/2024 9:32 AM Scope Withdrawal Time: 0 hours 12 minutes 0 seconds  Total Procedure Duration: 0 hours 15 minutes 52 seconds  Estimated Blood Loss:  Estimated blood loss: none.      [facility]

## 2024-05-27 NOTE — Anesthesia Postprocedure Evaluation (Signed)
"   Anesthesia Post Note  Patient: Blake [name]  Procedure(s) Performed: COLONOSCOPY COLONOSCOPY, WITH POLYPECTOMY CONTROL OF HEMORRHAGE, GI TRACT, ENDOSCOPIC, BY CLIPPING OR OVERSEWING  Patient location during evaluation: PACU Anesthesia Type: General Level of consciousness: awake Pain management: satisfactory to patient Vital Signs Assessment: post-procedure vital signs reviewed and stable Respiratory status: spontaneous breathing Cardiovascular status: stable Anesthetic complications: no   No notable events documented.   Last Vitals:  Vitals:   05/27/24 1021 05/27/24 1030  BP: 113/72 125/73  Pulse: 72 67  Resp: 16 15  Temp:    SpO2: 96% 98%    Last Pain:  Vitals:   05/27/24 1030  TempSrc:   PainSc: 0-No pain                 VAN STAVEREN,Arrin Pintor      "

## 2024-05-27 NOTE — Anesthesia Preprocedure Evaluation (Addendum)
 "                                  Anesthesia Evaluation  Patient identified by MRN, date of birth, ID band Patient awake    Reviewed: Allergy & Precautions, NPO status , Patient's Chart, lab work & pertinent test results  Airway Mallampati: II  TM Distance: >3 FB Neck ROM: full    Dental  (+) Teeth Intact, Caps   Pulmonary neg pulmonary ROS, shortness of breath S/p tracheostomy x 2   Pulmonary exam normal  + decreased breath sounds      Cardiovascular Exercise Tolerance: Good hypertension, + CAD, + Past MI, + Cardiac Stents and +CHF  Normal cardiovascular exam Rhythm:Regular Rate:Normal     Neuro/Psych Seizures -, Well Controlled,    Depression   Dementia CVA negative neurological ROS  negative psych ROS   GI/Hepatic negative GI ROS, Neg liver ROS,GERD  ,,  Endo/Other  negative endocrine ROS    Renal/GU negative Renal ROS  negative genitourinary   Musculoskeletal  (+) Arthritis ,    Abdominal Normal abdominal exam  (+)   Peds negative pediatric ROS (+)  Hematology negative hematology ROS (+)   Anesthesia Other Findings Past Medical History: 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 No date: Asperger's syndrome     Comment:  (per wife) 07/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 09/21/2013: CAD (coronary artery disease) No date: CHF (congestive heart failure) (HCC) 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 04/03/2016: Chronic left shoulder pain 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/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 03/06/2016: Cognitive deficit as late effect of traumatic brain injury No date: Coronary artery disease No date: Dementia (HCC) No date: Depression 07/11/2013: Disorder of male genital organs No date: Dyspnea 11/21/2014: Encounter for long-term (current) use of medications 07/11/2013: Erectile dysfunction 03/06/2016: Executive function deficit No date: GERD (gastroesophageal reflux disease) No date: Headache     Comment:  stress No date: Hearing loss No  date: Hyperlipidemia No date: Hypertension 07/11/2013: Hypogonadism, 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: Hypotestosteronism No date: Injury of frontal lobe (HCC)     Comment:  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 03/06/2016: Major depression, recurrent, chronic No date: Mild cognitive impairment     Comment:  s/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 02/02/2014: Mixed hyperlipidemia 08/2013: Myocardial infarction (HCC) No date: OCD (obsessive compulsive disorder) 04/20/2015: Other retinal detachments 07/11/2013: Other specified disorder of male genital organs(608.89) No date: Prostatic hypertrophy 04/20/2015: Pseudophakia of both eyes No date: Raynaud's disease 07/11/2013: Reduced libido No date: Repeated falls     Comment:  weak left ankle No date: Scoliosis No date: Seizures (HCC)     Comment:  1 time only years ago 05/2014: Stroke [facility])     Comment:  light No date: Synovitis     Comment:  knees, ankles No date: TBI (traumatic brain injury) (HCC)     Comment:  hx 4 TBI's. last one was 07/2022. from a fall.  Past Surgical History: 2011: BACK SURGERY     Comment:  rods and screws 06/11/2018: CLOSED REDUCTION NASAL FRACTURE; Bilateral     Comment:  Procedure: CLOSED REDUCTION NASAL FRACTURE;  Surgeon:               Edda Mt, MD;  Location: [facility] ORS;  Service: ENT;                Laterality: Bilateral; 2015: COLONOSCOPY No date: CORONARY ANGIOPLASTY     Comment:  2015 stent 06/08/2019: CORONARY STENT INTERVENTION;  N/A     Comment:  Procedure: CORONARY STENT INTERVENTION;  Surgeon:               Florencio Cara BIRCH, MD;  Location: [facility] INVASIVE CV LAB;               Service: Cardiovascular;  Laterality: N/A; 1984: CYSTOSCOPY 2005,2006,2009: EYE SURGERY; Bilateral     Comment:  detached retina, cataract 05/30/2015: FOOT ARTHRODESIS; Left     Comment:  Procedure: ARTHRODESIS FOOT STJ LT FOOT;  Surgeon:               Eva Gay, DPM;  Location: [facility];                Service: Podiatry;   Laterality: Left;  WITH POPLITEAL               BLOCK No date: FOOT SURGERY; Left 1969: HERNIA REPAIR; Right     Comment:  inguinal 09/22/2023: IR GASTROSTOMY TUBE MOD SED 06/08/2019: LEFT HEART CATH AND CORONARY ANGIOGRAPHY; Left     Comment:  Procedure: LEFT HEART CATH AND CORONARY ANGIOGRAPHY;                Surgeon: Florencio Cara BIRCH, MD;  Location: [facility] INVASIVE              CV LAB;  Service: Cardiovascular;  Laterality: Left; 06/11/2018: SEPTOPLASTY; Bilateral     Comment:  Procedure: SEPTOPLASTY;  Surgeon: Edda Mt, MD;                Location: [facility] ORS;  Service: ENT;  Laterality:               Bilateral; 2004: SHOULDER SURGERY; Right No date: skull surgery 2009: TOE SURGERY; Right 2008: TONSILLECTOMY 09/23/2023: TRACHEOSTOMY TUBE PLACEMENT; N/A     Comment:  Procedure: CREATION, TRACHEOSTOMY;  Surgeon: Rumalda Massie RAMAN, MD;  Location: [facility] ORS;  Service: ENT;                Laterality: N/A;  BMI    Body Mass Index: 30.62 kg/m      Reproductive/Obstetrics negative OB ROS                              Anesthesia Physical Anesthesia Plan  ASA: 4  Anesthesia Plan: General   Post-op Pain Management:    Induction: Intravenous  PONV Risk Score and Plan: Propofol  infusion and TIVA  Airway Management Planned: Natural Airway and Nasal Cannula  Additional Equipment:   Intra-op Plan:   Post-operative Plan:   Informed Consent: I have reviewed the patients History and Physical, chart, labs and discussed the procedure including the risks, benefits and alternatives for the proposed anesthesia with the patient or authorized representative who has indicated his/her understanding and acceptance.     Dental Advisory Given  Plan Discussed with: CRNA  Anesthesia Plan Comments:          Anesthesia Quick Evaluation  "

## 2024-05-27 NOTE — Transfer of Care (Signed)
 Immediate Anesthesia Transfer of Care Note  Patient: Norleen FORBES Files III  Procedure(s) Performed: COLONOSCOPY COLONOSCOPY, WITH POLYPECTOMY CONTROL OF HEMORRHAGE, GI TRACT, ENDOSCOPIC, BY CLIPPING OR OVERSEWING  Patient Location: PACU  Anesthesia Type:General  Level of Consciousness: sedated  Airway & Oxygen Therapy: Patient Spontanous Breathing  Post-op Assessment: Report given to RN and Post -op Vital signs reviewed and stable  Post vital signs: Reviewed and stable  Last Vitals:  Vitals Value Taken Time  BP 101/68 05/27/24 10:12  Temp 35.7 C 05/27/24 10:11  Pulse 71 05/27/24 10:12  Resp 18 05/27/24 10:12  SpO2 97 % 05/27/24 10:12  Vitals shown include unfiled device data.  Last Pain:  Vitals:   05/27/24 1011  TempSrc: Temporal  PainSc:          Complications: No notable events documented.

## 2024-05-27 NOTE — H&P (Signed)
 "                                                                                                                           Ruel Kung , MD [address], [place], [place], [address] Phone: [phone] Fax: [phone]  Primary Care Physician:  Glover Lenis, MD   Pre-Procedure History & Physical: HPI:  Blake [name] is a 74 y.o. male is here for an colonoscopy.   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   Coronary artery disease    Dementia (HCC)    Depression    Disorder of male genital organs 07/11/2013   Dyspnea    Encounter for long-term (current) use of medications 11/21/2014   Erectile dysfunction 07/11/2013   Executive function deficit 03/06/2016   GERD (gastroesophageal reflux disease)    Headache    stress   Hearing loss    Hyperlipidemia    Hypertension    Hypogonadism, 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   Major depression, recurrent, chronic 03/06/2016   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   Mixed hyperlipidemia 02/02/2014   Myocardial infarction (HCC) 08/2013   OCD (obsessive compulsive disorder)    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   Other specified disorder of male genital organs(608.89) 07/11/2013   Prostatic hypertrophy    Pseudophakia of both eyes 04/20/2015   Raynaud's disease    Reduced libido 07/11/2013   Repeated falls    weak left ankle   Scoliosis    Seizures (HCC)    1 time only years ago   Stroke (HCC) 05/2014    light   Synovitis    knees, ankles   TBI (traumatic brain injury) (HCC)    hx 4 TBI's. last one was 07/2022. from a fall.    Past Surgical History:  Procedure Laterality Date   BACK SURGERY  2011   rods and screws   CLOSED REDUCTION NASAL FRACTURE Bilateral 06/11/2018   Procedure: CLOSED REDUCTION NASAL FRACTURE;  Surgeon: Edda Mt, MD;  Location: [facility];  Service: ENT;  Laterality: Bilateral;   COLONOSCOPY  2015   CORONARY ANGIOPLASTY     2015 stent   CORONARY STENT INTERVENTION N/A 06/08/2019   Procedure: CORONARY STENT INTERVENTION;  Surgeon: Florencio Cara BIRCH, MD;  Location: [facility];  Service: Cardiovascular;  Laterality: N/A;   CYSTOSCOPY  1984   EYE SURGERY Bilateral 2005,2006,2009   detached retina,  cataract   FOOT ARTHRODESIS Left 05/30/2015   Procedure: ARTHRODESIS FOOT STJ LT FOOT;  Surgeon: Eva Gay, DPM;  Location: [facility];  Service: Podiatry;  Laterality: Left;  WITH POPLITEAL BLOCK   FOOT SURGERY Left    HERNIA REPAIR Right 1969   inguinal   IR GASTROSTOMY TUBE MOD SED  09/22/2023   LEFT HEART CATH AND CORONARY ANGIOGRAPHY Left 06/08/2019   Procedure: LEFT HEART CATH AND CORONARY ANGIOGRAPHY;  Surgeon: Florencio Cara BIRCH, MD;  Location: [facility];  Service: Cardiovascular;  Laterality: Left;   SEPTOPLASTY Bilateral 06/11/2018   Procedure: SEPTOPLASTY;  Surgeon: Edda Mt, MD;  Location: [facility];  Service: ENT;  Laterality: Bilateral;   SHOULDER SURGERY Right 2004   skull surgery     TOE SURGERY Right 2009   TONSILLECTOMY  2008   TRACHEOSTOMY TUBE PLACEMENT N/A 09/23/2023   Procedure: CREATION, TRACHEOSTOMY;  Surgeon: Rumalda Massie RAMAN, MD;  Location: [facility];  Service: ENT;  Laterality: N/A;    Prior to Admission medications  Medication Sig Start Date End Date Taking? Authorizing Provider  acetaminophen  (TYLENOL ) 500 MG tablet Place 2 tablets (1,000 mg total) into feeding tube every 6 (six) hours. 09/25/23   Kasa, Kurian,  MD  acetaminophen  (TYLENOL ) 500 MG tablet Take 2 tablets (1,000 mg total) by mouth every 6 (six) hours as needed. 11/29/23 11/28/24  Clarine Ozell LABOR, MD  amiodarone  (PACERONE ) 400 MG tablet Place 1 tablet (400 mg total) into feeding tube daily. 09/25/23   Kasa, Kurian, MD  atorvastatin  (LIPITOR) 40 MG tablet Place 1 tablet (40 mg total) into feeding tube daily. 09/25/23   Kasa, Kurian, MD  divalproex  (DEPAKOTE  ER) 250 MG 24 hr tablet Take 1 tablet (250 mg total) by mouth daily. Total of 750 mg daily. Take along with 500 mg tab 05/28/24 08/26/24  Vickey Mettle, MD  divalproex  (DEPAKOTE  ER) 500 MG 24 hr tablet Take 1 tablet (500 mg total) by mouth daily. Total of 750 mg daily. Take along with 250 mg tab 05/22/24 08/20/24  Vickey Mettle, MD  docusate (COLACE) 50 MG/5ML liquid Place 10 mLs (100 mg total) into feeding tube 2 (two) times daily. 09/25/23   Kasa, Kurian, MD  DULoxetine  (CYMBALTA ) 60 MG capsule Take 1 capsule (60 mg total) by mouth 2 (two) times daily. 05/02/24 10/29/24  Vickey Mettle, MD  enoxaparin  (LOVENOX ) 40 MG/0.4ML injection Inject 0.4 mLs (40 mg total) into the skin daily. 09/25/23   Kasa, Kurian, MD  fentaNYL  (SUBLIMAZE ) 50 MCG/ML injection Inject 1 mL (50 mcg total) into the vein every 3 (three) hours as needed (to maintain RASS & CPOT goal.). 09/29/23   Assaker, Darrin, MD  furosemide  (LASIX ) 20 MG tablet Place 1 tablet (20 mg total) into feeding tube daily. 09/30/23   Assaker, Darrin, MD  heparin  25000 UT/250ML infusion Inject 1,900 Units/hr into the vein continuous. 09/29/23   Assaker, Darrin, MD  latanoprost  (XALATAN ) 0.005 % ophthalmic solution Place 1 drop into the left eye at bedtime. 09/25/23   Kasa, Kurian, MD  losartan  (COZAAR ) 25 MG tablet Place 1 tablet (25 mg total) into feeding tube daily. 09/25/23   Kasa, Kurian, MD  losartan  (COZAAR ) 50 MG tablet Place 1 tablet (50 mg total) into feeding tube daily. 09/30/23   Assaker, Darrin, MD  metoprolol  tartrate (LOPRESSOR ) 25 MG tablet  Place 1 tablet (25 mg total) into feeding tube 2 (two) times daily. 09/25/23   Kasa, Kurian, MD  metoprolol  tartrate (LOPRESSOR ) 50 MG tablet Place  1 tablet (50 mg total) into feeding tube 2 (two) times daily. 09/29/23   Assaker, Darrin, MD  Mouthwashes (MOUTH RINSE) LIQD solution 15 mLs by Mouth Rinse route every 2 (two) hours. 09/25/23   Kasa, Kurian, MD  Mouthwashes (MOUTH RINSE) LIQD solution 15 mLs by Mouth Rinse route as needed (oral care). 09/29/23   Assaker, Darrin, MD  naphazoline-glycerin  (CLEAR EYES REDNESS) 0.012-0.25 % SOLN Place 1-2 drops into both eyes 4 (four) times daily as needed for eye irritation. 09/25/23   Kasa, Kurian, MD  Nutritional Supplements (FEEDING SUPPLEMENT, VITAL 1.5 CAL,) LIQD Place 1,000 mLs into feeding tube continuous. 09/25/23   Kasa, Kurian, MD  oxyCODONE  (OXY IR/ROXICODONE ) 5 MG immediate release tablet Place 1 tablet (5 mg total) into feeding tube every 6 (six) hours. 09/25/23   Kasa, Kurian, MD  pantoprazole  (PROTONIX ) 40 MG injection Inject 40 mg into the vein at bedtime. 09/25/23   Kasa, Kurian, MD  piperacillin -tazobactam (ZOSYN ) 3.375 GM/50ML IVPB Inject 50 mLs (3.375 g total) into the vein every 8 (eight) hours. 09/29/23   Assaker, Darrin, MD  polyethylene glycol (MIRALAX  / GLYCOLAX ) 17 g packet Place 17 g into feeding tube 2 (two) times daily. 09/25/23   Kasa, Kurian, MD  prednisoLONE  acetate (PRED FORTE ) 1 % ophthalmic suspension Place 1 drop into the right eye daily. 09/25/23   Kasa, Kurian, MD  Protein (FEEDING SUPPLEMENT, PROSOURCE TF20,) liquid Place 60 mLs into feeding tube daily. 09/25/23   Kasa, Kurian, MD  QUEtiapine  (SEROQUEL ) 50 MG tablet Take 1 tablet (50 mg total) by mouth at bedtime. 06/14/24 09/12/24  Vickey Mettle, MD  silodosin  (RAPAFLO ) 8 MG CAPS capsule TAKE 1 CAPSULE BY MOUTH DAILY WITH BREAKFAST 03/11/24   Stoioff, Glendia BROCKS, MD  spironolactone  (ALDACTONE ) 25 MG tablet Place 0.5 tablets (12.5 mg total) into feeding tube daily. 09/25/23   Kasa,  Kurian, MD  spironolactone  (ALDACTONE ) 25 MG tablet Place 1 tablet (25 mg total) into feeding tube daily. 09/30/23   Assaker, Darrin, MD  tadalafil  (CIALIS ) 5 MG tablet Take 1 tablet by mouth once daily 04/04/24   Stoioff, Scott C, MD  valproic  acid (DEPAKENE ) 250 MG/5ML solution Place 5 mLs (250 mg total) into feeding tube 3 (three) times daily. 09/25/23   Isaiah Scrivener, MD    Allergies as of 05/18/2024 - Review Complete 05/02/2024  Allergen Reaction Noted   Promethazine Other (See Comments) 09/15/2023   Mirtazapine Other (See Comments) 08/09/2019    Family History  Problem Relation Age of Onset   Asperger's syndrome Mother     Social History   Socioeconomic History   Marital status: Married    Spouse name: cindy   Number of children: 1   Years of education: Not on file   Highest education level: Professional school degree (e.g., MD, DDS, DVM, JD)  Occupational History   Not on file  Tobacco Use   Smoking status: Never   Smokeless tobacco: Never  Vaping Use   Vaping status: Never Used  Substance and Sexual Activity   Alcohol use: No   Drug use: No   Sexual activity: Not Currently    Birth control/protection: None  Other Topics Concern   Not on file  Social History Narrative   Not on file   Social Drivers of Health   Tobacco Use: Low Risk  (05/13/2024)   Received from [facility]   Patient History    Smoking Tobacco Use: Never    Smokeless Tobacco Use: Never  Passive Exposure: Never  Physicist, Medical Strain: Low Risk  (04/12/2024)   Received from [facility]   Overall Financial Resource Strain (CARDIA)    Difficulty of Paying Living Expenses: Not hard at all  Food Insecurity: No Food Insecurity (04/12/2024)   Received from [facility]   Epic    Within the past 12 months, you worried that your food would run out before you got the money to buy more.: Never true    Within the past 12 months, the food you  bought just didn't last and you didn't have money to get more.: Never true  Transportation Needs: No Transportation Needs (04/12/2024)   Received from [facility] - Transportation    In the past 12 months, has lack of transportation kept you from medical appointments or from getting medications?: No    Lack of Transportation (Non-Medical): No  Physical Activity: Not on file  Stress: Not on file  Social Connections: Patient Unable To Answer (09/15/2023)   Social Connection and Isolation Panel    Frequency of Communication with Friends and Family: Patient unable to answer    Frequency of Social Gatherings with Friends and Family: Patient unable to answer    Attends Religious Services: Patient unable to answer    Active Member of Clubs or Organizations: Patient unable to answer    Attends Banker Meetings: Patient unable to answer    Marital Status: Patient unable to answer  Intimate Partner Violence: Unknown (09/15/2023)   Humiliation, Afraid, Rape, and Kick questionnaire    Fear of Current or Ex-Partner: Patient unable to answer    Emotionally Abused: Patient unable to answer    Physically Abused: Not on file    Sexually Abused: Patient unable to answer  Depression (PHQ2-9): Low Risk (08/04/2023)   Depression (PHQ2-9)    PHQ-2 Score: 0  Alcohol Screen: Not on file  Housing: Unknown (04/12/2024)   Received from [facility]   Epic    In the last 12 months, was there a time when you were not able to pay the mortgage or rent on time?: No    Number of Times Moved in the Last Year: Not on file    At any time in the past 12 months, were you homeless or living in a shelter (including now)?: No  Utilities: Not At Risk (04/12/2024)   Received from [facility]   Epic    In the past 12 months has the electric, gas, oil, or water  company threatened to shut off services in your home?: No  Health Literacy: Not on file     Review of Systems: See HPI, otherwise negative ROS  Physical Exam: There were no vitals taken for this visit. General:   Alert,  pleasant and cooperative in NAD Head:  Normocephalic and atraumatic. Neck:  Supple; no masses or thyromegaly. Lungs:  Clear throughout to auscultation, normal respiratory effort.    Heart:  +S1, +S2, Regular rate and rhythm, No edema. Abdomen:  Soft, nontender and nondistended. Normal bowel sounds, without guarding, and without rebound.   Neurologic:  Alert and  oriented x4;  grossly normal neurologically.  Impression/Plan: Blake [name] is here for an colonoscopy to be performed for rectal bleeding.  Risks, benefits, limitations, and alternatives regarding  colonoscopy have been reviewed with the patient.  Questions have been answered.  All parties agreeable.   Ruel Kung, MD  05/27/2024, 8:46 AM  "

## 2024-06-28 ENCOUNTER — Ambulatory Visit: Admitting: Psychiatry
